# Patient Record
Sex: Female | Born: 1940
Health system: Southern US, Community
[De-identification: ages and names within clinical notes are randomized; demographics above are authoritative.]

## PROBLEM LIST (undated history)

## (undated) DIAGNOSIS — H269 Unspecified cataract: Secondary | ICD-10-CM

## (undated) DIAGNOSIS — R569 Unspecified convulsions: Secondary | ICD-10-CM

## (undated) DIAGNOSIS — F419 Anxiety disorder, unspecified: Secondary | ICD-10-CM

## (undated) DIAGNOSIS — F329 Major depressive disorder, single episode, unspecified: Secondary | ICD-10-CM

## (undated) DIAGNOSIS — M810 Age-related osteoporosis without current pathological fracture: Secondary | ICD-10-CM

## (undated) DIAGNOSIS — K5909 Other constipation: Secondary | ICD-10-CM

## (undated) DIAGNOSIS — F32A Depression, unspecified: Secondary | ICD-10-CM

## (undated) HISTORY — DX: Anxiety disorder, unspecified: F41.9

## (undated) HISTORY — PX: FISSURECTOMY: SHX5244

## (undated) HISTORY — DX: Unspecified convulsions: R56.9

## (undated) HISTORY — DX: Age-related osteoporosis without current pathological fracture: M81.0

## (undated) HISTORY — DX: Other constipation: K59.09

## (undated) HISTORY — DX: Unspecified cataract: H26.9

## (undated) HISTORY — PX: ESOPHAGOGASTRODUODENOSCOPY: SHX1529

## (undated) HISTORY — DX: Major depressive disorder, single episode, unspecified: F32.9

## (undated) HISTORY — DX: Depression, unspecified: F32.A

## (undated) HISTORY — PX: WRIST SURGERY: SHX841

---

## 1993-02-17 HISTORY — PX: ABDOMINAL HYSTERECTOMY: SHX81

## 2010-02-17 HISTORY — PX: COLONOSCOPY: SHX174

## 2014-08-24 ENCOUNTER — Ambulatory Visit (INDEPENDENT_AMBULATORY_CARE_PROVIDER_SITE_OTHER): Payer: Medicare Other | Admitting: Physician Assistant

## 2014-08-24 VITALS — BP 108/66 | HR 74 | Temp 97.8°F | Resp 16 | Ht 67.5 in | Wt 108.6 lb

## 2014-08-24 DIAGNOSIS — G4700 Insomnia, unspecified: Secondary | ICD-10-CM | POA: Insufficient documentation

## 2014-08-24 DIAGNOSIS — B0229 Other postherpetic nervous system involvement: Secondary | ICD-10-CM | POA: Diagnosis not present

## 2014-08-24 DIAGNOSIS — G40909 Epilepsy, unspecified, not intractable, without status epilepticus: Secondary | ICD-10-CM | POA: Diagnosis not present

## 2014-08-24 MED ORDER — MELOXICAM 7.5 MG PO TABS: 7.5000 mg | ORAL_TABLET | Freq: Every day | ORAL | Status: DC

## 2014-08-24 MED ORDER — LIDOCAINE 5 % EX PTCH: 1.0000 | MEDICATED_PATCH | CUTANEOUS | Status: DC

## 2014-08-24 NOTE — Patient Instructions (Signed)
Take tylenol 1000 mg three times a day. Take mobic once a day. Apply lidoderm patch to most painful area and leave in place for 12 hours. Remove and give yourself a 12 hour break before applying a new patch. Increase your klonopin to 3/4 - 1 pill at night to help you sleep. Return in 1 month for recheck.

## 2014-08-24 NOTE — Progress Notes (Signed)
[facility] [address], [place] [id number] [phone]  Date:  08/24/2014   Name:  Erin ChickBennie Penson   DOB:  [birth year]   MRN:  [id number]  PCP:  Albertina SenegalPOLLOCK, NELSON, MD    Chief Complaint: other   History of Present Illness:  This is a 74 y.o. female with PMH epilepsy who is presenting complaining of residual nerve pain since being dx'd with shingles on 08/01/14. She finished the valtrex and the rash has is resolving but she continues to have pain. Pain is located to left groin and extends to posterior left leg and left side of back. She is getting burning pain into her rectum and vagina. She states sitting down feels like she is "sitting on a nail". She is having a hard time staying asleep d/t the pain. She takes 0.25 - 0.5 mg klonopin at night normally to help her sleep. She is scared to take more than because she takes care of her husband with Parkinson's disease and she does not want to sleep to hard in case he calls out in the night. She saw her PCP who prescribed gabapentin for her nerve pain. She is very wary of taking this since she has a dx of epilepsy and already takes carbamazepine 300 mg QHS. Her epilepsy is very well controlled and she does not want to mess with her meds. In addition, she believes she took gabapentin in 2013 and she had side effects to it (insomnia). She states she cannot take opioids as they make her nauseated and vomit. She has been taking tylenol 2 gm per day for her current pain. Pt is very saddened by her current state - she states "I have always been able to take care of myself. Leaning on people for help is hard for me". She is accompanied today by neighbor.  Review of Systems:  Review of Systems See HPI.  There are no active problems to display for this patient.   Prior to Admission medications   Medication Sig Start Date End Date Taking? Authorizing Provider  carbamazepine (TEGRETOL) 200 MG tablet Take 300 mg by mouth at bedtime.    Yes Historical Provider, MD  clonazePAM (KLONOPIN) 1 MG tablet Take 1 mg by mouth at bedtime. Takes 1/2 of pill   Yes Historical Provider, MD  PEG 3350-KCl-Na Bicarb-NaCl (NULYTELY PO) Take 4 oz by mouth.   Yes Historical Provider, MD  Vitamin D, Ergocalciferol, (DRISDOL) 50000 UNITS CAPS capsule Take 50,000 Units by mouth every 7 (seven) days.   Yes Historical Provider, MD    Allergies  Allergen Reactions  . Other Nausea Only    Pain medications, Pt states all kinds of pain medications she is unable to take except tylenol    Past Surgical History  Procedure Laterality Date  . Abdominal hysterectomy      History  Substance Use Topics  . Smoking status: Current Every Day Smoker -- 1.00 packs/day    Types: Cigarettes  . Smokeless tobacco: Not on file  . Alcohol Use: No    History reviewed. No pertinent family history.  Medication list has been reviewed and updated.  Physical Examination:  Physical Exam  Constitutional: She is oriented to person, place, and time. She appears well-developed and well-nourished. No distress.  HENT:  Head: Normocephalic and atraumatic.  Right Ear: Hearing normal.  Left Ear: Hearing normal.  Nose: Nose normal.  Eyes: Conjunctivae and lids are normal. Right eye exhibits no discharge. Left eye exhibits no  discharge. No scleral icterus.  Cardiovascular: Normal rate, regular rhythm and normal pulses.   Pulmonary/Chest: Effort normal. No respiratory distress.  Musculoskeletal: Normal range of motion.  Neurological: She is alert and oriented to person, place, and time.  Skin: Skin is warm, dry and intact.  Scattered scabbed lesions over left groin, left buttock and left proximal posterior thigh  Psychiatric: She has a normal mood and affect. Her speech is normal and behavior is normal. Thought content normal.   BP 108/66 mmHg  Pulse 74  Temp(Src) 97.8 F (36.6 C) (Oral)  Resp 16  Ht 5' 7.5" (1.715 m)  Wt 108 lb 9.6 oz (49.261 kg)  BMI 16.75  kg/m2  SpO2 98%  Assessment and Plan:  1. Postherpetic neuralgia 2. Epilepsy 3. insomnia Pt with resolving herpetic lesions but with residual nerve pain. She will not take the gabapentin that was prescribed by her PCP since side effects in the past and dx of epilepsy. She cannot tolerate narcotics. She does not have a history of kidney dz or GI bleed. She will take tylenol 1 gm TID, meloxicam 7.5 mg QD and lidoderm patch q 12 hours for pain. She will apply the lidoderm patch at night since most bothered by pain at night. Can increase klonopin to 0.75 - 1 mg if needed for sleep. Return in 1 month for follow up. - lidocaine (LIDODERM) 5 %; Place 1 patch onto the skin daily. Remove & Discard patch within 12 hours or as directed by MD  Dispense: 30 patch; Refill: 0 - meloxicam (MOBIC) 7.5 MG tablet; Take 1 tablet (7.5 mg total) by mouth daily.  Dispense: 30 tablet; Refill: 1   Diannie Willner V. Dyke Brackett, MHS [facility]  08/24/2014

## 2014-08-25 ENCOUNTER — Telehealth: Payer: Self-pay

## 2014-08-25 NOTE — Telephone Encounter (Signed)
Pt was given a pain patch and was told that she should be able to wear it 12hr on 12 off but after 5hrs it started to burn   What should pt do next

## 2014-08-25 NOTE — Telephone Encounter (Signed)
I am not sure if this is a side effect.

## 2014-08-26 NOTE — Telephone Encounter (Signed)
This is a potential side effect of the patch. She can continue to try and use it if the burning sensation has abated. If not, she should stop this and let us know; however, there aren't alternatives. If she gets hives, redness, rashes, fevers, she should stop immediately and come in for an evaluation. Thank you!

## 2014-08-29 ENCOUNTER — Ambulatory Visit (INDEPENDENT_AMBULATORY_CARE_PROVIDER_SITE_OTHER): Payer: Medicare Other | Admitting: Physician Assistant

## 2014-08-29 VITALS — BP 120/60 | HR 80 | Temp 97.9°F | Resp 16 | Ht 65.0 in | Wt 108.0 lb

## 2014-08-29 DIAGNOSIS — K6289 Other specified diseases of anus and rectum: Secondary | ICD-10-CM

## 2014-08-29 DIAGNOSIS — G4700 Insomnia, unspecified: Secondary | ICD-10-CM | POA: Diagnosis not present

## 2014-08-29 DIAGNOSIS — B0229 Other postherpetic nervous system involvement: Secondary | ICD-10-CM

## 2014-08-29 DIAGNOSIS — R1013 Epigastric pain: Secondary | ICD-10-CM

## 2014-08-29 DIAGNOSIS — R6889 Other general symptoms and signs: Secondary | ICD-10-CM

## 2014-08-29 LAB — POCT CBC
Granulocyte percent: 50.3 %G (ref 37–80)
HCT, POC: 43.8 % (ref 37.7–47.9)
Hemoglobin: 14.3 g/dL (ref 12.2–16.2)
LYMPH, POC: 2 (ref 0.6–3.4)
MCH, POC: 31.5 pg — AB (ref 27–31.2)
MCHC: 32.7 g/dL (ref 31.8–35.4)
MCV: 96.4 fL (ref 80–97)
MID (CBC): 0.5 (ref 0–0.9)
MPV: 7.9 fL (ref 0–99.8)
PLATELET COUNT, POC: 201 10*3/uL (ref 142–424)
POC GRANULOCYTE: 2.5 (ref 2–6.9)
POC LYMPH PERCENT: 40.2 %L (ref 10–50)
POC MID %: 9.5 %M (ref 0–12)
RBC: 4.54 M/uL (ref 4.04–5.48)
RDW, POC: 15.4 %
WBC: 4.9 10*3/uL (ref 4.6–10.2)

## 2014-08-29 LAB — COMPREHENSIVE METABOLIC PANEL
ALT: 17 U/L (ref 0–35)
AST: 22 U/L (ref 0–37)
Albumin: 4.4 g/dL (ref 3.5–5.2)
Alkaline Phosphatase: 39 U/L (ref 39–117)
BILIRUBIN TOTAL: 0.4 mg/dL (ref 0.2–1.2)
BUN: 18 mg/dL (ref 6–23)
CO2: 30 mEq/L (ref 19–32)
Calcium: 10 mg/dL (ref 8.4–10.5)
Chloride: 102 mEq/L (ref 96–112)
Creat: 0.7 mg/dL (ref 0.50–1.10)
Glucose, Bld: 74 mg/dL (ref 70–99)
Potassium: 3.9 mEq/L (ref 3.5–5.3)
Sodium: 142 mEq/L (ref 135–145)
TOTAL PROTEIN: 6.6 g/dL (ref 6.0–8.3)

## 2014-08-29 MED ORDER — DOCUSATE SODIUM 100 MG PO CAPS: 100.0000 mg | ORAL_CAPSULE | Freq: Two times a day (BID) | ORAL | Status: DC

## 2014-08-29 MED ORDER — HYDROCORTISONE ACETATE 25 MG RE SUPP: 25.0000 mg | Freq: Two times a day (BID) | RECTAL | Status: DC

## 2014-08-29 MED ORDER — DICLOFENAC SODIUM 1 % TD GEL: 2.0000 g | Freq: Four times a day (QID) | TRANSDERMAL | Status: DC

## 2014-08-29 NOTE — Telephone Encounter (Signed)
Left message for pt to call back  °

## 2014-08-29 NOTE — Telephone Encounter (Signed)
Patient returned call. Also has some concerns about the cost of her prescription.

## 2014-08-29 NOTE — Patient Instructions (Addendum)
You can apply voltaren gel up to 4 times a day. Stop mobic. Keep taking tylenol 1000 mg three times a day. Take klonopin 1 mg at night. Take melatonin up to 10 mg at night (start with 5-6 mg) along with your klonopin. You can you anusol suppositories up to twice a day if needed. Take colace 1-2 times a day to help soften your stool. Keep your appointment to return for follow up.

## 2014-08-29 NOTE — Progress Notes (Signed)
[facility] [address], [place] [address] [phone]  Date:  08/29/2014   Name:  Erin Ochoa   DOB:  11-Sep-1940   MRN:  [id number]  PCP:  Albertina Senegal, MD    Chief Complaint: Burn and Pain   History of Present Illness:  This is a 74 y.o. female with PMH epilepsy and insomnia who is presenting for follow up post-herpetic neuralgia. I saw her 5 days ago. At that time she was 2.5 weeks post shingles outbreak on her left groin, left buttock and left posterior thigh. Lesions were healing but still with burning pain keeping her awake at night. She cannot take gabapentin d/t previous side effects and cannot tolerate any narcotic. I prescribed lidocaine patch and mobic. She was to take increase tylenol to 1 gm TID and increase her klonopin from 0.5 mg to 1 mg QHS. She called the next day stating the lidocaine patch started to burn 5 hours after application. She tried cutting the patch in half and in quarters but still caused burning pain. She eventually stopped using it. She has been taking mobic daily. She states she is only sleeping about 2 hours a night. She falls asleep at 1 am and wakes around 3 am. She wakes with burning pain and starts pacing. She does note the pain in her vagina and rectum is starting to subside, although still present. She is also noting some abdominal pain and nausea. She has not vomited. This was present prior to 5 days ago but was not present pre-shingles. Pt is tearful frequently during the visit. She so badly wants to be the sole caretaker for her husband with parkinson's disease but is finding herself overwhelmed d/t her pain. She lives in a community with a skilled nursing facility. She has the option to admit her husband to facility or to have more help in her home but letting go of her responsibility is hard for her.  Pt notes she is cold all the time. No history thyroid problems.  Review of Systems:  Review of Systems  Constitutional:  Positive for fatigue. Negative for fever and chills.  Gastrointestinal: Positive for nausea, abdominal pain, constipation and rectal pain. Negative for vomiting, diarrhea, blood in stool and anal bleeding.  Genitourinary: Positive for vaginal pain.  Skin: Positive for rash.  Hematological: Negative for adenopathy.  Psychiatric/Behavioral: Positive for sleep disturbance.    Patient Active Problem List   Diagnosis Date Noted  . Epilepsy without status epilepticus, not intractable 08/24/2014  . Postherpetic neuralgia 08/24/2014  . Insomnia 08/24/2014    Prior to Admission medications   Medication Sig Start Date End Date Taking? Authorizing Provider  carbamazepine (TEGRETOL) 200 MG tablet Take 300 mg by mouth at bedtime.   Yes Historical Provider, MD  clonazePAM (KLONOPIN) 1 MG tablet Take 1 mg by mouth at bedtime. Takes 1/2 of pill   Yes Historical Provider, MD  meloxicam (MOBIC) 7.5 MG tablet Take 1 tablet (7.5 mg total) by mouth daily. 08/24/14  Yes Lanier Clam V, PA-C  PEG 3350-KCl-Na Bicarb-NaCl (NULYTELY PO) Take 4 oz by mouth.   Yes Historical Provider, MD  Vitamin D, Ergocalciferol, (DRISDOL) 50000 UNITS CAPS capsule Take 50,000 Units by mouth every 7 (seven) days.   Yes Historical Provider, MD           Allergies  Allergen Reactions  . Other Nausea Only    Pain medications, Pt states all kinds of pain medications she is unable to take  except tylenol    Past Surgical History  Procedure Laterality Date  . Abdominal hysterectomy      History  Substance Use Topics  . Smoking status: Current Every Day Smoker -- 1.00 packs/day    Types: Cigarettes  . Smokeless tobacco: Not on file  . Alcohol Use: No    History reviewed. No pertinent family history.  Medication list has been reviewed and updated.  Physical Examination:  Physical Exam  Constitutional: She is oriented to person, place, and time. She appears well-developed and well-nourished. No distress.  HENT:  Head:  Normocephalic and atraumatic.  Right Ear: Hearing normal.  Left Ear: Hearing normal.  Nose: Nose normal.  Eyes: Conjunctivae and lids are normal. Right eye exhibits no discharge. Left eye exhibits no discharge. No scleral icterus.  Pulmonary/Chest: Effort normal. No respiratory distress.  Genitourinary:  Pt declined GU/rectal exam  Musculoskeletal: Normal range of motion.  Neurological: She is alert and oriented to person, place, and time.  Skin: Skin is warm, dry and intact.  Psychiatric: Her speech is normal and behavior is normal. Thought content normal. Her mood appears anxious.  Tearful at times   BP 120/60 mmHg  Pulse 80  Temp(Src) 97.9 F (36.6 C) (Oral)  Resp 16  Ht  (1.651 m)  Wt 108 lb (48.988 kg)  BMI 17.97 kg/m2  SpO2 98%  Results for orders placed or performed in visit on 08/29/14  POCT CBC  Result Value Ref Range   WBC 4.9 4.6 - 10.2 K/uL   Lymph, poc 2.0 0.6 - 3.4   POC LYMPH PERCENT 40.2 10 - 50 %L   MID (cbc) 0.5 0 - 0.9   POC MID % 9.5 0 - 12 %M   POC Granulocyte 2.5 2 - 6.9   Granulocyte percent 50.3 37 - 80 %G   RBC 4.54 4.04 - 5.48 M/uL   Hemoglobin 14.3 12.2 - 16.2 g/dL   HCT, POC 16.1 09.6 - 47.9 %   MCV 96.4 80 - 97 fL   MCH, POC 31.5 (A) 27 - 31.2 pg   MCHC 32.7 31.8 - 35.4 g/dL   RDW, POC 04.5 %   Platelet Count, POC 201 142 - 424 K/uL   MPV 7.9 0 - 99.8 fL   Assessment and Plan:  1. Post herpetic neuralgia 2. Rectal pain 3. Insomnia 4. Epigastric pain Unfortunately, pt unable to tolerate lidoderm patches. Will also have her stop mobic d/t worsening abdominal pain - possible she is developing gastritis vs ulcer. Also possible that she is having abdominal pain and nausea d/t stress and pain. CBC wnl. She will continue tylenol 1 gm TID. She will try diclofenac gel and see if this is helpful. Colace and anusol as adjuncts for rectal pain. CMP pending. Pt will continue klonopin 1 mg QHS. She will also add melatonin 5 mg. Advised she  consider getting help with her husband esp at night. Pt is worried her husband will wake and need her but she will be sleeping. I suspect having someone there if her husband needed it would allow her to sleep more stress-free. She will return for follow up in 3 weeks. Call with any problems.  - diclofenac sodium (VOLTAREN) 1 % GEL; Apply 2 g topically 4 (four) times daily.  Dispense: 100 g; Refill: 1 - docusate sodium (COLACE) 100 MG capsule; Take 1 capsule (100 mg total) by mouth 2 (two) times daily.  Dispense: 10 capsule; Refill: 0 - hydrocortisone (ANUSOL-HC) 25 MG suppository;  Place 1 suppository (25 mg total) rectally 2 (two) times daily.  Dispense: 12 suppository; Refill: 0 - POCT CBC - Comprehensive metabolic panel  5. Cold intolerance - TSH   Roswell MinersNicole V. Dyke BrackettBush, PA-C, MHS [facility]  08/29/2014

## 2014-08-30 LAB — TSH: TSH: 1.624 u[IU]/mL (ref 0.350–4.500)

## 2014-08-30 NOTE — Telephone Encounter (Signed)
Spoke with pt, she was in yesterday to be seen.

## 2014-09-26 ENCOUNTER — Ambulatory Visit (INDEPENDENT_AMBULATORY_CARE_PROVIDER_SITE_OTHER): Payer: Medicare Other | Admitting: Physician Assistant

## 2014-09-26 ENCOUNTER — Encounter: Payer: Self-pay | Admitting: Physician Assistant

## 2014-09-26 VITALS — BP 101/63 | HR 80 | Temp 98.3°F | Resp 16 | Ht 65.0 in | Wt 107.8 lb

## 2014-09-26 DIAGNOSIS — B0229 Other postherpetic nervous system involvement: Secondary | ICD-10-CM | POA: Diagnosis not present

## 2014-09-26 DIAGNOSIS — R1013 Epigastric pain: Secondary | ICD-10-CM | POA: Diagnosis not present

## 2014-09-26 DIAGNOSIS — Z636 Dependent relative needing care at home: Secondary | ICD-10-CM

## 2014-09-26 DIAGNOSIS — G4700 Insomnia, unspecified: Secondary | ICD-10-CM

## 2014-09-26 NOTE — Patient Instructions (Addendum)
Take 1 mg melatonin with 1/2 mg klonopin at night. Order the chair. Buy a gait belt. See about getting your husband physical therapy. Tylenol as needed for pain.

## 2014-09-26 NOTE — Progress Notes (Signed)
[facility] [address], [place] [address] [phone]  Date:  09/26/2014   Name:  Erin Ochoa   DOB:  06/17/1940   MRN:  [id number]  PCP:  Albertina Senegal, MD    Chief Complaint: Follow-up   History of Present Illness:  This is a 74 y.o. female with PMH epilepsy who is presenting for follow up shingles. Initial outbreak 6-7 weeks ago. Outbreak in left groin and left posterior thigh. She was having severe rectal and vaginal pain with the outbreak. Pt unable to take gabapentin or narcotics. She was prescribed lidoderm but unable to tolerate d/t burning sensation. When last saw pt 3 weeks ago prescribed voltaren gel. She reports today she was also unable to tolerate voltaren gel d/t burning sensation. She states the pain is much improved but not resolved. She is no longer being woken in the night from pain. She is getting about 6 hours of sleep a night (was getting 2-3) but sleep is often disturbed from her husband calling for her. She is now taking tylenol 1000 mg only once a day compared to TID. When I last saw her she was having some intermittent epigastric pain. She is still having this pain but less since cutting back on tylenol. CBC, CMP and TSH 3 weeks ago normal. No bloody stool, n/v/d.  Pt states she has ordered a lift chair from [employer] for her husband. Right now she is hurting her back trying to get her husband out of bed. She states she has him hold onto a towel and she pulls as hard as she can to get him to sit up in bed. They live at [facility] at [place]. She is reluctant to have anyone help them in the home - she is worried this would upset her husband. I have offered to call and see who can help but this makes her very upset and tearful. Her husband has Parkinson's and from what she is telling me his condition is worsening.  Review of Systems:  Review of Systems See HPI  Patient Active Problem List   Diagnosis Date Noted  .  Epilepsy without status epilepticus, not intractable 08/24/2014  . Postherpetic neuralgia 08/24/2014  . Insomnia 08/24/2014    Prior to Admission medications   Medication Sig Start Date End Date Taking? Authorizing Provider  carbamazepine (TEGRETOL) 200 MG tablet Take 300 mg by mouth at bedtime.   Yes Historical Provider, MD  clonazePAM (KLONOPIN) 1 MG tablet Take 1 mg by mouth at bedtime. Takes 1/2 of pill   Yes Historical Provider, MD  diclofenac sodium (VOLTAREN) 1 % GEL Apply 2 g topically 4 (four) times daily. 08/29/14  Yes Lanier Clam V, PA-C  docusate sodium (COLACE) 100 MG capsule Take 1 capsule (100 mg total) by mouth 2 (two) times daily. 08/29/14  Yes Dorna Leitz, PA-C  hydrocortisone (ANUSOL-HC) 25 MG suppository Place 1 suppository (25 mg total) rectally 2 (two) times daily. 08/29/14  Yes Nicole Bush V, PA-C  PEG 3350-KCl-Na Bicarb-NaCl (NULYTELY PO) Take 4 oz by mouth.   Yes Historical Provider, MD  Vitamin D, Ergocalciferol, (DRISDOL) 50000 UNITS CAPS capsule Take 50,000 Units by mouth every 7 (seven) days.   Yes Historical Provider, MD    Allergies  Allergen Reactions  . Other Nausea Only    Pain medications, Pt states all kinds of pain medications she is unable to take except tylenol    Past Surgical History  Procedure Laterality Date  .  Abdominal hysterectomy      History  Substance Use Topics  . Smoking status: Current Every Day Smoker -- 1.00 packs/day    Types: Cigarettes  . Smokeless tobacco: Not on file  . Alcohol Use: No    History reviewed. No pertinent family history.  Medication list has been reviewed and updated.  Physical Examination:  Physical Exam  Constitutional: She is oriented to person, place, and time. She appears well-developed and well-nourished. No distress.  HENT:  Head: Normocephalic and atraumatic.  Right Ear: Hearing normal.  Left Ear: Hearing normal.  Nose: Nose normal.  Eyes: Conjunctivae and lids are normal. Right eye  exhibits no discharge. Left eye exhibits no discharge. No scleral icterus.  Pulmonary/Chest: Effort normal. No respiratory distress.  Musculoskeletal: Normal range of motion.  Neurological: She is alert and oriented to person, place, and time.  Skin: Skin is warm, dry and intact. No lesion and no rash noted.  Psychiatric: She has a normal mood and affect. Her speech is normal and behavior is normal. Thought content normal.   BP 101/63 mmHg  Pulse 80  Temp(Src) 98.3 F (36.8 C) (Oral)  Resp 16  Ht  (1.651 m)  Wt 107 lb 12.8 oz (48.898 kg)  BMI 17.94 kg/m2  SpO2 96%  Assessment and Plan:  1. Post herpetic neuralgia 2. Insomnia 3. Epigastric pain 4. Caregiver stress Post-herpetic neuralgia improving but not resolved. Continue tylenol prn, no longer scheduled TID. Sleep has improved. She will cut down from 1 mg klonopin to 1/2 mg. She may take 1 mg melatonin with this. Pt is very stressed with her caregiver responsibilities but is unwilling to delegate to anyone else. She lives at a facility that could provide extra care but she is worried a new face would upset her husband. I offered to call the facility and help set up some extra care but pt does not want me to do this now. She has ordered a lift chair to take physical stress off her and I advised she buy a gait belt to help with transfers. She will return in 2-3 months or sooner if needed. If still having trouble with her husband at that time hopefully she will be agreeable to getting extra help then.   Roswell Miners Dyke Brackett, MHS [facility]  09/26/2014

## 2015-08-14 DIAGNOSIS — Z1231 Encounter for screening mammogram for malignant neoplasm of breast: Secondary | ICD-10-CM | POA: Diagnosis not present

## 2015-08-14 DIAGNOSIS — Z139 Encounter for screening, unspecified: Secondary | ICD-10-CM | POA: Diagnosis not present

## 2015-08-14 DIAGNOSIS — Z780 Asymptomatic menopausal state: Secondary | ICD-10-CM | POA: Diagnosis not present

## 2015-08-14 DIAGNOSIS — M810 Age-related osteoporosis without current pathological fracture: Secondary | ICD-10-CM | POA: Diagnosis not present

## 2015-08-14 LAB — HM MAMMOGRAPHY

## 2015-08-14 LAB — HM DEXA SCAN

## 2015-09-27 DIAGNOSIS — M810 Age-related osteoporosis without current pathological fracture: Secondary | ICD-10-CM | POA: Diagnosis not present

## 2015-10-09 DIAGNOSIS — M810 Age-related osteoporosis without current pathological fracture: Secondary | ICD-10-CM | POA: Diagnosis not present

## 2015-10-25 DIAGNOSIS — H2513 Age-related nuclear cataract, bilateral: Secondary | ICD-10-CM | POA: Diagnosis not present

## 2015-11-29 DIAGNOSIS — Z23 Encounter for immunization: Secondary | ICD-10-CM | POA: Diagnosis not present

## 2016-01-24 DIAGNOSIS — E782 Mixed hyperlipidemia: Secondary | ICD-10-CM | POA: Diagnosis not present

## 2016-01-24 DIAGNOSIS — Z0000 Encounter for general adult medical examination without abnormal findings: Secondary | ICD-10-CM | POA: Diagnosis not present

## 2016-01-24 DIAGNOSIS — G4700 Insomnia, unspecified: Secondary | ICD-10-CM | POA: Diagnosis not present

## 2016-01-24 DIAGNOSIS — G40909 Epilepsy, unspecified, not intractable, without status epilepticus: Secondary | ICD-10-CM | POA: Diagnosis not present

## 2016-01-24 DIAGNOSIS — K5909 Other constipation: Secondary | ICD-10-CM | POA: Diagnosis not present

## 2016-01-24 DIAGNOSIS — K219 Gastro-esophageal reflux disease without esophagitis: Secondary | ICD-10-CM | POA: Insufficient documentation

## 2016-01-24 DIAGNOSIS — L719 Rosacea, unspecified: Secondary | ICD-10-CM | POA: Insufficient documentation

## 2016-01-24 DIAGNOSIS — M810 Age-related osteoporosis without current pathological fracture: Secondary | ICD-10-CM | POA: Diagnosis not present

## 2016-01-24 DIAGNOSIS — G40802 Other epilepsy, not intractable, without status epilepticus: Secondary | ICD-10-CM | POA: Diagnosis not present

## 2016-04-16 DIAGNOSIS — N390 Urinary tract infection, site not specified: Secondary | ICD-10-CM | POA: Diagnosis not present

## 2016-04-16 DIAGNOSIS — R350 Frequency of micturition: Secondary | ICD-10-CM | POA: Diagnosis not present

## 2016-04-19 DIAGNOSIS — N390 Urinary tract infection, site not specified: Secondary | ICD-10-CM | POA: Diagnosis not present

## 2016-09-25 ENCOUNTER — Ambulatory Visit: Payer: Self-pay | Admitting: Family Medicine

## 2016-10-02 ENCOUNTER — Ambulatory Visit (INDEPENDENT_AMBULATORY_CARE_PROVIDER_SITE_OTHER): Payer: Medicare Other | Admitting: Family Medicine

## 2016-10-02 ENCOUNTER — Encounter: Payer: Self-pay | Admitting: Family Medicine

## 2016-10-02 VITALS — BP 84/60 | HR 80 | Temp 98.3°F | Ht 65.0 in | Wt 114.4 lb

## 2016-10-02 DIAGNOSIS — Z7689 Persons encountering health services in other specified circumstances: Secondary | ICD-10-CM | POA: Diagnosis not present

## 2016-10-02 DIAGNOSIS — K5900 Constipation, unspecified: Secondary | ICD-10-CM | POA: Diagnosis not present

## 2016-10-02 DIAGNOSIS — G40909 Epilepsy, unspecified, not intractable, without status epilepticus: Secondary | ICD-10-CM | POA: Diagnosis not present

## 2016-10-02 DIAGNOSIS — F518 Other sleep disorders not due to a substance or known physiological condition: Secondary | ICD-10-CM

## 2016-10-02 NOTE — Progress Notes (Signed)
HPI:  Sheyna Pettibone is here to establish care. Used to see a primary care doctor in [facility], but needs a primary care physician closer to home. When it to see Dr. Durene Cal, but he was not accepting new patients. Last PCP and physical: Reports she was getting a Pap smear and physical yearly. Reports all normal Pap smears in the past and does not want to do further as her insurance did not pay for it.  Has the following chronic problems that require follow up and concerns today:  Constipation: -Reports severe -Reports had surgery for hemorrhoids and rectal tear due to the constipation -Reports Dr. Earlene Plater, a surgeon put her on 2 ounces of Nulytely daily which she has been on for many years -Reports tried everything else and nothing worked -Reports she cannot go regularly without taking this and that she was told she could continue it forever - reports"with die"without this regimen  Seizure disorder/" sleep/drinking episodes": -Chronic since she was in high school -History of epilepsy -Reports episodes of dreaming while awake since high school -Saw a neurologist in [place] -Treatments include: 300 mg daily of Tegretol, Klonopin 0.5 mg nightly   Osteoporosis: -On reclast, reports gets injections every other year  Bereavement: -Husband passed in 2017, they were best friends -She lives at [employer] friend's home -She was doing cognitive behavioral therapy, has good friends and support -Does not feel she needs further treatment currently  ROS negative for unless reported above: fevers, unintentional weight loss, hearing or vision loss, chest pain, palpitations, struggling to breath, hemoptysis, melena, hematochezia, hematuria, falls, loc, si, thoughts of self harm  Past Medical History:  Diagnosis Date  . Osteoporosis   . Seizures ([name])     Past Surgical History:  Procedure Laterality Date  . ABDOMINAL HYSTERECTOMY    . FISSURECTOMY    . WRIST SURGERY Left    due to fracture     Family History  Problem Relation Age of Onset  . Alcohol abuse Father   . Diabetes Father   . Diabetes Maternal Grandmother   . Diabetes Paternal Grandmother     Social History   Social History  . Marital status: Married    Spouse name: N/A  . Number of children: N/A  . Years of education: N/A   Social History Main Topics  . Smoking status: Current Every Day Smoker    Packs/day: 1.00    Types: Cigarettes  . Smokeless tobacco: Not on file  . Alcohol use No  . Drug use: No  . Sexual activity: Not on file   Other Topics Concern  . Not on file   Social History Narrative  . No narrative on file     Current Outpatient Prescriptions:  .  CALCIUM PO, Take 600 mg by mouth 2 (two) times daily., Disp: , Rfl:  .  carbamazepine (TEGRETOL) 200 MG tablet, Take 300 mg by mouth at bedtime., Disp: , Rfl:  .  clonazePAM (KLONOPIN) 1 MG tablet, Take 1 mg by mouth at bedtime. Takes 1/2 of pill, Disp: , Rfl:  .  PEG 3350-KCl-Na Bicarb-NaCl (NULYTELY PO), Take 2.5 oz by mouth. , Disp: , Rfl:  .  vitamin C (ASCORBIC ACID) 500 MG tablet, Take 500 mg by mouth daily., Disp: , Rfl:  .  Vitamin D, Ergocalciferol, (DRISDOL) 50000 UNITS CAPS capsule, Take 50,000 Units by mouth every 7 (seven) days., Disp: , Rfl:  .  vitamin E 400 UNIT capsule, Take 400 Units by mouth daily., Disp: ,  Rfl:  .  zoledronic acid (RECLAST) 5 MG/100ML SOLN injection, Inject 5 mg into the vein once., Disp: , Rfl:   EXAM:  Vitals:   10/02/16 1311  BP: (!) 84/60  Pulse: 80  Temp: 98.3 F (36.8 C)    Body mass index is 19.04 kg/m.  GENERAL: vitals reviewed and listed above, alert, oriented, appears well hydrated and in no acute distress  HEENT: atraumatic, conjunttiva clear, no obvious abnormalities on inspection of external nose and ears  NECK: no obvious masses on inspection  LUNGS: clear to auscultation bilaterally, no wheezes, rales or rhonchi, good air movement  CV: HRRR, no peripheral edema  MS:  moves all extremities without noticeable abnormality  PSYCH: pleasant and cooperative, no obvious depression or anxiety  ASSESSMENT AND PLAN:  Discussed the following assessment and plan:  Seizure disorder ([name]) - Plan: Ambulatory referral to Neurology Abnormal dreams - Plan: Ambulatory referral to Neurology -Referral to neurology for management -Did advise her that I do not prescribe controlled substances for regular use in adult patients - she describes episodes of dreaming while awake if she has a bad night of not sleeping, reports she is on the Klonopin for this and seizures along with the Tegretol - will defer to neurology to continue if they feel that this is needed and appropriate treatment of these disorders if they feel comfortable prescribing  Constipation, unspecified constipation type -Interesting regimen, but she does feel that this is the only thing that will control her constipation -Seems has been working for many years  Establishing care with new doctor, encounter for -We reviewed the PMH, PSH, FH, SH, Meds and Allergies. -We provided refills for any medications we will prescribe as needed. -We addressed current concerns per orders and patient instructions. -We have asked for records for pertinent exams, studies, vaccines and notes from previous providers. -We have advised patient to follow up per instructions below.  Physical and wellness visit in 2-3 months We'll plan to do labs then and discuss her preventive measures further. She is not sure that she wishes to continue with cancer screenings. Will discuss further with her health coach at annual wellness visit.  -Patient advised to return or notify a doctor immediately if symptoms worsen or persist or new concerns arise.  Patient Instructions  BEFORE YOU LEAVE: -follow up: AWV with susan and follow up with Dr. Selena BattenKim - same day in 2-3 months  -We placed a referral for you as discussed to the neurologist about the  seizure disorder and sleep episodes. It usually takes about 1-2 weeks to process and schedule this referral. If you have not heard from us regarding this appointment in 2 weeks please contact our office.  WE NOW OFFER   [facility] FAST TRACK!!!  SAME DAY Appointments for ACUTE CARE  Such as: Sprains, Injuries, cuts, abrasions, rashes, muscle pain, joint pain, back pain Colds, flu, sore throats, headache, allergies, cough, fever  Ear pain, sinus and eye infections Abdominal pain, nausea, vomiting, diarrhea, upset stomach Animal/insect bites  3 Easy Ways to Schedule: Walk-In Scheduling Call in scheduling Mychart Sign-up: [email or web address]              Kriste BasqueKIM, Swan Zayed R.

## 2016-10-02 NOTE — Patient Instructions (Signed)
BEFORE YOU LEAVE: -follow up: AWV with susan and follow up with Dr. Selena BattenKim - same day in 2-3 months  -We placed a referral for you as discussed to the neurologist about the seizure disorder and sleep episodes. It usually takes about 1-2 weeks to process and schedule this referral. If you have not heard from us regarding this appointment in 2 weeks please contact our office.  WE NOW OFFER   [facility] FAST TRACK!!!  SAME DAY Appointments for ACUTE CARE  Such as: Sprains, Injuries, cuts, abrasions, rashes, muscle pain, joint pain, back pain Colds, flu, sore throats, headache, allergies, cough, fever  Ear pain, sinus and eye infections Abdominal pain, nausea, vomiting, diarrhea, upset stomach Animal/insect bites  3 Easy Ways to Schedule: Walk-In Scheduling Call in scheduling Mychart Sign-up: [email or web address]

## 2016-10-07 ENCOUNTER — Encounter: Payer: Self-pay | Admitting: Family Medicine

## 2016-10-29 ENCOUNTER — Encounter: Payer: Self-pay | Admitting: *Deleted

## 2016-10-30 ENCOUNTER — Ambulatory Visit: Payer: Medicare Other | Admitting: Nurse Practitioner

## 2016-10-30 ENCOUNTER — Encounter: Payer: Self-pay | Admitting: Nurse Practitioner

## 2016-10-30 DIAGNOSIS — H269 Unspecified cataract: Secondary | ICD-10-CM | POA: Insufficient documentation

## 2016-10-30 DIAGNOSIS — K5900 Constipation, unspecified: Secondary | ICD-10-CM | POA: Diagnosis not present

## 2016-10-30 DIAGNOSIS — G40909 Epilepsy, unspecified, not intractable, without status epilepticus: Secondary | ICD-10-CM | POA: Diagnosis not present

## 2016-10-30 DIAGNOSIS — M810 Age-related osteoporosis without current pathological fracture: Secondary | ICD-10-CM

## 2016-10-30 DIAGNOSIS — Z9889 Other specified postprocedural states: Secondary | ICD-10-CM | POA: Insufficient documentation

## 2016-10-30 DIAGNOSIS — G4700 Insomnia, unspecified: Secondary | ICD-10-CM

## 2016-10-30 MED ORDER — VITAMIN D (ERGOCALCIFEROL) 1.25 MG (50000 UNIT) PO CAPS: 50000.0000 [IU] | ORAL_CAPSULE | ORAL | 2 refills | Status: DC

## 2016-10-30 NOTE — Assessment & Plan Note (Signed)
Osteoporosis: Reclast, Ca, Vit D, last DEXA<2 years, left wrist fx from fall.

## 2016-10-30 NOTE — Assessment & Plan Note (Signed)
Last seizure was in her 6420s, taking Carbamazepine 300mg  qhs(hx of Dilantin). Never have Carbamazepine level checked, no s/s of toxicity.

## 2016-10-30 NOTE — Assessment & Plan Note (Signed)
Cataracts f/u Ophthalmology, may needs surgery soon.

## 2016-10-30 NOTE — Assessment & Plan Note (Signed)
Lifelong issue until NULYTELY 4Oz daily per GI Dr.

## 2016-10-30 NOTE — Assessment & Plan Note (Signed)
Last Colonoscopy 2008, due for GI

## 2016-10-30 NOTE — Patient Instructions (Addendum)
Obtain last Colonoscopy and DEXA results. Last Reclast date.  The patient said she will request them from her previous MDs. CBC CMP TSH Tegretol level prior to the next appoint. Next appointment in 3 months.

## 2016-10-30 NOTE — Progress Notes (Addendum)
Provider:  Chipper OmanManXie Nation Cradle NP Location:   [facility] Place of Service:   Clinic  PCP: Oneal GroutPandey, Mahima, MD Patient Care Team: Oneal GroutPandey, Mahima, MD as PCP - General (Internal Medicine) Tanairy Payeur X, NP as Nurse Practitioner (Internal Medicine)  Extended Emergency Contact Information Primary Emergency Contact: Charlies ConstableNeal,[name]  [place] Mobile Phone: [phone] Relation: Friend  Code Status: DNR Goals of Care: Advanced Directive information Advanced Directives 12/11/2016  Does Patient Have a Medical Advance Directive? Yes  Type of Advance Directive Healthcare Power of Attorney  Does patient want to make changes to medical advance directive? No - Patient declined  Copy of Healthcare Power of Attorney in Chart? -      Chief Complaint  Patient presents with  . Establish Care    HPI: Patient is a 76 y.o. female seen today for admission to [facility] service.   She has history of seizures, last active seizure was in her 2920s, takes carbamazepine 300mg  po qhs, no drug level checked. Insomnia: sleeps better with Clonazepam 0.5mg  qhs, but reluctant to discontinue it since she worries her seizure may occur and she has early am awake when she reduced it to 0.25mg  po hs.    Osteoporosis: Reclast, Ca, Vit D, last DEXA  Last Colonoscopy 2008, due for GI  Cataracts f/u Ophthalmology, may needs surgery soon.   Past Medical History:  Diagnosis Date  . Osteoporosis   . Seizures ([name])    epilepsy   Past Surgical History:  Procedure Laterality Date  . ABDOMINAL HYSTERECTOMY  1995   Dr. Carlis AbbottVan Fletcher  . FISSURECTOMY    . WRIST SURGERY Left    due to fracture    reports that she quit smoking about 35 years ago. Her smoking use included Cigarettes. She started smoking about 60 years ago. She smoked 1.00 pack per day. She has quit using smokeless tobacco. She reports that she does not drink alcohol or use drugs. Social History   Social History  . Marital status:  Widowed    Spouse name: N/A  . Number of children: N/A  . Years of education: N/A   Occupational History  . Not on file.   Social History Main Topics  . Smoking status: Former Smoker    Packs/day: 1.00    Types: Cigarettes    Start date: 02/18/1956    Quit date: 02/17/1981  . Smokeless tobacco: Former NeurosurgeonUser  . Alcohol use No  . Drug use: No  . Sexual activity: Not on file   Other Topics Concern  . Not on file   Social History Narrative   Social History     Marital status: Widowed           Spouse name:                        Years of education:                 Number of children:0              Occupational History: Print production plannerffice Manager, Adm. Assistant, [employer]     None on file      Social History Main Topics     Smoking status: Former Smoker  Packs/day: 1.00      Years: 0.00            Types: Cigarettes        Smokeless tobacco: Not on file                        Alcohol use: No               Drug use: No               Sexual activity: Not on file            Does not drink caffeine, but does eat chocolate.   Does live in an apartment ([address]) alone   Does exercise : walks daily      Social History Narrative     None on file          Functional Status Survey:    Family History  Problem Relation Age of Onset  . Alcohol abuse Father   . Diabetes Father   . Diabetes Maternal Grandmother   . Diabetes Paternal Grandmother     Health Maintenance  Topic Date Due  . Janet Berlin  07/06/1959  . PNA vac Low Risk Adult (1 of 2 - PCV13) 07/05/2005  . INFLUENZA VACCINE  Completed  . DEXA SCAN  Completed    Allergies  Allergen Reactions  . Cefaclor Other (See Comments)    unknown  . Codeine Nausea Only  . Other Nausea Only    Unknown allergy Pain medications, Pt states all kinds of pain medications she is unable to take except tylenol  . Oxycodone-Acetaminophen Nausea And Vomiting  . Penicillins Itching     Allergies as of 10/30/2016      Reactions   Cefaclor Other (See Comments)   unknown   Codeine Nausea Only   Other Nausea Only   Unknown allergy Pain medications, Pt states all kinds of pain medications she is unable to take except tylenol   Oxycodone-acetaminophen Nausea And Vomiting   Penicillins Itching      Medication List       Accurate as of 10/30/16 11:59 PM. Always use your most recent med list.          CALCIUM PO Take 600 mg by mouth 2 (two) times daily.   carbamazepine 200 MG tablet Commonly known as:  TEGRETOL Take 300 mg by mouth at bedtime.   clonazePAM 1 MG tablet Commonly known as:  KLONOPIN Take 1 mg by mouth at bedtime. Takes 1/2 of pill   NULYTELY PO Take 2.5 oz by mouth.   RECLAST 5 MG/100ML Soln injection Generic drug:  zoledronic acid Inject 5 mg into the vein once.   vitamin C 500 MG tablet Commonly known as:  ASCORBIC ACID Take 500 mg by mouth daily.   Vitamin D (Ergocalciferol) 50000 units Caps capsule Commonly known as:  DRISDOL Take 1 capsule (50,000 Units total) by mouth every 7 (seven) days.   vitamin E 400 UNIT capsule Take 400 Units by mouth daily.       Review of Systems  Constitutional: Negative for activity change, appetite change, chills, diaphoresis, fatigue and fever.  HENT: Negative for hearing loss, trouble swallowing and voice change.   Eyes: Negative for pain, redness, itching and visual disturbance.       Cataract R+L  Respiratory: Negative for cough, choking, chest tightness and shortness of breath.   Cardiovascular: Negative for chest pain, palpitations and leg swelling.  Gastrointestinal: Negative for abdominal distention, abdominal pain, constipation, diarrhea and vomiting.  Endocrine: Negative for cold intolerance.  Genitourinary: Negative for difficulty urinating, dysuria, frequency, hematuria, urgency and vaginal bleeding.  Musculoskeletal: Negative for arthralgias, back pain, gait problem, joint  swelling, myalgias, neck pain and neck stiffness.  Skin: Negative for pallor, rash and wound.  Allergic/Immunologic: Negative for environmental allergies.  Neurological: Positive for seizures. Negative for tremors, weakness and headaches.  Psychiatric/Behavioral: Positive for sleep disturbance. Negative for agitation, behavioral problems and hallucinations. The patient is not nervous/anxious.     Vitals:   10/30/16 1354  BP: 110/60  Pulse: 81  Resp: 18  Temp: 97.9 F (36.6 C)  TempSrc: Oral  SpO2: 97%  Weight: 113 lb 12.8 oz (51.6 kg)  Height:  (1.651 m)   Body mass index is 18.94 kg/m. Physical Exam  Constitutional: She is oriented to person, place, and time. She appears well-developed and well-nourished. No distress.  HENT:  Head: Normocephalic and atraumatic.  Mouth/Throat: No oropharyngeal exudate.  Eyes: Pupils are equal, round, and reactive to light. Conjunctivae and EOM are normal.  cataracts  Neck: Normal range of motion. Neck supple. No thyromegaly present.  Cardiovascular: Normal rate, regular rhythm, normal heart sounds and intact distal pulses.   No murmur heard. Pulmonary/Chest: Effort normal and breath sounds normal. She has no wheezes. She has no rales.  Abdominal: Soft. Bowel sounds are normal. She exhibits no distension. There is no tenderness. There is no rebound.  Genitourinary: Rectal exam shows guaiac negative stool. No vaginal discharge found.  Musculoskeletal: Normal range of motion. She exhibits no edema or tenderness.  Lymphadenopathy:    She has no cervical adenopathy.  Neurological: She is alert and oriented to person, place, and time. She exhibits abnormal muscle tone. Coordination normal.  Skin: Skin is warm and dry. She is not diaphoretic.  Psychiatric: She has a normal mood and affect. Her behavior is normal. Judgment and thought content normal.    Labs reviewed: Basic Metabolic Panel: No results for input(s): NA, K, CL, CO2, GLUCOSE,  BUN, CREATININE, CALCIUM, MG, PHOS in the last 8760 hours. Liver Function Tests: No results for input(s): AST, ALT, ALKPHOS, BILITOT, PROT, ALBUMIN in the last 8760 hours. No results for input(s): LIPASE, AMYLASE in the last 8760 hours. No results for input(s): AMMONIA in the last 8760 hours. CBC: No results for input(s): WBC, NEUTROABS, HGB, HCT, MCV, PLT in the last 8760 hours. Cardiac Enzymes: No results for input(s): CKTOTAL, CKMB, CKMBINDEX, TROPONINI in the last 8760 hours. BNP: Invalid input(s): POCBNP No results found for: HGBA1C Lab Results  Component Value Date   TSH 1.624 08/29/2014   No results found for: VITAMINB12 No results found for: FOLATE No results found for: IRON, TIBC, FERRITIN  Imaging and Procedures obtained prior to SNF admission: Patient was never admitted.  Assessment/Plan  Constipation Lifelong issue until NULYTELY 4Oz daily per GI Dr.   Epilepsy without status epilepticus, not intractable Last seizure was in her 40s, taking Carbamazepine  qhs(hx of Dilantin). Never have Carbamazepine level checked, no s/s of toxicity.   Cataract Cataracts f/u Ophthalmology, may needs surgery soon.    H/O colonoscopy Last Colonoscopy 2008, due for GI   Osteoporosis Osteoporosis: Reclast, Ca, Vit D, last DEXA<2 years, left wrist fx from fall.    Insomnia Will continue Clonazepam 0.5mg  hs prn, may consider gradual dose reduction   Family/ staff Communication: plan of care reviewed with the aptient.   Labs/tests ordered: CBC CMP TSH Tegretol  level prior to the next appoint  Next appointment in 3 months.  Time spend 35 minutes

## 2016-10-31 NOTE — Assessment & Plan Note (Addendum)
Will continue Clonazepam 0.5mg  hs prn, may consider gradual dose reduction

## 2016-11-04 ENCOUNTER — Telehealth: Payer: Self-pay

## 2016-11-04 NOTE — Telephone Encounter (Signed)
Patient stated that she takes 1/2 tab of clonazepam along with her seizure medication. She mentioned that you or Manxie was suppose to send it to her pharmacy but when she called them they stated that they haven't received anything. Patient stated that she has enough to last her several days. Please advise

## 2016-11-05 ENCOUNTER — Other Ambulatory Visit: Payer: Self-pay | Admitting: *Deleted

## 2016-11-05 MED ORDER — CLONAZEPAM 1 MG PO TABS: 1.0000 mg | ORAL_TABLET | Freq: Every day | ORAL | 1 refills | Status: DC

## 2016-11-19 ENCOUNTER — Encounter: Payer: Self-pay | Admitting: *Deleted

## 2016-11-19 DIAGNOSIS — H2589 Other age-related cataract: Secondary | ICD-10-CM | POA: Diagnosis not present

## 2016-11-19 DIAGNOSIS — H25013 Cortical age-related cataract, bilateral: Secondary | ICD-10-CM | POA: Diagnosis not present

## 2016-11-19 DIAGNOSIS — H40013 Open angle with borderline findings, low risk, bilateral: Secondary | ICD-10-CM | POA: Diagnosis not present

## 2016-11-19 DIAGNOSIS — H25011 Cortical age-related cataract, right eye: Secondary | ICD-10-CM | POA: Diagnosis not present

## 2016-11-19 DIAGNOSIS — H2511 Age-related nuclear cataract, right eye: Secondary | ICD-10-CM | POA: Diagnosis not present

## 2016-11-19 DIAGNOSIS — H2513 Age-related nuclear cataract, bilateral: Secondary | ICD-10-CM | POA: Diagnosis not present

## 2016-11-26 DIAGNOSIS — Z23 Encounter for immunization: Secondary | ICD-10-CM | POA: Diagnosis not present

## 2016-12-02 ENCOUNTER — Ambulatory Visit: Payer: Medicare Other | Admitting: Family Medicine

## 2016-12-11 ENCOUNTER — Non-Acute Institutional Stay: Payer: Medicare Other | Admitting: Nurse Practitioner

## 2016-12-11 ENCOUNTER — Encounter: Payer: Self-pay | Admitting: Nurse Practitioner

## 2016-12-11 DIAGNOSIS — G40909 Epilepsy, unspecified, not intractable, without status epilepticus: Secondary | ICD-10-CM | POA: Diagnosis not present

## 2016-12-11 DIAGNOSIS — G4700 Insomnia, unspecified: Secondary | ICD-10-CM | POA: Diagnosis not present

## 2016-12-11 DIAGNOSIS — H269 Unspecified cataract: Secondary | ICD-10-CM

## 2016-12-11 NOTE — Assessment & Plan Note (Signed)
Last seizure was in her 520s, taking Carbamazepine 300mg  qhs(hx of Dilantin). Never have Carbamazepine level checked, no s/s of toxicity. Referral to neurology

## 2016-12-11 NOTE — Progress Notes (Signed)
Location:   Clinic [facility]   Place of Service:  Clinic (12)   Provider: Chipper Oman NP  Code Status: DNR  Goals of Care: [place] Advanced Directives 12/11/2016  Does Patient Have a Medical Advance Directive? Yes  Type of Advance Directive Healthcare Power of Attorney  Does patient want to make changes to medical advance directive? No - Patient declined  Copy of Healthcare Power of Attorney in Chart? -     Chief Complaint  Patient presents with  . Acute Visit    trouble sleeping,    HPI: Patient is a 76 y.o. female seen today for an acute visit for trouble sleeping at night, not sleeping much at night for 2 weeks, woke up 1-2 hours at night. Taking Clonazepam 0.5mg  for sleep and in addition to control seizures,  but not adequate, wants to increase to 0.75mg  qhs for sleep. Hx of seizures, last seizure was in her 18s, taking Carbamazepine 300mg  qhs(hx of Dilantin). No recent neurology evaluation  Past Medical History:  Diagnosis Date  . Osteoporosis   . Seizures ([name])    epilepsy    Past Surgical History:  Procedure Laterality Date  . ABDOMINAL HYSTERECTOMY  1995   Dr. Carlis Abbott  . FISSURECTOMY    . WRIST SURGERY Left    due to fracture    Allergies  Allergen Reactions  . Cefaclor Other (See Comments)    unknown  . Codeine Nausea Only  . Other Nausea Only    Unknown allergy Pain medications, Pt states all kinds of pain medications she is unable to take except tylenol  . Oxycodone-Acetaminophen Nausea And Vomiting  . Penicillins Itching    Allergies as of 12/11/2016      Reactions   Cefaclor Other (See Comments)   unknown   Codeine Nausea Only   Other Nausea Only   Unknown allergy Pain medications, Pt states all kinds of pain medications she is unable to take except tylenol   Oxycodone-acetaminophen Nausea And Vomiting   Penicillins Itching      Medication List       Accurate as of 12/11/16 11:59 PM. Always use your most recent med list.            CALCIUM PO Take 600 mg by mouth 2 (two) times daily.   carbamazepine 200 MG tablet Commonly known as:  TEGRETOL Take 300 mg by mouth at bedtime.   NULYTELY PO Take 2.5 oz by mouth.   RECLAST 5 MG/100ML Soln injection Generic drug:  zoledronic acid Inject 5 mg into the vein once.   vitamin C 500 MG tablet Commonly known as:  ASCORBIC ACID Take 500 mg by mouth daily.   Vitamin D (Ergocalciferol) 50000 units Caps capsule Commonly known as:  DRISDOL Take 1 capsule (50,000 Units total) by mouth every 7 (seven) days.   vitamin E 400 UNIT capsule Take 400 Units by mouth daily.       Review of Systems:  Review of Systems  Constitutional: Negative for activity change, appetite change, chills, diaphoresis, fatigue and fever.  Respiratory: Negative for cough, choking, chest tightness, shortness of breath and wheezing.   Cardiovascular: Negative for chest pain, palpitations and leg swelling.  Gastrointestinal: Negative for abdominal pain, constipation, diarrhea, nausea and vomiting.  Genitourinary: Negative for difficulty urinating and dysuria.  Musculoskeletal: Negative for back pain and gait problem.  Skin: Negative for color change, pallor and rash.  Neurological: Positive for seizures. Negative for dizziness, tremors, speech difficulty, weakness, numbness and  headaches.  Psychiatric/Behavioral: Positive for sleep disturbance. Negative for agitation, behavioral problems and confusion. The patient is nervous/anxious.     Health Maintenance  Topic Date Due  . TETANUS/TDAP  07/06/1959  . PNA vac Low Risk Adult (1 of 2 - PCV13) 07/05/2005  . MAMMOGRAM  08/13/2017  . INFLUENZA VACCINE  Completed  . DEXA SCAN  Completed    Physical Exam: Vitals:   12/11/16 1445  BP: 110/60  Pulse: 75  Resp: 18  Temp: 98.2 F (36.8 C)  SpO2: 98%  Weight: 112 lb 6.4 oz (51 kg)  Height: 5\' 5"  (1.651 m)   Body mass index is 18.7 kg/m. Physical Exam  Constitutional: She is oriented to  person, place, and time. She appears well-developed and well-nourished.  HENT:  Head: Normocephalic and atraumatic.  Eyes: Pupils are equal, round, and reactive to light. Conjunctivae and EOM are normal.  Neck: Normal range of motion. Neck supple. No thyromegaly present.  Cardiovascular: Normal rate, regular rhythm and normal heart sounds.   Pulmonary/Chest: She has no wheezes. She has no rales.  Musculoskeletal: Normal range of motion. She exhibits no edema or tenderness.  Neurological: She is alert and oriented to person, place, and time. No cranial nerve deficit. She exhibits normal muscle tone. Coordination normal.  Psychiatric: Her behavior is normal. Judgment and thought content normal.  Appears anxious    Labs reviewed: Basic Metabolic Panel: No results for input(s): NA, K, CL, CO2, GLUCOSE, BUN, CREATININE, CALCIUM, MG, PHOS, TSH in the last 8760 hours. Liver Function Tests: No results for input(s): AST, ALT, ALKPHOS, BILITOT, PROT, ALBUMIN in the last 8760 hours. No results for input(s): LIPASE, AMYLASE in the last 8760 hours. No results for input(s): AMMONIA in the last 8760 hours. CBC: No results for input(s): WBC, NEUTROABS, HGB, HCT, MCV, PLT in the last 8760 hours. Lipid Panel: No results for input(s): CHOL, HDL, LDLCALC, TRIG, CHOLHDL, LDLDIRECT in the last 8760 hours. No results found for: HGBA1C  Procedures since last visit: No results found.  Assessment/Plan Epilepsy without status epilepticus, not intractable Last seizure was in her 1020s, taking Carbamazepine 300mg  qhs(hx of Dilantin). Never have Carbamazepine level checked, no s/s of toxicity. Referral to neurology  Insomnia Neurology referral for seizures and insomnia, increase clonazepam to 0.75mg /0.5mg  qhs for now.   Cataract Surgery 12/23/16 01/17/17    Labs/tests ordered: none  Next appt:  01/27/2017  Time spend 25 minutes.

## 2016-12-11 NOTE — Assessment & Plan Note (Signed)
Surgery 12/23/16 01/17/17

## 2016-12-11 NOTE — Assessment & Plan Note (Signed)
Neurology referral for seizures and insomnia, increase clonazepam to 0.75mg /0.5mg  qhs for now.

## 2016-12-11 NOTE — Patient Instructions (Signed)
Neurology referral for seizures and insomnia, increase clonazepam to 0.75mg/0.5mg qhs for now.  

## 2016-12-12 ENCOUNTER — Other Ambulatory Visit: Payer: Self-pay | Admitting: Internal Medicine

## 2016-12-12 ENCOUNTER — Other Ambulatory Visit: Payer: Self-pay | Admitting: *Deleted

## 2016-12-12 DIAGNOSIS — G40909 Epilepsy, unspecified, not intractable, without status epilepticus: Secondary | ICD-10-CM

## 2016-12-12 MED ORDER — TEGRETOL 200 MG PO TABS: ORAL_TABLET | ORAL | 2 refills | Status: DC

## 2016-12-12 MED ORDER — CLONAZEPAM 0.5 MG PO TABS: 0.5000 mg | ORAL_TABLET | Freq: Two times a day (BID) | ORAL | 1 refills | Status: DC | PRN

## 2016-12-12 MED ORDER — CARBAMAZEPINE 200 MG PO TABS: 300.0000 mg | ORAL_TABLET | Freq: Every day | ORAL | 1 refills | Status: DC

## 2016-12-12 NOTE — Telephone Encounter (Signed)
Called pharmacy spoke with the pharmacist regarding medication. LMOM for patient .

## 2016-12-18 HISTORY — PX: CATARACT EXTRACTION: SUR2

## 2016-12-23 DIAGNOSIS — H25811 Combined forms of age-related cataract, right eye: Secondary | ICD-10-CM | POA: Diagnosis not present

## 2016-12-23 DIAGNOSIS — H2511 Age-related nuclear cataract, right eye: Secondary | ICD-10-CM | POA: Diagnosis not present

## 2016-12-30 ENCOUNTER — Other Ambulatory Visit: Payer: Self-pay | Admitting: *Deleted

## 2016-12-30 DIAGNOSIS — G4700 Insomnia, unspecified: Secondary | ICD-10-CM

## 2016-12-30 DIAGNOSIS — G40909 Epilepsy, unspecified, not intractable, without status epilepticus: Secondary | ICD-10-CM

## 2016-12-30 MED ORDER — CLONAZEPAM 0.5 MG PO TABS: ORAL_TABLET | ORAL | 1 refills | Status: DC

## 2016-12-30 NOTE — Telephone Encounter (Signed)
[facility] Patient says her Clonazepam dose is suppose to be 1 & 1/2 tablet daily.  I reviewed last OV note and it does state this dosage. Called and [facility] and medication directions verified.

## 2017-01-12 DIAGNOSIS — H2512 Age-related nuclear cataract, left eye: Secondary | ICD-10-CM | POA: Diagnosis not present

## 2017-01-12 DIAGNOSIS — H2589 Other age-related cataract: Secondary | ICD-10-CM | POA: Diagnosis not present

## 2017-01-12 DIAGNOSIS — H25012 Cortical age-related cataract, left eye: Secondary | ICD-10-CM | POA: Diagnosis not present

## 2017-01-20 DIAGNOSIS — H25812 Combined forms of age-related cataract, left eye: Secondary | ICD-10-CM | POA: Diagnosis not present

## 2017-01-20 DIAGNOSIS — H2512 Age-related nuclear cataract, left eye: Secondary | ICD-10-CM | POA: Diagnosis not present

## 2017-01-23 ENCOUNTER — Other Ambulatory Visit: Payer: Self-pay

## 2017-01-23 ENCOUNTER — Other Ambulatory Visit: Payer: Self-pay | Admitting: *Deleted

## 2017-01-23 DIAGNOSIS — K5900 Constipation, unspecified: Secondary | ICD-10-CM | POA: Diagnosis not present

## 2017-01-23 DIAGNOSIS — G40509 Epileptic seizures related to external causes, not intractable, without status epilepticus: Secondary | ICD-10-CM | POA: Diagnosis not present

## 2017-01-23 DIAGNOSIS — G40909 Epilepsy, unspecified, not intractable, without status epilepticus: Secondary | ICD-10-CM

## 2017-01-27 LAB — HEPATIC FUNCTION PANEL
AG Ratio: 2 (calc) (ref 1.0–2.5)
ALBUMIN MSPROF: 4.3 g/dL (ref 3.6–5.1)
ALT: 10 U/L (ref 6–29)
AST: 14 U/L (ref 10–35)
Alkaline phosphatase (APISO): 41 U/L (ref 33–130)
BILIRUBIN DIRECT: 0.1 mg/dL (ref 0.0–0.2)
BILIRUBIN TOTAL: 0.5 mg/dL (ref 0.2–1.2)
GLOBULIN: 2.1 g/dL (ref 1.9–3.7)
Indirect Bilirubin: 0.4 mg/dL (calc) (ref 0.2–1.2)
Total Protein: 6.4 g/dL (ref 6.1–8.1)

## 2017-01-27 LAB — COMPREHENSIVE METABOLIC PANEL
AG RATIO: 2 (calc) (ref 1.0–2.5)
ALT: 10 U/L (ref 6–29)
AST: 14 U/L (ref 10–35)
Albumin: 4.3 g/dL (ref 3.6–5.1)
Alkaline phosphatase (APISO): 41 U/L (ref 33–130)
BILIRUBIN TOTAL: 0.5 mg/dL (ref 0.2–1.2)
BUN: 21 mg/dL (ref 7–25)
CALCIUM: 9.8 mg/dL (ref 8.6–10.4)
CO2: 32 mmol/L (ref 20–32)
Chloride: 106 mmol/L (ref 98–110)
Creat: 0.83 mg/dL (ref 0.60–0.93)
GLOBULIN: 2.1 g/dL (ref 1.9–3.7)
Glucose, Bld: 109 mg/dL — ABNORMAL HIGH (ref 65–99)
Potassium: 4.2 mmol/L (ref 3.5–5.3)
SODIUM: 144 mmol/L (ref 135–146)
Total Protein: 6.4 g/dL (ref 6.1–8.1)

## 2017-01-27 LAB — CARBAMAZEPINE LEVEL, TOTAL: Carbamazepine Lvl: 6.6 mg/L (ref 4.0–12.0)

## 2017-01-27 LAB — CBC
HCT: 41.1 % (ref 35.0–45.0)
Hemoglobin: 13.9 g/dL (ref 11.7–15.5)
MCH: 32.3 pg (ref 27.0–33.0)
MCHC: 33.8 g/dL (ref 32.0–36.0)
MCV: 95.4 fL (ref 80.0–100.0)
MPV: 11 fL (ref 7.5–12.5)
PLATELETS: 187 10*3/uL (ref 140–400)
RBC: 4.31 10*6/uL (ref 3.80–5.10)
RDW: 12.5 % (ref 11.0–15.0)
WBC: 4.7 10*3/uL (ref 3.8–10.8)

## 2017-02-03 ENCOUNTER — Non-Acute Institutional Stay: Payer: Medicare Other | Admitting: Internal Medicine

## 2017-02-03 ENCOUNTER — Encounter: Payer: Self-pay | Admitting: Internal Medicine

## 2017-02-03 VITALS — BP 114/64 | HR 64 | Temp 97.7°F | Resp 18 | Ht 65.0 in | Wt 117.4 lb

## 2017-02-03 DIAGNOSIS — R739 Hyperglycemia, unspecified: Secondary | ICD-10-CM | POA: Diagnosis not present

## 2017-02-03 DIAGNOSIS — G40909 Epilepsy, unspecified, not intractable, without status epilepticus: Secondary | ICD-10-CM | POA: Diagnosis not present

## 2017-02-03 DIAGNOSIS — K5900 Constipation, unspecified: Secondary | ICD-10-CM

## 2017-02-03 DIAGNOSIS — M810 Age-related osteoporosis without current pathological fracture: Secondary | ICD-10-CM

## 2017-02-03 DIAGNOSIS — H259 Unspecified age-related cataract: Secondary | ICD-10-CM

## 2017-02-03 DIAGNOSIS — R7303 Prediabetes: Secondary | ICD-10-CM | POA: Insufficient documentation

## 2017-02-03 DIAGNOSIS — G4700 Insomnia, unspecified: Secondary | ICD-10-CM | POA: Diagnosis not present

## 2017-02-03 NOTE — Progress Notes (Signed)
[facility]  Provider: Blanchie Serve MD   Location:  [place] of Service:  Clinic (12)  PCP: Blanchie Serve, MD Patient Care Team: Blanchie Serve, MD as PCP - General (Internal Medicine) Mast, Man X, NP as Nurse Practitioner (Internal Medicine)  Extended Emergency Contact Information Primary Emergency Contact: Marko Stai [place] Mobile Phone: [phone] Relation: Friend   Goals of Care: Advanced Directive information Advanced Directives 12/11/2016  Does Patient Have a Medical Advance Directive? Yes  Type of Advance Directive [place]  Does patient want to make changes to medical advance directive? No - Patient declined  Copy of [place] in Chart? -      Chief Complaint  Patient presents with  . Medical Management of Chronic Issues    3 month follow up  . Medication Refill    No refills needed at this time.   Marland Kitchen Results    Discuss labs    HPI: Patient is a 76 y.o. female seen today for routine visit.   Seizure disorder- currently on clonazepam 0.75 mg daily and tegretol 300 mg daily.  Vitamin d def- currently on vitamin d 50000 units weekly  Osteoporosis- dexa scan 08/14/15 T score -3.  zolendronic acid last received on 10/09/15. Takes calcium and vitamin d supplement.   Chronic constipation- takes nulytely daily   Past Medical History:  Diagnosis Date  . Osteoporosis   . Seizures ([place])    epilepsy   Past Surgical History:  Procedure Laterality Date  . ABDOMINAL HYSTERECTOMY  1995   Dr. Edwyna Shell  . FISSURECTOMY    . WRIST SURGERY Left    due to fracture    reports that she quit smoking about 35 years ago. Her smoking use included cigarettes. She started smoking about 61 years ago. She smoked 1.00 pack per day. She has quit using smokeless tobacco. She reports that she does not drink alcohol or use drugs. Social History   Socioeconomic History    . Marital status: Widowed    Spouse name: Not on file  . Number of children: Not on file  . Years of education: Not on file  . Highest education level: Not on file  Social Needs  . Financial resource strain: Not on file  . Food insecurity - worry: Not on file  . Food insecurity - inability: Not on file  . Transportation needs - medical: Not on file  . Transportation needs - non-medical: Not on file  Occupational History  . Not on file  Tobacco Use  . Smoking status: Former Smoker    Packs/day: 1.00    Types: Cigarettes    Start date: 02/18/1956    Last attempt to quit: 02/17/1981    Years since quitting: 35.9  . Smokeless tobacco: Former Network engineer and Sexual Activity  . Alcohol use: No    Alcohol/week: 0.0 oz  . Drug use: No  . Sexual activity: Not on file  Other Topics Concern  . Not on file  Social History Narrative   Social History     Marital status: Widowed           Spouse name:                        Years of education:                 Number of children:0  Occupational History: Glass blower/designer, Adm. Assistant, [employer].)     None on file      Social History Main Topics     Smoking status: Former Smoker                                             Packs/day: 1.00      Years: 0.00            Types: Cigarettes        Smokeless tobacco: Not on file                        Alcohol use: No               Drug use: No               Sexual activity: Not on file            Does not drink caffeine, but does eat chocolate.   Does live in an apartment ([address]) alone   Does exercise : walks daily      Social History Narrative     None on file       Functional Status Survey:    Family History  Problem Relation Age of Onset  . Alcohol abuse Father   . Diabetes Father   . Diabetes Maternal Grandmother   . Diabetes Paternal Grandmother     Health Maintenance  Topic Date Due  . Samul Dada  07[birth year]1959  . PNA vac Low Risk Adult (1 of 2 -  PCV13) 07/05/2005  . MAMMOGRAM  08/13/2017  . INFLUENZA VACCINE  Completed  . DEXA SCAN  Completed    Allergies  Allergen Reactions  . Cat Hair Extract Other (See Comments)    Patient states that it causes her eyes to swell shut  . Cefaclor Other (See Comments)    unknown  . Codeine Nausea Only  . Other Nausea Only    Unknown allergy Pain medications, Pt states all kinds of pain medications she is unable to take except tylenol  . Oxycodone-Acetaminophen Nausea And Vomiting  . Penicillins Itching    Outpatient Encounter Medications as of 02/03/2017  Medication Sig  . CALCIUM PO Take 600 mg by mouth 2 (two) times daily.  . clonazePAM (KLONOPIN) 0.5 MG tablet Take One and a half tablet by mouth once daily at bedtime for rest  . PEG 3350-KCl-Na Bicarb-NaCl (NULYTELY PO) Take 2.5 oz by mouth.   . TEGRETOL 200 MG tablet Take 1.5 tablets (300 mg total) by mouth at bedtime.  . vitamin C (ASCORBIC ACID) 500 MG tablet Take 500 mg by mouth daily.  . Vitamin D, Ergocalciferol, (DRISDOL) 50000 units CAPS capsule Take 1 capsule (50,000 Units total) by mouth every 7 (seven) days.  . vitamin E 400 UNIT capsule Take 400 Units by mouth daily.  . zoledronic acid (RECLAST) 5 MG/100ML SOLN injection Inject 5 mg into the vein once.   No facility-administered encounter medications on file as of 02/03/2017.     Review of Systems  Constitutional: Negative for appetite change, fatigue and fever.  HENT: Negative for congestion, hearing loss, mouth sores, rhinorrhea, sore throat and trouble swallowing.   Respiratory: Negative for cough, choking, shortness of breath and wheezing.   Cardiovascular: Negative for chest pain, palpitations and leg swelling.  Gastrointestinal: Positive for  constipation. Negative for abdominal pain, blood in stool, nausea and vomiting.       Chronic constipation  Genitourinary: Positive for frequency. Negative for dysuria and hematuria.       Gets up twice at night to urinate    Musculoskeletal: Negative for arthralgias, back pain and gait problem.       No fall reported  Skin: Negative for rash and wound.  Neurological: Negative for dizziness and headaches.  Psychiatric/Behavioral: Positive for dysphoric mood and sleep disturbance. Negative for confusion, hallucinations, self-injury and suicidal ideas.    Vitals:   02/03/17 1055  BP: 114/64  Pulse: 64  Resp: 18  Temp: 97.7 F (36.5 C)  TempSrc: Oral  SpO2: 98%  Weight: 117 lb 6.4 oz (53.3 kg)  Height: 5' 5"  (1.651 m)   Body mass index is 19.54 kg/m.   Wt Readings from Last 3 Encounters:  02/03/17 117 lb 6.4 oz (53.3 kg)  12/11/16 112 lb 6.4 oz (51 kg)  10/30/16 113 lb 12.8 oz (51.6 kg)   Physical Exam  Constitutional: She is oriented to person, place, and time.  Neurological: She is alert and oriented to person, place, and time.  Skin: Skin is warm and dry. No rash noted.  Psychiatric:  Tearful this visit, misses her husband of 18 years with holiday season around    Labs reviewed: Basic Metabolic Panel: Recent Labs    01/23/17 0000  NA 144  K 4.2  CL 106  CO2 32  GLUCOSE 109*  BUN 21  CREATININE 0.83  CALCIUM 9.8   Liver Function Tests: Recent Labs    01/23/17 0000  AST 14  14  ALT 10  10  BILITOT 0.5  0.5  PROT 6.4  6.4   No results for input(s): LIPASE, AMYLASE in the last 8760 hours. No results for input(s): AMMONIA in the last 8760 hours. CBC: Recent Labs    01/23/17 0000  WBC 4.7  HGB 13.9  HCT 41.1  MCV 95.4  PLT 187   Cardiac Enzymes: No results for input(s): CKTOTAL, CKMB, CKMBINDEX, TROPONINI in the last 8760 hours. BNP: Invalid input(s): POCBNP No results found for: HGBA1C Lab Results  Component Value Date   TSH 1.624 08/29/2014   No results found for: VITAMINB12 No results found for: FOLATE No results found for: IRON, TIBC, FERRITIN  Lipid Panel: No results for input(s): CHOL, HDL, LDLCALC, TRIG, CHOLHDL, LDLDIRECT in the last 8760  hours. No results found for: HGBA1C  Procedures since last visit: No results found.  Assessment/Plan  1. Nonintractable epilepsy without status epilepticus, unspecified epilepsy type [facility]) C/w tegretol, upcoming neurology appt 02/27/16. Also on clonazepam - CMP with eGFR; Future  2. Osteoporosis, unspecified osteoporosis type, unspecified pathological fracture presence dexa 2017 reviewed. T score -3. On reclast and due for it in 8/18, not taken it. Schedule appt. Continue ca and vit d  3. Insomnia, unspecified type Continue clonazepam, pending neurology appt with seizure history.   4. Constipation, unspecified constipation type Continue current regimen, encouraged hydration  5. Age-related cataract of both eyes, unspecified age-related cataract type S/p surgery  6. Hyperglycemia - Hemoglobin A1c; Future   Labs/tests ordered:   Orders Placed This Encounter  Procedures  . CMP with eGFR  . Hemoglobin A1c    Next appointment: 4 months with Man Darlina Rumpf  Communication: reviewed care plan with patient    Blanchie Serve, MD Internal Medicine [facility] [address] [place], [place] [address]  Cell Phone (Monday-Friday 8 am - 5 pm): [phone] On Call: [phone] and follow prompts after 5 pm and on weekends Office Phone: [phone] Office Fax: [phone]

## 2017-02-26 ENCOUNTER — Other Ambulatory Visit: Payer: Self-pay | Admitting: Internal Medicine

## 2017-02-26 ENCOUNTER — Other Ambulatory Visit: Payer: Self-pay | Admitting: Nurse Practitioner

## 2017-02-26 ENCOUNTER — Ambulatory Visit (INDEPENDENT_AMBULATORY_CARE_PROVIDER_SITE_OTHER): Payer: Medicare Other | Admitting: Neurology

## 2017-02-26 ENCOUNTER — Encounter: Payer: Self-pay | Admitting: Neurology

## 2017-02-26 VITALS — BP 109/60 | HR 86

## 2017-02-26 DIAGNOSIS — F5101 Primary insomnia: Secondary | ICD-10-CM | POA: Diagnosis not present

## 2017-02-26 DIAGNOSIS — F4321 Adjustment disorder with depressed mood: Secondary | ICD-10-CM

## 2017-02-26 DIAGNOSIS — G4700 Insomnia, unspecified: Secondary | ICD-10-CM

## 2017-02-26 DIAGNOSIS — G40909 Epilepsy, unspecified, not intractable, without status epilepticus: Secondary | ICD-10-CM

## 2017-02-26 NOTE — Progress Notes (Signed)
[facility]    Provider:  Dr Lucia Gaskins Referring Provider: Oneal Grout, MD Primary Care Physician:  Oneal Grout, MD     CC:  Insomnia  HPI:  Erin Ochoa is a 77 y.o. female here as a referral from Dr. Glade Lloyd for seizures and insomnia. She has a PMHx of seizures (well controled, last seizure more than 50 years ago). She does well on Tegretol and has been stable.  Well controlled on tegretol for seizures. She was 20 and had auras of dream,-like visions and she then had a GTCS and was placed on Dilantin. She was then placed on Tegretol and never had a problem since then.  She has had problems sleeping all her life. Her husband died, her mother died. No children. She feels alone. [facility] [place] [facility] where she lives. She has had decades of not being able to sleep. She has insomnia and has taken Klonopin for over a decade. She is concerned about beig on Clonazepam for her insomnia but she can;t sleep, this is a chronic issue. She cannot fall asleep. She has difficulty taking naps. She takes medication at 10pm and then takes Klonopin at 11:15 wakes at 3-4am. She has been on Klonopin for more than 10 years. She is finding she needs more Klonopin to sleep. No other focal neurologic deficits, associated symptoms, inciting events or modifiable factors.  Reviewed notes, labs and imaging from outside physicians, which showed:  Carbamazepine level 6.6 normal   Review of Systems: Patient complains of symptoms per HPI as well as the following symptoms: insomnia. Pertinent negatives and positives per HPI. All others negative.   Social History   Socioeconomic History  . Marital status: Widowed    Spouse name: Not on file  . Number of children: 0  . Years of education: Not on file  . Highest education level: Some college, no degree  Social Needs  . Financial resource strain: Not on file  . Food insecurity - worry: Not on file  . Food insecurity - inability: Not on file    . Transportation needs - medical: Not on file  . Transportation needs - non-medical: Not on file  Occupational History  . Not on file  Tobacco Use  . Smoking status: Former Smoker    Packs/day: 1.00    Types: Cigarettes    Start date: 02/18/1956    Last attempt to quit: 02/17/1981    Years since quitting: 36.0  . Smokeless tobacco: Never Used  Substance and Sexual Activity  . Alcohol use: No    Alcohol/week: 0.0 oz  . Drug use: No  . Sexual activity: Not on file  Other Topics Concern  . Not on file  Social History Narrative   Social History     Marital status: Widowed           Spouse name:                        Years of education:  1 year college              Number of children:0              Occupational History: Print production planner, Adm. Assistant, [employer].)     None on file      Social History Main Topics     Smoking status: Former Smoker  Packs/day: 1.00      Years: 0.00            Types: Cigarettes        Smokeless tobacco: Not on file                        Alcohol use: No               Drug use: No               Sexual activity: Not on file            Does not drink caffeine, but does eat chocolate.   Does live in an apartment ([address]) alone   Does exercise : walks daily   Right handed      Social History Narrative     None on file       Family History  Problem Relation Age of Onset  . Alcohol abuse Father   . Diabetes Father   . Diabetes Maternal Grandmother   . Diabetes Paternal Grandmother     Past Medical History:  Diagnosis Date  . Osteoporosis   . Seizures ([name])    epilepsy    Past Surgical History:  Procedure Laterality Date  . ABDOMINAL HYSTERECTOMY  1995   Dr. Carlis AbbottVan Fletcher  . CATARACT EXTRACTION Bilateral 12/2016  . FISSURECTOMY    . WRIST SURGERY Left    due to fracture    Current Outpatient Medications  Medication Sig Dispense Refill  . CALCIUM PO Take 600 mg by mouth 2 (two) times  daily.    Marland Kitchen. PEG 3350-KCl-Na Bicarb-NaCl (NULYTELY PO) Take 3-3.5 oz by mouth.     . TEGRETOL 200 MG tablet Take 1.5 tablets (300 mg total) by mouth at bedtime. 45 tablet 2  . vitamin C (ASCORBIC ACID) 500 MG tablet Take 500 mg by mouth daily.    . Vitamin D, Ergocalciferol, (DRISDOL) 50000 units CAPS capsule Take 1 capsule (50,000 Units total) by mouth every 7 (seven) days. 30 capsule 2  . vitamin E 400 UNIT capsule Take 400 Units by mouth daily.    . zoledronic acid (RECLAST) 5 MG/100ML SOLN injection Inject 5 mg into the vein once. Every other year per patient    . clonazePAM (KLONOPIN) 0.5 MG tablet TAKE 1&1/2 TABLETS AT BEDTIME AS NEEDED FOR SLEEP. 45 tablet 1   No current facility-administered medications for this visit.     Allergies as of 02/26/2017 - Review Complete 02/03/2017  Allergen Reaction Noted  . Cat hair extract Other (See Comments) 02/03/2017  . Cefaclor Other (See Comments) 01/09/2016  . Codeine Nausea Only 01/09/2016  . Other Nausea Only 07/24/2014  . Oxycodone-acetaminophen Nausea And Vomiting 01/09/2016  . Penicillins Itching 01/09/2016    Vitals: BP 109/60 (BP Location: Right Arm, Patient Position: Sitting)   Pulse 86  Last Weight:  Wt Readings from Last 1 Encounters:  02/03/17 117 lb 6.4 oz (53.3 kg)   Last Height:   Ht Readings from Last 1 Encounters:  02/03/17 5\' 5"  (1.651 m)    Physical exam: Exam: Gen: NAD, conversant, well nourised, well groomed                     CV: RRR, no MRG. No Carotid Bruits. No peripheral edema, warm, nontender Eyes: Conjunctivae clear without exudates or hemorrhage  Neuro: Detailed Neurologic Exam  Speech:    Speech is normal; fluent and spontaneous with normal comprehension.  Cognition:    The patient is oriented to person, place, and time;     recent and remote memory intact;     language fluent;     normal attention, concentration,     fund of knowledge Cranial Nerves:    The pupils are equal, round, and  reactive to light. The fundi are normal and spontaneous venous pulsations are present. Visual fields are full to finger confrontation. Extraocular movements are intact. Trigeminal sensation is intact and the muscles of mastication are normal. The face is symmetric. The palate elevates in the midline. Hearing intact. Voice is normal. Shoulder shrug is normal. The tongue has normal motion without fasciculations.   Coordination:    Normal finger to nose and heel to shin. Normal rapid alternating movements.   Gait:    Heel-toe and tandem gait are normal.   Motor Observation:    No asymmetry, no atrophy, and no involuntary movements noted. Tone:    Normal muscle tone.    Posture:    Posture is normal. normal erect    Strength:    Strength is V/V in the upper and lower limbs.      Sensation: intact to LT     Reflex Exam:  DTR's:    Deep tendon reflexes in the upper and lower extremities are normal bilaterally.   Toes:    The toes are downgoing bilaterally.   Clonus:    Clonus is absent.      Assessment/Plan: This is an extremely lovely 77 year old female with a past medical history of seizures and insomnia.  - Her seizures have been well controlled for over 50 years on Tegretol.  Lat seizure age of 68. Continue.  - She takes Klonopin in the evenings for insomnia.  She has been on Klonopin for over 10 years however given she has been stable for over 50 years on Tegretol without a seizure I do not think that the Klonopin is aiding in any way in his seizure prevention.  I am okay with patient titrating off of the Klonopin as long as it is done very slowly over 6-8 weeks.  In the meantime she needs to be treated for her insomnia, recommend insomnia counseling, Starling Manns at [facility].  Patient could also benefit from therapy, not only for insomnia but continued sadness from the loss of her husband of >50 years.  Cc: Dr. Elta [place], MD  [facility] [address] [place], [place] [address]  Phone [phone] Fax [phone]  A total of 30  minutes was spent face-to-face with this patient. Over half this time was spent on counseling patient on the seizurediagnosis and different diagnostic and therapeutic options available.

## 2017-02-26 NOTE — Patient Instructions (Signed)
Erin Ochoa Dr Erin Ochoa Insomnia counseling: [facility]. [address]    Insomnia Insomnia is a sleep disorder that makes it difficult to fall asleep or to stay asleep. Insomnia can cause tiredness (fatigue), low energy, difficulty concentrating, mood swings, and poor performance at work or school. There are three different ways to classify insomnia:  Difficulty falling asleep.  Difficulty staying asleep.  Waking up too early in the morning.  Any type of insomnia can be long-term (chronic) or short-term (acute). Both are common. Short-term insomnia usually lasts for three months or less. Chronic insomnia occurs at least three times a week for longer than three months. What are the causes? Insomnia may be caused by another condition, situation, or substance, such as:  Anxiety.  Certain medicines.  Gastroesophageal reflux disease (GERD) or other gastrointestinal conditions.  Asthma or other breathing conditions.  Restless legs syndrome, sleep apnea, or other sleep disorders.  Chronic pain.  Menopause. This may include hot flashes.  Stroke.  Abuse of alcohol, tobacco, or illegal drugs.  Depression.  Caffeine.  Neurological disorders, such as Alzheimer disease.  An overactive thyroid (hyperthyroidism).  The cause of insomnia may not be known. What increases the risk? Risk factors for insomnia include:  Gender. Women are more commonly affected than men.  Age. Insomnia is more common as you get older.  Stress. This may involve your professional or personal life.  Income. Insomnia is more common in people with lower income.  Lack of exercise.  Irregular work schedule or night shifts.  Traveling between different time zones.  What are the signs or symptoms? If you have insomnia, trouble falling asleep or trouble staying asleep is the main symptom. This may lead to other symptoms, such as:  Feeling fatigued.  Feeling nervous about  going to sleep.  Not feeling rested in the morning.  Having trouble concentrating.  Feeling irritable, anxious, or depressed.  How is this treated? Treatment for insomnia depends on the cause. If your insomnia is caused by an underlying condition, treatment will focus on addressing the condition. Treatment may also include:  Medicines to help you sleep.  Counseling or therapy.  Lifestyle adjustments.  Follow these instructions at home:  Take medicines only as directed by your health care provider.  Keep regular sleeping and waking hours. Avoid naps.  Keep a sleep diary to help you and your health care provider figure out what could be causing your insomnia. Include: ? When you sleep. ? When you wake up during the night. ? How well you sleep. ? How rested you feel the next day. ? Any side effects of medicines you are taking. ? What you eat and drink.  Make your bedroom a comfortable place where it is easy to fall asleep: ? Put up shades or special blackout curtains to block light from outside. ? Use a white noise machine to block noise. ? Keep the temperature cool.  Exercise regularly as directed by your health care provider. Avoid exercising right before bedtime.  Use relaxation techniques to manage stress. Ask your health care provider to suggest some techniques that may work well for you. These may include: ? Breathing exercises. ? Routines to release muscle tension. ? Visualizing peaceful scenes.  Cut back on alcohol, caffeinated beverages, and cigarettes, especially close to bedtime. These can disrupt your sleep.  Do not overeat or eat spicy foods right before bedtime. This can lead to digestive discomfort that can make it hard for you to sleep.  Limit  screen use before bedtime. This includes: ? Watching TV. ? Using your smartphone, tablet, and computer.  Stick to a routine. This can help you fall asleep faster. Try to do a quiet activity, brush your teeth, and  go to bed at the same time each night.  Get out of bed if you are still awake after 15 minutes of trying to sleep. Keep the lights down, but try reading or doing a quiet activity. When you feel sleepy, go back to bed.  Make sure that you drive carefully. Avoid driving if you feel very sleepy.  Keep all follow-up appointments as directed by your health care provider. This is important. Contact a health care provider if:  You are tired throughout the day or have trouble in your daily routine due to sleepiness.  You continue to have sleep problems or your sleep problems get worse. Get help right away if:  You have serious thoughts about hurting yourself or someone else. This information is not intended to replace advice given to you by your health care provider. Make sure you discuss any questions you have with your health care provider. Document Released: 02/01/2000 Document Revised: 07/06/2015 Document Reviewed: 11/04/2013 Elsevier Interactive Patient Education  [employer]2018 Elsevier Inc.

## 2017-02-27 ENCOUNTER — Other Ambulatory Visit: Payer: Self-pay | Admitting: Nurse Practitioner

## 2017-02-27 DIAGNOSIS — G4700 Insomnia, unspecified: Secondary | ICD-10-CM

## 2017-02-27 DIAGNOSIS — G40909 Epilepsy, unspecified, not intractable, without status epilepticus: Secondary | ICD-10-CM

## 2017-02-27 NOTE — Telephone Encounter (Signed)
Verbal order was given. 

## 2017-03-05 ENCOUNTER — Telehealth: Payer: Self-pay

## 2017-03-05 NOTE — Telephone Encounter (Signed)
Patient approached Dr. Glade LloydPandey after her Antibiotic Stewardship concerned that she didn't get the second dose of the shingles vaccine. Dr. Glade LloydPandey was able to check and the patient is up to date on her shingles vaccine. I left a voicemail letting the patient know that she is up to date with her shingles vaccine

## 2017-03-09 ENCOUNTER — Telehealth: Payer: Self-pay | Admitting: Internal Medicine

## 2017-03-09 NOTE — Telephone Encounter (Signed)
I left a message asking the pt to [facility] on either Tuesday afternoon (1/22) or Thurs/Fri morning. VDM (DD)

## 2017-03-13 ENCOUNTER — Non-Acute Institutional Stay: Payer: Medicare Other

## 2017-03-13 VITALS — BP 118/60 | HR 72 | Temp 98.3°F | Ht 65.0 in | Wt 117.0 lb

## 2017-03-13 DIAGNOSIS — Z0000 Encounter for general adult medical examination without abnormal findings: Secondary | ICD-10-CM

## 2017-03-13 MED ORDER — ZOSTER VAC RECOMB ADJUVANTED 50 MCG/0.5ML IM SUSR: 0.5000 mL | Freq: Once | INTRAMUSCULAR | 1 refills | Status: AC

## 2017-03-13 MED ORDER — PNEUMOCOCCAL 13-VAL CONJ VACC IM SUSP: 0.5000 mL | INTRAMUSCULAR | 0 refills | Status: AC

## 2017-03-13 MED ORDER — TETANUS-DIPHTH-ACELL PERTUSSIS 5-2.5-18.5 LF-MCG/0.5 IM SUSP: 0.5000 mL | Freq: Once | INTRAMUSCULAR | 0 refills | Status: AC

## 2017-03-13 NOTE — Patient Instructions (Signed)
[name] , Thank you for taking time to come for your Medicare Wellness Visit. I appreciate your ongoing commitment to your health goals. Please review the following plan we discussed and let me know if I can assist you in the future.   Screening recommendations/referrals: Colonoscopy up to date, age 77 Mammogram up to date, age 77 Bone Density up to date Recommended yearly ophthalmology/optometry visit for glaucoma screening and checkup Recommended yearly dental visit for hygiene and checkup  Vaccinations: Influenza vaccine up to date, due 2019 fall season Pneumococcal vaccine 13 due, prescription sent to pharmacy, get first Tdap vaccine due, prescription sent to pharmacy- wait a month Shingles vaccine due, prescription sent to pharmacy  Advanced directives: in chart  Conditions/risks identified: none  Next appointment: Mast, NP 06/04/2017 @ 1:30pm   Preventive Care 65 Years and Older, Female Preventive care refers to lifestyle choices and visits with your health care provider that can promote health and wellness. What does preventive care include?  A yearly physical exam. This is also called an annual well check.  Dental exams once or twice a year.  Routine eye exams. Ask your health care provider how often you should have your eyes checked.  Personal lifestyle choices, including:  Daily care of your teeth and gums.  Regular physical activity.  Eating a healthy diet.  Avoiding tobacco and drug use.  Limiting alcohol use.  Practicing safe sex.  Taking low-dose aspirin every day.  Taking vitamin and mineral supplements as recommended by your health care provider. What happens during an annual well check? The services and screenings done by your health care provider during your annual well check will depend on your age, overall health, lifestyle risk factors, and family history of disease. Counseling  Your health care provider may ask you questions about  your:  Alcohol use.  Tobacco use.  Drug use.  Emotional well-being.  Home and relationship well-being.  Sexual activity.  Eating habits.  History of falls.  Memory and ability to understand (cognition).  Work and work Astronomerenvironment.  Reproductive health. Screening  You may have the following tests or measurements:  Height, weight, and BMI.  Blood pressure.  Lipid and cholesterol levels. These may be checked every 5 years, or more frequently if you are over 77 years old.  Skin check.  Lung cancer screening. You may have this screening every year starting at age 77 if you have a 30-pack-year history of smoking and currently smoke or have quit within the past 15 years.  Fecal occult blood test (FOBT) of the stool. You may have this test every year starting at age 77.  Flexible sigmoidoscopy or colonoscopy. You may have a sigmoidoscopy every 5 years or a colonoscopy every 10 years starting at age 77.  Hepatitis C blood test.  Hepatitis B blood test.  Sexually transmitted disease (STD) testing.  Diabetes screening. This is done by checking your blood sugar (glucose) after you have not eaten for a while (fasting). You may have this done every 1-3 years.  Bone density scan. This is done to screen for osteoporosis. You may have this done starting at age 77.  Mammogram. This may be done every 1-2 years. Talk to your health care provider about how often you should have regular mammograms. Talk with your health care provider about your test results, treatment options, and if necessary, the need for more tests. Vaccines  Your health care provider may recommend certain vaccines, such as:  Influenza vaccine. This is recommended  every year.  Tetanus, diphtheria, and acellular pertussis (Tdap, Td) vaccine. You may need a Td booster every 10 years.  Zoster vaccine. You may need this after age 50.  Pneumococcal 13-valent conjugate (PCV13) vaccine. One dose is recommended  after age 17.  Pneumococcal polysaccharide (PPSV23) vaccine. One dose is recommended after age 67. Talk to your health care provider about which screenings and vaccines you need and how often you need them. This information is not intended to replace advice given to you by your health care provider. Make sure you discuss any questions you have with your health care provider. Document Released: 03/02/2015 Document Revised: 10/24/2015 Document Reviewed: 12/05/2014 Elsevier Interactive Patient Education  2017 [place] Prevention in the Home Falls can cause injuries. They can happen to people of all ages. There are many things you can do to make your home safe and to help prevent falls. What can I do on the outside of my home?  Regularly fix the edges of walkways and driveways and fix any cracks.  Remove anything that might make you trip as you walk through a door, such as a raised step or threshold.  Trim any bushes or trees on the path to your home.  Use bright outdoor lighting.  Clear any walking paths of anything that might make someone trip, such as rocks or tools.  Regularly check to see if handrails are loose or broken. Make sure that both sides of any steps have handrails.  Any raised decks and porches should have guardrails on the edges.  Have any leaves, snow, or ice cleared regularly.  Use sand or salt on walking paths during winter.  Clean up any spills in your garage right away. This includes oil or grease spills. What can I do in the bathroom?  Use night lights.  Install grab bars by the toilet and in the tub and shower. Do not use towel bars as grab bars.  Use non-skid mats or decals in the tub or shower.  If you need to sit down in the shower, use a plastic, non-slip stool.  Keep the floor dry. Clean up any water that spills on the floor as soon as it happens.  Remove soap buildup in the tub or shower regularly.  Attach bath mats securely with  double-sided non-slip rug tape.  Do not have throw rugs and other things on the floor that can make you trip. What can I do in the bedroom?  Use night lights.  Make sure that you have a light by your bed that is easy to reach.  Do not use any sheets or blankets that are too big for your bed. They should not hang down onto the floor.  Have a firm chair that has side arms. You can use this for support while you get dressed.  Do not have throw rugs and other things on the floor that can make you trip. What can I do in the kitchen?  Clean up any spills right away.  Avoid walking on wet floors.  Keep items that you use a lot in easy-to-reach places.  If you need to reach something above you, use a strong step stool that has a grab bar.  Keep electrical cords out of the way.  Do not use floor polish or wax that makes floors slippery. If you must use wax, use non-skid floor wax.  Do not have throw rugs and other things on the floor that can make you trip. What  can I do with my stairs?  Do not leave any items on the stairs.  Make sure that there are handrails on both sides of the stairs and use them. Fix handrails that are broken or loose. Make sure that handrails are as long as the stairways.  Check any carpeting to make sure that it is firmly attached to the stairs. Fix any carpet that is loose or worn.  Avoid having throw rugs at the top or bottom of the stairs. If you do have throw rugs, attach them to the floor with carpet tape.  Make sure that you have a light switch at the top of the stairs and the bottom of the stairs. If you do not have them, ask someone to add them for you. What else can I do to help prevent falls?  Wear shoes that:  Do not have high heels.  Have rubber bottoms.  Are comfortable and fit you well.  Are closed at the toe. Do not wear sandals.  If you use a stepladder:  Make sure that it is fully opened. Do not climb a closed stepladder.  Make  sure that both sides of the stepladder are locked into place.  Ask someone to hold it for you, if possible.  Clearly mark and make sure that you can see:  Any grab bars or handrails.  First and last steps.  Where the edge of each step is.  Use tools that help you move around (mobility aids) if they are needed. These include:  Canes.  Walkers.  Scooters.  Crutches.  Turn on the lights when you go into a dark area. Replace any light bulbs as soon as they burn out.  Set up your furniture so you have a clear path. Avoid moving your furniture around.  If any of your floors are uneven, fix them.  If there are any pets around you, be aware of where they are.  Review your medicines with your doctor. Some medicines can make you feel dizzy. This can increase your chance of falling. Ask your doctor what other things that you can do to help prevent falls. This information is not intended to replace advice given to you by your health care provider. Make sure you discuss any questions you have with your health care provider. Document Released: 11/30/2008 Document Revised: 07/12/2015 Document Reviewed: 03/10/2014 Elsevier Interactive Patient Education  2017 [employer].

## 2017-03-13 NOTE — Progress Notes (Signed)
Subjective:   Erin Ochoa is a 77 y.o. female who presents for Medicare Annual (Subsequent) preventive examination at [facility]  Last AWV- December 2017       Objective:     Vitals: BP 118/60 (BP Location: Left Arm, Patient Position: Sitting)   Pulse 72   Temp 98.3 F (36.8 C) (Oral)   Ht 5\' 5"  (1.651 m)   Wt 117 lb (53.1 kg)   SpO2 98%   BMI 19.47 kg/m   Body mass index is 19.47 kg/m.  Advanced Directives 03/13/2017 12/11/2016 10/30/2016  Does Patient Have a Medical Advance Directive? Yes Yes Yes  Type of Estate agent of [place];Living will Healthcare Power of [employer] Power of Attorney  Does patient want to make changes to medical advance directive? No - Patient declined No - Patient declined No - Patient declined  Copy of Healthcare Power of Attorney in Chart? Yes - Yes    Tobacco Social History   Tobacco Use  Smoking Status Former Smoker  . Packs/day: 1.00  . Types: Cigarettes  . Start date: 02/18/1956  . Last attempt to quit: 02/17/1981  . Years since quitting: 36.0  Smokeless Tobacco Never Used     Counseling given: Not Answered   Clinical Intake:  Pre-visit preparation completed: No  Pain : No/denies pain     Nutritional Risks: None Diabetes: No  How often do you need to have someone help you when you read instructions, pamphlets, or other written materials from your doctor or pharmacy?: 1 - Never What is the last grade level you completed in school?: Bachelors  Interpreter Needed?: No  Information entered by :: Tyron Russell, RN  Past Medical History:  Diagnosis Date  . Osteoporosis   . Seizures ([name])    epilepsy   Past Surgical History:  Procedure Laterality Date  . ABDOMINAL HYSTERECTOMY  1995   Dr. Carlis Abbott  . CATARACT EXTRACTION Bilateral 12/2016  . FISSURECTOMY    . WRIST SURGERY Left    due to fracture   Family History  Problem Relation Age of Onset  . Alcohol  abuse Father   . Diabetes Father   . Diabetes Maternal Grandmother   . Diabetes Paternal Grandmother    Social History   Socioeconomic History  . Marital status: Widowed    Spouse name: None  . Number of children: 0  . Years of education: None  . Highest education level: Some college, no degree  Social Needs  . Financial resource strain: Not hard at all  . Food insecurity - worry: Never true  . Food insecurity - inability: Never true  . Transportation needs - medical: No  . Transportation needs - non-medical: No  Occupational History  . None  Tobacco Use  . Smoking status: Former Smoker    Packs/day: 1.00    Types: Cigarettes    Start date: 02/18/1956    Last attempt to quit: 02/17/1981    Years since quitting: 36.0  . Smokeless tobacco: Never Used  Substance and Sexual Activity  . Alcohol use: No    Alcohol/week: 0.0 oz  . Drug use: No  . Sexual activity: None  Other Topics Concern  . None  Social History Narrative   Social History     Marital status: Widowed           Spouse name:  Years of education:  1 year college              Number of children:0              Occupational History: Print production plannerffice Manager, Adm. Assistant, [employer]     None on file      Social History Main Topics     Smoking status: Former Smoker                                             Packs/day: 1.00      Years: 0.00            Types: Cigarettes        Smokeless tobacco: Not on file                        Alcohol use: No               Drug use: No               Sexual activity: Not on file            Does not drink caffeine, but does eat chocolate.   Does live in an apartment ([address]) alone   Does exercise : walks daily   Right handed      Social History Narrative     None on file       Outpatient Encounter Medications as of 03/13/2017  Medication Sig  . clonazePAM (KLONOPIN) 0.5 MG tablet TAKE 1&1/2 TABLETS AT BEDTIME AS NEEDED FOR SLEEP.  . Multiple  Vitamins-Minerals (PRESERVISION AREDS PO) Take 2 tablets by mouth.  . PEG 3350-KCl-Na Bicarb-NaCl (NULYTELY PO) Take 3-3.5 oz by mouth.   . TEGRETOL 200 MG tablet Take 1.5 tablets (300 mg total) by mouth at bedtime.  . Vitamin D, Ergocalciferol, (DRISDOL) 50000 units CAPS capsule Take 1 capsule (50,000 Units total) by mouth every 7 (seven) days.  . zoledronic acid (RECLAST) 5 MG/100ML SOLN injection Inject 5 mg into the vein once. Every other year per patient  . [DISCONTINUED] CALCIUM PO Take 600 mg by mouth 2 (two) times daily.  . [DISCONTINUED] vitamin C (ASCORBIC ACID) 500 MG tablet Take 500 mg by mouth daily.  . [DISCONTINUED] vitamin E 400 UNIT capsule Take 400 Units by mouth daily.   No facility-administered encounter medications on file as of 03/13/2017.     Activities of Daily Living In your present state of health, do you have any difficulty performing the following activities: 03/13/2017  Hearing? N  Vision? N  Difficulty concentrating or making decisions? N  Walking or climbing stairs? N  Dressing or bathing? N  Doing errands, shopping? N  Preparing Food and eating ? N  Using the Toilet? N  In the past six months, have you accidently leaked urine? N  Do you have problems with loss of bowel control? N  Managing your Medications? N  Managing your Finances? N  Housekeeping or managing your Housekeeping? N  Some recent data might be hidden    Patient Care Team: Oneal GroutPandey, Mahima, MD as PCP - General (Internal Medicine) Mast, Man X, NP as Nurse Practitioner (Internal Medicine)    Assessment:   This is a routine wellness examination for Erin Ochoa.  Exercise Activities and Dietary recommendations Current Exercise Habits: Structured exercise class, Type of exercise: Other -  see comments(fhg classes), Time (Minutes): 30, Frequency (Times/Week): 5, Weekly Exercise (Minutes/Week): 150, Exercise limited by: None identified  Goals    None      Fall Risk Fall Risk  02/03/2017  12/11/2016 09/26/2014 09/26/2014  Falls in the past year? No No Yes No  Number falls in past yr: - - 1 -  Injury with Fall? - - No -   Is the patient's home free of loose throw rugs in walkways, pet beds, electrical cords, etc?   yes      Grab bars in the bathroom? yes      Handrails on the stairs?   yes      Adequate lighting?   yes  Timed Get Up and Go performed: 14 seconds, within normal limits  Depression Screen PHQ 2/9 Scores 03/13/2017 12/11/2016 09/26/2014 08/29/2014  PHQ - 2 Score 0 0 0 1     Cognitive Function        Immunization History  Administered Date(s) Administered  . Influenza, High Dose Seasonal PF 11/26/2016  . Influenza-Unspecified 11/18/2014, 11/29/2015  . Pneumococcal Polysaccharide-23 12/30/2012  . Zoster 05/28/2007    Qualifies for Shingles Vaccine?yes, educated and prescription sent to pharmacy  Screening Tests Health Maintenance  Topic Date Due  . HEMOGLOBIN A1C  Mar 17, 1940  . FOOT EXAM  07/06/1950  . OPHTHALMOLOGY EXAM  07/06/1950  . URINE MICROALBUMIN  07/06/1950  . TETANUS/TDAP  07/06/1959  . PNA vac Low Risk Adult (2 of 2 - PCV13) 12/30/2013  . MAMMOGRAM  08/13/2017  . INFLUENZA VACCINE  Completed  . DEXA SCAN  Completed    Cancer Screenings: Lung: Low Dose CT Chest recommended if Age 26-80 years, 30 pack-year currently smoking OR have quit w/in 15years. Patient does not qualify. Breast:  Up to date on Mammogram? Yes   Up to date of Bone Density/Dexa? Yes Colorectal: up to date  Additional Screenings:  Hepatitis B/HIV/Syphillis:declined Hepatitis C Screening: declined     Plan:    I have personally reviewed and addressed the Medicare Annual Wellness questionnaire and have noted the following in the patient's chart:  A. Medical and social history B. Use of alcohol, tobacco or illicit drugs  C. Current medications and supplements D. Functional ability and status E.  Nutritional status F.  Physical activity G. Advance  directives H. List of other physicians I.  Hospitalizations, surgeries, and ER visits in previous 12 months J.  Vitals K. Screenings to include hearing, vision, cognitive, depression L. Referrals and appointments - none  In addition, I have reviewed and discussed with patient certain preventive protocols, quality metrics, and best practice recommendations. A written personalized care plan for preventive services as well as general preventive health recommendations were provided to patient.  See attached scanned questionnaire for additional information.   Signed,   Tyron Russell, RN Nurse Health Advisor   Quick Notes   Health Maintenance: Foot exam, hgA1C, urine microalbumin due. TDAP, PNA 13 and shingrix due and ordered. Last eye exam this month     Abnormal Screen:MMSE 29/30, passed clock darwing     Patient Concerns: none     Nurse Concerns: none

## 2017-03-29 ENCOUNTER — Other Ambulatory Visit: Payer: Self-pay | Admitting: Nurse Practitioner

## 2017-03-29 DIAGNOSIS — G40909 Epilepsy, unspecified, not intractable, without status epilepticus: Secondary | ICD-10-CM

## 2017-03-29 DIAGNOSIS — G4700 Insomnia, unspecified: Secondary | ICD-10-CM

## 2017-03-30 ENCOUNTER — Other Ambulatory Visit: Payer: Self-pay | Admitting: *Deleted

## 2017-03-30 MED ORDER — PEG 3350-KCL-NA BICARB-NACL 420 G PO SOLR: 3.0000 mL | Freq: Every day | ORAL | 2 refills | Status: DC

## 2017-03-31 ENCOUNTER — Other Ambulatory Visit: Payer: Self-pay | Admitting: Nurse Practitioner

## 2017-03-31 DIAGNOSIS — G40909 Epilepsy, unspecified, not intractable, without status epilepticus: Secondary | ICD-10-CM

## 2017-04-08 ENCOUNTER — Other Ambulatory Visit: Payer: Self-pay | Admitting: Nurse Practitioner

## 2017-04-08 DIAGNOSIS — G40909 Epilepsy, unspecified, not intractable, without status epilepticus: Secondary | ICD-10-CM

## 2017-04-14 ENCOUNTER — Telehealth: Payer: Self-pay | Admitting: *Deleted

## 2017-04-14 NOTE — Telephone Encounter (Signed)
Received Prior Authorization Request from [place] for Tegretol 200mg  Take 1&1/2 tablet once daily.  Initiated Prior Authorization through [employer] ZO:[id number] Group:[id number] Bin/PCN: [id number]  Authorization sent to review-48-72 hour determination.  PA Case ID: [id number] Key: [id number]

## 2017-04-15 ENCOUNTER — Other Ambulatory Visit: Payer: Self-pay | Admitting: Nurse Practitioner

## 2017-04-15 DIAGNOSIS — G40909 Epilepsy, unspecified, not intractable, without status epilepticus: Secondary | ICD-10-CM

## 2017-04-15 NOTE — Telephone Encounter (Signed)
Received fax from [employer] #[phone] stating Tegretol is APPROVED from 01/14/17-04/14/18 Member ID: [id number]

## 2017-05-18 ENCOUNTER — Telehealth: Payer: Self-pay | Admitting: *Deleted

## 2017-05-18 NOTE — Telephone Encounter (Signed)
I [facility] that [facility] I would like immunization history be verified with patient on that visit and to provide scripts for due immunization on that visit.

## 2017-05-18 NOTE — Telephone Encounter (Signed)
Patient calling asking which vaccines she needs to be up to date? Pt was seen 02/2017 for wellness and wanted to go to [facility] to [facility] vaccines and confused on what to get. Please advise

## 2017-05-18 NOTE — Telephone Encounter (Signed)
Spoke with the patient and she verbalized understanding that a script would be provided to her during [facility]

## 2017-05-26 DIAGNOSIS — G40909 Epilepsy, unspecified, not intractable, without status epilepticus: Secondary | ICD-10-CM | POA: Diagnosis not present

## 2017-05-26 DIAGNOSIS — R739 Hyperglycemia, unspecified: Secondary | ICD-10-CM | POA: Diagnosis not present

## 2017-05-26 LAB — BASIC METABOLIC PANEL
BUN: 15 (ref 4–21)
Creatinine: 0.8 (ref <=1.1)
Glucose: 105
Potassium: 4.1 (ref 3.4–5.3)
SODIUM: 141 (ref 137–147)

## 2017-05-26 LAB — CBC AND DIFFERENTIAL
HEMATOCRIT: 40 (ref 36–46)
HEMOGLOBIN: 14 (ref 12.0–16.0)
PLATELETS: 189 (ref 150–399)
WBC: 5.1

## 2017-05-26 LAB — HEPATIC FUNCTION PANEL
ALT: 10 (ref 7–35)
AST: 17 (ref 13–35)
Alkaline Phosphatase: 47 (ref 25–125)
BILIRUBIN, TOTAL: 0.6

## 2017-05-26 LAB — HEMOGLOBIN A1C: HEMOGLOBIN A1C: 5.9

## 2017-06-02 ENCOUNTER — Other Ambulatory Visit: Payer: Self-pay | Admitting: *Deleted

## 2017-06-04 ENCOUNTER — Encounter: Payer: Self-pay | Admitting: Nurse Practitioner

## 2017-06-04 ENCOUNTER — Non-Acute Institutional Stay: Payer: Medicare Other | Admitting: Nurse Practitioner

## 2017-06-04 DIAGNOSIS — Z79899 Other long term (current) drug therapy: Secondary | ICD-10-CM | POA: Insufficient documentation

## 2017-06-04 DIAGNOSIS — G40822 Epileptic spasms, not intractable, without status epilepticus: Secondary | ICD-10-CM

## 2017-06-04 DIAGNOSIS — R7303 Prediabetes: Secondary | ICD-10-CM

## 2017-06-04 DIAGNOSIS — F419 Anxiety disorder, unspecified: Secondary | ICD-10-CM | POA: Insufficient documentation

## 2017-06-04 DIAGNOSIS — K5900 Constipation, unspecified: Secondary | ICD-10-CM | POA: Diagnosis not present

## 2017-06-04 DIAGNOSIS — G4700 Insomnia, unspecified: Secondary | ICD-10-CM | POA: Diagnosis not present

## 2017-06-04 NOTE — Progress Notes (Signed)
Location:   Clinic [facility]   Place of Service:  Clinic (12) Provider: Chipper OmanManXie Saraann Enneking NP  Code Status: DNR Goals of Care: [place] 03/13/2017  Does Patient Have a Medical Advance Directive? Yes  Type of Estate agentAdvance Directive Healthcare Power of [place];Living will  Does patient want to make changes to medical advance directive? No - Patient declined  Copy of Healthcare Power of Attorney in Chart? Yes     Chief Complaint  Patient presents with  . Medical Management of Chronic Issues    4 mo f/u    HPI: Patient is a 77 y.o. female seen today for an acute visit for concerns of blood sugar, fasting CBG 109-105 in the past 4 months, she is motivated to continue diet and exercise, Hgb a1c 5.9 05/26/17. She has concerns of low dose ASA 81mg , she has no history of HTN, non smoker, diabetes present, age over [age]year. No recent lipid panel-need update. She is due for Tdap, Prevnar 13, Zostavax. She has question regarding continuation of Mammogram and Colonoscopy, she is 77 year old. She has history of seizures, stable on Tegretol 200mg  qd, Clonazepam 0.6125mg  qhs. No constipation while on Gavilyte daily.   Past Medical History:  Diagnosis Date  . Osteoporosis   . Seizures ([name])    epilepsy    Past Surgical History:  Procedure Laterality Date  . ABDOMINAL HYSTERECTOMY  1995   Dr. Carlis AbbottVan Fletcher  . CATARACT EXTRACTION Bilateral 12/2016  . FISSURECTOMY    . WRIST SURGERY Left    due to fracture    Allergies  Allergen Reactions  . Cat Hair Extract Other (See Comments)    Patient states that it causes her eyes to swell shut  . Cefaclor Other (See Comments)    unknown  . Codeine Nausea Only  . Other Nausea Only    Unknown allergy Pain medications, Pt states all kinds of pain medications she is unable to take except tylenol  . Oxycodone-Acetaminophen Nausea And Vomiting  . Penicillins Itching    Allergies as of 06/04/2017      Reactions   Cat Hair Extract Other (See Comments)     Patient states that it causes her eyes to swell shut   Cefaclor Other (See Comments)   unknown   Codeine Nausea Only   Other Nausea Only   Unknown allergy Pain medications, Pt states all kinds of pain medications she is unable to take except tylenol   Oxycodone-acetaminophen Nausea And Vomiting   Penicillins Itching      Medication List        Accurate as of 06/04/17 11:59 PM. Always use your most recent med list.          clonazePAM 0.5 MG tablet Commonly known as:  KLONOPIN TAKE 1&1/2 TABLETS AT BEDTIME AS NEEDED FOR SLEEP.   NULYTELY PO Take 3-3.5 oz by mouth.   polyethylene glycol-electrolytes 420 g solution Commonly known as:  GAVILYTE-N WITH FLAVOR PACK Take 3-3.5 mLs by mouth daily.   PRESERVISION AREDS PO Take 2 tablets by mouth.   RECLAST 5 MG/100ML Soln injection Generic drug:  zoledronic acid Inject 5 mg into the vein once. Every other year per patient   TEGRETOL 200 MG tablet Generic drug:  carbamazepine TAKE 1 & 1/2 TABLETS ONCE DAILY.   Vitamin D (Ergocalciferol) 50000 units Caps capsule Commonly known as:  DRISDOL Take 1 capsule (50,000 Units total) by mouth every 7 (seven) days.       Review of Systems:  Review of Systems  Constitutional: Negative for activity change, appetite change, chills, diaphoresis, fatigue and fever.  HENT: Negative for congestion, hearing loss, trouble swallowing and voice change.   Respiratory: Negative for cough, shortness of breath and wheezing.   Cardiovascular: Negative for chest pain, palpitations and leg swelling.  Gastrointestinal: Positive for constipation. Negative for abdominal distention, abdominal pain, diarrhea, nausea and vomiting.  Genitourinary: Positive for frequency. Negative for difficulty urinating, dysuria and urgency.  Musculoskeletal: Negative for back pain and gait problem.  Skin: Negative for color change and pallor.  Neurological: Negative for seizures, weakness and headaches.   Psychiatric/Behavioral: Positive for sleep disturbance. Negative for agitation, confusion and hallucinations. The patient is nervous/anxious.     Health Maintenance  Topic Date Due  . FOOT EXAM  07/06/1950  . OPHTHALMOLOGY EXAM  07/06/1950  . URINE MICROALBUMIN  07[birth year]06/1950  . TETANUS/TDAP  07/06/1959  . PNA vac Low Risk Adult (2 of 2 - PCV13) 12/30/2013  . MAMMOGRAM  08/13/2017  . INFLUENZA VACCINE  09/17/2017  . HEMOGLOBIN A1C  11/25/2017  . DEXA SCAN  Completed    Physical Exam: Vitals:   06/04/17 1323  BP: (!) 110/58  Pulse: 88  Resp: 20  Temp: 98.3 F (36.8 C)  SpO2: 97%  Weight: 115 lb 9.6 oz (52.4 kg)  Height: 5\' 5"  (1.651 m)   Body mass index is 19.24 kg/m. Physical Exam  Constitutional: She is oriented to person, place, and time. She appears well-developed and well-nourished.  HENT:  Head: Normocephalic and atraumatic.  Eyes: Pupils are equal, round, and reactive to light. EOM are normal.  Neck: Normal range of motion. Neck supple. No JVD present. No thyromegaly present.  Cardiovascular: Normal rate and regular rhythm.  No murmur heard. Pulmonary/Chest: Effort normal and breath sounds normal. She has no wheezes. She has no rales.  Abdominal: Soft. Bowel sounds are normal. She exhibits no distension and no mass. There is no tenderness. There is no rebound and no guarding.  Musculoskeletal: She exhibits no edema.  Neurological: She is alert and oriented to person, place, and time. No cranial nerve deficit. She exhibits normal muscle tone. Coordination normal.  Skin: Skin is warm and dry. No erythema. No pallor.  Psychiatric: She has a normal mood and affect. Her behavior is normal.    Labs reviewed: Basic Metabolic Panel: Recent Labs    01/23/17 0000 05/26/17  NA 144 141  K 4.2 4.1  CL 106  --   CO2 32  --   GLUCOSE 109*  --   BUN 21 15  CREATININE 0.83 0.8  CALCIUM 9.8  --    Liver Function Tests: Recent Labs    01/23/17 0000 05/26/17  AST 14   14 17   ALT 10  10 10   ALKPHOS  --  47  BILITOT 0.5  0.5  --   PROT 6.4  6.4  --    No results for input(s): LIPASE, AMYLASE in the last 8760 hours. No results for input(s): AMMONIA in the last 8760 hours. CBC: Recent Labs    01/23/17 0000 05/26/17  WBC 4.7 5.1  HGB 13.9 14.0  HCT 41.1 40  MCV 95.4  --   PLT 187 189   Lipid Panel: No results for input(s): CHOL, HDL, LDLCALC, TRIG, CHOLHDL, LDLDIRECT in the last 8760 hours. Lab Results  Component Value Date   HGBA1C 5.9 05/26/2017    Procedures since last visit: No results found.  Assessment/Plan Pre-diabetes concerns of blood sugar, fasting  CBG 109-105 in the past 4 months, she is motivated to continue diet and exercise, Hgb a1c 5.9 05/26/17. She has concerns of low dose ASA 81mg  if indicated,  she has no history of HTN, non smoker, diabetes present, age over 47year. No recent lipid panel-need update. Update lipid panel. Not starting ASA presently.   Epilepsy without status epilepticus, not intractable ([name]) She has history of seizures, stable, continue Tegretol 200mg  qd, Clonazepam 0.6125mg  qhs.    Constipation No constipation, continue Gavilyte daily.   Insomnia Continue Clonazepam 0.6125mg  nightly to help sleep and seizures  Anxiety Continue Clonazepam.   High risk medications (not anticoagulants) long-term use Lipid panel.     Labs/tests ordered: lipid panel prior to the next appointment.   Plan of care reviewed with the patient, Prevnar 13, Tdap, Zostavax prescriptions provided.   Next appt: 6 months  Time spend 25 minutes.

## 2017-06-04 NOTE — Assessment & Plan Note (Signed)
Lipid panel 

## 2017-06-04 NOTE — Assessment & Plan Note (Signed)
Continue Clonazepam.

## 2017-06-04 NOTE — Patient Instructions (Signed)
F/u in clinic [facility] in 6 months, obtain lipid panel prior to the appointment.

## 2017-06-04 NOTE — Assessment & Plan Note (Addendum)
She has history of seizures, stable, continue Tegretol 200mg  qd, Clonazepam 0.6125mg  qhs.

## 2017-06-04 NOTE — Assessment & Plan Note (Addendum)
concerns of blood sugar, fasting CBG 109-105 in the past 4 months, she is motivated to continue diet and exercise, Hgb a1c 5.9 05/26/17. She has concerns of low dose ASA 81mg  if indicated,  she has no history of HTN, non smoker, diabetes present, age over [age]year. No recent lipid panel-need update. Update lipid panel. Not starting ASA presently.

## 2017-06-04 NOTE — Assessment & Plan Note (Signed)
Continue Clonazepam 0.6125mg  nightly to help sleep and seizures

## 2017-06-04 NOTE — Assessment & Plan Note (Signed)
No constipation, continue Gavilyte daily.

## 2017-06-14 ENCOUNTER — Other Ambulatory Visit: Payer: Self-pay | Admitting: Nurse Practitioner

## 2017-06-14 DIAGNOSIS — G40909 Epilepsy, unspecified, not intractable, without status epilepticus: Secondary | ICD-10-CM

## 2017-06-25 DIAGNOSIS — H40013 Open angle with borderline findings, low risk, bilateral: Secondary | ICD-10-CM | POA: Diagnosis not present

## 2017-07-08 ENCOUNTER — Other Ambulatory Visit: Payer: Self-pay | Admitting: Nurse Practitioner

## 2017-07-08 DIAGNOSIS — G40909 Epilepsy, unspecified, not intractable, without status epilepticus: Secondary | ICD-10-CM

## 2017-07-08 DIAGNOSIS — G4700 Insomnia, unspecified: Secondary | ICD-10-CM

## 2017-08-06 ENCOUNTER — Other Ambulatory Visit: Payer: Self-pay | Admitting: Nurse Practitioner

## 2017-08-06 DIAGNOSIS — G40909 Epilepsy, unspecified, not intractable, without status epilepticus: Secondary | ICD-10-CM

## 2017-08-06 DIAGNOSIS — G4700 Insomnia, unspecified: Secondary | ICD-10-CM

## 2017-08-07 ENCOUNTER — Other Ambulatory Visit: Payer: Self-pay | Admitting: *Deleted

## 2017-08-07 DIAGNOSIS — G40909 Epilepsy, unspecified, not intractable, without status epilepticus: Secondary | ICD-10-CM

## 2017-08-07 DIAGNOSIS — G4700 Insomnia, unspecified: Secondary | ICD-10-CM

## 2017-08-07 MED ORDER — CLONAZEPAM 0.5 MG PO TABS: ORAL_TABLET | ORAL | 0 refills | Status: DC

## 2017-08-07 NOTE — Telephone Encounter (Signed)
Patient requested refill. [place] [facility]: [place] after reviewing last OV note.

## 2017-09-24 DIAGNOSIS — Z23 Encounter for immunization: Secondary | ICD-10-CM | POA: Diagnosis not present

## 2017-09-30 ENCOUNTER — Encounter: Payer: Self-pay | Admitting: Internal Medicine

## 2017-10-01 DIAGNOSIS — H353132 Nonexudative age-related macular degeneration, bilateral, intermediate dry stage: Secondary | ICD-10-CM | POA: Diagnosis not present

## 2017-10-01 DIAGNOSIS — Z961 Presence of intraocular lens: Secondary | ICD-10-CM | POA: Diagnosis not present

## 2017-10-01 DIAGNOSIS — H40013 Open angle with borderline findings, low risk, bilateral: Secondary | ICD-10-CM | POA: Diagnosis not present

## 2017-10-01 DIAGNOSIS — H35363 Drusen (degenerative) of macula, bilateral: Secondary | ICD-10-CM | POA: Diagnosis not present

## 2017-10-29 ENCOUNTER — Encounter: Payer: Self-pay | Admitting: Nurse Practitioner

## 2017-10-29 ENCOUNTER — Non-Acute Institutional Stay: Payer: Medicare Other | Admitting: Nurse Practitioner

## 2017-10-29 DIAGNOSIS — R1013 Epigastric pain: Secondary | ICD-10-CM

## 2017-10-29 DIAGNOSIS — G8929 Other chronic pain: Secondary | ICD-10-CM | POA: Insufficient documentation

## 2017-10-29 DIAGNOSIS — G40822 Epileptic spasms, not intractable, without status epilepticus: Secondary | ICD-10-CM | POA: Diagnosis not present

## 2017-10-29 NOTE — Patient Instructions (Addendum)
Obtain CBC/diff CMP H-Pylori antibody, amylase, lipase next Tuesday. Protonix 40mg  daily for now. F/u 2weeks

## 2017-10-29 NOTE — Progress Notes (Signed)
Location:   clinic [facility]   Place of Service:  Clinic (12) Provider: Chipper Oman NP  Code Status: DNR Goals of [place] 03/13/2017  Does Patient Have a Medical Advance Directive? Yes  Type of Estate agent of [place];Living will  Does patient want to make changes to medical advance directive? No - Patient declined  Copy of Healthcare Power of Attorney in Chart? Yes     Chief Complaint  Patient presents with  . Acute Visit    UGI pain x 4 mo. after she eats, sometimes moves to her back    HPI: Patient is a 77 y.o. female seen today for an acute visit for upper abd pain about 3-4 months, gradual onset, aches/indegestion after eat, sometime she feels pain in her middle back,  better when she goes to bed, nauseated and burping at times, but no vomiting, constipation, or diarrhea. Hx of seizure: stable on Tegretol 300mg  qd, Clonazepam 0.75mg  hs prn.   Past Medical History:  Diagnosis Date  . Osteoporosis   . Seizures ([name])    epilepsy    Past Surgical History:  Procedure Laterality Date  . ABDOMINAL HYSTERECTOMY  1995   Dr. Carlis Abbott  . CATARACT EXTRACTION Bilateral 12/2016  . FISSURECTOMY    . WRIST SURGERY Left    due to fracture    Allergies  Allergen Reactions  . Cat Hair Extract Other (See Comments)    Patient states that it causes her eyes to swell shut  . Cefaclor Other (See Comments)    unknown  . Codeine Nausea Only  . Other Nausea Only    Unknown allergy Pain medications, Pt states all kinds of pain medications she is unable to take except tylenol  . Oxycodone-Acetaminophen Nausea And Vomiting  . Penicillins Itching    Allergies as of 10/29/2017      Reactions   Cat Hair Extract Other (See Comments)   Patient states that it causes her eyes to swell shut   Cefaclor Other (See Comments)   unknown   Codeine Nausea Only   Other Nausea Only   Unknown allergy Pain medications, Pt states all kinds of pain  medications she is unable to take except tylenol   Oxycodone-acetaminophen Nausea And Vomiting   Penicillins Itching      Medication List        Accurate as of 10/29/17 11:59 PM. Always use your most recent med list.          clonazePAM 0.5 MG tablet Commonly known as:  KLONOPIN TAKE 1&1/2 TABLETS AT BEDTIME AS NEEDED FOR SLEEP.   NULYTELY PO Take 3-3.5 oz by mouth.   PRESERVISION AREDS PO Take 2 tablets by mouth.   RECLAST 5 MG/100ML Soln injection Generic drug:  zoledronic acid Inject 5 mg into the vein once. Every other year per patient   TEGRETOL 200 MG tablet Generic drug:  carbamazepine TAKE 1 & 1/2 TABLETS ONCE DAILY.   Vitamin D (Ergocalciferol) 50000 units Caps capsule Commonly known as:  DRISDOL Take 1 capsule (50,000 Units total) by mouth every 7 (seven) days.       Review of Systems:  Review of Systems  Constitutional: Negative for activity change, appetite change, chills, diaphoresis, fatigue, fever and unexpected weight change.  HENT: Positive for hearing loss. Negative for congestion, trouble swallowing and voice change.   Respiratory: Negative for cough and shortness of breath.   Cardiovascular: Negative for chest pain, palpitations and leg swelling.  Gastrointestinal:  Positive for abdominal pain and nausea. Negative for abdominal distention, blood in stool, constipation, diarrhea and vomiting.  Genitourinary: Positive for frequency. Negative for difficulty urinating, dysuria and urgency.  Musculoskeletal: Positive for arthralgias and gait problem.  Skin: Negative for color change and pallor.  Neurological: Negative for dizziness, seizures, speech difficulty, weakness and headaches.  Psychiatric/Behavioral: Negative for agitation, behavioral problems, hallucinations and sleep disturbance. The patient is nervous/anxious.     Health Maintenance  Topic Date Due  . FOOT EXAM  07/06/1950  . OPHTHALMOLOGY EXAM  07/06/1950  . URINE MICROALBUMIN   07/06/1950  . PNA vac Low Risk Adult (2 of 2 - PCV13) 12/30/2013  . MAMMOGRAM  08/13/2017  . INFLUENZA VACCINE  09/17/2017  . HEMOGLOBIN A1C  11/25/2017  . TETANUS/TDAP  08/21/2027  . DEXA SCAN  Completed    Physical Exam: Vitals:   10/29/17 1536  BP: 110/62  Pulse: 83  Resp: 18  Temp: 98 F (36.7 C)  TempSrc: Oral  SpO2: 98%  Weight: 111 lb 12.8 oz (50.7 kg)  Height: 5\' 5"  (1.651 m)   Body mass index is 18.6 kg/m. Physical Exam  Constitutional: She is oriented to person, place, and time. She appears well-developed and well-nourished.  HENT:  Head: Normocephalic and atraumatic.  Eyes: Pupils are equal, round, and reactive to light. EOM are normal.  Neck: Normal range of motion. Neck supple. No JVD present. No thyromegaly present.  Cardiovascular: Normal rate and regular rhythm.  No murmur heard. Pulmonary/Chest: Effort normal. She has no wheezes. She has no rales.  Abdominal: Soft. Bowel sounds are normal. She exhibits no distension. There is no tenderness.  Musculoskeletal: Normal range of motion. She exhibits no edema or tenderness.  Neurological: She is alert and oriented to person, place, and time. No cranial nerve deficit. She exhibits normal muscle tone. Coordination normal.  Skin: Skin is warm and dry.  Psychiatric: She has a normal mood and affect. Her behavior is normal. Judgment and thought content normal.    Labs reviewed: Basic Metabolic Panel: Recent Labs    01/23/17 0000 05/26/17  NA 144 141  K 4.2 4.1  CL 106  --   CO2 32  --   GLUCOSE 109*  --   BUN 21 15  CREATININE 0.83 0.8  CALCIUM 9.8  --    Liver Function Tests: Recent Labs    01/23/17 0000 05/26/17  AST 14  14 17   ALT 10  10 10   ALKPHOS  --  47  BILITOT 0.5  0.5  --   PROT 6.4  6.4  --    No results for input(s): LIPASE, AMYLASE in the last 8760 hours. No results for input(s): AMMONIA in the last 8760 hours. CBC: Recent Labs    01/23/17 0000 05/26/17  WBC 4.7 5.1  HGB  13.9 14.0  HCT 41.1 40  MCV 95.4  --   PLT 187 189   Lipid Panel: No results for input(s): CHOL, HDL, LDLCALC, TRIG, CHOLHDL, LDLDIRECT in the last 8760 hours. Lab Results  Component Value Date   HGBA1C 5.9 05/26/2017    Procedures since last visit: No results found.  Assessment/Plan Abdominal pain About 3-4 months, gradual onset, aches/indegestion after eat, better when she goes to bed, nauseated and burping at times, but no vomiting, constipation, or diarrhea. CBC/diff CMP H-Pylori antibody, amylase, lipase next Tuesday. Protonix 40mg  daily for now.   Epilepsy without status epilepticus, not intractable ([name]) Hx of seizure: free of seizure activities, continue Tegretol  300mg  qd, Clonazepam 0.75mg  hs prn.      Labs/tests ordered:  Obtain CBC/diff CMP H-Pylori antibody, amylase, lipase next Tuesday.   Next appt:  2 weeks.

## 2017-10-29 NOTE — Assessment & Plan Note (Signed)
Hx of seizure: free of seizure activities, continue Tegretol 300mg  qd, Clonazepam 0.75mg  hs prn.

## 2017-10-29 NOTE — Assessment & Plan Note (Signed)
About 3-4 months, gradual onset, aches/indegestion after eat, better when she goes to bed, nauseated and burping at times, but no vomiting, constipation, or diarrhea. CBC/diff CMP H-Pylori antibody, amylase, lipase next Tuesday. Protonix 40mg  daily for now.

## 2017-10-30 ENCOUNTER — Other Ambulatory Visit: Payer: Self-pay

## 2017-10-30 MED ORDER — PANTOPRAZOLE SODIUM 40 MG PO TBEC: 40.0000 mg | DELAYED_RELEASE_TABLET | Freq: Every day | ORAL | 0 refills | Status: DC

## 2017-10-30 NOTE — Telephone Encounter (Signed)
Patient called to say that the prescription for protonix was never sent to the pharmacy [facility] After looking at OV note, rx was sent to the pharmacy.

## 2017-11-04 ENCOUNTER — Other Ambulatory Visit: Payer: Self-pay | Admitting: *Deleted

## 2017-11-04 DIAGNOSIS — Z79899 Other long term (current) drug therapy: Secondary | ICD-10-CM | POA: Diagnosis not present

## 2017-11-04 DIAGNOSIS — R1013 Epigastric pain: Secondary | ICD-10-CM | POA: Diagnosis not present

## 2017-11-05 ENCOUNTER — Other Ambulatory Visit: Payer: Medicare Other

## 2017-11-05 DIAGNOSIS — Z79899 Other long term (current) drug therapy: Secondary | ICD-10-CM

## 2017-11-05 DIAGNOSIS — R1013 Epigastric pain: Secondary | ICD-10-CM

## 2017-11-05 LAB — LIPID PANEL
Cholesterol: 219 mg/dL — ABNORMAL HIGH (ref <200)
HDL: 49 mg/dL — ABNORMAL LOW (ref >50)
LDL Cholesterol (Calc): 143 mg/dL (calc) — ABNORMAL HIGH
Non-HDL Cholesterol (Calc): 170 mg/dL (calc) — ABNORMAL HIGH (ref <130)
Total CHOL/HDL Ratio: 4.5 (calc) (ref <5.0)
Triglycerides: 142 mg/dL (ref <150)

## 2017-11-05 LAB — CBC WITH DIFFERENTIAL/PLATELET
Basophils Absolute: 21 cells/uL (ref 0–200)
Basophils Relative: 0.5 %
Eosinophils Absolute: 49 cells/uL (ref 15–500)
Eosinophils Relative: 1.2 %
HCT: 38.9 % (ref 35.0–45.0)
Hemoglobin: 13.5 g/dL (ref 11.7–15.5)
Lymphs Abs: 1177 cells/uL (ref 850–3900)
MCH: 31.8 pg (ref 27.0–33.0)
MCHC: 34.7 g/dL (ref 32.0–36.0)
MCV: 91.7 fL (ref 80.0–100.0)
MPV: 10.7 fL (ref 7.5–12.5)
Monocytes Relative: 13.7 %
Neutro Abs: 2292 cells/uL (ref 1500–7800)
Neutrophils Relative %: 55.9 %
PLATELETS: 177 10*3/uL (ref 140–400)
RBC: 4.24 10*6/uL (ref 3.80–5.10)
RDW: 13.5 % (ref 11.0–15.0)
TOTAL LYMPHOCYTE: 28.7 %
WBC: 4.1 10*3/uL (ref 3.8–10.8)
WBCMIX: 562 {cells}/uL (ref 200–950)

## 2017-11-05 LAB — LIPASE: Lipase: 15 U/L (ref 7–60)

## 2017-11-05 LAB — COMPLETE METABOLIC PANEL WITH GFR
AG Ratio: 2.2 (calc) (ref 1.0–2.5)
ALBUMIN MSPROF: 4.1 g/dL (ref 3.6–5.1)
ALKALINE PHOSPHATASE (APISO): 48 U/L (ref 33–130)
ALT: 9 U/L (ref 6–29)
AST: 16 U/L (ref 10–35)
BUN: 11 mg/dL (ref 7–25)
CO2: 28 mmol/L (ref 20–32)
CREATININE: 0.73 mg/dL (ref 0.60–0.93)
Calcium: 9.3 mg/dL (ref 8.6–10.4)
Chloride: 106 mmol/L (ref 98–110)
GFR, Est African American: 92 mL/min/{1.73_m2} (ref >=60)
GFR, Est Non African American: 79 mL/min/{1.73_m2} (ref >=60)
GLOBULIN: 1.9 g/dL (ref 1.9–3.7)
Glucose, Bld: 103 mg/dL — ABNORMAL HIGH (ref 65–99)
Potassium: 3.8 mmol/L (ref 3.5–5.3)
SODIUM: 141 mmol/L (ref 135–146)
Total Bilirubin: 0.5 mg/dL (ref 0.2–1.2)
Total Protein: 6 g/dL — ABNORMAL LOW (ref 6.1–8.1)

## 2017-11-05 LAB — AMYLASE: Amylase: 19 U/L — ABNORMAL LOW (ref 21–101)

## 2017-11-06 LAB — H. PYLORI BREATH TEST: H. PYLORI BREATH TEST: NOT DETECTED

## 2017-11-19 ENCOUNTER — Non-Acute Institutional Stay: Payer: Medicare Other | Admitting: Nurse Practitioner

## 2017-11-19 ENCOUNTER — Encounter: Payer: Self-pay | Admitting: Nurse Practitioner

## 2017-11-19 DIAGNOSIS — K5900 Constipation, unspecified: Secondary | ICD-10-CM | POA: Diagnosis not present

## 2017-11-19 DIAGNOSIS — R1013 Epigastric pain: Secondary | ICD-10-CM

## 2017-11-19 NOTE — Patient Instructions (Addendum)
Try OTC acid reducer. Will refer to GI specialist, Dr. Lauro Franklin [address] [place], [place] [phone]. F/u in clinic [facility] 4 months

## 2017-11-19 NOTE — Progress Notes (Signed)
Location:   clinic [facility]   Place of Service:  Clinic (12) Provider: Chipper Oman NP  Code Status: DNR Goals of Care: [place] 03/13/2017  Does Patient Have a Medical Advance Directive? Yes  Type of Estate agent of [place];Living will  Does patient want to make changes to medical advance directive? No - Patient declined  Copy of Healthcare Power of Attorney in Chart? Yes     Chief Complaint  Patient presents with  . Medical Management of Chronic Issues    2 week f/u-epigastric pain    HPI: Patient is a 77 y.o. female seen today for evaluation of persisted abd pain after eating, radiating to the mid back, less nausea since started light dinner. 11/05/17 Lipase 15, Amylase 19, wbc 4.1, Hgb 13.5, plt 177, neutrophils 56%, Na 141, K 3.8, Ca 0/3, Bun 11, creat 0.73, TP 6.0, albumin 4.1, LFT wnl, 11/04/17 H pylori breath test-not detected.  Stopped Protonix due to AR of headache and she doesn't think there is any efficacy. Her weights has been stable. She has history of constipation, stable on NULYTELY daily.   Past Medical History:  Diagnosis Date  . Osteoporosis   . Seizures ([name])    epilepsy    Past Surgical History:  Procedure Laterality Date  . ABDOMINAL HYSTERECTOMY  1995   Dr. Carlis Abbott  . CATARACT EXTRACTION Bilateral 12/2016  . FISSURECTOMY    . WRIST SURGERY Left    due to fracture    Allergies  Allergen Reactions  . Cat Hair Extract Other (See Comments)    Patient states that it causes her eyes to swell shut  . Cefaclor Other (See Comments)    unknown  . Codeine Nausea Only  . Other Nausea Only    Unknown allergy Pain medications, Pt states all kinds of pain medications she is unable to take except tylenol  . Oxycodone-Acetaminophen Nausea And Vomiting  . Penicillins Itching    Allergies as of 11/19/2017      Reactions   Cat Hair Extract Other (See Comments)   Patient states that it causes her eyes to swell shut   Cefaclor Other (See Comments)   unknown   Codeine Nausea Only   Other Nausea Only   Unknown allergy Pain medications, Pt states all kinds of pain medications she is unable to take except tylenol   Oxycodone-acetaminophen Nausea And Vomiting   Penicillins Itching      Medication List        Accurate as of 11/19/17  3:44 PM. Always use your most recent med list.          clonazePAM 0.5 MG tablet Commonly known as:  KLONOPIN TAKE 1&1/2 TABLETS AT BEDTIME AS NEEDED FOR SLEEP.   NULYTELY PO Take 3-3.5 oz by mouth.   pantoprazole 40 MG tablet Commonly known as:  PROTONIX Take 1 tablet (40 mg total) by mouth daily.   PRESERVISION AREDS PO Take 2 tablets by mouth.   RECLAST 5 MG/100ML Soln injection Generic drug:  zoledronic acid Inject 5 mg into the vein once. Every other year per patient   TEGRETOL 200 MG tablet Generic drug:  carbamazepine TAKE 1 & 1/2 TABLETS ONCE DAILY.   Vitamin D (Ergocalciferol) 50000 units Caps capsule Commonly known as:  DRISDOL Take 1 capsule (50,000 Units total) by mouth every 7 (seven) days.       Review of Systems:  Review of Systems  Constitutional: Negative for activity change, appetite change, chills,  diaphoresis, fatigue, fever and unexpected weight change.  HENT: Negative for congestion, hearing loss and voice change.   Respiratory: Negative for cough, shortness of breath and wheezing.   Cardiovascular: Negative for chest pain, palpitations and leg swelling.  Gastrointestinal: Positive for abdominal pain. Negative for abdominal distention, blood in stool, constipation, diarrhea, nausea and vomiting.  Genitourinary: Negative for difficulty urinating, dysuria and urgency.  Musculoskeletal: Positive for arthralgias and gait problem.  Skin: Negative for color change and pallor.  Neurological: Negative for dizziness, seizures, speech difficulty, weakness and headaches.  Psychiatric/Behavioral: Negative for agitation, behavioral problems  and sleep disturbance. The patient is nervous/anxious.     Health Maintenance  Topic Date Due  . FOOT EXAM  07/06/1950  . OPHTHALMOLOGY EXAM  07/06/1950  . URINE MICROALBUMIN  07/06/1950  . PNA vac Low Risk Adult (2 of 2 - PCV13) 12/30/2013  . MAMMOGRAM  08/13/2017  . INFLUENZA VACCINE  09/17/2017  . HEMOGLOBIN A1C  11/25/2017  . TETANUS/TDAP  08/21/2027  . DEXA SCAN  Completed    Physical Exam: Vitals:   11/19/17 1417  BP: (!) 110/54  Pulse: 93  Resp: 20  Temp: 98.4 F (36.9 C)  SpO2: 98%  Weight: 115 lb 6.4 oz (52.3 kg)  Height: 5\' 5"  (1.651 m)   Body mass index is 19.2 kg/m. Physical Exam  Constitutional: She is oriented to person, place, and time. She appears well-developed and well-nourished.  HENT:  Head: Normocephalic and atraumatic.  Eyes: Pupils are equal, round, and reactive to light. EOM are normal.  Neck: Normal range of motion. Neck supple. No JVD present. No thyromegaly present.  Cardiovascular: Normal rate and regular rhythm.  No murmur heard. Pulmonary/Chest: Effort normal. She has no wheezes. She has no rales.  Abdominal: Soft. She exhibits no distension. There is no tenderness. There is no rebound and no guarding. No hernia.  Musculoskeletal: Normal range of motion. She exhibits no edema.  Neurological: She is alert and oriented to person, place, and time. No cranial nerve deficit. She exhibits normal muscle tone. Coordination normal.  Skin: Skin is warm and dry.  Psychiatric: She has a normal mood and affect. Her behavior is normal. Judgment and thought content normal.    Labs reviewed: Basic Metabolic Panel: Recent Labs    01/23/17 0000 05/26/17 11/04/17 0000  NA 144 141 141  K 4.2 4.1 3.8  CL 106  --  106  CO2 32  --  28  GLUCOSE 109*  --  103*  BUN 21 15 11   CREATININE 0.83 0.8 0.73  CALCIUM 9.8  --  9.3   Liver Function Tests: Recent Labs    01/23/17 0000 05/26/17 11/04/17 0000  AST 14  14 17 16   ALT 10  10 10 9   ALKPHOS  --   47  --   BILITOT 0.5  0.5  --  0.5  PROT 6.4  6.4  --  6.0*   Recent Labs    11/04/17 0000  LIPASE 15  AMYLASE 19*   No results for input(s): AMMONIA in the last 8760 hours. CBC: Recent Labs    01/23/17 0000 05/26/17 11/04/17 0000  WBC 4.7 5.1 4.1  NEUTROABS  --   --  2,292  HGB 13.9 14.0 13.5  HCT 41.1 40 38.9  MCV 95.4  --  91.7  PLT 187 189 177   Lipid Panel: Recent Labs    11/04/17 0000  CHOL 219*  HDL 49*  LDLCALC 143*  TRIG 142  CHOLHDL 4.5   Lab Results  Component Value Date   HGBA1C 5.9 05/26/2017    Procedures since last visit: No results found.  Assessment/Plan Abdominal pain Persisted, 11/05/17 Lipase 15, Amylase 19, wbc 4.1, Hgb 13.5, plt 177, neutrophils 56%, Na 141, K 3.8, Ca 0/3, Bun 11, creat 0.73, TP 6.0, albumin 4.1, LFT wnl, 11/04/17 H pylori breath test-not detected.  Stopped Protonix due to AR of headache and she doesn't think there is any efficacy. Will refer to GI specialist, Dr. Lauro Franklin [address] [place], [place] [phone]  Constipation Stable, continue NULYTELY     Labs/tests ordered: none  Next appt:  4 months  Time spend 25 minutes.

## 2017-11-19 NOTE — Assessment & Plan Note (Signed)
Stable, continue NULYTELY

## 2017-11-19 NOTE — Assessment & Plan Note (Addendum)
Persisted, 11/05/17 Lipase 15, Amylase 19, wbc 4.1, Hgb 13.5, plt 177, neutrophils 56%, Na 141, K 3.8, Ca 0/3, Bun 11, creat 0.73, TP 6.0, albumin 4.1, LFT wnl, 11/04/17 H pylori breath test-not detected.  Stopped Protonix due to AR of headache and she doesn't think there is any efficacy. Will refer to GI specialist, Dr. Lauro Franklin [address] [place], [place] [phone]

## 2017-11-24 ENCOUNTER — Encounter: Payer: Self-pay | Admitting: Internal Medicine

## 2017-11-24 ENCOUNTER — Encounter: Payer: Self-pay | Admitting: Nurse Practitioner

## 2017-11-24 DIAGNOSIS — G40909 Epilepsy, unspecified, not intractable, without status epilepticus: Secondary | ICD-10-CM | POA: Diagnosis not present

## 2017-11-24 DIAGNOSIS — Z79899 Other long term (current) drug therapy: Secondary | ICD-10-CM | POA: Diagnosis not present

## 2017-12-07 ENCOUNTER — Other Ambulatory Visit: Payer: Self-pay | Admitting: Nurse Practitioner

## 2017-12-07 DIAGNOSIS — G40909 Epilepsy, unspecified, not intractable, without status epilepticus: Secondary | ICD-10-CM

## 2017-12-07 DIAGNOSIS — G4700 Insomnia, unspecified: Secondary | ICD-10-CM

## 2017-12-07 MED ORDER — CLONAZEPAM 0.5 MG PO TABS: 0.5000 mg | ORAL_TABLET | Freq: Every evening | ORAL | 0 refills | Status: DC | PRN

## 2017-12-07 NOTE — Telephone Encounter (Signed)
Please send it to me for approval form.

## 2017-12-07 NOTE — Telephone Encounter (Signed)
Please send the meds for approval form.

## 2017-12-08 ENCOUNTER — Other Ambulatory Visit: Payer: Self-pay | Admitting: Nurse Practitioner

## 2017-12-08 ENCOUNTER — Ambulatory Visit: Payer: Medicare Other | Admitting: Internal Medicine

## 2017-12-08 DIAGNOSIS — G4700 Insomnia, unspecified: Secondary | ICD-10-CM

## 2017-12-08 DIAGNOSIS — G40909 Epilepsy, unspecified, not intractable, without status epilepticus: Secondary | ICD-10-CM

## 2017-12-09 ENCOUNTER — Telehealth: Payer: Self-pay

## 2017-12-09 ENCOUNTER — Other Ambulatory Visit: Payer: Self-pay | Admitting: *Deleted

## 2017-12-09 DIAGNOSIS — G4700 Insomnia, unspecified: Secondary | ICD-10-CM

## 2017-12-09 DIAGNOSIS — G40909 Epilepsy, unspecified, not intractable, without status epilepticus: Secondary | ICD-10-CM

## 2017-12-09 MED ORDER — CLONAZEPAM 0.5 MG PO TABS: 0.5000 mg | ORAL_TABLET | Freq: Every evening | ORAL | 0 refills | Status: DC | PRN

## 2017-12-09 NOTE — Telephone Encounter (Signed)
Beverly with [facility] called to request a return call from [facility] or her assistant to address clonazepam concerns. Meriam Sprague states patient is scheduled for a 2:00 pm deliverly and it would be ideal if someone could clarify/return call sooner than later.   Message routed to [facility] an Jasmine December Engineer, civil (consulting))

## 2017-12-09 NOTE — Telephone Encounter (Signed)
Returned call to [employer]) to clarify medication refill. She stated that patient's medication will be sent out with the 2;00 pm delivery.

## 2017-12-10 ENCOUNTER — Encounter: Payer: Self-pay | Admitting: Nurse Practitioner

## 2017-12-19 ENCOUNTER — Other Ambulatory Visit: Payer: Self-pay | Admitting: Nurse Practitioner

## 2018-01-04 ENCOUNTER — Other Ambulatory Visit: Payer: Self-pay | Admitting: Nurse Practitioner

## 2018-01-04 DIAGNOSIS — G40909 Epilepsy, unspecified, not intractable, without status epilepticus: Secondary | ICD-10-CM

## 2018-01-05 ENCOUNTER — Other Ambulatory Visit: Payer: Self-pay | Admitting: Nurse Practitioner

## 2018-01-05 DIAGNOSIS — G40909 Epilepsy, unspecified, not intractable, without status epilepticus: Secondary | ICD-10-CM

## 2018-01-05 DIAGNOSIS — G4700 Insomnia, unspecified: Secondary | ICD-10-CM

## 2018-01-06 ENCOUNTER — Other Ambulatory Visit: Payer: Self-pay | Admitting: *Deleted

## 2018-01-06 ENCOUNTER — Ambulatory Visit (INDEPENDENT_AMBULATORY_CARE_PROVIDER_SITE_OTHER): Payer: Medicare Other | Admitting: *Deleted

## 2018-01-06 ENCOUNTER — Encounter: Payer: Self-pay | Admitting: Internal Medicine

## 2018-01-06 ENCOUNTER — Encounter (INDEPENDENT_AMBULATORY_CARE_PROVIDER_SITE_OTHER): Payer: Self-pay

## 2018-01-06 VITALS — BP 110/60 | HR 84 | Ht 65.0 in | Wt 112.6 lb

## 2018-01-06 DIAGNOSIS — G40909 Epilepsy, unspecified, not intractable, without status epilepticus: Secondary | ICD-10-CM

## 2018-01-06 DIAGNOSIS — K5909 Other constipation: Secondary | ICD-10-CM | POA: Diagnosis not present

## 2018-01-06 DIAGNOSIS — R1013 Epigastric pain: Secondary | ICD-10-CM | POA: Diagnosis not present

## 2018-01-06 DIAGNOSIS — G4700 Insomnia, unspecified: Secondary | ICD-10-CM

## 2018-01-06 NOTE — Patient Instructions (Addendum)
You have been scheduled for an endoscopy. Please follow written instructions given to you at your visit today. If you use inhalers (even only as needed), please bring them with you on the day of your procedure.   You have been scheduled for an abdominal ultrasound at [facility] on 01/12/18 at 9:30AM. Please arrive 20 minutes prior to your appointment for registration. Make certain not to have anything to eat or drink 6 hours prior to your appointment. Should you need to reschedule your appointment, please contact radiology at [phone]. This test typically takes about 30 minutes to perform.   We are going to obtain your records from [facility] for review.   I appreciate the opportunity to care for you. Stan Headarl Gessner, MD,FACG

## 2018-01-06 NOTE — Progress Notes (Signed)
Erin Ochoa 77 y.o. 04/08/1940 [id number]  Assessment & Plan:   Encounter Diagnoses  Name Primary?  . Abdominal pain, epigastric Yes  . Chronic constipation     cause of this pain pain is not clear.  H. pylori negative.  Gallstones are possible.  Cancer seems unlikely given lack of weight loss.  Same for mesenteric ischemia.  There is a fair amount of underlying anxiety.  She is not on a PPI we will hold off on that until endoscopy is done.  Breath test for H. pylori was negative could consider repeat biopsies.  I wonder if she could have celiac disease though I suspect the small bowel biopsy she had before were actually from the duodenum as features of sprue are not identified was listed in the report.  She had patchy increased intraepithelial lymphocytes and the indication was clearly diarrhea though she does not remember that.  Evaluate with complete abdominal ultrasound and upper GI endoscopy.The risks and benefits as well as alternatives of endoscopic procedure(s) have been discussed and reviewed. All questions answered. The patient agrees to proceed.  She will continue her current treatment regimen for chronic constipation.  I will review her previous endoscopic evaluation we have requested the reports.  I appreciate the opportunity to care for this patient. CC: Mast, Man X, NP   Subjective:   Chief Complaint: Epigastric pain chronic constipation  HPI The patient is a 77 year old widowed white woman who has had a several month, since 2022-07-22, history of a stomachache after eating.  Epigastric pain radiating through to the back.  It occurs very soon after eating.  Her appetite is good and her weight has fluctuated slightly but there is no trending weight loss.  She  has wondered if she has had an ulcer or if it could be related to her osteoporosis.  In September she had an H. pylori breath test that was negative as well as a CBC and metabolic panel.  He also has chronic  constipation and had been on Nulytely for a number of years at the recommendation of Dr. Kendrick Ranch after anal fissure surgery, and decided to stop that because she thought it might of been related to her pain or perhaps her nurse practitioner her PCP stopped it.  She then became very constipated again and is now on Gavalite which is an equivalent colonoscopy prep which she drinks about 4 ounces a day and has regular defecation.  Her hemorrhoids swelled and were bothering her but they are improving with resumption of normal bowel activity on her colonoscopy prep solution and using some Preparation H.  I can see from care everywhere that in 2010-07-22 she had biopsies of her colon that were negative, it looks like they were evaluating diarrhea though she is where she did not have any the indication was rule out microscopic or collagenous colitis.  There are also small bowel biopsies done though she does not think she had an EGD, and it does not specify whether it was duodenum or not.  We will request the report of the colonoscopy and any other endoscopy procedures, Dr. Vernell Barrier was the gastroenterologist.  Admittedly is stressed about downsizing at her retirement community recently and also a recent diagnosis of macular degeneration.  She worked until she was 15 and does not regret that but soon after she and her husband retired her husband became ill and eventually died in July 22, 2015 after multiple health problems that came on around the time of retirement. Allergies  Allergen Reactions  . Cat Hair Extract Other (See Comments)    Patient states that it causes her eyes to swell shut  . Cefaclor Other (See Comments)    unknown  . Codeine Nausea Only  . Other Nausea Only    Unknown allergy Pain medications, Pt states all kinds of pain medications she is unable to take except tylenol  . Oxycodone-Acetaminophen Nausea And Vomiting  . Penicillins Itching   Current Meds  Medication Sig  . Calcium 600-400 MG-UNIT CHEW  Chew by mouth.  Marland Kitchen GAVILYTE-N WITH FLAVOR PACK 420 g solution MIX WITH WATER AND DRINK 3-3 1/2 OUNCES DAILY AS DIRECTED.  . Multiple Vitamins-Minerals (PRESERVISION AREDS PO) Take 2 tablets by mouth.  . TEGRETOL 200 MG tablet TAKE 1 & 1/2 TABLETS ONCE DAILY.  Marland Kitchen Vitamin D, Ergocalciferol, (DRISDOL) 50000 units CAPS capsule TAKE 1 CAPSULE WEEKLY.  . [DISCONTINUED] clonazePAM (KLONOPIN) 0.5 MG tablet Take 1 tablet (0.5 mg total) by mouth at bedtime as needed for anxiety.   Past Medical History:  Diagnosis Date  . Chronic constipation   . Osteoporosis   . Seizures ([name])    epilepsy   Past Surgical History:  Procedure Laterality Date  . ABDOMINAL HYSTERECTOMY  1995   Dr. Carlis Abbott  . CATARACT EXTRACTION Bilateral 12/2016  . COLONOSCOPY  2012  . FISSURECTOMY    . WRIST SURGERY Left    due to fracture   Social History   Social History Narrative   Social History     Marital status: Widowed           Spouse name:                        Years of education:  1 year college              Number of children:0           Widowed no children      Occupational History: Editor, commissioning, Adm. Therapist, music care, [employer].)     None on file      Social History Main Topics     Smoking status: Former Smoker                                             Packs/day: 1.00      Years: 0.00            Types: Cigarettes        Smokeless tobacco: Not on file                        Alcohol use: No               Drug use: No               Sexual activity: Not on file            Does not drink caffeine, but does eat chocolate.   Does live in an apartment ([address]) retirement community does exercise : walks daily   Right handed         family history includes Alcohol abuse in her father; Diabetes in her father, maternal grandmother, and paternal grandmother.   Review of Systems As per HPI macular degeneration recently diagnosed chronic insomnia anxiety and back pain  negative  Objective:   Physical Exam @BP  110/60   Pulse 84   Ht 5\' 5"  (1.651 m)   Wt 112 lb 9.6 oz (51.1 kg)   BMI 18.74 kg/m @  General:  Well-developed, well-nourished and in no acute distress she is thin Eyes:  anicteric. ENT:   Mouth and posterior pharynx free of lesions.  Neck:   supple w/o thyromegaly or mass.  Lungs: Clear to auscultation bilaterally. Heart:  S1S2, no rubs, murmurs, gallops. Abdomen:  soft, non-tender, no hepatosplenomegaly, hernia, or mass and BS+.    Lymph:  no cervical or supraclavicular adenopathy. Extremities:   no edema, cyanosis or clubbing Skin   no rash. Neuro:  A&O x 3.  Psych:  appropriate mood and  Affect.   Data Reviewed: See HPI

## 2018-01-07 MED ORDER — CLONAZEPAM 0.5 MG PO TABS: ORAL_TABLET | ORAL | 0 refills | Status: DC

## 2018-01-12 ENCOUNTER — Ambulatory Visit
Admission: RE | Admit: 2018-01-12 | Discharge: 2018-01-12 | Disposition: A | Discharge status: Home / Self Care | Payer: Medicare Other | Source: Ambulatory Visit | Attending: Internal Medicine | Admitting: Internal Medicine

## 2018-01-12 DIAGNOSIS — R1013 Epigastric pain: Secondary | ICD-10-CM

## 2018-01-13 ENCOUNTER — Encounter: Payer: Self-pay | Admitting: Nurse Practitioner

## 2018-01-13 DIAGNOSIS — K7689 Other specified diseases of liver: Secondary | ICD-10-CM | POA: Insufficient documentation

## 2018-01-13 DIAGNOSIS — N281 Cyst of kidney, acquired: Secondary | ICD-10-CM | POA: Insufficient documentation

## 2018-01-18 ENCOUNTER — Other Ambulatory Visit: Payer: Self-pay

## 2018-01-18 ENCOUNTER — Encounter: Payer: Self-pay | Admitting: Internal Medicine

## 2018-01-18 ENCOUNTER — Ambulatory Visit (AMBULATORY_SURGERY_CENTER): Payer: Medicare Other | Admitting: Internal Medicine

## 2018-01-18 VITALS — BP 104/59 | HR 72 | Temp 98.9°F | Resp 12 | Ht 65.0 in | Wt 112.0 lb

## 2018-01-18 DIAGNOSIS — K3189 Other diseases of stomach and duodenum: Secondary | ICD-10-CM

## 2018-01-18 DIAGNOSIS — G40909 Epilepsy, unspecified, not intractable, without status epilepticus: Secondary | ICD-10-CM | POA: Diagnosis not present

## 2018-01-18 DIAGNOSIS — R1013 Epigastric pain: Secondary | ICD-10-CM

## 2018-01-18 DIAGNOSIS — K2970 Gastritis, unspecified, without bleeding: Secondary | ICD-10-CM | POA: Diagnosis not present

## 2018-01-18 DIAGNOSIS — K317 Polyp of stomach and duodenum: Secondary | ICD-10-CM

## 2018-01-18 DIAGNOSIS — R109 Unspecified abdominal pain: Secondary | ICD-10-CM | POA: Diagnosis not present

## 2018-01-18 DIAGNOSIS — K2980 Duodenitis without bleeding: Secondary | ICD-10-CM | POA: Diagnosis not present

## 2018-01-18 DIAGNOSIS — K259 Gastric ulcer, unspecified as acute or chronic, without hemorrhage or perforation: Secondary | ICD-10-CM | POA: Diagnosis not present

## 2018-01-18 MED ORDER — SODIUM CHLORIDE 0.9 % IV SOLN: 500.0000 mL | Freq: Once | INTRAVENOUS | Status: DC

## 2018-01-18 NOTE — Patient Instructions (Addendum)
I took stomach and duodenal (upper intestine) biopsies.  There were some erosions and tiny polyps in the stomach. I do not think the polyps are related to your symptoms and suspect they are totally innocent.  Let us see what the biopsies show and will determine a treatment plan.  I appreciate the opportunity to care for you. Iva Booparl E. Gessner, MD, Clementeen GrahamFACG  Handouts:  Gastritis and Polyps  YOU HAD AN ENDOSCOPIC PROCEDURE TODAY AT THE [facility]:   Refer to the procedure report that was given to you for any specific questions about what was found during the examination.  If the procedure report does not answer your questions, please call your gastroenterologist to clarify.  If you requested that your care partner not be given the details of your procedure findings, then the procedure report has been included in a sealed envelope for you to review at your convenience later.  YOU SHOULD EXPECT: Some feelings of bloating in the abdomen. Passage of more gas than usual.  Walking can help get rid of the air that was put into your GI tract during the procedure and reduce the bloating. If you had a lower endoscopy (such as a colonoscopy or flexible sigmoidoscopy) you may notice spotting of blood in your stool or on the toilet paper. If you underwent a bowel prep for your procedure, you may not have a normal bowel movement for a few days.  Please Note:  You might notice some irritation and congestion in your nose or some drainage.  This is from the oxygen used during your procedure.  There is no need for concern and it should clear up in a day or so.  SYMPTOMS TO REPORT IMMEDIATELY:   Following upper endoscopy (EGD)  Vomiting of blood or coffee ground material  New chest pain or pain under the shoulder blades  Painful or persistently difficult swallowing  New shortness of breath  Fever of 100F or higher  Black, tarry-looking stools  For urgent or emergent issues, a  gastroenterologist can be reached at any hour by calling [phone].   DIET:  We do recommend a small meal at first, but then you may proceed to your regular diet.  Drink plenty of fluids but you should avoid alcoholic beverages for 24 hours.  ACTIVITY:  You should plan to take it easy for the rest of today and you should NOT DRIVE or use heavy machinery until tomorrow (because of the sedation medicines used during the test).    FOLLOW UP: Our staff will call the number listed on your records the next business day following your procedure to check on you and address any questions or concerns that you may have regarding the information given to you following your procedure. If we do not reach you, we will leave a message.  However, if you are feeling well and you are not experiencing any problems, there is no need to return our call.  We will assume that you have returned to your regular daily activities without incident.  If any biopsies were taken you will be contacted by phone or by letter within the next 1-3 weeks.  Please call us at [phone] if you have not heard about the biopsies in 3 weeks.    SIGNATURES/CONFIDENTIALITY: You and/or your care partner have signed paperwork which will be entered into your electronic medical record.  These signatures attest to the fact that that the information above on your After Visit Summary has been  reviewed and is understood.  Full responsibility of the confidentiality of this discharge information lies with you and/or your care-partner. 

## 2018-01-18 NOTE — Progress Notes (Signed)
To recovery, report to RN, VSS. 

## 2018-01-18 NOTE — Op Note (Addendum)
[facility] Patient Name: Erin Ochoa Procedure Date: 01/18/2018 9:53 AM MRN: [id number] Endoscopist: Iva Boop , MD Age: 77 Referring MD:  Date of Birth: 06-17-1940 Gender: Female Account #: [id number] Procedure:                Upper GI endoscopy Indications:              Epigastric abdominal pain Medicines:                Propofol per Anesthesia, Monitored Anesthesia Care Procedure:                Pre-Anesthesia Assessment:                           - Prior to the procedure, a History and Physical                            was performed, and patient medications and                            allergies were reviewed. The patient's tolerance of                            previous anesthesia was also reviewed. The risks                            and benefits of the procedure and the sedation                            options and risks were discussed with the patient.                            All questions were answered, and informed consent                            was obtained. Prior Anticoagulants: The patient has                            taken no previous anticoagulant or antiplatelet                            agents. ASA Grade Assessment: II - A patient with                            mild systemic disease. After reviewing the risks                            and benefits, the patient was deemed in                            satisfactory condition to undergo the procedure.                           After obtaining informed consent, the endoscope was  passed under direct vision. Throughout the                            procedure, the patient's blood pressure, pulse, and                            oxygen saturations were monitored continuously. The                            Model GIF-HQ190 [id number]) scope was introduced                            through the mouth, and advanced to the second part                            of  duodenum. The upper GI endoscopy was                            accomplished without difficulty. The patient                            tolerated the procedure well. Scope In: Scope Out: Findings:                 Multiple dispersed, diminutive non-bleeding                            erosions were found in the gastric body and in the                            gastric antrum. There were no stigmata of recent                            bleeding. Biopsies were taken with a cold forceps                            for histology. Verification of patient                            identification for the specimen was done. Estimated                            blood loss was minimal.                           Multiple diminutive semi-sessile polyps with no                            stigmata of recent bleeding were found in the                            gastric fundus and in the gastric body. Biopsies                            were taken  with a cold forceps for histology.                            Verification of patient identification for the                            specimen was done. Estimated blood loss was minimal.                           ?Scalloped mucosa was found in the second portion                            of the duodenum. Biopsies for histology were taken                            with a cold forceps for evaluation of celiac                            disease. Verification of patient identification for                            the specimen was done. Estimated blood loss was                            minimal.                           The exam was otherwise without abnormality.                           The cardia and gastric fundus were otherwise normal                            on retroflexion. Complications:            No immediate complications. Estimated Blood Loss:     Estimated blood loss was minimal. Impression:               - Non-bleeding erosive gastropathy.  Biopsied.                            Scattered tiny erosions body and antrum                           - Multiple gastric polyps. Biopsied. tiny and look                            like innocent fundic gland polyps                           - ?Scalloped mucosa was found in the duodenum, rule                            out celiac disease. Biopsied.                           -  The examination was otherwise normal. Recommendation:           - Patient has a contact number available for                            emergencies. The signs and symptoms of potential                            delayed complications were discussed with the                            patient. Return to normal activities tomorrow.                            Written discharge instructions were provided to the                            patient.                           - Resume previous diet.                           - Continue present medications.                           - Await pathology results.                           / needs evaluation of T spine - having back pain                            also ? referred vs primary Iva Booparl E Gessner, MD 01/18/2018 10:16:37 AM This report has been signed electronically.

## 2018-01-18 NOTE — Progress Notes (Signed)
Called to room to assist during endoscopic procedure.  Patient ID and intended procedure confirmed with present staff. Received instructions for my participation in the procedure from the performing physician.  

## 2018-01-19 ENCOUNTER — Other Ambulatory Visit: Payer: Self-pay | Admitting: Nurse Practitioner

## 2018-01-19 ENCOUNTER — Telehealth: Payer: Self-pay | Admitting: *Deleted

## 2018-01-19 NOTE — Telephone Encounter (Signed)
  Follow up Call-  Call back number 01/18/2018  Post procedure Call Back phone  # [phone]  Permission to leave phone message Yes  Some recent data might be hidden     Patient questions:  Do you have a fever, pain , or abdominal swelling? No. Pain Score  0 *  Have you tolerated food without any problems? Yes.    Have you been able to return to your normal activities? Yes.    Do you have any questions about your discharge instructions: Diet   No. Medications  No. Follow up visit  No.  Do you have questions or concerns about your Care? No.  Actions: * If pain score is 4 or above: No action needed, pain <4.

## 2018-01-19 NOTE — Progress Notes (Signed)
Results reviewed in person at EGD  Needs a limited RUQ US recall 6 months to f/u septated cyst in liver

## 2018-01-26 ENCOUNTER — Other Ambulatory Visit: Payer: Self-pay

## 2018-01-26 MED ORDER — OMEPRAZOLE 20 MG PO CPDR: 20.0000 mg | DELAYED_RELEASE_CAPSULE | Freq: Every day | ORAL | 1 refills | Status: DC

## 2018-01-26 NOTE — Progress Notes (Signed)
[facility] - no letter/recall  Office  Call patient - biopsies show inflammation in stomach and intestine that I think are from acid  Gastric polyps benign and not an issue  Start omeprazole 20 mg po qd before breakfast # 90 no refill  See me in February - she will need to call in jan to get an appointment

## 2018-01-27 ENCOUNTER — Telehealth: Payer: Self-pay | Admitting: Internal Medicine

## 2018-01-27 NOTE — Telephone Encounter (Signed)
Patient states per her new prescription of medication omeprazole she is suppose to take it before meals including breakfast. Patient states for years she has been taking gavilyte before breakfast to help her use the restroom. Patient wants to know what to do since she can not do both before breakfast.

## 2018-01-28 NOTE — Telephone Encounter (Signed)
Spoke to patient and informed her it is ok to take Omeprazole at least 30 minutes before her food in the morning even if she is taking the gavilyte. Patient verbalized understanding.

## 2018-01-30 DIAGNOSIS — J00 Acute nasopharyngitis [common cold]: Secondary | ICD-10-CM | POA: Diagnosis not present

## 2018-02-05 ENCOUNTER — Other Ambulatory Visit: Payer: Self-pay | Admitting: Nurse Practitioner

## 2018-02-05 DIAGNOSIS — G40909 Epilepsy, unspecified, not intractable, without status epilepticus: Secondary | ICD-10-CM

## 2018-02-05 DIAGNOSIS — G4700 Insomnia, unspecified: Secondary | ICD-10-CM

## 2018-02-18 ENCOUNTER — Encounter: Payer: Self-pay | Admitting: Internal Medicine

## 2018-02-23 ENCOUNTER — Other Ambulatory Visit: Payer: Self-pay | Admitting: Nurse Practitioner

## 2018-03-09 ENCOUNTER — Other Ambulatory Visit: Payer: Self-pay | Admitting: Nurse Practitioner

## 2018-03-09 DIAGNOSIS — G4700 Insomnia, unspecified: Secondary | ICD-10-CM

## 2018-03-09 DIAGNOSIS — G40909 Epilepsy, unspecified, not intractable, without status epilepticus: Secondary | ICD-10-CM

## 2018-03-10 ENCOUNTER — Other Ambulatory Visit: Payer: Self-pay | Admitting: *Deleted

## 2018-03-10 DIAGNOSIS — G40909 Epilepsy, unspecified, not intractable, without status epilepticus: Secondary | ICD-10-CM

## 2018-03-10 DIAGNOSIS — G4700 Insomnia, unspecified: Secondary | ICD-10-CM

## 2018-03-10 MED ORDER — CLONAZEPAM 0.5 MG PO TABS: ORAL_TABLET | ORAL | 1 refills | Status: DC

## 2018-03-24 ENCOUNTER — Other Ambulatory Visit: Payer: Self-pay | Admitting: Nurse Practitioner

## 2018-03-24 DIAGNOSIS — G40909 Epilepsy, unspecified, not intractable, without status epilepticus: Secondary | ICD-10-CM

## 2018-03-25 ENCOUNTER — Non-Acute Institutional Stay: Payer: Medicare Other | Admitting: Nurse Practitioner

## 2018-03-25 ENCOUNTER — Encounter: Payer: Self-pay | Admitting: Nurse Practitioner

## 2018-03-25 DIAGNOSIS — K5900 Constipation, unspecified: Secondary | ICD-10-CM

## 2018-03-25 DIAGNOSIS — F419 Anxiety disorder, unspecified: Secondary | ICD-10-CM

## 2018-03-25 DIAGNOSIS — G40822 Epileptic spasms, not intractable, without status epilepticus: Secondary | ICD-10-CM

## 2018-03-25 DIAGNOSIS — K3189 Other diseases of stomach and duodenum: Secondary | ICD-10-CM | POA: Insufficient documentation

## 2018-03-25 NOTE — Assessment & Plan Note (Signed)
Stable,  Continue Gavilyte prn

## 2018-03-25 NOTE — Assessment & Plan Note (Signed)
Stable, continue Tegretol 200mg  qd, Clonazepam 0.5mg  prn.

## 2018-03-25 NOTE — Assessment & Plan Note (Signed)
Stable, continue Omeprazole 20mg qd.  

## 2018-03-25 NOTE — Progress Notes (Signed)
Location:   clinic [facility]   Place of Service:  Clinic (12) Provider: Chipper OmanManXie Drusilla Wampole NP  Code Status: DNR Goals of Care: [place] 03/13/2017  Does Patient Have a Medical Advance Directive? Yes  Type of Estate agentAdvance Directive Healthcare Power of RodessaAttorney;Living will  Does patient want to make changes to medical advance directive? No - Patient declined  Copy of Healthcare Power of Attorney in Chart? Yes     Chief Complaint  Patient presents with  . Medical Management of Chronic Issues    4 mo f/u    HPI: Patient is a 78 y.o. female seen today for medical management of chronic diseases.      The patient has history of seizure, no active seizure activities since last visited, on Tegretol 200mg  qd, Clonazepam 0.5mg  prn. Constipation, stable, on Gavilyte prn. GERD stable on Omeprazole 20mg  qd.    Past Medical History:  Diagnosis Date  . Anxiety   . Cataract   . Chronic constipation   . Depression   . Osteoporosis   . Seizures ([name])    epilepsy  last seizure 1977    Past Surgical History:  Procedure Laterality Date  . ABDOMINAL HYSTERECTOMY  1995   Dr. Carlis AbbottVan Fletcher  . CATARACT EXTRACTION Bilateral 12/2016  . COLONOSCOPY  2012   patchy increased intraepithelial lymphocytes - draelos  . ESOPHAGOGASTRODUODENOSCOPY     multiple  . FISSURECTOMY    . WRIST SURGERY Left    due to fracture    Allergies  Allergen Reactions  . Cat Hair Extract Other (See Comments)    Patient states that it causes her eyes to swell shut  . Cefaclor Other (See Comments)    unknown  . Codeine Nausea Only  . Iodinated Diagnostic Agents Other (See Comments)    Unknown allergy Unknown allergy   . Other Nausea Only    Unknown allergy Pain medications, Pt states all kinds of pain medications she is unable to take except tylenol  . Oxycodone-Acetaminophen Nausea And Vomiting  . Penicillins Itching    Allergies as of 03/25/2018      Reactions   Cat Hair Extract Other (See Comments)   Patient states that it causes her eyes to swell shut   Cefaclor Other (See Comments)   unknown   Codeine Nausea Only   Iodinated Diagnostic Agents Other (See Comments)   Unknown allergy Unknown allergy   Other Nausea Only   Unknown allergy Pain medications, Pt states all kinds of pain medications she is unable to take except tylenol   Oxycodone-acetaminophen Nausea And Vomiting   Penicillins Itching      Medication List       Accurate as of March 25, 2018 11:59 PM. Always use your most recent med list.        Calcium 600-400 MG-UNIT Chew Chew by mouth.   clonazePAM 0.5 MG tablet Commonly known as:  KLONOPIN Take 1& 1/2 tablets at bedtime as needed for sleep.   GAVILYTE-N WITH FLAVOR PACK 420 g solution Generic drug:  polyethylene glycol-electrolytes MIX WITH WATER AND DRINK 3-3 1/2 OUNCES DAILY AS DIRECTED.   omeprazole 20 MG capsule Commonly known as:  PRILOSEC Take 1 capsule (20 mg total) by mouth daily before breakfast.   PRESERVISION AREDS PO Take 2 tablets by mouth.   RECLAST 5 MG/100ML Soln injection Generic drug:  zoledronic acid Inject 5 mg into the vein once. Every other year per patient   TEGRETOL 200 MG tablet Generic drug:  carbamazepine TAKE 1 & 1/2 TABLETS ONCE DAILY.   Vitamin D (Ergocalciferol) 1.25 MG (50000 UT) Caps capsule Commonly known as:  DRISDOL TAKE 1 CAPSULE WEEKLY.       Review of Systems:  Review of Systems  Constitutional: Negative for activity change, appetite change, chills, diaphoresis, fatigue and fever.  HENT: Positive for hearing loss. Negative for congestion and voice change.   Respiratory: Negative for cough and shortness of breath.   Cardiovascular: Negative for chest pain, palpitations and leg swelling.  Gastrointestinal: Negative for abdominal distention, abdominal pain, constipation, diarrhea, nausea and vomiting.  Genitourinary: Negative for difficulty urinating, dysuria and urgency.  Musculoskeletal: Positive  for gait problem.  Skin: Negative for color change and pallor.  Neurological: Negative for dizziness, speech difficulty, weakness and headaches.  Psychiatric/Behavioral: Negative for agitation and behavioral problems.    Health Maintenance  Topic Date Due  . FOOT EXAM  07/06/1950  . OPHTHALMOLOGY EXAM  07/06/1950  . URINE MICROALBUMIN  07/06/1950  . MAMMOGRAM  08/13/2017  . HEMOGLOBIN A1C  11/25/2017  . TETANUS/TDAP  08/21/2027  . INFLUENZA VACCINE  Completed  . DEXA SCAN  Completed  . PNA vac Low Risk Adult  Completed    Physical Exam: Vitals:   03/25/18 1327  BP: 106/70  Pulse: 76  Resp: 18  Temp: 98 F (36.7 C)  SpO2: 97%  Weight: 113 lb 6.4 oz (51.4 kg)  Height: 5\' 5"  (1.651 m)   Body mass index is 18.87 kg/m. Physical Exam Constitutional:      Appearance: Normal appearance.  HENT:     Head: Normocephalic and atraumatic.     Nose: Nose normal.     Mouth/Throat:     Mouth: Mucous membranes are moist.  Eyes:     Extraocular Movements: Extraocular movements intact.     Pupils: Pupils are equal, round, and reactive to light.  Neck:     Musculoskeletal: Normal range of motion and neck supple.  Cardiovascular:     Rate and Rhythm: Normal rate and regular rhythm.     Heart sounds: No murmur.  Pulmonary:     Effort: Pulmonary effort is normal.     Breath sounds: No wheezing, rhonchi or rales.  Abdominal:     General: There is no distension.     Palpations: Abdomen is soft.     Tenderness: There is no abdominal tenderness. There is no guarding or rebound.  Musculoskeletal:     Right lower leg: No edema.     Left lower leg: No edema.  Skin:    General: Skin is warm and dry.  Neurological:     General: No focal deficit present.     Mental Status: She is alert and oriented to person, place, and time. Mental status is at baseline.     Motor: No weakness.     Coordination: Coordination normal.     Gait: Gait abnormal.  Psychiatric:        Mood and Affect:  Mood normal.        Behavior: Behavior normal.        Thought Content: Thought content normal.        Judgment: Judgment normal.     Labs reviewed: Basic Metabolic Panel: Recent Labs    05/26/17 11/04/17 0000  NA 141 141  K 4.1 3.8  CL  --  106  CO2  --  28  GLUCOSE  --  103*  BUN 15 11  CREATININE 0.8 0.73  CALCIUM  --  9.3   Liver Function Tests: Recent Labs    05/26/17 11/04/17 0000  AST 17 16  ALT 10 9  ALKPHOS 47  --   BILITOT  --  0.5  PROT  --  6.0*   Recent Labs    11/04/17 0000  LIPASE 15  AMYLASE 19*   No results for input(s): AMMONIA in the last 8760 hours. CBC: Recent Labs    05/26/17 11/04/17 0000  WBC 5.1 4.1  NEUTROABS  --  2,292  HGB 14.0 13.5  HCT 40 38.9  MCV  --  91.7  PLT 189 177   Lipid Panel: Recent Labs    11/04/17 0000  CHOL 219*  HDL 49*  LDLCALC 143*  TRIG 142  CHOLHDL 4.5   Lab Results  Component Value Date   HGBA1C 5.9 05/26/2017    Procedures since last visit: No results found.  Assessment/Plan  Constipation Stable,  Continue Gavilyte prn  Erosive gastropathy Stable, continue Omeprazole 20mg  qd.   Epilepsy without status epilepticus, not intractable ([name]) Stable, continue Tegretol 200mg  qd, Clonazepam 0.5mg  prn.  Anxiety Long standing use of Clonazepam for sleep, GDR is recommended. Her husband passed away about 3 years ago. She declined other psychotropic agent for mood.    Labs/tests ordered: none  Next appt:  4 months.

## 2018-03-25 NOTE — Patient Instructions (Signed)
F/u in clinic in 4 months.

## 2018-03-25 NOTE — Assessment & Plan Note (Addendum)
Long standing use of Clonazepam for sleep, GDR is recommended. Her husband passed away about 3 years ago. She declined other psychotropic agent for mood.

## 2018-03-26 ENCOUNTER — Other Ambulatory Visit: Payer: Self-pay | Admitting: *Deleted

## 2018-03-26 DIAGNOSIS — G40909 Epilepsy, unspecified, not intractable, without status epilepticus: Secondary | ICD-10-CM

## 2018-03-26 MED ORDER — TEGRETOL 200 MG PO TABS: ORAL_TABLET | ORAL | 3 refills | Status: DC

## 2018-04-08 ENCOUNTER — Other Ambulatory Visit: Payer: Self-pay | Admitting: *Deleted

## 2018-04-08 DIAGNOSIS — M810 Age-related osteoporosis without current pathological fracture: Secondary | ICD-10-CM

## 2018-04-13 ENCOUNTER — Other Ambulatory Visit: Payer: Medicare Other

## 2018-04-13 DIAGNOSIS — M810 Age-related osteoporosis without current pathological fracture: Secondary | ICD-10-CM | POA: Diagnosis not present

## 2018-04-13 LAB — CREATININE, SERUM: Creat: 0.67 mg/dL (ref 0.60–0.93)

## 2018-04-13 LAB — CALCIUM: CALCIUM: 9.2 mg/dL (ref 8.6–10.4)

## 2018-04-26 ENCOUNTER — Other Ambulatory Visit (INDEPENDENT_AMBULATORY_CARE_PROVIDER_SITE_OTHER): Payer: Medicare Other

## 2018-04-26 ENCOUNTER — Encounter: Payer: Self-pay | Admitting: Internal Medicine

## 2018-04-26 ENCOUNTER — Ambulatory Visit (INDEPENDENT_AMBULATORY_CARE_PROVIDER_SITE_OTHER): Payer: Medicare Other | Admitting: Internal Medicine

## 2018-04-26 VITALS — BP 106/64 | HR 86 | Ht 67.0 in | Wt 114.0 lb

## 2018-04-26 DIAGNOSIS — R1013 Epigastric pain: Secondary | ICD-10-CM

## 2018-04-26 DIAGNOSIS — M546 Pain in thoracic spine: Secondary | ICD-10-CM | POA: Diagnosis not present

## 2018-04-26 DIAGNOSIS — K2960 Other gastritis without bleeding: Secondary | ICD-10-CM

## 2018-04-26 DIAGNOSIS — Z91041 Radiographic dye allergy status: Secondary | ICD-10-CM | POA: Diagnosis not present

## 2018-04-26 LAB — COMPREHENSIVE METABOLIC PANEL
ALT: 10 U/L (ref 0–35)
AST: 16 U/L (ref 0–37)
Albumin: 4.6 g/dL (ref 3.5–5.2)
Alkaline Phosphatase: 53 U/L (ref 39–117)
BUN: 16 mg/dL (ref 6–23)
CHLORIDE: 103 meq/L (ref 96–112)
CO2: 31 meq/L (ref 19–32)
Calcium: 9.6 mg/dL (ref 8.4–10.5)
Creatinine, Ser: 0.71 mg/dL (ref 0.40–1.20)
GFR: 79.66 mL/min (ref >60.00)
Glucose, Bld: 138 mg/dL — ABNORMAL HIGH (ref 70–99)
Potassium: 3.9 mEq/L (ref 3.5–5.1)
Sodium: 141 mEq/L (ref 135–145)
Total Bilirubin: 0.4 mg/dL (ref 0.2–1.2)
Total Protein: 6.9 g/dL (ref 6.0–8.3)

## 2018-04-26 MED ORDER — PREDNISONE 50 MG PO TABS: ORAL_TABLET | ORAL | 0 refills | Status: DC

## 2018-04-26 MED ORDER — DIPHENHYDRAMINE HCL 50 MG PO TABS: ORAL_TABLET | ORAL | 0 refills | Status: DC

## 2018-04-26 MED ORDER — OMEPRAZOLE 40 MG PO CPDR: 40.0000 mg | DELAYED_RELEASE_CAPSULE | Freq: Every day | ORAL | 3 refills | Status: DC

## 2018-04-26 NOTE — Patient Instructions (Addendum)
Your provider has requested that you go to the basement level for lab work before leaving today. Press "B" on the elevator. The lab is located at the first door on the left as you exit the elevator.  We have sent the following medications to your pharmacy for you to pick up at your convenience: Omeprazole 80m, Benadryl and Prednisone   You have been scheduled for a CT [facility] the [facility] on 04[facility]/2018 at 2:00pm. You should arrive 15 minutes prior to your appointment time for registration. Please follow the written instructions below on the day of your exam:  WARNING: IF YOU ARE ALLERGIC TO IODINE/X-RAY DYE, PLEASE NOTIFY RADIOLOGY IMMEDIATELY AT [phone] YOU WILL BE GIVEN A 13 HOUR PREMEDICATION PREP.  1) Do not eat or drink anything after 10:00AM (4 hours prior to your test) 2) You have been given 2 bottles of oral contrast to drink. The solution may taste better if refrigerated, but do NOT add ice or any other liquid to this solution. Shake well before drinking.    Drink 1 bottle of contrast @ 12:00PM (2 hours prior to your exam)  Drink 1 bottle of contrast @ 1:00PM (1 hour prior to your exam)  You may take any medications as prescribed with a small amount of water, if necessary. If you take any of the following medications: METFORMIN, GLUCOPHAGE, GLUCOVANCE, AVANDAMET, RIOMET, FORTAMET, AWhitwellMET, JANUMET, GLUMETZA or METAGLIP, you MAY be asked to HOLD this medication 48 hours AFTER the exam.  The purpose of you drinking the oral contrast is to aid in the visualization of your intestinal tract. The contrast solution may cause some diarrhea. Depending on your individual set of symptoms, you may also receive an intravenous injection of x-ray contrast/dye. [facility] or [facility], depending on the type of exam you are having performed.  This test typically takes 30-45 minutes to complete.  If  you have any questions regarding your exam or if you need to reschedule, you may call the CT department at [phone] the hours of 8:00 am and 5:00 pm, Monday-Friday.  ________________________________________________________________________    I appreciate the opportunity to care for you. CSilvano Rusk MD, [facility]

## 2018-04-26 NOTE — Progress Notes (Signed)
Erin Ochoa 78 y.o. 1940/06/05 [id number]  Assessment & Plan:   Encounter Diagnoses  Name Primary?  . Abdominal pain, epigastric Yes  . Thoracic back pain, unspecified back pain laterality, unspecified chronicity   . Erosive gastritis   . Allergic to IV contrast    Evaluate with a comprehensive metabolic panel, CT scan of the abdomen and pelvis.  Increased omeprazole to 40 mg daily.  She has a contrast allergy so we will follow the protocol to ameliorate that.  It may be that she needs a higher dose of acid suppression only but in her age group and with the symptoms I have some concern about a pancreatic abnormality.  That has not yet excluded.  She has known osteoporosis but does not seem to have a clinical scenario compatible with a compression fracture.  She may need thoracic spine evaluation, however.  I appreciate the opportunity to care for this patient. CC: Mast, Man X, NP   Subjective:   Chief Complaint: Epigastric pain  HPI Patient is here for follow-up, she was seen in the fall and scoped in early December with an erosive gastritis and fundic gland polyps noted.  She also had a peptic duodenitis.  She was started on omeprazole 20 mg daily and her epigastric pain that radiates into the back improved initially but now it has recurred.  It seems to occur often after lunch.  It is not every day at this point but it is still there.  Previous labs were unrevealing, she had had ultrasonography of the abdomen that demonstrated a small liver cyst, but no other abnormalities, pancreas was not seen.  She does wonder if stress is triggering some of this as she lost her husband 3 years ago, she talks about how sometimes she will notice this pain come on when she is working on [employer], and she gets slightly choked up talking about this.  Wt Readings from Last 3 Encounters:  04/26/18 114 lb (51.7 kg)  03/25/18 113 lb 6.4 oz (51.4 kg)  01/18/18 112 lb (50.8 kg)   Chronic  constipation is treated with Gavilyte daily, she stopped it for a while and then said she restarted it and has not been as effective though she is moving her bowels. Allergies  Allergen Reactions  . Cat Hair Extract Other (See Comments)    Patient states that it causes her eyes to swell shut  . Cefaclor Other (See Comments)    unknown  . Codeine Nausea Only  . Iodinated Diagnostic Agents Other (See Comments)    Unknown allergy Unknown allergy   . Other Nausea Only    Unknown allergy Pain medications, Pt states all kinds of pain medications she is unable to take except tylenol  . Oxycodone-Acetaminophen Nausea And Vomiting  . Penicillins Itching   Current Meds  Medication Sig  . Calcium 600-400 MG-UNIT CHEW Chew by mouth.  . clonazePAM (KLONOPIN) 0.5 MG tablet Take 1& 1/2 tablets at bedtime as needed for sleep.  Marland Kitchen GAVILYTE-N WITH FLAVOR PACK 420 g solution MIX WITH WATER AND DRINK 3-3 1/2 OUNCES DAILY AS DIRECTED.  . Multiple Vitamins-Minerals (PRESERVISION AREDS PO) Take 2 tablets by mouth.  Marland Kitchen omeprazole (PRILOSEC) 20 MG capsule Take 1 capsule (20 mg total) by mouth daily before breakfast.  . TEGRETOL 200 MG tablet Take 1 & 1/2 tablets daily.  . Vitamin D, Ergocalciferol, (DRISDOL) 50000 units CAPS capsule TAKE 1 CAPSULE WEEKLY.  . zoledronic acid (RECLAST) 5 MG/100ML SOLN injection Inject 5  mg into the vein once. Every other year per patient   Past Medical History:  Diagnosis Date  . Anxiety   . Cataract   . Chronic constipation   . Depression   . Osteoporosis   . Seizures ([name])    epilepsy  last seizure 1977   Past Surgical History:  Procedure Laterality Date  . ABDOMINAL HYSTERECTOMY  1995   Dr. Carlis Abbott  . CATARACT EXTRACTION Bilateral 12/2016  . COLONOSCOPY  2012   patchy increased intraepithelial lymphocytes - draelos  . ESOPHAGOGASTRODUODENOSCOPY     multiple  . FISSURECTOMY    . WRIST SURGERY Left    due to fracture   Social History   Social History  Narrative   Social History     Marital status: Widowed           Spouse name:                        Years of education:  1 year college              Number of children:0           Widowed no children      Occupational History: Editor, commissioning, Adm. Therapist, music care, [employer].)     None on file      Social History Main Topics     Smoking status: Former Smoker                                             Packs/day: 1.00      Years: 0.00            Types: Cigarettes        Smokeless tobacco: Not on file                        Alcohol use: No               Drug use: No               Sexual activity: Not on file            Does not drink caffeine, but does eat chocolate.   Does live in an apartment ([address]) retirement community does exercise : walks daily   Right handed         family history includes Alcohol abuse in her father; Colon cancer in her maternal grandfather; Diabetes in her father, maternal grandmother, and paternal grandmother.   Review of Systems As per HPI  Objective:   Physical Exam BP 106/64   Pulse 86   Ht 5\' 7"  (1.702 m)   Wt 114 lb (51.7 kg)   BMI 17.85 kg/m  Thin elderly white woman in no acute distress The abdomen is soft and nontender without organomegaly or mass bowel sounds are present there are no hernias  15 minutes time spent with patient > half in counseling coordination of care

## 2018-04-29 ENCOUNTER — Ambulatory Visit (HOSPITAL_COMMUNITY)
Admission: RE | Admit: 2018-04-29 | Discharge: 2018-04-29 | Disposition: A | Discharge status: Home / Self Care | Payer: Medicare Other | Source: Ambulatory Visit | Attending: Internal Medicine | Admitting: Internal Medicine

## 2018-04-29 ENCOUNTER — Other Ambulatory Visit: Payer: Self-pay

## 2018-04-29 DIAGNOSIS — M546 Pain in thoracic spine: Secondary | ICD-10-CM | POA: Diagnosis not present

## 2018-04-29 DIAGNOSIS — K7689 Other specified diseases of liver: Secondary | ICD-10-CM | POA: Diagnosis not present

## 2018-04-29 DIAGNOSIS — R1013 Epigastric pain: Secondary | ICD-10-CM | POA: Diagnosis not present

## 2018-04-29 MED ORDER — IOHEXOL 300 MG/ML  SOLN
100.0000 mL | Freq: Once | INTRAMUSCULAR | Status: AC | PRN
  Administered 2018-04-29: 100 mL via INTRAVENOUS

## 2018-04-29 MED ORDER — SODIUM CHLORIDE (PF) 0.9 % IJ SOLN
INTRAMUSCULAR | Status: AC
  Filled 2018-04-29: qty 50

## 2018-04-30 NOTE — Progress Notes (Signed)
Please tell her that the CT scan did not identify any abnormalities that should explain her symptoms.  There was a ? Nodule of ovarian tissue on right ovary region - did she have ovaries removed with hysterectomy?  Even if she did the plan would be for pelvic US in 3 months s olet's schedule that dx would be right ovary abnormal on CT  She should schedule a late April visit w/ me for f/u also

## 2018-05-04 ENCOUNTER — Ambulatory Visit: Payer: Medicare Other | Admitting: Internal Medicine

## 2018-05-25 ENCOUNTER — Other Ambulatory Visit: Payer: Self-pay | Admitting: *Deleted

## 2018-05-25 ENCOUNTER — Other Ambulatory Visit: Payer: Self-pay | Admitting: Nurse Practitioner

## 2018-05-25 DIAGNOSIS — G40909 Epilepsy, unspecified, not intractable, without status epilepticus: Secondary | ICD-10-CM

## 2018-05-25 DIAGNOSIS — G4700 Insomnia, unspecified: Secondary | ICD-10-CM

## 2018-05-25 MED ORDER — PEG 3350-KCL-NA BICARB-NACL 420 G PO SOLR: ORAL | 0 refills | Status: DC

## 2018-05-25 MED ORDER — CLONAZEPAM 0.5 MG PO TABS: ORAL_TABLET | ORAL | 0 refills | Status: DC

## 2018-06-02 ENCOUNTER — Other Ambulatory Visit: Payer: Self-pay | Admitting: Nurse Practitioner

## 2018-06-03 NOTE — Telephone Encounter (Signed)
Spoke with pharmacist ([place]) at [facility], she stated that the patient is receiving 30 day supply of the [place] now. Her Klonopin is 30 days and they receive the script.

## 2018-06-16 ENCOUNTER — Encounter: Payer: Self-pay | Admitting: General Surgery

## 2018-06-17 ENCOUNTER — Encounter: Payer: Self-pay | Admitting: Internal Medicine

## 2018-06-17 ENCOUNTER — Ambulatory Visit (INDEPENDENT_AMBULATORY_CARE_PROVIDER_SITE_OTHER): Payer: Medicare Other | Admitting: Internal Medicine

## 2018-06-17 ENCOUNTER — Other Ambulatory Visit: Payer: Self-pay

## 2018-06-17 ENCOUNTER — Ambulatory Visit: Payer: Medicare Other | Admitting: Internal Medicine

## 2018-06-17 DIAGNOSIS — G8929 Other chronic pain: Secondary | ICD-10-CM

## 2018-06-17 DIAGNOSIS — R1013 Epigastric pain: Secondary | ICD-10-CM

## 2018-06-17 DIAGNOSIS — R935 Abnormal findings on diagnostic imaging of other abdominal regions, including retroperitoneum: Secondary | ICD-10-CM

## 2018-06-17 DIAGNOSIS — K5904 Chronic idiopathic constipation: Secondary | ICD-10-CM | POA: Diagnosis not present

## 2018-06-17 NOTE — Assessment & Plan Note (Signed)
Question residual tissue in the area of the right ovary versus other.  Needs pelvic ultrasound in June.  We will arrange.

## 2018-06-17 NOTE — Patient Instructions (Signed)
It was good to speak to you today.  I am glad you are feeling better.  Try to avoid eating too much roughage and typical foods like that.  Broccoli cauliflower fibrous vegetables lettuce cabbage etc.  It sounds like this bothers you more.  We will arrange for you to have an ultrasound of the pelvis to look at this right pelvic region where there might be some residual ovarian tissue, this should be done in June.  Once I have those results we will let you know.  Otherwise contact us in between if you feel you need to.  I appreciate the opportunity to care for you. Iva Boop, MD, [facility]

## 2018-06-17 NOTE — Assessment & Plan Note (Signed)
Overall seems improved and tolerating life on the increased PPI.  She will watch her diet and avoid cruciferous vegetables or at least minimize roughage in the like.

## 2018-06-17 NOTE — Progress Notes (Signed)
TELEHEALTH ENCOUNTER IN SETTING OF COVID-19 PANDEMIC - REQUESTED BY PATIENT SERVICE PROVIDED BY TELEMEDECINE - TYPE: Telephone PATIENT LOCATION: Home PATIENT HAS CONSENTED TO TELEHEALTH VISIT PROVIDER LOCATION: OFFICE PARTICIPANTS OTHER THAN PATIENT: None TIME SPENT ON CALL: 15 minutes    Erin Ochoa 78 y.o. 08/27/1940 [id number]  Assessment & Plan:  Abdominal pain, chronic, epigastric Overall seems improved and tolerating life on the increased PPI.  She will watch her diet and avoid cruciferous vegetables or at least minimize roughage in the like.  Constipation Stable to improved  Abnormal CT scan, pelvis Question residual tissue in the area of the right ovary versus other.  Needs pelvic ultrasound in June.  We will arrange.  In general she continues to be somewhat symptomatic but reported that she feels better we have no good objective evidence for any serious health condition causing her problems.  She was reassured by this.  I appreciate the opportunity to care for this patient. CC: Mast, Man X, NP   Subjective:   Chief Complaint: Follow-up of abdominal pain  HPI The patient reports that overall she is better on the omeprazole at 40 as opposed to 20 mg daily.  She had some gastritis and benign polyps but no celiac disease on recent EGD.  Of April 29, 2018 CT of the abdomen and pelvis demonstrated no cause of her pain which is reassuring though there was a soft tissue attenuating nodule measuring 1.6 cm thought to possibly represent residual ovarian tissue (she is status post hysterectomy) versus something else and an ultrasound follow-up was recommended for 3 months. She is struggling some with the restrictions with the coronavirus.  She notes that when she eats foods high in roughage she has more problems with epigastric pain.  Continues with some insomnia.  Overall says she feels better though she has lost some weight.  She notes that her constipation has improved with  the increased dose of omeprazole and does not need the GABA light every day. Wt Readings from Last 3 Encounters:  06/17/18 109 lb (49.4 kg)  06/16/18 109 lb (49.4 kg)  04/26/18 114 lb (51.7 kg)    Allergies  Allergen Reactions  . Cat Hair Extract Other (See Comments)    Patient states that it causes her eyes to swell shut  . Cefaclor Other (See Comments)    unknown  . Codeine Nausea Only  . Iodinated Diagnostic Agents Other (See Comments)    Shrimp caused stomach pain and nausea Per patient can eat fried oysters okay    . Other Nausea Only    Unknown allergy Pain medications, Pt states all kinds of pain medications she is unable to take except tylenol  . Oxycodone-Acetaminophen Nausea And Vomiting  . Penicillins Itching   Current Meds  Medication Sig  . Calcium 600-400 MG-UNIT CHEW Chew by mouth.  . clonazePAM (KLONOPIN) 0.5 MG tablet TAKE 1&1/2 TABLETS AT BEDTIME AS NEEDED FOR SLEEP.  . Multiple Vitamins-Minerals (PRESERVISION AREDS PO) Take 2 tablets by mouth.  Marland Kitchen omeprazole (PRILOSEC) 40 MG capsule Take 1 capsule (40 mg total) by mouth daily.  . polyethylene glycol-electrolytes (GAVILYTE-N WITH FLAVOR PACK) 420 g solution MIX WITH WATER AND DRINK 3-3 1/2 OUNCES DAILY AS DIRECTED.  Marland Kitchen TEGRETOL 200 MG tablet Take 1 & 1/2 tablets daily.  . Vitamin D, Ergocalciferol, (DRISDOL) 1.25 MG (50000 UT) CAPS capsule TAKE 1 CAPSULE WEEKLY.  . zoledronic acid (RECLAST) 5 MG/100ML SOLN injection Inject 5 mg into the vein once. Every other year  per patient  . [DISCONTINUED] diphenhydrAMINE (BENADRYL) 50 MG tablet Take one tablet at 1:00pm on 04/29/2018 for contrast allergy pre-medication treatment  . [DISCONTINUED] predniSONE (DELTASONE) 50 MG tablet Take one tablet by mouth at 1:00AM on 04/29/2018, then one tablet by mouth at 7:00AM and one tablet by mouth at 1:00 PM   Pre-treatment for contrast allergy   Past Medical History:  Diagnosis Date  . Anxiety   . Cataract   . Chronic  constipation   . Depression   . Osteoporosis   . Seizures ([name])    epilepsy  last seizure 1977   Past Surgical History:  Procedure Laterality Date  . ABDOMINAL HYSTERECTOMY  1995   Dr. Carlis AbbottVan Fletcher  . CATARACT EXTRACTION Bilateral 12/2016  . COLONOSCOPY  2012   patchy increased intraepithelial lymphocytes - draelos  . ESOPHAGOGASTRODUODENOSCOPY     multiple  . FISSURECTOMY    . WRIST SURGERY Left    due to fracture   Social History   Social History Narrative   Social History     Marital status: Widowed           Spouse name:                        Years of education:  1 year college              Number of children:0           Widowed no children      Occupational History: Editor, commissioningffice Manager medical practices, Adm. Therapist, musicAssistant pastoral care, [employer]     None on file      Social History Main Topics     Smoking status: Former Smoker                                             Packs/day: 1.00      Years: 0.00            Types: Cigarettes        Smokeless tobacco: Not on file                        Alcohol use: No               Drug use: No               Sexual activity: Not on file            Does not drink caffeine, but does eat chocolate.   Does live in an apartment ([address]) retirement community does exercise : walks daily   Right handed         family history includes Alcohol abuse in her father; Colon cancer in her maternal grandfather; Diabetes in her father, maternal grandmother, and paternal grandmother.   Review of Systems See HPI

## 2018-06-17 NOTE — Assessment & Plan Note (Signed)
Stable to improved.  

## 2018-06-29 ENCOUNTER — Other Ambulatory Visit: Payer: Self-pay | Admitting: Nurse Practitioner

## 2018-06-29 ENCOUNTER — Telehealth: Payer: Self-pay

## 2018-06-29 DIAGNOSIS — G4700 Insomnia, unspecified: Secondary | ICD-10-CM

## 2018-06-29 DIAGNOSIS — G40909 Epilepsy, unspecified, not intractable, without status epilepticus: Secondary | ICD-10-CM

## 2018-06-29 DIAGNOSIS — R935 Abnormal findings on diagnostic imaging of other abdominal regions, including retroperitoneum: Secondary | ICD-10-CM

## 2018-06-29 NOTE — Telephone Encounter (Signed)
I got her U/S pelvis complete with transvaginal appointment set up for June 25th at 1:30pm at [facility]. She is to arrive with a full bladder. She can eat and drink beforehand. I left her a voice mail to call me back so I can give her this information.

## 2018-06-29 NOTE — Telephone Encounter (Signed)
Patient called back and has been informed of the date/time for her appointment. She wrote it down.

## 2018-06-29 NOTE — Telephone Encounter (Signed)
Last refill 05/25/2018 Don't have access to  Database Routed to Man X Mast for approval

## 2018-07-29 ENCOUNTER — Encounter: Payer: Self-pay | Admitting: Nurse Practitioner

## 2018-07-29 ENCOUNTER — Other Ambulatory Visit: Payer: Self-pay

## 2018-07-29 ENCOUNTER — Non-Acute Institutional Stay: Payer: Medicare Other | Admitting: Nurse Practitioner

## 2018-07-29 DIAGNOSIS — G40822 Epileptic spasms, not intractable, without status epilepticus: Secondary | ICD-10-CM

## 2018-07-29 DIAGNOSIS — K5904 Chronic idiopathic constipation: Secondary | ICD-10-CM | POA: Diagnosis not present

## 2018-07-29 DIAGNOSIS — R634 Abnormal weight loss: Secondary | ICD-10-CM

## 2018-07-29 DIAGNOSIS — K3189 Other diseases of stomach and duodenum: Secondary | ICD-10-CM | POA: Diagnosis not present

## 2018-07-29 NOTE — Patient Instructions (Addendum)
Diet and exercise to avoid further weight loss, monitor for s/s of depression. F/u in clinic [place] with Dr Lyndel Safe in 4 months. Update CBC/diff, CMP/eGFR, TSH

## 2018-07-29 NOTE — Assessment & Plan Note (Addendum)
Stable, continue Omeprazole 40mg  qd. 01/18/18 endoscopy: non bleeding erosive gastropathy, multiple gastric polyps.

## 2018-07-29 NOTE — Progress Notes (Signed)
Location:   clinic [place]   Place of Service:  Clinic (12) Provider: Marlana Latus NP  Code Status: DNR Goals of Care: [place] 07/29/2018  Does Patient Have a Medical Advance Directive? Yes  Type of Advance Directive Living will;Healthcare Power of Attorney  Does patient want to make changes to medical advance directive? No - Patient declined  Copy of [place] in Chart? Yes - validated most recent copy scanned in chart (See row information)     Chief Complaint  Patient presents with  . Medical Management of Chronic Issues    4 month follow up, discuss omeprazole, discuss reclast ,PHQ 0, LOW FALL RISK     HPI: Patient is a 78 y.o. female seen today for medical management of chronic diseases.    The patient has history of Seizures, no acute seizure activity since last seen, on Tegretol 337m qd, Clonazepam 0.595mdaily prn. Hx of constipation, stable on Gavilyte 420gm qd. GERD stable on Omeprazole 4084md.   Past Medical History:  Diagnosis Date  . Anxiety   . Cataract   . Chronic constipation   . Depression   . Osteoporosis   . [place]  [place]  last seizure 1977    Past Surgical History:  Procedure Laterality Date  . ABDOMINAL HYSTERECTOMY  1995   Dr. VanEdwyna Shell CATARACT EXTRACTION Bilateral 12/2016  . COLONOSCOPY  2012   patchy increased intraepithelial lymphocytes - draelos  . ESOPHAGOGASTRODUODENOSCOPY     multiple  . FISSURECTOMY    . WRIST SURGERY Left    due to fracture    Allergies  Allergen Reactions  . Cat Hair Extract Other (See Comments)    Patient states that it causes her eyes to swell shut  . Cefaclor Other (See Comments)    unknown  . Codeine Nausea Only  . Iodinated Diagnostic Agents Other (See Comments)    Shrimp caused stomach pain and nausea Per patient can eat fried oysters okay    . Other Nausea Only    Unknown allergy Pain medications, Pt states all kinds of pain medications she is  unable to take except tylenol  . Oxycodone-Acetaminophen Nausea And Vomiting  . Penicillins Itching    Allergies as of 07/29/2018      Reactions   Cat Hair Extract Other (See Comments)   Patient states that it causes her eyes to swell shut   Cefaclor Other (See Comments)   unknown   Codeine Nausea Only   Iodinated Diagnostic Agents Other (See Comments)   Shrimp caused stomach pain and nausea Per patient can eat fried oysters okay   Other Nausea Only   Unknown allergy Pain medications, Pt states all kinds of pain medications she is unable to take except tylenol   Oxycodone-acetaminophen Nausea And Vomiting   Penicillins Itching      Medication List       Accurate as of July 29, 2018  4:33 PM. If you have any questions, ask your nurse or doctor.        Calcium 600-400 MG-UNIT Chew Chew by mouth 2 (two) times a day.   clonazePAM 0.5 MG tablet Commonly known as: KLONOPIN TAKE 1&1/2 TABLETS AT BEDTIME AS NEEDED FOR SLEEP. What changed: See the new instructions.   omeprazole 40 MG capsule Commonly known as: PRILOSEC Take 1 capsule (40 mg total) by mouth daily.   polyethylene glycol-electrolytes 420 g solution Commonly known as: GaviLyte-N with Flavor Pack MIX  WITH WATER AND DRINK 3-3 1/2 OUNCES DAILY AS DIRECTED.   PRESERVISION AREDS PO Take 2 tablets by mouth.   Reclast 5 MG/100ML Soln injection Generic drug: zoledronic acid Inject 5 mg into the vein once. Every other year per patient   TEGretol 200 MG tablet Generic drug: carbamazepine Take 1 & 1/2 tablets daily.   Vitamin D (Ergocalciferol) 1.25 MG (50000 UT) Caps capsule Commonly known as: DRISDOL TAKE 1 CAPSULE WEEKLY.       Review of Systems:  Review of Systems  Constitutional: Negative for activity change, appetite change, chills, diaphoresis, fatigue, fever and unexpected weight change.  HENT: Positive for hearing loss. Negative for congestion and voice change.   Eyes: Negative for visual  disturbance.  Respiratory: Negative for cough, shortness of breath and wheezing.   Cardiovascular: Negative for chest pain, palpitations and leg swelling.  Gastrointestinal: Negative for abdominal distention, abdominal pain, constipation, diarrhea, nausea and vomiting.  Genitourinary: Negative for difficulty urinating, dysuria and urgency.  Musculoskeletal: Positive for gait problem. Negative for arthralgias and back pain.  Skin: Negative for color change and pallor.  Neurological: Negative for dizziness, tremors, seizures, speech difficulty and headaches.  Psychiatric/Behavioral: Positive for dysphoric mood and sleep disturbance. Negative for agitation. The patient is not nervous/anxious.        Taking Clonazerpam    Health Maintenance  Topic Date Due  . FOOT EXAM  07/06/1950  . OPHTHALMOLOGY EXAM  07/06/1950  . URINE MICROALBUMIN  07/06/1950  . MAMMOGRAM  08/13/2017  . HEMOGLOBIN A1C  11/25/2017  . INFLUENZA VACCINE  09/18/2018  . TETANUS/TDAP  08/21/2027  . DEXA SCAN  Completed  . PNA vac Low Risk Adult  Completed    Physical Exam: Vitals:   07/29/18 1405  BP: 109/60  Pulse: 84  Temp: 98.2 F (36.8 C)  TempSrc: Oral  SpO2: 97%  Weight: 109 lb 6.4 oz (49.6 kg)  Height: '5\' 7"'$  (1.702 m)   Body mass index is 17.13 kg/m. Physical Exam Vitals signs and nursing note reviewed.  Constitutional:      General: She is not in acute distress.    Appearance: Normal appearance. She is not ill-appearing, toxic-appearing or diaphoretic.  HENT:     Head: Normocephalic and atraumatic.     Nose: Nose normal.     Mouth/Throat:     Mouth: Mucous membranes are moist.  Eyes:     Extraocular Movements: Extraocular movements intact.     Conjunctiva/sclera: Conjunctivae normal.  Neck:     Musculoskeletal: Normal range of motion and neck supple.  Cardiovascular:     Rate and Rhythm: Normal rate and regular rhythm.     Heart sounds: No murmur.  Pulmonary:     Effort: Pulmonary effort  is normal.     Breath sounds: Normal breath sounds. No wheezing, rhonchi or rales.  Chest:     Chest wall: No tenderness.  Abdominal:     General: Bowel sounds are normal. There is no distension.     Palpations: Abdomen is soft.     Tenderness: There is no abdominal tenderness. There is no right CVA tenderness, left CVA tenderness, guarding or rebound.  Musculoskeletal:     Right lower leg: No edema.     Left lower leg: No edema.  Skin:    General: Skin is warm and dry.  Neurological:     General: No focal deficit present.     Mental Status: She is alert and oriented to person, place, and time. Mental  status is at baseline.     Cranial Nerves: No cranial nerve deficit.     Motor: No weakness.     Coordination: Coordination normal.     Gait: Gait abnormal.  Psychiatric:        Mood and Affect: Mood normal.        Behavior: Behavior normal.        Judgment: Judgment normal.     Labs reviewed: Basic Metabolic Panel: Recent Labs    11/04/17 0000 04/13/18 0730 04/26/18 1513  NA 141  --  141  K 3.8  --  3.9  CL 106  --  103  CO2 28  --  31  GLUCOSE 103*  --  138*  BUN 11  --  16  CREATININE 0.73 0.67 0.71  CALCIUM 9.3 9.2 9.6   Liver Function Tests: Recent Labs    11/04/17 0000 04/26/18 1513  AST 16 16  ALT 9 10  ALKPHOS  --  53  BILITOT 0.5 0.4  PROT 6.0* 6.9  ALBUMIN  --  4.6   Recent Labs    11/04/17 0000  LIPASE 15  AMYLASE 19*   No results for input(s): AMMONIA in the last 8760 hours. CBC: Recent Labs    11/04/17 0000  WBC 4.1  NEUTROABS 2,292  HGB 13.5  HCT 38.9  MCV 91.7  PLT 177   Lipid Panel: Recent Labs    11/04/17 0000  CHOL 219*  HDL 49*  LDLCALC 143*  TRIG 142  CHOLHDL 4.5   Lab Results  Component Value Date   HGBA1C 5.9 05/26/2017    Procedures since last visit: No results found.  Assessment/Plan  Erosive gastropathy Stable, continue Omeprazole 559m qd. 01/18/18 endoscopy: non bleeding erosive gastropathy, multiple  gastric polyps.   Epilepsy without status epilepticus, not intractable ([name]) Stable, continue Tegretol 3077mqd, Clonazepam 0.59m559maily prn.  Constipation  Stable, continue Gavilyte 420gm qd  Weight loss Complaining muscle wasting, skin gets thin, weight loss-but actually weights are stable in the past 3-4 months, she declined Mirtazapine or antidepressants. Will continue to monitor weight, continue diet and exercise. Will update labs   Labs/tests ordered: CBC/diff, CMP/eGFR, TSH   Next appt:  4 months with Dr. GupLyndel Safe

## 2018-07-29 NOTE — Assessment & Plan Note (Signed)
Stable, continue Tegretol 300mg  qd, Clonazepam 0.5mg  daily prn.

## 2018-07-29 NOTE — Assessment & Plan Note (Signed)
Complaining muscle wasting, skin gets thin, weight loss-but actually weights are stable in the past 3-4 months, she declined Mirtazapine or antidepressants. Will continue to monitor weight, continue diet and exercise. Will update labs

## 2018-07-29 NOTE — Assessment & Plan Note (Signed)
Stable, continue Gavilyte 420gm qd

## 2018-08-08 ENCOUNTER — Other Ambulatory Visit: Payer: Self-pay | Admitting: Nurse Practitioner

## 2018-08-08 DIAGNOSIS — G4700 Insomnia, unspecified: Secondary | ICD-10-CM

## 2018-08-08 DIAGNOSIS — G40909 Epilepsy, unspecified, not intractable, without status epilepticus: Secondary | ICD-10-CM

## 2018-08-09 ENCOUNTER — Telehealth: Payer: Self-pay | Admitting: Internal Medicine

## 2018-08-09 ENCOUNTER — Other Ambulatory Visit: Payer: Self-pay | Admitting: Nurse Practitioner

## 2018-08-09 DIAGNOSIS — G40909 Epilepsy, unspecified, not intractable, without status epilepticus: Secondary | ICD-10-CM

## 2018-08-09 DIAGNOSIS — G4700 Insomnia, unspecified: Secondary | ICD-10-CM

## 2018-08-09 NOTE — Telephone Encounter (Signed)
Questions answered and she is going to try her best to find a ride to her appointment. She is having some back pain, no UTI symptoms. She wonders if it's from her medicines. She has osteoporosis she said . She is going to try and walk more and less sitting that she said she has been doing with COVID.

## 2018-08-09 NOTE — Telephone Encounter (Signed)
Pt requested a call back to discuss cancellation policy for US pelvis at [facility].

## 2018-08-12 ENCOUNTER — Other Ambulatory Visit: Payer: Self-pay

## 2018-08-12 ENCOUNTER — Ambulatory Visit (HOSPITAL_COMMUNITY)
Admission: RE | Admit: 2018-08-12 | Discharge: 2018-08-12 | Disposition: A | Discharge status: Home / Self Care | Payer: Medicare Other | Source: Ambulatory Visit | Attending: Internal Medicine | Admitting: Internal Medicine

## 2018-08-12 DIAGNOSIS — N858 Other specified noninflammatory disorders of uterus: Secondary | ICD-10-CM | POA: Diagnosis not present

## 2018-08-12 DIAGNOSIS — Z90710 Acquired absence of both cervix and uterus: Secondary | ICD-10-CM | POA: Diagnosis not present

## 2018-08-12 DIAGNOSIS — R935 Abnormal findings on diagnostic imaging of other abdominal regions, including retroperitoneum: Secondary | ICD-10-CM | POA: Insufficient documentation

## 2018-08-13 NOTE — Progress Notes (Signed)
Please let her know that this does not show any residual ovarian tissue as suspected by the CT Ultrasound is more accurate than CT in this situation So this is good news  She may see me prn

## 2018-08-23 ENCOUNTER — Other Ambulatory Visit: Payer: Self-pay | Admitting: Nurse Practitioner

## 2018-08-23 DIAGNOSIS — G40909 Epilepsy, unspecified, not intractable, without status epilepticus: Secondary | ICD-10-CM

## 2018-08-23 NOTE — Telephone Encounter (Signed)
Ok to fill? Pt has not had vit d level checked, please advise

## 2018-09-09 ENCOUNTER — Other Ambulatory Visit: Payer: Self-pay | Admitting: Nurse Practitioner

## 2018-09-09 DIAGNOSIS — G40909 Epilepsy, unspecified, not intractable, without status epilepticus: Secondary | ICD-10-CM

## 2018-09-09 DIAGNOSIS — G4700 Insomnia, unspecified: Secondary | ICD-10-CM

## 2018-09-09 NOTE — Telephone Encounter (Signed)
Last refill 6/22 Routed to Dr Lyndel Safe  for refill approval

## 2018-09-18 DIAGNOSIS — Z23 Encounter for immunization: Secondary | ICD-10-CM | POA: Diagnosis not present

## 2018-09-20 ENCOUNTER — Other Ambulatory Visit: Payer: Self-pay | Admitting: Nurse Practitioner

## 2018-09-20 DIAGNOSIS — G40909 Epilepsy, unspecified, not intractable, without status epilepticus: Secondary | ICD-10-CM

## 2018-09-30 ENCOUNTER — Other Ambulatory Visit: Payer: Self-pay | Admitting: Nurse Practitioner

## 2018-10-05 DIAGNOSIS — H35013 Changes in retinal vascular appearance, bilateral: Secondary | ICD-10-CM | POA: Diagnosis not present

## 2018-10-05 DIAGNOSIS — H353132 Nonexudative age-related macular degeneration, bilateral, intermediate dry stage: Secondary | ICD-10-CM | POA: Diagnosis not present

## 2018-10-05 DIAGNOSIS — H43393 Other vitreous opacities, bilateral: Secondary | ICD-10-CM | POA: Diagnosis not present

## 2018-10-05 DIAGNOSIS — H40013 Open angle with borderline findings, low risk, bilateral: Secondary | ICD-10-CM | POA: Diagnosis not present

## 2018-10-14 ENCOUNTER — Other Ambulatory Visit: Payer: Self-pay | Admitting: Nurse Practitioner

## 2018-10-14 ENCOUNTER — Other Ambulatory Visit: Payer: Self-pay | Admitting: Internal Medicine

## 2018-10-14 DIAGNOSIS — G40909 Epilepsy, unspecified, not intractable, without status epilepticus: Secondary | ICD-10-CM

## 2018-10-14 DIAGNOSIS — G4700 Insomnia, unspecified: Secondary | ICD-10-CM

## 2018-10-18 ENCOUNTER — Other Ambulatory Visit: Payer: Self-pay | Admitting: Internal Medicine

## 2018-10-18 DIAGNOSIS — G40909 Epilepsy, unspecified, not intractable, without status epilepticus: Secondary | ICD-10-CM

## 2018-11-11 ENCOUNTER — Other Ambulatory Visit: Payer: Self-pay | Admitting: Nurse Practitioner

## 2018-11-19 ENCOUNTER — Other Ambulatory Visit: Payer: Self-pay | Admitting: Nurse Practitioner

## 2018-11-30 ENCOUNTER — Other Ambulatory Visit: Payer: Medicare Other

## 2018-12-14 ENCOUNTER — Other Ambulatory Visit: Payer: Self-pay

## 2018-12-14 ENCOUNTER — Other Ambulatory Visit: Payer: Self-pay | Admitting: Nurse Practitioner

## 2018-12-14 ENCOUNTER — Other Ambulatory Visit: Payer: Medicare Other

## 2018-12-14 DIAGNOSIS — R634 Abnormal weight loss: Secondary | ICD-10-CM

## 2018-12-14 DIAGNOSIS — K3189 Other diseases of stomach and duodenum: Secondary | ICD-10-CM | POA: Diagnosis not present

## 2018-12-15 LAB — COMPLETE METABOLIC PANEL WITH GFR
AG Ratio: 2.1 (calc) (ref 1.0–2.5)
ALT: 10 U/L (ref 6–29)
AST: 16 U/L (ref 10–35)
Albumin: 4.4 g/dL (ref 3.6–5.1)
Alkaline phosphatase (APISO): 47 U/L (ref 37–153)
BUN: 17 mg/dL (ref 7–25)
CO2: 27 mmol/L (ref 20–32)
Calcium: 9.4 mg/dL (ref 8.6–10.4)
Chloride: 106 mmol/L (ref 98–110)
Creat: 0.75 mg/dL (ref 0.60–0.93)
GFR, Est African American: 88 mL/min/{1.73_m2} (ref >=60)
GFR, Est Non African American: 76 mL/min/{1.73_m2} (ref >=60)
Globulin: 2.1 g/dL (calc) (ref 1.9–3.7)
Glucose, Bld: 114 mg/dL — ABNORMAL HIGH (ref 65–99)
Potassium: 4.1 mmol/L (ref 3.5–5.3)
Sodium: 144 mmol/L (ref 135–146)
Total Bilirubin: 0.5 mg/dL (ref 0.2–1.2)
Total Protein: 6.5 g/dL (ref 6.1–8.1)

## 2018-12-15 LAB — CBC WITH DIFFERENTIAL/PLATELET
Absolute Monocytes: 519 cells/uL (ref 200–950)
Basophils Absolute: 42 cells/uL (ref 0–200)
Basophils Relative: 0.8 %
Eosinophils Absolute: 42 cells/uL (ref 15–500)
Eosinophils Relative: 0.8 %
HCT: 40.8 % (ref 35.0–45.0)
Hemoglobin: 13.7 g/dL (ref 11.7–15.5)
Lymphs Abs: 1336 cells/uL (ref 850–3900)
MCH: 32.5 pg (ref 27.0–33.0)
MCHC: 33.6 g/dL (ref 32.0–36.0)
MCV: 96.7 fL (ref 80.0–100.0)
MPV: 10.7 fL (ref 7.5–12.5)
Monocytes Relative: 9.8 %
Neutro Abs: 3360 cells/uL (ref 1500–7800)
Neutrophils Relative %: 63.4 %
Platelets: 215 10*3/uL (ref 140–400)
RBC: 4.22 10*6/uL (ref 3.80–5.10)
RDW: 12.5 % (ref 11.0–15.0)
Total Lymphocyte: 25.2 %
WBC: 5.3 10*3/uL (ref 3.8–10.8)

## 2018-12-15 LAB — TSH: TSH: 2.33 mIU/L (ref 0.40–4.50)

## 2018-12-17 ENCOUNTER — Other Ambulatory Visit: Payer: Self-pay

## 2018-12-17 ENCOUNTER — Encounter: Payer: Self-pay | Admitting: Internal Medicine

## 2018-12-17 ENCOUNTER — Non-Acute Institutional Stay: Payer: Medicare Other | Admitting: Internal Medicine

## 2018-12-17 VITALS — BP 110/58 | HR 98 | Temp 98.7°F | Ht 67.0 in | Wt 115.0 lb

## 2018-12-17 DIAGNOSIS — K3189 Other diseases of stomach and duodenum: Secondary | ICD-10-CM | POA: Diagnosis not present

## 2018-12-17 DIAGNOSIS — M810 Age-related osteoporosis without current pathological fracture: Secondary | ICD-10-CM

## 2018-12-17 DIAGNOSIS — G40822 Epileptic spasms, not intractable, without status epilepticus: Secondary | ICD-10-CM | POA: Diagnosis not present

## 2018-12-17 DIAGNOSIS — K5904 Chronic idiopathic constipation: Secondary | ICD-10-CM | POA: Diagnosis not present

## 2018-12-17 DIAGNOSIS — G4700 Insomnia, unspecified: Secondary | ICD-10-CM

## 2018-12-17 DIAGNOSIS — E785 Hyperlipidemia, unspecified: Secondary | ICD-10-CM | POA: Diagnosis not present

## 2018-12-17 NOTE — Progress Notes (Signed)
Location:  [facility] [employer] [facility] teacher of Service:  Clinic (12)  Provider:   Code Status:  Goals of Care:  Advanced Directives 07/29/2018  Does Patient Have a Medical Advance Directive? Yes  Type of Advance Directive Living will;Healthcare Power of Attorney  Does patient want to make changes to medical advance directive? No - Patient declined  Copy of Healthcare Power of Attorney in Chart? Yes - validated most recent copy scanned in chart (See row information)     Chief Complaint  Patient presents with  . Medical Management of Chronic Issues    4 month follow up. Patient is having a hard time with foods due to acid issues. She complains of some back pain for the last 8 months or so.    HPI: Patient is a 78 y.o. female seen today for medical management of chronic diseases.    Patient has h/o Seizures Has been stable on Tegretol Has seen Dr Lucia Gaskins in 2019 Osteoporosis T score of -3 in Dexa from 2017 Has received 3 doses of Reclast off and on. No tsure if she wants to take Prolia. Has H/o Erosive Gastritis s/p EGD in 2019 Not taking Prilosec anymore due to worry of Side effects She tries to avoid certain foods to help her Symptoms Insomnia Takes very Low dose of Klonopin and says it helps. Doe snot want to change the dose or taper Low Back Pain Has noticed sometimes with bending Macular Degeneration Following with her Opthalmologist Depression Has some signs as she lost her husband few years ago Has no Family here. Her Pastors are her POA   Past Medical History:  Diagnosis Date  . Anxiety   . Cataract   . Chronic constipation   . Depression   . Osteoporosis   . Seizures ([name])    epilepsy  last seizure 1977    Past Surgical History:  Procedure Laterality Date  . ABDOMINAL HYSTERECTOMY  1995   Dr. Carlis Abbott  . CATARACT EXTRACTION Bilateral 12/2016  . COLONOSCOPY  2012   patchy increased intraepithelial lymphocytes - draelos  .  ESOPHAGOGASTRODUODENOSCOPY     multiple  . FISSURECTOMY    . WRIST SURGERY Left    due to fracture    Allergies  Allergen Reactions  . Cat Hair Extract Other (See Comments)    Patient states that it causes her eyes to swell shut  . Cefaclor Other (See Comments)    unknown  . Codeine Nausea Only  . Iodinated Diagnostic Agents Other (See Comments)    Shrimp caused stomach pain and nausea Per patient can eat fried oysters okay    . Other Nausea Only    Unknown allergy Pain medications, Pt states all kinds of pain medications she is unable to take except tylenol  . Oxycodone-Acetaminophen Nausea And Vomiting  . Penicillins Itching    Outpatient Encounter Medications as of 12/17/2018  Medication Sig  . Calcium 600-400 MG-UNIT CHEW Chew by mouth 2 (two) times a day.   . clonazePAM (KLONOPIN) 0.5 MG tablet TAKE 1&1/2 TABLETS AT BEDTIME AS NEEDED FOR SLEEP. (Patient taking differently: TAKE 1&1/4 TABLETS AT BEDTIME AS NEEDED FOR SLEEP.)  . Multiple Vitamins-Minerals (PRESERVISION AREDS PO) Take 2 tablets by mouth.  . polyethylene glycol-electrolytes (GAVILYTE-N WITH FLAVOR PACK) 420 g solution MIX WITH WATER AND DRINK 3-3 1/2 OUNCES DAILY AS DIRECTED.  Marland Kitchen TEGRETOL 200 MG tablet TAKE 1 & 1/2 TABLETS ONCE DAILY.  Marland Kitchen Vitamin D, Ergocalciferol, (DRISDOL) 1.25 MG (50000 UT)  CAPS capsule TAKE 1 CAPSULE WEEKLY.  . [DISCONTINUED] omeprazole (PRILOSEC) 40 MG capsule Take 1 capsule (40 mg total) by mouth daily.  . [DISCONTINUED] zoledronic acid (RECLAST) 5 MG/100ML SOLN injection Inject 5 mg into the vein once. Every other year per patient   No facility-administered encounter medications on file as of 12/17/2018.     Review of Systems:  Review of Systems  Constitutional: Negative.   HENT: Negative.   Respiratory: Negative.   Cardiovascular: Negative.   Gastrointestinal: Negative.   Genitourinary: Negative.   Musculoskeletal: Positive for back pain.  Neurological: Negative.    Psychiatric/Behavioral: Positive for sleep disturbance.    Health Maintenance  Topic Date Due  . FOOT EXAM  07/06/1950  . OPHTHALMOLOGY EXAM  07/06/1950  . URINE MICROALBUMIN  07/06/1950  . MAMMOGRAM  08/13/2017  . HEMOGLOBIN A1C  11/25/2017  . TETANUS/TDAP  08/21/2027  . INFLUENZA VACCINE  Completed  . DEXA SCAN  Completed  . PNA vac Low Risk Adult  Completed    Physical Exam: Vitals:   12/17/18 1132  BP: (!) 110/58  Pulse: 98  Temp: 98.7 F (37.1 C)  SpO2: 97%  Weight: 115 lb (52.2 kg)  Height: 5\' 7"  (1.702 m)   Body mass index is 18.01 kg/m. Physical Exam Vitals signs reviewed.  Constitutional:      Appearance: Normal appearance.  HENT:     Head: Normocephalic.     Nose: Nose normal.     Mouth/Throat:     Mouth: Mucous membranes are moist.     Pharynx: Oropharynx is clear.  Eyes:     Pupils: Pupils are equal, round, and reactive to light.  Neck:     Musculoskeletal: Neck supple.  Cardiovascular:     Rate and Rhythm: Normal rate and regular rhythm.     Pulses: Normal pulses.  Pulmonary:     Effort: Pulmonary effort is normal.     Breath sounds: Normal breath sounds.  Abdominal:     General: Abdomen is flat. Bowel sounds are normal.     Palpations: Abdomen is soft.  Musculoskeletal:        General: No swelling.     Comments: Back exam did not have any Point tenderness  Skin:    General: Skin is warm.  Neurological:     General: No focal deficit present.     Mental Status: She is alert and oriented to person, place, and time.     Comments: Gait was stable  Psychiatric:        Mood and Affect: Mood normal.        Thought Content: Thought content normal.     Labs reviewed: Basic Metabolic Panel: Recent Labs    04/13/18 0730 04/26/18 1513 12/14/18 1040  NA  --  141 144  K  --  3.9 4.1  CL  --  103 106  CO2  --  31 27  GLUCOSE  --  138* 114*  BUN  --  16 17  CREATININE 0.67 0.71 0.75  CALCIUM 9.2 9.6 9.4  TSH  --   --  2.33   Liver  Function Tests: Recent Labs    04/26/18 1513 12/14/18 1040  AST 16 16  ALT 10 10  ALKPHOS 53  --   BILITOT 0.4 0.5  PROT 6.9 6.5  ALBUMIN 4.6  --    No results for input(s): LIPASE, AMYLASE in the last 8760 hours. No results for input(s): AMMONIA in the last 8760 hours. CBC:  Recent Labs    12/14/18 1040  WBC 5.3  NEUTROABS 3,360  HGB 13.7  HCT 40.8  MCV 96.7  PLT 215   Lipid Panel: No results for input(s): CHOL, HDL, LDLCALC, TRIG, CHOLHDL, LDLDIRECT in the last 8760 hours. Lab Results  Component Value Date   HGBA1C 5.9 05/26/2017    Procedures since last visit: No results found.  Assessment/Plan Erosive gastropathy Not taking Prilosec anymore Tries to avoid Different Foods to help her symptoms Talked about trying OTC Pepcid with PRN omeprazole  Hyperlipidemia,  LDL in 1/19 was 143 Will need repeat Fasting Lipid  Nonintractable epileptic spasms without status epilepticus  Stable on Tegretol Follows with Neurology as needed Chronic idiopathic constipation Takes Gavilyte  Osteoporosis,  Has T score of -3 Has been on Reclast before Wants to restart Our Office cannot schedule that in hospital Will make Referral to Rheumatology If patient change to Prolia we can do it in the clinic  Insomnia, unspecified type Continue Low dose of Klonopin She is not interested in GDR  Low back Pain Exam was normal She will try Tylenol pRN Imaging if pain persists   Labs/tests ordered:  * No order type specified * Next appt:  Visit date not found  Total time spent in this patient care encounter was  45_  minutes; greater than 50% of the visit spent counseling patient and staff, reviewing records , Labs and coordinating care for problems addressed at this encounter.

## 2018-12-27 ENCOUNTER — Other Ambulatory Visit: Payer: Self-pay | Admitting: Nurse Practitioner

## 2018-12-28 NOTE — Telephone Encounter (Signed)
Please advise if it is ok to approve medication with enough refills for 1 year

## 2019-02-01 ENCOUNTER — Other Ambulatory Visit: Payer: Self-pay | Admitting: Nurse Practitioner

## 2019-02-01 DIAGNOSIS — G40909 Epilepsy, unspecified, not intractable, without status epilepticus: Secondary | ICD-10-CM

## 2019-02-01 DIAGNOSIS — G4700 Insomnia, unspecified: Secondary | ICD-10-CM

## 2019-02-02 NOTE — Telephone Encounter (Signed)
Patient needs non-opioid contract completed. Last filled 10/14/18 #45 x2

## 2019-02-21 DIAGNOSIS — Z23 Encounter for immunization: Secondary | ICD-10-CM | POA: Diagnosis not present

## 2019-03-08 ENCOUNTER — Other Ambulatory Visit: Payer: Self-pay

## 2019-03-08 ENCOUNTER — Other Ambulatory Visit: Payer: Self-pay | Admitting: Internal Medicine

## 2019-03-08 DIAGNOSIS — G40822 Epileptic spasms, not intractable, without status epilepticus: Secondary | ICD-10-CM

## 2019-03-08 DIAGNOSIS — E785 Hyperlipidemia, unspecified: Secondary | ICD-10-CM | POA: Diagnosis not present

## 2019-03-09 LAB — COMPLETE METABOLIC PANEL WITH GFR
AG Ratio: 2.3 (calc) (ref 1.0–2.5)
ALT: 9 U/L (ref 6–29)
AST: 16 U/L (ref 10–35)
Albumin: 4.3 g/dL (ref 3.6–5.1)
Alkaline phosphatase (APISO): 44 U/L (ref 37–153)
BUN: 17 mg/dL (ref 7–25)
CO2: 29 mmol/L (ref 20–32)
Calcium: 9.4 mg/dL (ref 8.6–10.4)
Chloride: 105 mmol/L (ref 98–110)
Creat: 0.79 mg/dL (ref 0.60–0.93)
GFR, Est African American: 83 mL/min/{1.73_m2} (ref >=60)
GFR, Est Non African American: 72 mL/min/{1.73_m2} (ref >=60)
Globulin: 1.9 g/dL (calc) (ref 1.9–3.7)
Glucose, Bld: 102 mg/dL — ABNORMAL HIGH (ref 65–99)
Potassium: 3.9 mmol/L (ref 3.5–5.3)
Sodium: 142 mmol/L (ref 135–146)
Total Bilirubin: 0.5 mg/dL (ref 0.2–1.2)
Total Protein: 6.2 g/dL (ref 6.1–8.1)

## 2019-03-09 LAB — CBC WITH DIFFERENTIAL/PLATELET
Absolute Monocytes: 518 cells/uL (ref 200–950)
Basophils Absolute: 38 cells/uL (ref 0–200)
Basophils Relative: 0.8 %
Eosinophils Absolute: 82 cells/uL (ref 15–500)
Eosinophils Relative: 1.7 %
HCT: 42.3 % (ref 35.0–45.0)
Hemoglobin: 14.3 g/dL (ref 11.7–15.5)
Lymphs Abs: 1555 cells/uL (ref 850–3900)
MCH: 32.6 pg (ref 27.0–33.0)
MCHC: 33.8 g/dL (ref 32.0–36.0)
MCV: 96.4 fL (ref 80.0–100.0)
MPV: 11 fL (ref 7.5–12.5)
Monocytes Relative: 10.8 %
Neutro Abs: 2606 cells/uL (ref 1500–7800)
Neutrophils Relative %: 54.3 %
Platelets: 204 10*3/uL (ref 140–400)
RBC: 4.39 10*6/uL (ref 3.80–5.10)
RDW: 12.5 % (ref 11.0–15.0)
Total Lymphocyte: 32.4 %
WBC: 4.8 10*3/uL (ref 3.8–10.8)

## 2019-03-09 LAB — LIPID PANEL
Cholesterol: 228 mg/dL — ABNORMAL HIGH (ref <200)
HDL: 58 mg/dL (ref >=50)
LDL Cholesterol (Calc): 145 mg/dL (calc) — ABNORMAL HIGH
Non-HDL Cholesterol (Calc): 170 mg/dL (calc) — ABNORMAL HIGH (ref <130)
Total CHOL/HDL Ratio: 3.9 (calc) (ref <5.0)
Triglycerides: 129 mg/dL (ref <150)

## 2019-03-10 ENCOUNTER — Encounter: Payer: Self-pay | Admitting: Nurse Practitioner

## 2019-03-10 DIAGNOSIS — E785 Hyperlipidemia, unspecified: Secondary | ICD-10-CM | POA: Insufficient documentation

## 2019-03-10 LAB — CBC WITH DIFFERENTIAL/PLATELET

## 2019-03-10 LAB — LIPID PANEL

## 2019-03-10 LAB — COMPLETE METABOLIC PANEL WITH GFR

## 2019-03-17 ENCOUNTER — Encounter: Payer: Self-pay | Admitting: Nurse Practitioner

## 2019-03-17 ENCOUNTER — Non-Acute Institutional Stay: Payer: Medicare Other | Admitting: Nurse Practitioner

## 2019-03-17 ENCOUNTER — Other Ambulatory Visit: Payer: Self-pay | Admitting: Nurse Practitioner

## 2019-03-17 ENCOUNTER — Other Ambulatory Visit: Payer: Self-pay

## 2019-03-17 DIAGNOSIS — G4700 Insomnia, unspecified: Secondary | ICD-10-CM

## 2019-03-17 DIAGNOSIS — R634 Abnormal weight loss: Secondary | ICD-10-CM | POA: Diagnosis not present

## 2019-03-17 DIAGNOSIS — M810 Age-related osteoporosis without current pathological fracture: Secondary | ICD-10-CM | POA: Diagnosis not present

## 2019-03-17 DIAGNOSIS — E785 Hyperlipidemia, unspecified: Secondary | ICD-10-CM | POA: Diagnosis not present

## 2019-03-17 DIAGNOSIS — G40822 Epileptic spasms, not intractable, without status epilepticus: Secondary | ICD-10-CM

## 2019-03-17 DIAGNOSIS — G40909 Epilepsy, unspecified, not intractable, without status epilepticus: Secondary | ICD-10-CM

## 2019-03-17 DIAGNOSIS — R7303 Prediabetes: Secondary | ICD-10-CM

## 2019-03-17 DIAGNOSIS — F419 Anxiety disorder, unspecified: Secondary | ICD-10-CM | POA: Diagnosis not present

## 2019-03-17 NOTE — Assessment & Plan Note (Addendum)
Hgb a1c 5.9 05/26/17, update Hgb a1c. F/u ophthalmology, will delay podiatrist  foot exam due to COVID pandemic, update urine micro albumin

## 2019-03-17 NOTE — Assessment & Plan Note (Addendum)
LDL 144, continue diet exercise. Referral to dietitian.

## 2019-03-17 NOTE — Progress Notes (Signed)
Location:   clinic [place]   Place of Service:  Clinic (12) Provider: Marlana Latus NP  Code Status: DNR Goals of Care: [place] 07/29/2018  Does Patient Have a Medical Advance Directive? Yes  Type of Advance Directive Living will;Healthcare Power of Attorney  Does patient want to make changes to medical advance directive? No - Patient declined  Copy of [place] in Chart? Yes - validated most recent copy scanned in chart (See row information)     Chief Complaint  Patient presents with  . Medical Management of Chronic Issues  . Health Maintenance    Foot and eye exam, urine microalbumin, mammogram, hemoglobin A1C    HPI: Patient is a 79 y.o. female seen today for medical management of chronic diseases.    The patient resides in [facility], pre diabetes,  diet controlled, due for urine microalbumin, needs foot and eye exam. Her seizure is stable on tegretol. Constipation, stable on Gavilyte.    Past Medical History:  Diagnosis Date  . Anxiety   . Cataract   . Chronic constipation   . Depression   . Osteoporosis   . Seizures ([place])    epilepsy  last seizure 1977    Past Surgical History:  Procedure Laterality Date  . ABDOMINAL HYSTERECTOMY  1995   Dr. Edwyna Shell  . CATARACT EXTRACTION Bilateral 12/2016  . COLONOSCOPY  2012   patchy increased intraepithelial lymphocytes - draelos  . ESOPHAGOGASTRODUODENOSCOPY     multiple  . FISSURECTOMY    . WRIST SURGERY Left    due to fracture    Allergies  Allergen Reactions  . Cat Hair Extract Other (See Comments)    Patient states that it causes her eyes to swell shut  . Cefaclor Other (See Comments)    unknown  . Codeine Nausea Only  . Iodinated Diagnostic Agents Other (See Comments)    Shrimp caused stomach pain and nausea Per patient can eat fried oysters okay    . Other Nausea Only    Unknown allergy Pain medications, Pt states all kinds of pain medications she is unable to take except  tylenol  . Oxycodone-Acetaminophen Nausea And Vomiting  . Penicillins Itching    Allergies as of 03/17/2019      Reactions   Cat Hair Extract Other (See Comments)   Patient states that it causes her eyes to swell shut   Cefaclor Other (See Comments)   unknown   Codeine Nausea Only   Iodinated Diagnostic Agents Other (See Comments)   Shrimp caused stomach pain and nausea Per patient can eat fried oysters okay   Other Nausea Only   Unknown allergy Pain medications, Pt states all kinds of pain medications she is unable to take except tylenol   Oxycodone-acetaminophen Nausea And Vomiting   Penicillins Itching      Medication List       Accurate as of March 17, 2019 11:59 PM. If you have any questions, ask your nurse or doctor.        Calcium 600-400 MG-UNIT Chew Chew by mouth 2 (two) times a day.   clonazePAM 0.5 MG tablet Commonly known as: KLONOPIN TAKE 1&1/2 TABLETS AT BEDTIME AS NEEDED FOR SLEEP.   polyethylene glycol-electrolytes 420 g solution Commonly known as: GaviLyte-N with Flavor Pack MIX WITH WATER AND DRINK 3-3 1/2 OUNCES DAILY AS DIRECTED.   PRESERVISION AREDS PO Take 2 tablets by mouth.   TEGretol 200 MG tablet Generic drug: carbamazepine TAKE 1 &  1/2 TABLETS ONCE DAILY.   Vitamin D (Ergocalciferol) 1.25 MG (50000 UNIT) Caps capsule Commonly known as: DRISDOL TAKE 1 CAPSULE WEEKLY.       Review of Systems:  Review of Systems  Constitutional: Negative for activity change, appetite change, chills, diaphoresis, fatigue and fever.  HENT: Positive for hearing loss. Negative for congestion and voice change.   Eyes: Negative for visual disturbance.  Respiratory: Negative for cough and shortness of breath.   Cardiovascular: Negative for chest pain, palpitations and leg swelling.  Gastrointestinal: Negative for abdominal distention, abdominal pain, constipation, diarrhea, nausea and vomiting.  Genitourinary: Negative for difficulty urinating, dysuria  and urgency.  Musculoskeletal: Positive for arthralgias and gait problem.  Skin: Negative for color change and pallor.  Neurological: Negative for dizziness, seizures, speech difficulty, weakness and headaches.  Psychiatric/Behavioral: Positive for dysphoric mood and sleep disturbance. Negative for agitation, behavioral problems and hallucinations. The patient is nervous/anxious.     Health Maintenance  Topic Date Due  . FOOT EXAM  07/06/1950  . OPHTHALMOLOGY EXAM  07/06/1950  . URINE MICROALBUMIN  07/06/1950  . MAMMOGRAM  08/13/2017  . HEMOGLOBIN A1C  11/25/2017  . TETANUS/TDAP  08/21/2027  . INFLUENZA VACCINE  Completed  . DEXA SCAN  Completed  . PNA vac Low Risk Adult  Completed    Physical Exam: Vitals:   03/17/19 1648  BP: 124/64  Pulse: 83  Temp: 98.2 F (36.8 C)  SpO2: 98%  Weight: 109 lb 3.2 oz (49.5 kg)  Height: 5\' 7"  (1.702 m)   Body mass index is 17.1 kg/m. Physical Exam Vitals and nursing note reviewed.  Constitutional:      General: She is not in acute distress.    Appearance: Normal appearance. She is not ill-appearing, toxic-appearing or diaphoretic.  HENT:     Head: Normocephalic and atraumatic.     Nose: Nose normal.     Mouth/Throat:     Mouth: Mucous membranes are moist.  Eyes:     Extraocular Movements: Extraocular movements intact.     Conjunctiva/sclera: Conjunctivae normal.     Pupils: Pupils are equal, round, and reactive to light.  Cardiovascular:     Rate and Rhythm: Normal rate and regular rhythm.     Heart sounds: No murmur.  Pulmonary:     Breath sounds: No wheezing, rhonchi or rales.  Abdominal:     General: Bowel sounds are normal. There is no distension.     Palpations: Abdomen is soft.     Tenderness: There is no abdominal tenderness. There is no right CVA tenderness, left CVA tenderness, guarding or rebound.  Musculoskeletal:     Cervical back: Normal range of motion and neck supple.     Right lower leg: No edema.     Left  lower leg: No edema.  Skin:    General: Skin is warm and dry.  Neurological:     General: No focal deficit present.     Mental Status: She is alert and oriented to person, place, and time. Mental status is at baseline.     Motor: No weakness.     Gait: Gait normal.  Psychiatric:        Behavior: Behavior normal.        Thought Content: Thought content normal.     Comments: Appears anxious      Labs reviewed: Basic Metabolic Panel: Recent Labs    04/26/18 1513 04/26/18 1513 12/14/18 1040 03/07/19 0000 03/08/19 0725  NA 141  --  144  --  142  K 3.9  --  4.1  --  3.9  CL 103  --  106  --  105  CO2 31  --  27  --  29  GLUCOSE 138*   < > 114* CANCELED 102*  BUN 16  --  17  --  17  CREATININE 0.71  --  0.75  --  0.79  CALCIUM 9.6  --  9.4  --  9.4  TSH  --   --  2.33  --   --    < > = values in this interval not displayed.   Liver Function Tests: Recent Labs    04/26/18 1513 12/14/18 1040 03/08/19 0725  AST 16 16 16   ALT 10 10 9   ALKPHOS 53  --   --   BILITOT 0.4 0.5 0.5  PROT 6.9 6.5 6.2  ALBUMIN 4.6  --   --    No results for input(s): LIPASE, AMYLASE in the last 8760 hours. No results for input(s): AMMONIA in the last 8760 hours. CBC: Recent Labs    12/14/18 1040 03/07/19 0000 03/08/19 0725  WBC 5.3 CANCELED 4.8  NEUTROABS 3,360  --  2,606  HGB 13.7  --  14.3  HCT 40.8  --  42.3  MCV 96.7  --  96.4  PLT 215  --  204   Lipid Panel: Recent Labs    03/07/19 0000 03/08/19 0725  CHOL  --  228*  HDL  --  58  LDLCALC CANCELED 145*  TRIG  --  129  CHOLHDL  --  3.9   Lab Results  Component Value Date   HGBA1C 5.9 05/26/2017    Procedures since last visit: No results found.  Assessment/Plan  Epilepsy without status epilepticus, not intractable ([name]) Stable, continue Tegretol.   Osteoporosis Vit D, Ca. The patient desires delaying DEXA duet to COVID pandemic.   Pre-diabetes Hgb a1c 5.9 05/26/17, update Hgb a1c. F/u ophthalmology, will delay  podiatrist  foot exam due to COVID pandemic, update urine micro albumin   Hyperlipidemia LDL 144, continue diet exercise. Referral to dietitian.   Anxiety R vs B of the Clonazepam use was discussed and questions answered.  Insomnia GDR of Clonazepam discussed. The patient stated her Clonazepam was prescribed for seizure treatment initially.    Labs/tests ordered:  Urine Micro albumin, Hgb a1c, lipid panel.   Next appt:  3 months

## 2019-03-17 NOTE — Patient Instructions (Signed)
Obtain urine micro albumin, Hgb a1c, lipid panel prior to the next appointment in 3 months.

## 2019-03-17 NOTE — Assessment & Plan Note (Addendum)
Vit D, Ca. The patient desires delaying DEXA duet to COVID pandemic.

## 2019-03-17 NOTE — Assessment & Plan Note (Signed)
Stable, continue Tegretol.

## 2019-03-18 ENCOUNTER — Encounter: Payer: Self-pay | Admitting: Nurse Practitioner

## 2019-03-18 NOTE — Assessment & Plan Note (Addendum)
R vs B of the Clonazepam use was discussed and questions answered.

## 2019-03-18 NOTE — Assessment & Plan Note (Addendum)
GDR of Clonazepam discussed. The patient stated her Clonazepam was prescribed for seizure treatment initially.

## 2019-03-21 DIAGNOSIS — Z23 Encounter for immunization: Secondary | ICD-10-CM | POA: Diagnosis not present

## 2019-04-18 ENCOUNTER — Other Ambulatory Visit: Payer: Self-pay | Admitting: Nurse Practitioner

## 2019-04-18 DIAGNOSIS — G40909 Epilepsy, unspecified, not intractable, without status epilepticus: Secondary | ICD-10-CM

## 2019-04-18 DIAGNOSIS — G4700 Insomnia, unspecified: Secondary | ICD-10-CM

## 2019-04-18 NOTE — Telephone Encounter (Signed)
Patient medication refill was sent through [facility]. Medication pend and sent to provider.

## 2019-04-20 ENCOUNTER — Telehealth: Payer: Self-pay

## 2019-04-20 NOTE — Telephone Encounter (Signed)
I spoke with [place] from [facility]. A faxed paper came over from them stating that they don't have medication Nulytely and was wondering if they could give patient Golytely instead with your permission. Please Advise.

## 2019-04-20 NOTE — Telephone Encounter (Signed)
Pharmacy called and notified. Thank you.

## 2019-05-03 DIAGNOSIS — G609 Hereditary and idiopathic neuropathy, unspecified: Secondary | ICD-10-CM | POA: Diagnosis not present

## 2019-05-19 ENCOUNTER — Other Ambulatory Visit: Payer: Self-pay | Admitting: Nurse Practitioner

## 2019-05-19 DIAGNOSIS — G40909 Epilepsy, unspecified, not intractable, without status epilepticus: Secondary | ICD-10-CM

## 2019-05-26 ENCOUNTER — Other Ambulatory Visit: Payer: Self-pay

## 2019-05-26 ENCOUNTER — Encounter: Payer: Self-pay | Admitting: Nurse Practitioner

## 2019-05-26 ENCOUNTER — Non-Acute Institutional Stay: Payer: Medicare Other | Admitting: Nurse Practitioner

## 2019-05-26 DIAGNOSIS — E785 Hyperlipidemia, unspecified: Secondary | ICD-10-CM

## 2019-05-26 DIAGNOSIS — M810 Age-related osteoporosis without current pathological fracture: Secondary | ICD-10-CM

## 2019-05-26 DIAGNOSIS — K5904 Chronic idiopathic constipation: Secondary | ICD-10-CM | POA: Diagnosis not present

## 2019-05-26 DIAGNOSIS — G40822 Epileptic spasms, not intractable, without status epilepticus: Secondary | ICD-10-CM

## 2019-05-26 NOTE — Patient Instructions (Signed)
Will schedule DEXA, may consider Alendronate if needed.

## 2019-05-26 NOTE — Progress Notes (Addendum)
Location:   clinic [place]   Place of Service:  Clinic (12) Provider: Marlana Latus NP  Code Status: DNR [place] of Care[place] 07/[place]/2018  Does Patient Have a Medical Advance Directive? Yes  Type of Advance Directive Living will;Healthcare Power of Attorney  Does patient want to make changes to medical advance directive? No - Patient declined  Copy of [place] in Chart? Yes - validated most recent copy scanned in chart (See row information)     Chief Complaint  Patient presents with  . Acute Visit    Patient here today to discuss pain in bones. Patient recently seen dietician. She is eating more. She states her bones burns and aches.     HPI: Patient is a 79 y.o. female seen today for an acute visit for cholesterol, on diet, exercise, LDL 145 03/08/19.  pain in bones ?Marland Kitchen Hx of seizures, on Tegretol 240m qd, Clonazepam. Constipation, stable, on Gavilyte  Past Medical History:  Diagnosis Date  . Anxiety   . Cataract   . Chronic constipation   . Depression   . Osteoporosis   . [place]ures [place]    epilepsy  last seizure 1977    Past Surgical History:  Procedure Laterality Date  . ABDOMINAL HYSTERECTOMY  1995   Dr. VEdwyna Shell . CATARACT EXTRACTION Bilateral 12/2016  . COLONOSCOPY  2012   patchy increased intraepithelial lymphocytes - draelos  . ESOPHAGOGASTRODUODENOSCOPY     multiple  . FISSURECTOMY    . WRIST SURGERY Left    due to fracture    Allergies  Allergen Reactions  . Cat Hair Extract Other (See Comments)    Patient states that it causes her eyes to swell shut  . Cefaclor Other (See Comments)    unknown  . Codeine Nausea Only  . Iodinated Diagnostic Agents Other (See Comments)    Shrimp caused stomach pain and nausea Per patient can eat fried oysters okay    . Other Nausea Only    Unknown allergy Pain medications, Pt states all kinds of pain medications she is unable to take except tylenol  . Oxycodone-Acetaminophen  Nausea And Vomiting  . Penicillins Itching    Allergies as of 05/26/2019      Reactions   Cat Hair Extract Other (See Comments)   Patient states that it causes her eyes to swell shut   Cefaclor Other (See Comments)   unknown   Codeine Nausea Only   Iodinated Diagnostic Agents Other (See Comments)   Shrimp caused stomach pain and nausea Per patient can eat fried oysters okay   Other Nausea Only   Unknown allergy Pain medications, Pt states all kinds of pain medications she is unable to take except tylenol   Oxycodone-acetaminophen Nausea And Vomiting   Penicillins Itching      Medication List       Accurate as of May 26, 2019  3:44 PM. If you have any questions, ask your nurse or doctor.        Calcium 600-400 MG-UNIT Chew Chew by mouth 2 (two) times a day.   clonazePAM 0.5 MG tablet Commonly known as: KLONOPIN TAKE 1&1/2 TABLETS AT BEDTIME AS NEEDED FOR SLEEP.   polyethylene glycol-electrolytes 420 g solution Commonly known as: GaviLyte-N with Flavor Pack MIX WITH WATER AND DRINK 3-3 1/2 OUNCES DAILY AS DIRECTED.   PRESERVISION AREDS PO Take 2 tablets by mouth.   TEGretol 200 MG tablet Generic drug: carbamazepine TAKE 1 & 1/2 TABLETS  ONCE DAILY.   Vitamin D (Ergocalciferol) 1.25 MG (50000 UNIT) Caps capsule Commonly known as: DRISDOL TAKE 1 CAPSULE WEEKLY.       Review of Systems:  Review of Systems  Constitutional: Negative for activity change, appetite change, fatigue and fever.  HENT: Positive for hearing loss. Negative for congestion and voice change.   Eyes: Negative for visual disturbance.  Respiratory: Negative for cough and shortness of breath.   Cardiovascular: Negative for chest pain, palpitations and leg swelling.  Gastrointestinal: Negative for abdominal distention, abdominal pain, constipation, diarrhea, nausea and vomiting.  Genitourinary: Negative for difficulty urinating, dysuria and urgency.  Musculoskeletal: Positive for arthralgias and  gait problem.       Mid back pain  Skin: Negative for color change and pallor.  Neurological: Negative for seizures, speech difficulty, weakness, light-headedness and headaches.  Psychiatric/Behavioral: Positive for dysphoric mood and sleep disturbance. Negative for agitation, behavioral problems and hallucinations. The patient is nervous/anxious.     Health Maintenance  Topic Date Due  . FOOT EXAM  Never done  . OPHTHALMOLOGY EXAM  Never done  . URINE MICROALBUMIN  Never done  . MAMMOGRAM  08/13/2017  . HEMOGLOBIN A1C  11/25/2017  . INFLUENZA VACCINE  09/18/2019  . TETANUS/TDAP  08/21/2027  . DEXA SCAN  Completed  . PNA vac Low Risk Adult  Completed    Physical Exam: Vitals:   05/26/19 1455  BP: 122/64  Pulse: 74  Temp: (!) 97.5 F (36.4 C)  SpO2: 98%  Weight: 104 lb 3.2 oz (47.3 kg)  Height: 5' 7"  (1.702 m)   Body mass index is 16.32 kg/m. Physical Exam Vitals and nursing note reviewed.  Constitutional:      General: She is not in acute distress.    Appearance: Normal appearance. She is toxic-appearing. She is not ill-appearing.  HENT:     Head: Normocephalic and atraumatic.     Nose: Nose normal.     Mouth/Throat:     Mouth: Mucous membranes are moist.  Eyes:     Extraocular Movements: Extraocular movements intact.     Conjunctiva/sclera: Conjunctivae normal.     Pupils: Pupils are equal, round, and reactive to light.  Cardiovascular:     Rate and Rhythm: Normal rate and regular rhythm.     Heart sounds: No murmur.  Pulmonary:     Breath sounds: No wheezing, rhonchi or rales.  Abdominal:     General: Bowel sounds are normal. There is no distension.     Palpations: Abdomen is soft.     Tenderness: There is no abdominal tenderness.  Musculoskeletal:     Cervical back: Normal range of motion and neck supple.     Right lower leg: No edema.     Left lower leg: No edema.  Skin:    General: Skin is warm and dry.  Neurological:     General: No focal deficit  present.     Mental Status: She is alert and oriented to person, place, and time. Mental status is at baseline.     Motor: No weakness.     Gait: Gait normal.  Psychiatric:        Mood and Affect: Mood normal.        Behavior: Behavior normal.        Thought Content: Thought content normal.     Comments: Appears anxious, obsessed with her weight     Labs reviewed: Basic Metabolic Panel: Recent Labs    12/14/18 1040 03/07/19 0000 03/08/19  0725  NA 144  --  142  K 4.1  --  3.9  CL 106  --  105  CO2 27  --  29  GLUCOSE 114* CANCELED 102*  BUN 17  --  17  CREATININE 0.75  --  0.79  CALCIUM 9.4  --  9.4  TSH 2.33  --   --    Liver Function Tests: Recent Labs    12/14/18 1040 03/08/19 0725  AST 16 16  ALT 10 9  BILITOT 0.5 0.5  PROT 6.5 6.2   No results for input(s): LIPASE, AMYLASE in the last 8760 hours. No results for input(s): AMMONIA in the last 8760 hours. CBC: Recent Labs    12/14/18 1040 03/07/19 0000 03/08/19 0725  WBC 5.3 CANCELED 4.8  NEUTROABS 3,360  --  2,606  HGB 13.7  --  14.3  HCT 40.8  --  42.3  MCV 96.7  --  96.4  PLT 215  --  204   Lipid Panel: Recent Labs    03/07/19 0000 03/08/19 0725  CHOL  --  228*  HDL  --  58  LDLCALC CANCELED 145*  TRIG  --  129  CHOLHDL  --  3.9   Lab Results  Component Value Date   HGBA1C 5.9 05/26/2017    Procedures since last visit: No results found.  Assessment/Plan Epilepsy without status epilepticus, not intractable ([name]) Stable, continue Tegretol, Clonazepam  Constipation Stable, continue Gavilyte  Hyperlipidemia LDL 145 03/08/19, continue diet, exercise life style modification.   Osteoporosis 2009 Forteo x 2 years, last Reclast  2019, total 3/4-5x, on Ca, Vit D Due DEXA, may consider Alendronate-Hx of GERD.     Labs/tests ordered:  CBC/diff, CMP/eGFR, lipid panel, TSH  Next appt:  06/09/2019

## 2019-05-26 NOTE — Assessment & Plan Note (Signed)
Stable, continue Tegretol, Clonazepam

## 2019-05-26 NOTE — Assessment & Plan Note (Addendum)
2009 Forteo x 2 years, last Reclast  2019, total 3/4-5x, on Ca, Vit D Due DEXA, may consider Alendronate-Hx of GERD.

## 2019-05-26 NOTE — Assessment & Plan Note (Signed)
LDL 145 03/08/19, continue diet, exercise life style modification.

## 2019-05-26 NOTE — Assessment & Plan Note (Signed)
Stable, continue [employer]

## 2019-05-26 NOTE — Addendum Note (Signed)
Addended by: Maggie Schwalbe X on: 05/26/2019 03:44 PM   Modules accepted: Orders

## 2019-05-27 ENCOUNTER — Telehealth: Payer: Self-pay | Admitting: *Deleted

## 2019-05-27 ENCOUNTER — Other Ambulatory Visit: Payer: Self-pay | Admitting: Nurse Practitioner

## 2019-05-27 DIAGNOSIS — M810 Age-related osteoporosis without current pathological fracture: Secondary | ICD-10-CM

## 2019-05-27 NOTE — Telephone Encounter (Signed)
Pt calling stating that she called [facility] to schedule her bone density and [facility] does not do bone density, the order needs to be put in to [facility]. Pt is wanting this done ASAP.

## 2019-05-30 ENCOUNTER — Other Ambulatory Visit: Payer: Self-pay | Admitting: Nurse Practitioner

## 2019-05-30 DIAGNOSIS — G40909 Epilepsy, unspecified, not intractable, without status epilepticus: Secondary | ICD-10-CM

## 2019-05-30 DIAGNOSIS — G4700 Insomnia, unspecified: Secondary | ICD-10-CM

## 2019-06-08 DIAGNOSIS — M810 Age-related osteoporosis without current pathological fracture: Secondary | ICD-10-CM | POA: Diagnosis not present

## 2019-06-08 DIAGNOSIS — K5904 Chronic idiopathic constipation: Secondary | ICD-10-CM | POA: Diagnosis not present

## 2019-06-09 ENCOUNTER — Other Ambulatory Visit: Payer: Self-pay

## 2019-06-09 DIAGNOSIS — M810 Age-related osteoporosis without current pathological fracture: Secondary | ICD-10-CM

## 2019-06-09 DIAGNOSIS — K5904 Chronic idiopathic constipation: Secondary | ICD-10-CM

## 2019-06-12 LAB — CBC WITH DIFFERENTIAL/PLATELET
Absolute Monocytes: 564 cells/uL (ref 200–950)
Basophils Absolute: 42 cells/uL (ref 0–200)
Basophils Relative: 0.9 %
Eosinophils Absolute: 61 cells/uL (ref 15–500)
Eosinophils Relative: 1.3 %
HCT: 43.5 % (ref 35.0–45.0)
Hemoglobin: 14.5 g/dL (ref 11.7–15.5)
Lymphs Abs: 1391 cells/uL (ref 850–3900)
MCH: 32.6 pg (ref 27.0–33.0)
MCHC: 33.3 g/dL (ref 32.0–36.0)
MCV: 97.8 fL (ref 80.0–100.0)
MPV: 11.7 fL (ref 7.5–12.5)
Monocytes Relative: 12 %
Neutro Abs: 2641 cells/uL (ref 1500–7800)
Neutrophils Relative %: 56.2 %
Platelets: 184 10*3/uL (ref 140–400)
RBC: 4.45 10*6/uL (ref 3.80–5.10)
RDW: 12.2 % (ref 11.0–15.0)
Total Lymphocyte: 29.6 %
WBC: 4.7 10*3/uL (ref 3.8–10.8)

## 2019-06-12 LAB — COMPLETE METABOLIC PANEL WITH GFR
AG Ratio: 2 (calc) (ref 1.0–2.5)
ALT: 25 U/L (ref 6–29)
AST: 26 U/L (ref 10–35)
Albumin: 4.3 g/dL (ref 3.6–5.1)
Alkaline phosphatase (APISO): 50 U/L (ref 37–153)
BUN: 19 mg/dL (ref 7–25)
CO2: 27 mmol/L (ref 20–32)
Calcium: 9.7 mg/dL (ref 8.6–10.4)
Chloride: 105 mmol/L (ref 98–110)
Creat: 0.75 mg/dL (ref 0.60–0.93)
GFR, Est African American: 88 mL/min/{1.73_m2} (ref >=60)
GFR, Est Non African American: 76 mL/min/{1.73_m2} (ref >=60)
Globulin: 2.1 g/dL (calc) (ref 1.9–3.7)
Glucose, Bld: 100 mg/dL — ABNORMAL HIGH (ref 65–99)
Potassium: 3.9 mmol/L (ref 3.5–5.3)
Sodium: 144 mmol/L (ref 135–146)
Total Bilirubin: 0.6 mg/dL (ref 0.2–1.2)
Total Protein: 6.4 g/dL (ref 6.1–8.1)

## 2019-06-12 LAB — TSH: TSH: 3.09 mIU/L (ref 0.40–4.50)

## 2019-06-12 LAB — VITAMIN D 1,25 DIHYDROXY
Vitamin D 1, 25 (OH)2 Total: 46 pg/mL (ref 18–72)
Vitamin D2 1, 25 (OH)2: 29 pg/mL
Vitamin D3 1, 25 (OH)2: 17 pg/mL

## 2019-06-16 ENCOUNTER — Other Ambulatory Visit: Payer: Self-pay | Admitting: Nurse Practitioner

## 2019-06-16 ENCOUNTER — Non-Acute Institutional Stay: Payer: Medicare Other | Admitting: Nurse Practitioner

## 2019-06-16 ENCOUNTER — Other Ambulatory Visit: Payer: Self-pay

## 2019-06-16 ENCOUNTER — Encounter: Payer: Self-pay | Admitting: Nurse Practitioner

## 2019-06-16 VITALS — BP 118/90 | HR 78 | Temp 97.8°F | Ht 67.0 in | Wt 103.6 lb

## 2019-06-16 DIAGNOSIS — G40822 Epileptic spasms, not intractable, without status epilepticus: Secondary | ICD-10-CM

## 2019-06-16 DIAGNOSIS — K5904 Chronic idiopathic constipation: Secondary | ICD-10-CM

## 2019-06-16 DIAGNOSIS — E785 Hyperlipidemia, unspecified: Secondary | ICD-10-CM

## 2019-06-16 DIAGNOSIS — G40909 Epilepsy, unspecified, not intractable, without status epilepticus: Secondary | ICD-10-CM

## 2019-06-16 DIAGNOSIS — R7303 Prediabetes: Secondary | ICD-10-CM

## 2019-06-16 MED ORDER — ATORVASTATIN CALCIUM 10 MG PO TABS: 5.0000 mg | ORAL_TABLET | Freq: Every day | ORAL | 3 refills | Status: DC

## 2019-06-16 NOTE — Assessment & Plan Note (Addendum)
Stable, continue Tegretol, prn Clonazepam. 06/09/19 Vit D 46, TSH 3.09, Na 144, K 3.9, Bun 19, creat 0.75, eGFR 76, wbc 4.7, Hgb 14.5, plt 184, neutrophils 56.2%

## 2019-06-16 NOTE — Assessment & Plan Note (Signed)
Stable, continue Gavilyte prn, Golytely.

## 2019-06-16 NOTE — Progress Notes (Signed)
Location:   clinic [place]   Place of Service:  Clinic (12) Provider: Marlana Latus NP  Code Status: DNR Goals of Care: [place] 07/29/2018  Does Patient Have a Medical Advance Directive? Yes  Type of Advance Directive Living will;Healthcare Power of Attorney  Does patient want to make changes to medical advance directive? No - Patient declined  Copy of [place] in Chart? Yes - validated most recent copy scanned in chart (See row information)     Chief Complaint  Patient presents with  . Medical Management of Chronic Issues    3 month follow up  . Health Maintenance    Foot and eye exam, mammogram    HPI: Patient is a 79 y.o. female seen today for medical management of chronic diseases.    The patient has Hx of seizures, no active seizures since last seen, on Tegretol 229m qd, Clonazepam 0.574mprn hs. Constipation, stable, on Golytely prn, Gavilyte prn.    Past Medical History:  Diagnosis Date  . Anxiety   . Cataract   . Chronic constipation   . Depression   . Osteoporosis   . [place]   [place]  last seizure 1977    Past Surgical History:  Procedure Laterality Date  . ABDOMINAL HYSTERECTOMY  1995   Dr. VaEdwyna Shell. CATARACT EXTRACTION Bilateral 12/2016  . COLONOSCOPY  2012   patchy increased intraepithelial lymphocytes - draelos  . ESOPHAGOGASTRODUODENOSCOPY     multiple  . FISSURECTOMY    . WRIST SURGERY Left    due to fracture    Allergies  Allergen Reactions  . Cat Hair Extract Other (See Comments)    Patient states that it causes her eyes to swell shut  . Cefaclor Other (See Comments)    unknown  . Codeine Nausea Only  . Iodinated Diagnostic Agents Other (See Comments)    Shrimp caused stomach pain and nausea Per patient can eat fried oysters okay    . Other Nausea Only    Unknown allergy Pain medications, Pt states all kinds of pain medications she is unable to take except tylenol  .  Oxycodone-Acetaminophen Nausea And Vomiting  . Penicillins Itching    Allergies as of 06/16/2019      Reactions   Cat Hair Extract Other (See Comments)   Patient states that it causes her eyes to swell shut   Cefaclor Other (See Comments)   unknown   Codeine Nausea Only   Iodinated Diagnostic Agents Other (See Comments)   Shrimp caused stomach pain and nausea Per patient can eat fried oysters okay   Other Nausea Only   Unknown allergy Pain medications, Pt states all kinds of pain medications she is unable to take except tylenol   Oxycodone-acetaminophen Nausea And Vomiting   Penicillins Itching      Medication List       Accurate as of June 16, 2019 11:59 PM. If you have any questions, ask your nurse or doctor.        STOP taking these medications   polyethylene glycol-electrolytes 420 g solution Commonly known as: GaviLyte-N with Flavor Pack Stopped by: Kamilla Hands X Lisa Blakeman, NP     TAKE these medications   atorvastatin 10 MG tablet Commonly known as: LIPITOR Take 0.5 tablets (5 mg total) by mouth daily. Started by: Lizzette Carbonell X Tyeisha Dinan, NP   Calcium 600-400 MG-UNIT Chew Chew by mouth 2 (two) times a day.   clonazePAM 0.5 MG tablet Commonly  known as: KLONOPIN TAKE 1&1/2 TABLETS AT BEDTIME AS NEEDED FOR SLEEP.   Golytely 236 g solution Generic drug: polyethylene glycol MIX WITH WATER AND DRINK 3-3 1/2 OUNCES DAILY AS DIRECTED.   PRESERVISION AREDS PO Take 2 tablets by mouth.   TEGretol 200 MG tablet Generic drug: carbamazepine TAKE 1 & 1/2 TABLETS ONCE DAILY.   Vitamin D (Ergocalciferol) 1.25 MG (50000 UNIT) Caps capsule Commonly known as: DRISDOL TAKE 1 CAPSULE WEEKLY.       Review of Systems:  Review of Systems  Constitutional: Negative for activity change, appetite change, fatigue and fever.  HENT: Positive for hearing loss. Negative for congestion and voice change.   Eyes: Negative for visual disturbance.  Respiratory: Negative for cough and shortness of breath.    Cardiovascular: Negative for leg swelling.  Gastrointestinal: Negative for abdominal distention, abdominal pain and constipation.  Genitourinary: Negative for difficulty urinating, dysuria and urgency.  Musculoskeletal: Positive for arthralgias and gait problem.       Mid back pain  Skin: Negative for color change.  Neurological: Negative for dizziness, seizures, speech difficulty and weakness.  Psychiatric/Behavioral: Positive for dysphoric mood and sleep disturbance. Negative for agitation and behavioral problems. The patient is not nervous/anxious.     Health Maintenance  Topic Date Due  . FOOT EXAM  Never done  . OPHTHALMOLOGY EXAM  Never done  . URINE MICROALBUMIN  Never done  . MAMMOGRAM  08/13/2017  . HEMOGLOBIN A1C  11/25/2017  . INFLUENZA VACCINE  09/18/2019  . TETANUS/TDAP  08/21/2027  . DEXA SCAN  Completed  . COVID-19 Vaccine  Completed  . PNA vac Low Risk Adult  Completed    Physical Exam: Vitals:   06/16/19 1508  BP: 118/90  Pulse: 78  Temp: 97.8 F (36.6 C)  SpO2: 98%  Weight: 103 lb 9.6 oz (47 kg)  Height: _0  (1.702 m)   Body mass index is 16.23 kg/m. Physical Exam Vitals and nursing note reviewed.  Constitutional:      Appearance: Normal appearance.  HENT:     Head: Normocephalic and atraumatic.     Mouth/Throat:     Mouth: Mucous membranes are moist.  Eyes:     Extraocular Movements: Extraocular movements intact.     Conjunctiva/sclera: Conjunctivae normal.     Pupils: Pupils are equal, round, and reactive to light.  Cardiovascular:     Rate and Rhythm: Normal rate and regular rhythm.     Heart sounds: No murmur.  Pulmonary:     Breath sounds: No wheezing or rales.  Abdominal:     General: Bowel sounds are normal. There is no distension.     Palpations: Abdomen is soft.     Tenderness: There is no abdominal tenderness.  Musculoskeletal:     Cervical back: Normal range of motion and neck supple.     Right lower leg: No edema.     Left  lower leg: No edema.  Skin:    General: Skin is warm and dry.  Neurological:     General: No focal deficit present.     Mental Status: She is alert and oriented to person, place, and time. Mental status is at baseline.     Motor: No weakness.     Gait: Gait normal.  Psychiatric:        Mood and Affect: Mood normal.        Behavior: Behavior normal.        Thought Content: Thought content normal.  Comments: Appears anxious, obsessed with her weight     Labs reviewed: Basic Metabolic Panel: Recent Labs    12/14/18 1040 12/14/18 1040 03/07/19 0000 03/08/19 0725 06/08/19 0928  NA 144  --   --  142 144  K 4.1  --   --  3.9 3.9  CL 106  --   --  105 105  CO2 27  --   --  29 27  GLUCOSE 114*   < > CANCELED 102* 100*  BUN 17  --   --  17 19  CREATININE 0.75  --   --  0.79 0.75  CALCIUM 9.4  --   --  9.4 9.7  TSH 2.33  --   --   --  3.09   < > = values in this interval not displayed.   Liver Function Tests: Recent Labs    12/14/18 1040 03/08/19 0725 06/08/19 0928  AST _0 ALT _1 BILITOT 0.5 0.5 0.6  PROT 6.5 6.2 6.4   No results for input(s): LIPASE, AMYLASE in the last 8760 hours. No results for input(s): AMMONIA in the last 8760 hours. CBC: Recent Labs    12/14/18 1040 12/14/18 1040 03/07/19 0000 03/08/19 0725 06/08/19 0928  WBC 5.3   < > CANCELED 4.8 4.7  NEUTROABS 3,360  --   --  2,606 2,641  HGB 13.7  --   --  14.3 14.5  HCT 40.8  --   --  42.3 43.5  MCV 96.7  --   --  96.4 97.8  PLT 215  --   --  204 184   < > = values in this interval not displayed.   Lipid Panel: Recent Labs    03/07/19 0000 03/08/19 0725  CHOL  --  228*  HDL  --  58  LDLCALC CANCELED 145*  TRIG  --  129  CHOLHDL  --  3.9   Lab Results  Component Value Date   HGBA1C 5.9 05/26/2017    Procedures since last visit: No results found.  Assessment/Plan  Epilepsy without status epilepticus, not intractable ([name]) Stable, continue Tegretol, prn Clonazepam.  06/09/19 Vit D 46, TSH 3.09, Na 144, K 3.9, Bun 19, creat 0.75, eGFR 76, wbc 4.7, Hgb 14.5, plt 184, neutrophils 56.2%   Constipation Stable, continue Gavilyte prn, Golytely.   Hyperlipidemia Will start Atorvastatin 30m po qd, repeat lipid panel in 3 months. LDL 145 03/08/19  Pre-diabetes 03/17/19 addressed f/u Ophthalmology, [place] 4/29/2 Hgb a1c, urine microalbumin order sent.    Labs/tests ordered:  Hgb a1c, urine albumin, Lipid panel prior to the next appointment.   Next appt:  Dr. GLyndel Safe3 months.

## 2019-06-16 NOTE — Assessment & Plan Note (Signed)
Will start Atorvastatin 5mg  po qd, repeat lipid panel in 3 months. LDL 145 03/08/19

## 2019-06-16 NOTE — Patient Instructions (Addendum)
Atorvastatin 5mg  by month daily started today, will repeat lipid panel in 3 months prior to Dr. f/u appointment. Also placed order for urine micro albumin and Hgb a1c prior to the appointment.

## 2019-06-17 ENCOUNTER — Encounter: Payer: Self-pay | Admitting: Nurse Practitioner

## 2019-06-17 NOTE — Assessment & Plan Note (Addendum)
03/17/19 addressed f/u Ophthalmology, delay Podiatrist foot exam due to COVID pandemic. 4/29/2 Hgb a1c, urine microalbumin order sent.

## 2019-06-27 ENCOUNTER — Other Ambulatory Visit: Payer: Self-pay | Admitting: Nurse Practitioner

## 2019-06-27 DIAGNOSIS — G40909 Epilepsy, unspecified, not intractable, without status epilepticus: Secondary | ICD-10-CM

## 2019-06-27 DIAGNOSIS — G4700 Insomnia, unspecified: Secondary | ICD-10-CM

## 2019-06-27 NOTE — Telephone Encounter (Signed)
Received ERX From pharmacy. Pended Rx and sent to [facility] for approval.

## 2019-07-15 ENCOUNTER — Other Ambulatory Visit: Payer: Self-pay | Admitting: Nurse Practitioner

## 2019-07-15 DIAGNOSIS — G40909 Epilepsy, unspecified, not intractable, without status epilepticus: Secondary | ICD-10-CM

## 2019-08-05 ENCOUNTER — Other Ambulatory Visit: Payer: Self-pay | Admitting: Nurse Practitioner

## 2019-08-05 DIAGNOSIS — G4700 Insomnia, unspecified: Secondary | ICD-10-CM

## 2019-08-05 DIAGNOSIS — G40909 Epilepsy, unspecified, not intractable, without status epilepticus: Secondary | ICD-10-CM

## 2019-08-05 NOTE — Telephone Encounter (Signed)
Received request from pharmacy.  Pended and sent to [facility] for approval.

## 2019-09-06 ENCOUNTER — Other Ambulatory Visit: Payer: Medicare Other

## 2019-09-08 ENCOUNTER — Other Ambulatory Visit: Payer: Self-pay | Admitting: Nurse Practitioner

## 2019-09-08 DIAGNOSIS — G4700 Insomnia, unspecified: Secondary | ICD-10-CM

## 2019-09-08 DIAGNOSIS — G40909 Epilepsy, unspecified, not intractable, without status epilepticus: Secondary | ICD-10-CM

## 2019-09-08 NOTE — Telephone Encounter (Signed)
Last filled in epic on 08/05/2019  No non-opioid treatment agreement on file, patient will need to sign at pending appointment on 10/21/2019 (notation made in appointment notes)

## 2019-09-13 ENCOUNTER — Other Ambulatory Visit: Payer: Self-pay | Admitting: Nurse Practitioner

## 2019-09-13 ENCOUNTER — Other Ambulatory Visit: Payer: Self-pay

## 2019-09-13 ENCOUNTER — Ambulatory Visit
Admission: RE | Admit: 2019-09-13 | Discharge: 2019-09-13 | Disposition: A | Discharge status: Home / Self Care | Payer: Medicare Other | Source: Ambulatory Visit | Attending: Nurse Practitioner | Admitting: Nurse Practitioner

## 2019-09-13 DIAGNOSIS — M810 Age-related osteoporosis without current pathological fracture: Secondary | ICD-10-CM | POA: Diagnosis not present

## 2019-09-13 DIAGNOSIS — Z780 Asymptomatic menopausal state: Secondary | ICD-10-CM | POA: Diagnosis not present

## 2019-09-13 NOTE — Telephone Encounter (Signed)
I am not sure if this medication is meant for long term use, I will send to Mast, Man X, NP to review and approve if necessary

## 2019-09-22 ENCOUNTER — Ambulatory Visit: Payer: Medicare Other | Admitting: Nurse Practitioner

## 2019-09-22 ENCOUNTER — Other Ambulatory Visit: Payer: Self-pay

## 2019-09-22 ENCOUNTER — Encounter: Payer: Self-pay | Admitting: Nurse Practitioner

## 2019-09-22 VITALS — BP 112/58 | HR 71 | Temp 97.8°F | Ht 67.0 in | Wt 104.6 lb

## 2019-09-22 DIAGNOSIS — K5904 Chronic idiopathic constipation: Secondary | ICD-10-CM | POA: Diagnosis not present

## 2019-09-22 DIAGNOSIS — M810 Age-related osteoporosis without current pathological fracture: Secondary | ICD-10-CM

## 2019-09-22 DIAGNOSIS — G40822 Epileptic spasms, not intractable, without status epilepticus: Secondary | ICD-10-CM

## 2019-09-22 DIAGNOSIS — E785 Hyperlipidemia, unspecified: Secondary | ICD-10-CM

## 2019-09-22 DIAGNOSIS — R7303 Prediabetes: Secondary | ICD-10-CM | POA: Diagnosis not present

## 2019-09-22 NOTE — Assessment & Plan Note (Signed)
no active seizures since last seen, continue Tegretol 200mg  qd, Clonazepam 0.5mg  prn hs.

## 2019-09-22 NOTE — Assessment & Plan Note (Signed)
stable, continue  Golytely prn, Gavilyte prn.

## 2019-09-22 NOTE — Assessment & Plan Note (Signed)
Pending Hgb a1c, Urine albumin, lipid panel.

## 2019-09-22 NOTE — Assessment & Plan Note (Addendum)
08/13/69 DEXA t score -3.0 09/14/19 DEXA t score -3.6 09/22/19 continue Ca, Vit D, Reclast x3 last inj 2017, due 2019, Needs referral.

## 2019-09-22 NOTE — Assessment & Plan Note (Signed)
Diet, exercise, update lipid panel.

## 2019-09-22 NOTE — Progress Notes (Signed)
Location:   clinic [place]   Place of Service:    Provider: Marlana Latus NP  Code Status: DNR Goals of Care:  Advanced Directives 07/29/2018  Does Patient Have a Medical Advance Directive? Yes  Type of Advance Directive Living will;Healthcare Power of Attorney  Does patient want to make changes to medical advance directive? No - Patient declined  Copy of [place] in Chart? Yes - validated most recent copy scanned in chart (See row information)     Chief Complaint  Patient presents with  . Medical Management of Chronic Issues    Patient returns to the clinic to review bone density results.     HPI: Patient is a 79 y.o. female seen today for medical management of chronic diseases.    Seizures, no active seizures since last seen, on Tegretol 226m qd, Clonazepam 0.563mprn hs.  Constipation, stable, on Golytely prn, Gavilyte prn.     Past Medical History:  Diagnosis Date  . Anxiety   . Cataract   . Chronic constipation   . Depression   . Osteoporosis   . [place]   [place]ile[place]  last seizure 1977    Past Surgical History:  Procedure Laterality Date  . ABDOMINAL HYSTERECTOMY  1995   Dr. VaEdwyna Shell. CATARACT EXTRACTION Bilateral 12/2016  . COLONOSCOPY  2012   patchy increased intraepithelial lymphocytes - draelos  . ESOPHAGOGASTRODUODENOSCOPY     multiple  . FISSURECTOMY    . WRIST SURGERY Left    due to fracture    Allergies  Allergen Reactions  . Cat Hair Extract Other (See Comments)    Patient states that it causes her eyes to swell shut  . Cefaclor Other (See Comments)    unknown  . Codeine Nausea Only  . Iodinated Diagnostic Agents Other (See Comments)    Shrimp caused stomach pain and nausea Per patient can eat fried oysters okay    . Other Nausea Only    Unknown allergy Pain medications, Pt states all kinds of pain medications she is unable to take except tylenol  . Oxycodone-Acetaminophen Nausea And Vomiting  . Penicillins  Itching    Allergies as of 09/22/2019      Reactions   Cat Hair Extract Other (See Comments)   Patient states that it causes her eyes to swell shut   Cefaclor Other (See Comments)   unknown   Codeine Nausea Only   Iodinated Diagnostic Agents Other (See Comments)   Shrimp caused stomach pain and nausea Per patient can eat fried oysters okay   Other Nausea Only   Unknown allergy Pain medications, Pt states all kinds of pain medications she is unable to take except tylenol   Oxycodone-acetaminophen Nausea And Vomiting   Penicillins Itching      Medication List       Accurate as of September 22, 2019  4:12 PM. If you have any questions, ask your nurse or doctor.        atorvastatin 10 MG tablet Commonly known as: LIPITOR Take 0.5 tablets (5 mg total) by mouth daily.   Calcium 600-400 MG-UNIT Chew Chew by mouth 2 (two) times a day.   clonazePAM 0.5 MG tablet Commonly known as: KLONOPIN TAKE 1&1/2 TABLETS AT BEDTIME AS NEEDED FOR SLEEP.   Golytely 236 g solution Generic drug: polyethylene glycol MIX WITH WATER AND DRINK 3-3 1/2 OUNCES DAILY AS DIRECTED.   PRESERVISION AREDS PO Take 2 tablets by mouth.   TEGretol  200 MG tablet Generic drug: carbamazepine TAKE 1 & 1/2 TABLETS ONCE DAILY.   Vitamin D (Ergocalciferol) 1.25 MG (50000 UNIT) Caps capsule Commonly known as: DRISDOL TAKE 1 CAPSULE WEEKLY.       Review of Systems:  Review of Systems  Constitutional: Negative for appetite change, fatigue and fever.  HENT: Positive for hearing loss. Negative for congestion and voice change.   Eyes: Negative for visual disturbance.  Respiratory: Negative for cough and shortness of breath.   Cardiovascular: Negative for leg swelling.  Gastrointestinal: Negative for abdominal distention, abdominal pain and constipation.  Genitourinary: Positive for frequency. Negative for difficulty urinating, dysuria and urgency.  Musculoskeletal: Positive for arthralgias and gait problem.        Mid back pain. Left 5th toe fx a year ago, uses boot for pain relief.   Skin: Negative for color change.  Neurological: Positive for numbness. Negative for dizziness, seizures, speech difficulty and weakness.       Tingling/numbness left foot sometimes  Psychiatric/Behavioral: Positive for dysphoric mood and sleep disturbance. Negative for agitation and behavioral problems. The patient is not nervous/anxious.     Health Maintenance  Topic Date Due  . Hepatitis C Screening  Never done  . FOOT EXAM  Never done  . OPHTHALMOLOGY EXAM  Never done  . URINE MICROALBUMIN  Never done  . MAMMOGRAM  08/13/2017  . HEMOGLOBIN A1C  11/25/2017  . INFLUENZA VACCINE  09/18/2019  . TETANUS/TDAP  08/21/2027  . DEXA SCAN  Completed  . COVID-19 Vaccine  Completed  . PNA vac Low Risk Adult  Completed    Physical Exam: Vitals:   09/22/19 1458  BP: (!) 112/58  Pulse: 71  Temp: 97.8 F (36.6 C)  SpO2: 98%  Weight: 104 lb 9.6 oz (47.4 kg)  Height: 5' 7"  (1.702 m)   Body mass index is 16.38 kg/m. Physical Exam Vitals and nursing note reviewed.  Constitutional:      Appearance: Normal appearance.  HENT:     Head: Normocephalic and atraumatic.  Eyes:     Extraocular Movements: Extraocular movements intact.     Conjunctiva/sclera: Conjunctivae normal.     Pupils: Pupils are equal, round, and reactive to light.  Cardiovascular:     Rate and Rhythm: Normal rate and regular rhythm.     Heart sounds: No murmur heard.   Pulmonary:     Breath sounds: No wheezing or rales.  Abdominal:     General: Bowel sounds are normal.     Palpations: Abdomen is soft.     Tenderness: There is no abdominal tenderness.  Musculoskeletal:     Cervical back: Normal range of motion and neck supple.     Right lower leg: No edema.     Left lower leg: No edema.  Skin:    General: Skin is warm and dry.  Neurological:     General: No focal deficit present.     Mental Status: She is alert and oriented to person,  place, and time. Mental status is at baseline.     Gait: Gait normal.  Psychiatric:        Mood and Affect: Mood normal.        Behavior: Behavior normal.        Thought Content: Thought content normal.     Comments: Appears anxious, obsessed with her weight     Labs reviewed: Basic Metabolic Panel: Recent Labs    12/14/18 1040 12/14/18 1040 03/07/19 0000 03/08/19 0725 06/08/19 6415  NA 144  --   --  142 144  K 4.1  --   --  3.9 3.9  CL 106  --   --  105 105  CO2 27  --   --  29 27  GLUCOSE 114*   < > CANCELED 102* 100*  BUN 17  --   --  17 19  CREATININE 0.75  --   --  0.79 0.75  CALCIUM 9.4  --   --  9.4 9.7  TSH 2.33  --   --   --  3.09   < > = values in this interval not displayed.   Liver Function Tests: Recent Labs    12/14/18 1040 03/08/19 0725 06/08/19 0928  AST 16 16 26   ALT 10 9 25   BILITOT 0.5 0.5 0.6  PROT 6.5 6.2 6.4   No results for input(s): LIPASE, AMYLASE in the last 8760 hours. No results for input(s): AMMONIA in the last 8760 hours. CBC: Recent Labs    12/14/18 1040 12/14/18 1040 03/07/19 0000 03/08/19 0725 06/08/19 0928  WBC 5.3   < > CANCELED 4.8 4.7  NEUTROABS 3,360  --   --  2,606 2,641  HGB 13.7  --   --  14.3 14.5  HCT 40.8  --   --  42.3 43.5  MCV 96.7  --   --  96.4 97.8  PLT 215  --   --  204 184   < > = values in this interval not displayed.   Lipid Panel: Recent Labs    03/07/19 0000 03/08/19 0725  CHOL  --  228*  HDL  --  58  LDLCALC CANCELED 145*  TRIG  --  129  CHOLHDL  --  3.9   Lab Results  Component Value Date   HGBA1C 5.9 05/26/2017    Procedures since last visit: DG BONE DENSITY (DXA)  Result Date: 09/13/2019 EXAM: DUAL X-RAY ABSORPTIOMETRY (DXA) FOR BONE MINERAL DENSITY IMPRESSION: Referring Physician:  Cylee Dattilo X Anyela Napierkowski Your patient completed a BMD test using Lunar IDXA DXA system ( analysis version: 16 ) manufactured by [employer]. Technologist: AW PATIENT: Name: Avyonna, Wagoner Patient ID: [id number]  Birth Date: 1940-05-28 Height: 66.0 in. Sex: Female Measured: 09/13/2019 Weight: 104.0 lbs. Indications: Advanced Age, Bilateral Ovariectomy (65.51), Caucasian, Estrogen Deficient, Family Hist. (Parent hip fracture), Hysterectomy, Klonopin, Osteoporosis (64), Postmenopausal, Tegretol Fractures: Left wrist Treatments: Calcium (E943.0), Vitamin D (E933.5) ASSESSMENT: The BMD measured at Femur Total Right is 0.560 g/cm2 with a T-score of -3.6. This patient is considered osteoporotic according to [facility]) criteria. The scan quality is good. Site Region Measured Date Measured Age YA BMD Significant CHANGE T-score AP Spine  L1-L4       [age]2019    [age]         -3.4    0.767 g/cm2 DualFemur Total Right 09[age]13/2019    79.1         -3.6    0.560 g/cm2 DualFemur Total Mean  09[age]13/2019    79.1         -3.4    0.581 g/cm2 World Health Organization [facility]) criteria for post-menopausal, Caucasian Women: Normal       T-score at or above -1 SD Osteopenia   T-score between -1 and -2.5 SD Osteoporosis T-score at or below -2.5 SD RECOMMENDATION: 1. All patients should optimize calcium and vitamin D intake. 2. Consider FDA approved medical therapies in postmenopausal women and men aged 10 years and  older, based on the following: a. A hip or vertebral (clinical or morphometric) fracture b. T- score < or = -2.5 at the femoral neck or spine after appropriate evaluation to exclude secondary causes c. Low bone mass (T-score between -1.0 and -2.5 at the femoral neck or spine) and a 10 year probability of a hip fracture > or = 3% or a 10 year probability of a major osteoporosis-related fracture > or = 20% based on the [place]-adapted WHO algorithm d. Clinician judgment and/or patient preferences may indicate treatment for people with 10-year fracture probabilities above or below these levels FOLLOW-UP: Patients with diagnosis of osteoporosis or at high risk for fracture should have regular bone mineral density tests. For  patients eligible for Medicare, routine testing is allowed once every 2 years. The testing frequency can be increased to one year for patients who have rapidly progressing disease, those who are receiving or discontinuing medical therapy to restore bone mass, or have additional risk factors. I have reviewed this report and agree with the above findings. [facility] Electronically Signed   By: Rolm Baptise M.D.   On: 09/13/2019 16:33    Assessment/Plan  Epilepsy without status epilepticus, not intractable ([name])  no active seizures since last seen, continue Tegretol 211m qd, Clonazepam 0.515mprn hs.   Constipation stable, continue  Golytely prn, Gavilyte prn.     Osteoporosis 08/13/69 DEXA t score -3.0 09/14/19 DEXA t score -3.6 09/22/19 continue Ca, Vit D, Reclast x3 last inj 2017, due 2019, Needs referral.   Pre-diabetes Pending Hgb a1c, Urine albumin, lipid panel.   Hyperlipidemia Diet, exercise, update lipid panel.    Labs/tests ordered: Hgb a1c, urine albumin, Lipid panel pending, CMP/eGFR  Next appt:  10/13/2019

## 2019-09-26 ENCOUNTER — Other Ambulatory Visit: Payer: Self-pay | Admitting: Nurse Practitioner

## 2019-09-26 DIAGNOSIS — E785 Hyperlipidemia, unspecified: Secondary | ICD-10-CM | POA: Diagnosis not present

## 2019-09-26 DIAGNOSIS — R7303 Prediabetes: Secondary | ICD-10-CM

## 2019-09-27 ENCOUNTER — Other Ambulatory Visit: Payer: Self-pay

## 2019-09-28 LAB — COMPLETE METABOLIC PANEL WITH GFR
AG Ratio: 2.2 (calc) (ref 1.0–2.5)
ALT: 17 U/L (ref 6–29)
AST: 18 U/L (ref 10–35)
Albumin: 4.1 g/dL (ref 3.6–5.1)
Alkaline phosphatase (APISO): 50 U/L (ref 37–153)
BUN: 22 mg/dL (ref 7–25)
CO2: 31 mmol/L (ref 20–32)
Calcium: 9.2 mg/dL (ref 8.6–10.4)
Chloride: 105 mmol/L (ref 98–110)
Creat: 0.74 mg/dL (ref 0.60–0.93)
GFR, Est African American: 89 mL/min/{1.73_m2} (ref >=60)
GFR, Est Non African American: 77 mL/min/{1.73_m2} (ref >=60)
Globulin: 1.9 g/dL (calc) (ref 1.9–3.7)
Glucose, Bld: 99 mg/dL (ref 65–99)
Potassium: 3.7 mmol/L (ref 3.5–5.3)
Sodium: 142 mmol/L (ref 135–146)
Total Bilirubin: 0.5 mg/dL (ref 0.2–1.2)
Total Protein: 6 g/dL — ABNORMAL LOW (ref 6.1–8.1)

## 2019-09-28 LAB — LIPID PANEL
Cholesterol: 158 mg/dL (ref <200)
HDL: 59 mg/dL (ref >=50)
LDL Cholesterol (Calc): 77 mg/dL (calc)
Non-HDL Cholesterol (Calc): 99 mg/dL (calc) (ref <130)
Total CHOL/HDL Ratio: 2.7 (calc) (ref <5.0)
Triglycerides: 132 mg/dL (ref <150)

## 2019-09-28 LAB — HEMOGLOBIN A1C
Hgb A1c MFr Bld: 5.5 % of total Hgb (ref <5.7)
Mean Plasma Glucose: 111 (calc)
eAG (mmol/L): 6.2 (calc)

## 2019-09-28 LAB — MICROALBUMIN / CREATININE URINE RATIO

## 2019-10-03 ENCOUNTER — Telehealth: Payer: Self-pay

## 2019-10-03 NOTE — Telephone Encounter (Signed)
Reclast paper work/order has been signed and faxed to Short Stay. Appointment is Tuesday August 24th at 2p at [address]. Admitting will bring her to her appointment.   DWP. No further questions. Forms sent for scanning.

## 2019-10-06 ENCOUNTER — Other Ambulatory Visit: Payer: Self-pay

## 2019-10-06 ENCOUNTER — Encounter: Payer: Self-pay | Admitting: Nurse Practitioner

## 2019-10-06 ENCOUNTER — Non-Acute Institutional Stay: Payer: Medicare Other | Admitting: Nurse Practitioner

## 2019-10-06 DIAGNOSIS — R7303 Prediabetes: Secondary | ICD-10-CM | POA: Diagnosis not present

## 2019-10-06 DIAGNOSIS — K5904 Chronic idiopathic constipation: Secondary | ICD-10-CM | POA: Diagnosis not present

## 2019-10-06 DIAGNOSIS — G40822 Epileptic spasms, not intractable, without status epilepticus: Secondary | ICD-10-CM

## 2019-10-06 DIAGNOSIS — F419 Anxiety disorder, unspecified: Secondary | ICD-10-CM | POA: Diagnosis not present

## 2019-10-06 NOTE — Assessment & Plan Note (Signed)
LDL is at goal, 77 09/26/19, continue Atorvastatin.

## 2019-10-06 NOTE — Assessment & Plan Note (Signed)
stable, on Golytely prn, Gavilyte prn.  

## 2019-10-06 NOTE — Progress Notes (Signed)
Location:   clinic [facility]   Place of Service:  Clinic (12) Provider: Chipper Oman NP  Code Status: DNR Goals of Care: [place] 07/29/2018  Does Patient Have a Medical Advance Directive? Yes  Type of Advance Directive Living will;Healthcare Power of Attorney  Does patient want to make changes to medical advance directive? No - Patient declined  Copy of Healthcare Power of Attorney in Chart? Yes - validated most recent copy scanned in chart (See row information)     Chief Complaint  Patient presents with  . Medical Management of Chronic Issues    Patient returns to the clinic discuss xray results.  . Health Maintenance    Hep C screen, Foot exam. Patient will be seeing ophthamologist for eye exam tomorrow, mammogram    HPI: Patient is a 79 y.o. female seen today for medical management of chronic diseases.                  Seizures, no active seizures since last seen, on Tegretol 200mg  qd, Clonazepam 0.5mg  prn hs.             Constipation, stable, on Golytely prn, Gavilyte prn.  Hyperlipidemia, LDL 77 09/26/19, takes Atorvastatin 5mg  qd   Prediabetic, Hgb a1c 5.5 09/26/19, due for foot/eye exam   Past Medical History:  Diagnosis Date  . Anxiety   . Cataract   . Chronic constipation   . Depression   . Osteoporosis   . Seizures ([name])    epilepsy  last seizure 1977    Past Surgical History:  Procedure Laterality Date  . ABDOMINAL HYSTERECTOMY  1995   Dr.  . CATARACT EXTRACTION Bilateral 12/2016  . COLONOSCOPY  2012   patchy increased intraepithelial lymphocytes - draelos  . ESOPHAGOGASTRODUODENOSCOPY     multiple  . FISSURECTOMY    . WRIST SURGERY Left    due to fracture    Allergies  Allergen Reactions  . Cat Hair Extract Other (See Comments)    Patient states that it causes her eyes to swell shut  . Cefaclor Other (See Comments)    unknown  . Codeine Nausea Only  . Iodinated Diagnostic Agents Other (See Comments)    Shrimp caused  stomach pain and nausea Per patient can eat fried oysters okay    . Other Nausea Only    Unknown allergy Pain medications, Pt states all kinds of pain medications she is unable to take except tylenol  . Oxycodone-Acetaminophen Nausea And Vomiting  . Penicillins Itching    Allergies as of 10/06/2019      Reactions   Cat Hair Extract Other (See Comments)   Patient states that it causes her eyes to swell shut   Cefaclor Other (See Comments)   unknown   Codeine Nausea Only   Iodinated Diagnostic Agents Other (See Comments)   Shrimp caused stomach pain and nausea Per patient can eat fried oysters okay   Other Nausea Only   Unknown allergy Pain medications, Pt states all kinds of pain medications she is unable to take except tylenol   Oxycodone-acetaminophen Nausea And Vomiting   Penicillins Itching      Medication List       Accurate as of October 06, 2019 11:59 PM. If you have any questions, ask your nurse or doctor.        atorvastatin 10 MG tablet Commonly known as: LIPITOR Take 0.5 tablets (5 mg total) by mouth daily.   Calcium 600-400 MG-UNIT Chew  Chew by mouth 2 (two) times a day.   clonazePAM 0.5 MG tablet Commonly known as: KLONOPIN TAKE 1&1/2 TABLETS AT BEDTIME AS NEEDED FOR SLEEP.   Golytely 236 g solution Generic drug: polyethylene glycol MIX WITH WATER AND DRINK 3-3 1/2 OUNCES DAILY AS DIRECTED.   PRESERVISION AREDS PO Take 2 tablets by mouth.   TEGretol 200 MG tablet Generic drug: carbamazepine TAKE 1 & 1/2 TABLETS ONCE DAILY.   Vitamin D (Ergocalciferol) 1.25 MG (50000 UNIT) Caps capsule Commonly known as: DRISDOL TAKE 1 CAPSULE WEEKLY.       Review of Systems:  Review of Systems  Constitutional: Negative for appetite change, fatigue and fever.  HENT: Positive for hearing loss. Negative for congestion and voice change.   Eyes: Negative for visual disturbance.  Respiratory: Negative for cough and shortness of breath.   Cardiovascular:  Negative for leg swelling.  Gastrointestinal: Negative for abdominal distention, abdominal pain and constipation.  Genitourinary: Positive for frequency. Negative for difficulty urinating, dysuria and urgency.  Musculoskeletal: Positive for arthralgias and gait problem.       Mid back pain. Left 5th toe fx a year ago, uses boot for pain relief. Left hip/leg pain  Skin: Negative for color change.  Neurological: Positive for numbness. Negative for dizziness, seizures, speech difficulty and weakness.       Tingling/numbness left foot sometimes. Occasionally left hip/leg pain sometimes.   Psychiatric/Behavioral: Positive for dysphoric mood and sleep disturbance. Negative for agitation and behavioral problems. The patient is not nervous/anxious.     Health Maintenance  Topic Date Due  . Hepatitis C Screening  Never done  . FOOT EXAM  Never done  . OPHTHALMOLOGY EXAM  Never done  . MAMMOGRAM  08/13/2017  . INFLUENZA VACCINE  09/18/2019  . HEMOGLOBIN A1C  03/28/2020  . URINE MICROALBUMIN  09/25/2020  . TETANUS/TDAP  08/21/2027  . DEXA SCAN  Completed  . COVID-19 Vaccine  Completed  . PNA vac Low Risk Adult  Completed    Physical Exam: Vitals:   10/06/19 1448  BP: (!) 110/58  Pulse: 79  Temp: (!) 95.9 F (35.5 C)  SpO2: 97%  Weight: 105 lb 6.4 oz (47.8 kg)  Height: 5\' 7"  (1.702 m)   Body mass index is 16.51 kg/m. Physical Exam Vitals and nursing note reviewed.  Constitutional:      Appearance: Normal appearance.  HENT:     Head: Normocephalic and atraumatic.  Eyes:     Extraocular Movements: Extraocular movements intact.     Conjunctiva/sclera: Conjunctivae normal.     Pupils: Pupils are equal, round, and reactive to light.  Cardiovascular:     Rate and Rhythm: Normal rate and regular rhythm.     Heart sounds: No murmur heard.   Pulmonary:     Breath sounds: No wheezing or rales.  Abdominal:     General: Bowel sounds are normal.     Palpations: Abdomen is soft.      Tenderness: There is no abdominal tenderness.  Musculoskeletal:     Cervical back: Normal range of motion and neck supple.     Right lower leg: No edema.     Left lower leg: No edema.  Skin:    General: Skin is warm and dry.  Neurological:     General: No focal deficit present.     Mental Status: She is alert and oriented to person, place, and time. Mental status is at baseline.     Gait: Gait normal.  Psychiatric:  Mood and Affect: Mood normal.        Behavior: Behavior normal.        Thought Content: Thought content normal.     Comments: Appears anxious, obsessed with her weight     Labs reviewed: Basic Metabolic Panel: Recent Labs    12/14/18 1040 03/07/19 0000 03/08/19 0725 06/08/19 0928 09/26/19 1427  NA 144  --  142 144 142  K 4.1  --  3.9 3.9 3.7  CL 106  --  105 105 105  CO2 27  --  29 27 31   GLUCOSE 114*   < > 102* 100* 99  BUN 17  --  17 19 22   CREATININE 0.75  --  0.79 0.75 0.74  CALCIUM 9.4  --  9.4 9.7 9.2  TSH 2.33  --   --  3.09  --    < > = values in this interval not displayed.   Liver Function Tests: Recent Labs    03/08/19 0725 06/08/19 0928 09/26/19 1427  AST 16 26 18   ALT 9 25 17   BILITOT 0.5 0.6 0.5  PROT 6.2 6.4 6.0*   No results for input(s): LIPASE, AMYLASE in the last 8760 hours. No results for input(s): AMMONIA in the last 8760 hours. CBC: Recent Labs    12/14/18 1040 12/14/18 1040 03/07/19 0000 03/08/19 0725 06/08/19 0928  WBC 5.3   < > CANCELED 4.8 4.7  NEUTROABS 3,360  --   --  2,606 2,641  HGB 13.7  --   --  14.3 14.5  HCT 40.8  --   --  42.3 43.5  MCV 96.7  --   --  96.4 97.8  PLT 215  --   --  204 184   < > = values in this interval not displayed.   Lipid Panel: Recent Labs    03/07/19 0000 03/08/19 0725 09/26/19 1427  CHOL  --  228* 158  HDL  --  58 59  LDLCALC CANCELED 145* 77  TRIG  --  129 132  CHOLHDL  --  3.9 2.7   Lab Results  Component Value Date   HGBA1C 5.5 09/26/2019    Procedures  since last visit: DG BONE DENSITY (DXA)  Result Date: 09/13/2019 EXAM: DUAL X-RAY ABSORPTIOMETRY (DXA) FOR BONE MINERAL DENSITY IMPRESSION: Referring Physician:  Cadee Agro X Bernadene Garside Your patient completed a BMD test using Lunar IDXA DXA system ( analysis version: 16 ) manufactured by [employer]. Technologist: AW PATIENT: Name: Erin Ochoa, Rosalie Patient ID: [id number] Birth Date: Aug 08, 1940 Height: 66.0 in. Sex: Female Measured: 09/13/2019 Weight: 104.0 lbs. Indications: Advanced Age, Bilateral Ovariectomy (65.51), Caucasian, Estrogen Deficient, Family Hist. (Parent hip fracture), Hysterectomy, Klonopin, Osteoporosis (733), Postmenopausal, Tegretol Fractures: Left wrist Treatments: Calcium (E943.0), Vitamin D (E933.5) ASSESSMENT: The BMD measured at Femur Total Right is 0.560 g/cm2 with a T-score of -3.6. This patient is considered osteoporotic according to World Health Organization [facility](WHO) criteria. The scan quality is good. Site Region Measured Date Measured Age YA BMD Significant CHANGE T-score AP Spine  L1-L4       [age]2019    [age]         -3.4    0.767 g/cm2 DualFemur Total Right 09[age]13/2019    79.1         -3.6    0.560 g/cm2 DualFemur Total Mean  09[age]13/2019    79.1         -3.4    0.581 g/cm2 World Health Organization [facility](WHO) criteria for post-menopausal, Caucasian  Women: Normal       T-score at or above -1 SD Osteopenia   T-score between -1 and -2.5 SD Osteoporosis T-score at or below -2.5 SD RECOMMENDATION: 1. All patients should optimize calcium and vitamin D intake. 2. Consider FDA approved medical therapies in postmenopausal women and men aged 64 years and older, based on the following: a. A hip or vertebral (clinical or morphometric) fracture b. T- score < or = -2.5 at the femoral neck or spine after appropriate evaluation to exclude secondary causes c. Low bone mass (T-score between -1.0 and -2.5 at the femoral neck or spine) and a 10 year probability of a hip fracture > or = 3% or a 10 year probability of a  major osteoporosis-related fracture > or = 20% based on the [place]-adapted WHO algorithm d. Clinician judgment and/or patient preferences may indicate treatment for people with 10-year fracture probabilities above or below these levels FOLLOW-UP: Patients with diagnosis of osteoporosis or at high risk for fracture should have regular bone mineral density tests. For patients eligible for Medicare, routine testing is allowed once every 2 years. The testing frequency can be increased to one year for patients who have rapidly progressing disease, those who are receiving or discontinuing medical therapy to restore bone mass, or have additional risk factors. I have reviewed this report and agree with the above findings. [facility] Electronically Signed   By: Charlett Nose M.D.   On: 09/13/2019 16:33    Assessment/Plan  Hyperlipidemia LDL is at goal, 77 09/26/19, continue Atorvastatin.   Pre-diabetes Prediabetic, Hgb a1c 5.5 09/26/19, due for foot exam-currently no podiatrist. eye exam tomorrow    Constipation stable, on Golytely prn, Gavilyte prn.   Epilepsy without status epilepticus, not intractable ([name]) Seizures, no active seizures since last seen, on Tegretol 200mg  qd, Clonazepam 0.5mg  prn hs.   Anxiety Obsessed with low body weight, stated desiring weight gain, but the patient watches her diet closely, no sugar at all, taking protein shakes,  Her weight has been the same in the last 4 months. Declined    Labs/tests ordered: the patient will get mammogram at breast center.   Next appt:  10/21/2019

## 2019-10-06 NOTE — Assessment & Plan Note (Signed)
Seizures, no active seizures since last seen, on Tegretol 200mg  qd, Clonazepam 0.5mg  prn hs.

## 2019-10-06 NOTE — Assessment & Plan Note (Signed)
Obsessed with low body weight, stated desiring weight gain, but the patient watches her diet closely, no sugar at all, taking protein shakes,  Her weight has been the same in the last 4 months. Declined

## 2019-10-06 NOTE — Assessment & Plan Note (Addendum)
Prediabetic, Hgb a1c 5.5 09/26/19, due for foot exam-currently no podiatrist. eye exam tomorrow

## 2019-10-07 ENCOUNTER — Encounter: Payer: Self-pay | Admitting: Nurse Practitioner

## 2019-10-07 DIAGNOSIS — H35013 Changes in retinal vascular appearance, bilateral: Secondary | ICD-10-CM | POA: Diagnosis not present

## 2019-10-07 DIAGNOSIS — H43393 Other vitreous opacities, bilateral: Secondary | ICD-10-CM | POA: Diagnosis not present

## 2019-10-07 DIAGNOSIS — H40013 Open angle with borderline findings, low risk, bilateral: Secondary | ICD-10-CM | POA: Diagnosis not present

## 2019-10-07 DIAGNOSIS — H353132 Nonexudative age-related macular degeneration, bilateral, intermediate dry stage: Secondary | ICD-10-CM | POA: Diagnosis not present

## 2019-10-10 ENCOUNTER — Other Ambulatory Visit (HOSPITAL_COMMUNITY): Payer: Self-pay

## 2019-10-10 NOTE — Discharge Instructions (Signed)

## 2019-10-11 ENCOUNTER — Other Ambulatory Visit: Payer: Self-pay | Admitting: Nurse Practitioner

## 2019-10-11 ENCOUNTER — Ambulatory Visit (HOSPITAL_COMMUNITY)
Admission: RE | Admit: 2019-10-11 | Discharge: 2019-10-11 | Disposition: A | Discharge status: Home / Self Care | Payer: Medicare Other | Source: Ambulatory Visit | Attending: Nurse Practitioner | Admitting: Nurse Practitioner

## 2019-10-11 ENCOUNTER — Other Ambulatory Visit: Payer: Self-pay

## 2019-10-11 DIAGNOSIS — M810 Age-related osteoporosis without current pathological fracture: Secondary | ICD-10-CM | POA: Diagnosis not present

## 2019-10-11 DIAGNOSIS — G4700 Insomnia, unspecified: Secondary | ICD-10-CM

## 2019-10-11 DIAGNOSIS — G40909 Epilepsy, unspecified, not intractable, without status epilepticus: Secondary | ICD-10-CM

## 2019-10-11 MED ORDER — ZOLEDRONIC ACID 5 MG/100ML IV SOLN
INTRAVENOUS | Status: AC
  Administered 2019-10-11: 5 mg via INTRAVENOUS
  Filled 2019-10-11: qty 100

## 2019-10-11 MED ORDER — ZOLEDRONIC ACID 5 MG/100ML IV SOLN: 5.0000 mg | Freq: Once | INTRAVENOUS | Status: AC

## 2019-10-11 NOTE — Telephone Encounter (Signed)
Patient does not have a non-opioid contract.

## 2019-10-20 ENCOUNTER — Other Ambulatory Visit: Payer: Self-pay | Admitting: Nurse Practitioner

## 2019-10-21 ENCOUNTER — Non-Acute Institutional Stay: Payer: Medicare Other | Admitting: Internal Medicine

## 2019-10-21 ENCOUNTER — Other Ambulatory Visit: Payer: Self-pay

## 2019-10-21 ENCOUNTER — Encounter: Payer: Self-pay | Admitting: Internal Medicine

## 2019-10-21 VITALS — BP 118/60 | HR 77 | Temp 96.0°F | Ht 67.0 in | Wt 105.4 lb

## 2019-10-21 DIAGNOSIS — G40822 Epileptic spasms, not intractable, without status epilepticus: Secondary | ICD-10-CM

## 2019-10-21 DIAGNOSIS — K5904 Chronic idiopathic constipation: Secondary | ICD-10-CM | POA: Diagnosis not present

## 2019-10-21 DIAGNOSIS — M810 Age-related osteoporosis without current pathological fracture: Secondary | ICD-10-CM

## 2019-10-21 DIAGNOSIS — E785 Hyperlipidemia, unspecified: Secondary | ICD-10-CM | POA: Diagnosis not present

## 2019-10-21 DIAGNOSIS — K3189 Other diseases of stomach and duodenum: Secondary | ICD-10-CM

## 2019-10-21 DIAGNOSIS — G4700 Insomnia, unspecified: Secondary | ICD-10-CM | POA: Diagnosis not present

## 2019-10-21 DIAGNOSIS — R7303 Prediabetes: Secondary | ICD-10-CM

## 2019-10-24 NOTE — Progress Notes (Signed)
Location:  [facility] teacher of Service:  Clinic (12)  Provider:   Code Status:  Goals of Care:  Advanced Directives 07/29/2018  Does Patient Have a Medical Advance Directive? Yes  Type of Advance Directive Living will;Healthcare Power of Attorney  Does patient want to make changes to medical advance directive? No - Patient declined  Copy of Healthcare Power of Attorney in Chart? Yes - validated most recent copy scanned in chart (See row information)     Chief Complaint  Patient presents with  . Medical Management of Chronic Issues    Patient returns to the clinic for follow up.     HPI: Patient is a 79 y.o. female seen today for medical management of chronic diseases.    History of seizures Has been stable on Tegretol Has seen Dr. Daisy Blossom in 2019 Does not follow-up with her Osteoporosis Has received now 4 doses of Reclast. Last dose few weeks ago Is not sure if eventually she wants to go on Prolia Is due for her next DEXA Insomnia Begin very low-dose of Klonopin.  Has reduced dependence 0.75 mg. Macular degeneration Follows closely with ophthalmologist Depression Has concerns after she lost her husband few years ago But states now she has adjusted to friend's home  Did not have much family her pastor is her POA   Past Medical History:  Diagnosis Date  . Anxiety   . Cataract   . Chronic constipation   . Depression   . Osteoporosis   . Seizures ([name])    epilepsy  last seizure 1977    Past Surgical History:  Procedure Laterality Date  . ABDOMINAL HYSTERECTOMY  1995   Dr. Carlis Abbott  . CATARACT EXTRACTION Bilateral 12/2016  . COLONOSCOPY  2012   patchy increased intraepithelial lymphocytes - draelos  . ESOPHAGOGASTRODUODENOSCOPY     multiple  . FISSURECTOMY    . WRIST SURGERY Left    due to fracture    Allergies  Allergen Reactions  . Cat Hair Extract Other (See Comments)    Patient states that it causes her eyes to swell shut  .  Cefaclor Other (See Comments)    unknown  . Codeine Nausea Only  . Iodinated Diagnostic Agents Other (See Comments)    Shrimp caused stomach pain and nausea Per patient can eat fried oysters okay    . Other Nausea Only    Unknown allergy Pain medications, Pt states all kinds of pain medications she is unable to take except tylenol  . Oxycodone-Acetaminophen Nausea And Vomiting  . Penicillins Itching    Outpatient Encounter Medications as of 10/21/2019  Medication Sig  . Calcium 600-400 MG-UNIT CHEW Chew by mouth 2 (two) times a day.   . clonazePAM (KLONOPIN) 0.5 MG tablet TAKE 1&1/2 TABLETS AT BEDTIME AS NEEDED FOR SLEEP.  Marland Kitchen GOLYTELY 236 g solution MIX WITH WATER AND DRINK 3-3 1/2 OUNCES DAILY AS DIRECTED.  . Multiple Vitamins-Minerals (PRESERVISION AREDS PO) Take 2 tablets by mouth.  . TEGRETOL 200 MG tablet TAKE 1 & 1/2 TABLETS ONCE DAILY.  Marland Kitchen Vitamin D, Ergocalciferol, (DRISDOL) 1.25 MG (50000 UNIT) CAPS capsule TAKE 1 CAPSULE WEEKLY.  Marland Kitchen atorvastatin (LIPITOR) 10 MG tablet Take 0.5 tablets (5 mg total) by mouth daily.   No facility-administered encounter medications on file as of 10/21/2019.    Review of Systems:  Review of Systems  Review of Systems  Constitutional: Negative for activity change, appetite change, chills, diaphoresis, fatigue and fever.  HENT: Negative  for mouth sores, postnasal drip, rhinorrhea, sinus pain and sore throat.   Respiratory: Negative for apnea, cough, chest tightness, shortness of breath and wheezing.   Cardiovascular: Negative for chest pain, palpitations and leg swelling.  Gastrointestinal: Negative for abdominal distention, abdominal pain, constipation, diarrhea, nausea and vomiting.  Genitourinary: Negative for dysuria and frequency.  Musculoskeletal: Negative for arthralgias, joint swelling and myalgias.  Skin: Negative for rash.  Neurological: Negative for dizziness, syncope, weakness, light-headedness and numbness.  Psychiatric/Behavioral:  Negative for behavioral problems, confusion and sleep disturbance.     Health Maintenance  Topic Date Due  . Hepatitis C Screening  Never done  . FOOT EXAM  Never done  . OPHTHALMOLOGY EXAM  Never done  . MAMMOGRAM  08/13/2017  . INFLUENZA VACCINE  09/18/2019  . HEMOGLOBIN A1C  03/28/2020  . URINE MICROALBUMIN  09/25/2020  . TETANUS/TDAP  08/21/2027  . DEXA SCAN  Completed  . COVID-19 Vaccine  Completed  . PNA vac Low Risk Adult  Completed    Physical Exam: Vitals:   10/21/19 1129  BP: 118/60  Pulse: 77  Temp: (!) 96 F (35.6 C)  SpO2: 96%  Weight: 105 lb 6.4 oz (47.8 kg)  Height: 5\' 7"  (1.702 m)   Body mass index is 16.51 kg/m. Physical Exam Constitutional: Oriented to person, place, and time. Well-developed and well-nourished.  HENT:  Head: Normocephalic.  Mouth/Throat: Oropharynx is clear and moist.  Eyes: Pupils are equal, round, and reactive to light.  Neck: Neck supple.  Ears No Wax TM normal Cardiovascular: Normal rate and normal heart sounds.  No murmur heard. Pulmonary/Chest: Effort normal and breath sounds normal. No respiratory distress. No wheezes. She has no rales.  Abdominal: Soft. Bowel sounds are normal. No distension. There is no tenderness. There is no rebound.  Musculoskeletal: No edema.  Lymphadenopathy: none Neurological: Alert and oriented to person, place, and time.  Walks with no Issues Gait stable. No Assist Device Skin: Skin is warm and dry.  Psychiatric: Normal mood and affect. Behavior is normal. Thought content normal.   Labs reviewed: Basic Metabolic Panel: Recent Labs    12/14/18 1040 03/07/19 0000 03/08/19 0725 06/08/19 0928 09/26/19 1427  NA 144  --  142 144 142  K 4.1  --  3.9 3.9 3.7  CL 106  --  105 105 105  CO2 27  --  29 27 31   GLUCOSE 114*   < > 102* 100* 99  BUN 17  --  17 19 22   CREATININE 0.75  --  0.79 0.75 0.74  CALCIUM 9.4  --  9.4 9.7 9.2  TSH 2.33  --   --  3.09  --    < > = values in this interval  not displayed.   Liver Function Tests: Recent Labs    03/08/19 0725 06/08/19 0928 09/26/19 1427  AST 16 26 18   ALT 9 25 17   BILITOT 0.5 0.6 0.5  PROT 6.2 6.4 6.0*   No results for input(s): LIPASE, AMYLASE in the last 8760 hours. No results for input(s): AMMONIA in the last 8760 hours. CBC: Recent Labs    12/14/18 1040 12/14/18 1040 03/07/19 0000 03/08/19 0725 06/08/19 0928  WBC 5.3   < > CANCELED 4.8 4.7  NEUTROABS 3,360  --   --  2,606 2,641  HGB 13.7  --   --  14.3 14.5  HCT 40.8  --   --  42.3 43.5  MCV 96.7  --   --  96.4  97.8  PLT 215  --   --  204 184   < > = values in this interval not displayed.   Lipid Panel: Recent Labs    03/07/19 0000 03/08/19 0725 09/26/19 1427  CHOL  --  228* 158  HDL  --  58 59  LDLCALC CANCELED 145* 77  TRIG  --  129 132  CHOLHDL  --  3.9 2.7   Lab Results  Component Value Date   HGBA1C 5.5 09/26/2019    Procedures since last visit: No results found.  Assessment/Plan  Nonintractable epileptic spasms without status epilepticus ([name]) Stable on Tegretol Pre-diabetes A1C in Normal Range Can Liberalize her Diet We talked and Discussed about Moderate Diet Control Hyperlipidemia LDL goal <100 LDL in Good Level on Lipitor Osteoporosis, unspecified osteoporosis type, unspecified pathological fracture presence Got 4 doses of Reclast Repeat DEXA due Insomnia, unspecified type Takes Klonopin Contract signed H/o Erosive gastropathy Not taking Prilosec anymore Tries to avoid Different Foods to help her symptoms Talked about trying OTC Pepcid with PRN omepraz Chronic idiopathic constipation Takes Gavilyte Labs/tests ordered:  * No order type specified * Next appt:  Visit date not found

## 2019-11-14 ENCOUNTER — Other Ambulatory Visit: Payer: Self-pay | Admitting: Nurse Practitioner

## 2019-11-14 DIAGNOSIS — G40909 Epilepsy, unspecified, not intractable, without status epilepticus: Secondary | ICD-10-CM

## 2019-11-14 DIAGNOSIS — G4700 Insomnia, unspecified: Secondary | ICD-10-CM

## 2019-11-14 NOTE — Telephone Encounter (Signed)
Pharmacy requested refill Pended and sent to Dr. Gupta for approval 

## 2019-11-30 DIAGNOSIS — Z23 Encounter for immunization: Secondary | ICD-10-CM | POA: Diagnosis not present

## 2019-12-16 ENCOUNTER — Other Ambulatory Visit: Payer: Self-pay | Admitting: Internal Medicine

## 2019-12-16 DIAGNOSIS — G4700 Insomnia, unspecified: Secondary | ICD-10-CM

## 2019-12-16 DIAGNOSIS — G40909 Epilepsy, unspecified, not intractable, without status epilepticus: Secondary | ICD-10-CM

## 2019-12-27 DIAGNOSIS — Z23 Encounter for immunization: Secondary | ICD-10-CM | POA: Diagnosis not present

## 2019-12-28 ENCOUNTER — Other Ambulatory Visit: Payer: Self-pay | Admitting: Nurse Practitioner

## 2020-01-10 ENCOUNTER — Other Ambulatory Visit: Payer: Self-pay | Admitting: Nurse Practitioner

## 2020-01-10 DIAGNOSIS — G40909 Epilepsy, unspecified, not intractable, without status epilepticus: Secondary | ICD-10-CM

## 2020-01-19 ENCOUNTER — Other Ambulatory Visit: Payer: Self-pay | Admitting: Nurse Practitioner

## 2020-01-19 DIAGNOSIS — G40909 Epilepsy, unspecified, not intractable, without status epilepticus: Secondary | ICD-10-CM

## 2020-01-19 DIAGNOSIS — G4700 Insomnia, unspecified: Secondary | ICD-10-CM

## 2020-02-15 ENCOUNTER — Telehealth: Payer: Self-pay | Admitting: *Deleted

## 2020-02-15 NOTE — Telephone Encounter (Signed)
Received Prior Authorization Request from [facility] for patient's Tegretol. Initiated through [employer].  Medication APPROVED 01/16/2020-02/14/2021  Key: [id number] Case ID: [id number]

## 2020-02-21 ENCOUNTER — Other Ambulatory Visit: Payer: Self-pay | Admitting: Nurse Practitioner

## 2020-02-21 DIAGNOSIS — G4700 Insomnia, unspecified: Secondary | ICD-10-CM

## 2020-02-21 DIAGNOSIS — G40909 Epilepsy, unspecified, not intractable, without status epilepticus: Secondary | ICD-10-CM

## 2020-02-21 NOTE — Telephone Encounter (Signed)
Patient has request refill on medication "Clonazepam 0.5mg ". Patient last refill was 01/19/2020 with 45 tablets to be taken 1 &1/2 at bedtime as needed. Patient is due for refill. Medication pend and sent to PCP Mast, Man X, NP . Please Advise.

## 2020-03-28 ENCOUNTER — Other Ambulatory Visit: Payer: Self-pay | Admitting: Nurse Practitioner

## 2020-03-28 DIAGNOSIS — G4700 Insomnia, unspecified: Secondary | ICD-10-CM

## 2020-03-28 DIAGNOSIS — G40909 Epilepsy, unspecified, not intractable, without status epilepticus: Secondary | ICD-10-CM

## 2020-03-28 NOTE — Telephone Encounter (Signed)
Patient has request refill on medication "Clonazepam 0.5 mg". Medication pend and sent to PCP Mast, Man X, NP for approval. Please Advise.

## 2020-04-19 ENCOUNTER — Other Ambulatory Visit: Payer: Self-pay

## 2020-04-19 DIAGNOSIS — E785 Hyperlipidemia, unspecified: Secondary | ICD-10-CM

## 2020-04-19 DIAGNOSIS — R7303 Prediabetes: Secondary | ICD-10-CM | POA: Diagnosis not present

## 2020-04-20 LAB — LIPID PANEL
Cholesterol: 161 mg/dL (ref <200)
HDL: 64 mg/dL (ref >=50)
LDL Cholesterol (Calc): 78 mg/dL (calc)
Non-HDL Cholesterol (Calc): 97 mg/dL (calc) (ref <130)
Total CHOL/HDL Ratio: 2.5 (calc) (ref <5.0)
Triglycerides: 104 mg/dL (ref <150)

## 2020-04-20 LAB — MICROALBUMIN / CREATININE URINE RATIO
Creatinine, Urine: 105 mg/dL (ref 20–275)
Microalb Creat Ratio: 10 mcg/mg creat (ref <30)
Microalb, Ur: 1.1 mg/dL

## 2020-04-20 LAB — HEMOGLOBIN A1C
Hgb A1c MFr Bld: 5.9 % of total Hgb — ABNORMAL HIGH (ref <5.7)
Mean Plasma Glucose: 123 mg/dL
eAG (mmol/L): 6.8 mmol/L

## 2020-04-26 ENCOUNTER — Encounter: Payer: Medicare Other | Admitting: Nurse Practitioner

## 2020-05-01 ENCOUNTER — Other Ambulatory Visit: Payer: Self-pay | Admitting: Nurse Practitioner

## 2020-05-01 DIAGNOSIS — G40909 Epilepsy, unspecified, not intractable, without status epilepticus: Secondary | ICD-10-CM

## 2020-05-01 DIAGNOSIS — G4700 Insomnia, unspecified: Secondary | ICD-10-CM

## 2020-05-02 NOTE — Telephone Encounter (Signed)
Pharmacy requested refill Epic LR: 03/28/20 Pended Rx and sent to [facility] for approval.

## 2020-05-10 DIAGNOSIS — H35722 Serous detachment of retinal pigment epithelium, left eye: Secondary | ICD-10-CM | POA: Diagnosis not present

## 2020-05-10 DIAGNOSIS — H40013 Open angle with borderline findings, low risk, bilateral: Secondary | ICD-10-CM | POA: Diagnosis not present

## 2020-05-10 DIAGNOSIS — Z961 Presence of intraocular lens: Secondary | ICD-10-CM | POA: Diagnosis not present

## 2020-05-10 DIAGNOSIS — H353132 Nonexudative age-related macular degeneration, bilateral, intermediate dry stage: Secondary | ICD-10-CM | POA: Diagnosis not present

## 2020-05-10 DIAGNOSIS — H35033 Hypertensive retinopathy, bilateral: Secondary | ICD-10-CM | POA: Diagnosis not present

## 2020-05-17 ENCOUNTER — Encounter: Payer: Self-pay | Admitting: Nurse Practitioner

## 2020-05-17 ENCOUNTER — Non-Acute Institutional Stay: Payer: Medicare Other | Admitting: Nurse Practitioner

## 2020-05-17 ENCOUNTER — Other Ambulatory Visit: Payer: Self-pay

## 2020-05-17 DIAGNOSIS — M810 Age-related osteoporosis without current pathological fracture: Secondary | ICD-10-CM | POA: Diagnosis not present

## 2020-05-17 DIAGNOSIS — R1013 Epigastric pain: Secondary | ICD-10-CM

## 2020-05-17 DIAGNOSIS — G8929 Other chronic pain: Secondary | ICD-10-CM

## 2020-05-17 DIAGNOSIS — G4700 Insomnia, unspecified: Secondary | ICD-10-CM

## 2020-05-17 DIAGNOSIS — E785 Hyperlipidemia, unspecified: Secondary | ICD-10-CM | POA: Diagnosis not present

## 2020-05-17 DIAGNOSIS — R7303 Prediabetes: Secondary | ICD-10-CM

## 2020-05-17 DIAGNOSIS — K5904 Chronic idiopathic constipation: Secondary | ICD-10-CM | POA: Diagnosis not present

## 2020-05-17 DIAGNOSIS — G40822 Epileptic spasms, not intractable, without status epilepticus: Secondary | ICD-10-CM

## 2020-05-17 NOTE — Assessment & Plan Note (Signed)
stable, on Golytely prn, Gavilyte prn.  

## 2020-05-17 NOTE — Assessment & Plan Note (Signed)
Hgb a1c 5.9 04/19/20

## 2020-05-17 NOTE — Assessment & Plan Note (Signed)
LDL 78 04/19/20,  takes Atorvastatin 5mg  qd

## 2020-05-17 NOTE — Progress Notes (Signed)
Location:   clinic [place]   Place of Service:  Clinic (12) Provider: Marlana Latus NP  Code Status: DNR Goals of Care: [place] 07/29/2018  Does Patient Have a Medical Advance Directive? Yes  Type of Advance Directive Living will;Healthcare Power of Attorney  Does patient want to make changes to medical advance directive? No - Patient declined  Copy of [place] in Chart? Yes - validated most recent copy scanned in chart (See row information)     Chief Complaint  Patient presents with  . Medical Management of Chronic Issues    Patient returns to the clinic for follow up. She is recently getting over a sore throat.     HPI: Patient is a 80 y.o. female seen today for medical management of chronic diseases.      Seizures, no active seizures since last seen, on Tegretol 276m qd, Clonazepam 0.567mprn hs.   Constipation, stable, on Golytely prn, Gavilyte prn.             Hyperlipidemia, LDL 78 04/19/20,  takes Atorvastatin 58m71md              Prediabetic, Hgb a1c 5.9 04/19/20  OP takes Reclast, due to DEXA  Insomnia, takes Clonazepam.   Erosive gastropathy, not taking PPI   Past Medical History:  Diagnosis Date  . Anxiety   . Cataract   . Chronic constipation   . Depression   . Osteoporosis   . [place] ([place]  epilepsy  last seizure 1977    Past Surgical History:  Procedure Laterality Date  . ABDOMINAL HYSTERECTOMY  1995   Dr. VanEdwyna Shell CATARACT EXTRACTION Bilateral 12/2016  . COLONOSCOPY  2012   patchy increased intraepithelial lymphocytes - draelos  . ESOPHAGOGASTRODUODENOSCOPY     multiple  . FISSURECTOMY    . WRIST SURGERY Left    due to fracture    Allergies  Allergen Reactions  . Cat Hair Extract Other (See Comments)    Patient states that it causes her eyes to swell shut  . Cefaclor Other (See Comments)    unknown  . Codeine Nausea Only  . Iodinated Diagnostic Agents Other (See Comments)    Shrimp  caused stomach pain and nausea Per patient can eat fried oysters okay    . Other Nausea Only    Unknown allergy Pain medications, Pt states all kinds of pain medications she is unable to take except tylenol  . Oxycodone-Acetaminophen Nausea And Vomiting  . Penicillins Itching    Allergies as of 05/17/2020      Reactions   Cat Hair Extract Other (See Comments)   Patient states that it causes her eyes to swell shut   Cefaclor Other (See Comments)   unknown   Codeine Nausea Only   Iodinated Diagnostic Agents Other (See Comments)   Shrimp caused stomach pain and nausea Per patient can eat fried oysters okay   Other Nausea Only   Unknown allergy Pain medications, Pt states all kinds of pain medications she is unable to take except tylenol   Oxycodone-acetaminophen Nausea And Vomiting   Penicillins Itching      Medication List       Accurate as of May 17, 2020 11:59 PM. If you have any questions, ask your nurse or doctor.        atorvastatin 10 MG tablet Commonly known as: LIPITOR Take 0.5 tablets (5 mg total) by mouth daily.   Calcium 600-400  MG-UNIT Chew Chew by mouth 2 (two) times a day.   clonazePAM 0.5 MG tablet Commonly known as: KLONOPIN TAKE 1&1/2 TABLETS AT BEDTIME AS NEEDED FOR SLEEP.   Golytely 236 g solution Generic drug: polyethylene glycol MIX WITH WATER AND DRINK 3-3 1/2 OUNCES DAILY AS DIRECTED.   PRESERVISION AREDS PO Take 2 tablets by mouth.   TEGretol 200 MG tablet Generic drug: carbamazepine TAKE 1&1/2 TABLETS ONCE DAILY.   Vitamin D (Ergocalciferol) 1.25 MG (50000 UNIT) Caps capsule Commonly known as: DRISDOL TAKE 1 CAPSULE WEEKLY.       Review of Systems:  Review of Systems  Constitutional: Negative for appetite change, fatigue and fever.       #4Ibs weight gained in the past 6 months.   HENT: Positive for hearing loss. Negative for congestion and voice change.   Eyes: Negative for visual disturbance.  Respiratory: Negative for  shortness of breath.   Cardiovascular: Negative for leg swelling.  Gastrointestinal: Negative for abdominal distention, abdominal pain and constipation.  Genitourinary: Positive for frequency. Negative for difficulty urinating, dysuria and urgency.  Musculoskeletal: Positive for arthralgias and gait problem.       Mid back pain. Left 5th toe fx a year ago, uses boot for pain relief. Left hip/leg pain  Skin: Negative for color change.  Neurological: Positive for numbness. Negative for dizziness, seizures and weakness.       Tingling/numbness left foot sometimes. Occasionally left hip/leg pain sometimes.   Psychiatric/Behavioral: Positive for dysphoric mood and sleep disturbance. Negative for behavioral problems. The patient is not nervous/anxious.     Health Maintenance  Topic Date Due  . Hepatitis C Screening  Never done  . FOOT EXAM  Never done  . OPHTHALMOLOGY EXAM  Never done  . MAMMOGRAM  08/13/2017  . COVID-19 Vaccine (3 - Booster for Moderna series) 09/18/2019  . INFLUENZA VACCINE  09/17/2020  . HEMOGLOBIN A1C  10/20/2020  . URINE MICROALBUMIN  04/19/2021  . TETANUS/TDAP  08/21/2027  . DEXA SCAN  Completed  . PNA vac Low Risk Adult  Completed  . HPV VACCINES  Aged Out    Physical Exam: Vitals:   05/17/20 1328  BP: (!) 108/56  Pulse: 85  Temp: 98 F (36.7 C)  SpO2: 96%  Weight: 109 lb 3.2 oz (49.5 kg)  Height: 5' 7" (1.702 m)   Body mass index is 17.1 kg/m. Physical Exam Vitals and nursing note reviewed.  Constitutional:      Appearance: Normal appearance.  HENT:     Head: Normocephalic and atraumatic.  Eyes:     Extraocular Movements: Extraocular movements intact.     Conjunctiva/sclera: Conjunctivae normal.     Pupils: Pupils are equal, round, and reactive to light.  Cardiovascular:     Rate and Rhythm: Normal rate and regular rhythm.     Heart sounds: No murmur heard.   Pulmonary:     Effort: Pulmonary effort is normal.     Breath sounds: No rales.   Abdominal:     General: Bowel sounds are normal.     Palpations: Abdomen is soft.     Tenderness: There is no abdominal tenderness.  Musculoskeletal:     Cervical back: Normal range of motion and neck supple.     Right lower leg: No edema.     Left lower leg: No edema.  Skin:    General: Skin is warm and dry.  Neurological:     General: No focal deficit present.  Mental Status: She is alert and oriented to person, place, and time. Mental status is at baseline.     Gait: Gait normal.  Psychiatric:        Mood and Affect: Mood normal.        Behavior: Behavior normal.        Thought Content: Thought content normal.     Comments: Appears anxious, obsessed with her weight     Labs reviewed: Basic Metabolic Panel: Recent Labs    06/08/19 0928 09/26/19 1427  NA 144 142  K 3.9 3.7  CL 105 105  CO2 27 31  GLUCOSE 100* 99  BUN 19 22  CREATININE 0.75 0.74  CALCIUM 9.7 9.2  TSH 3.09  --    Liver Function Tests: Recent Labs    06/08/19 0928 09/26/19 1427  AST 26 18  ALT 25 17  BILITOT 0.6 0.5  PROT 6.4 6.0*   No results for input(s): LIPASE, AMYLASE in the last 8760 hours. No results for input(s): AMMONIA in the last 8760 hours. CBC: Recent Labs    06/08/19 0928  WBC 4.7  NEUTROABS 2,641  HGB 14.5  HCT 43.5  MCV 97.8  PLT 184   Lipid Panel: Recent Labs    09/26/19 1427 04/19/20 0735  CHOL 158 161  HDL 59 64  LDLCALC 77 78  TRIG 132 104  CHOLHDL 2.7 2.5   Lab Results  Component Value Date   HGBA1C 5.9 (H) 04/19/2020    Procedures since last visit: No results found.  Assessment/Plan  Hyperlipidemia  LDL 78 04/19/20,  takes Atorvastatin 3m qd    Pre-diabetes Hgb a1c 5.9 04/19/20   Constipation stable, on Golytely prn, Gavilyte prn.   Epilepsy without status epilepticus, not intractable [place] no active seizures since last seen, on Tegretol 2071mqd, Clonazepam 0.45m60mrn hs.    Abdominal pain, chronic, epigastric Erosive  gastropathy, not taking PPI    Insomnia Insomnia, takes Clonazepam. Declined sleeping aids.   Osteoporosis Taking Reclast   Labs/tests ordered:  CBC/diff, CMP/eGFR, lipid panel, Hgb a1c.   Next appt:  4 months.

## 2020-05-17 NOTE — Assessment & Plan Note (Signed)
Insomnia, takes Clonazepam. Declined sleeping aids.

## 2020-05-17 NOTE — Assessment & Plan Note (Signed)
Erosive gastropathy, not taking PPI

## 2020-05-17 NOTE — Assessment & Plan Note (Signed)
Taking Reclast

## 2020-05-17 NOTE — Assessment & Plan Note (Signed)
no active seizures since last seen, on Tegretol 200mg  qd, Clonazepam 0.5mg  prn hs.

## 2020-05-18 ENCOUNTER — Encounter: Payer: Self-pay | Admitting: Nurse Practitioner

## 2020-06-05 ENCOUNTER — Other Ambulatory Visit: Payer: Self-pay | Admitting: Nurse Practitioner

## 2020-06-05 DIAGNOSIS — G4700 Insomnia, unspecified: Secondary | ICD-10-CM

## 2020-06-05 DIAGNOSIS — G40909 Epilepsy, unspecified, not intractable, without status epilepticus: Secondary | ICD-10-CM

## 2020-06-26 DIAGNOSIS — Z23 Encounter for immunization: Secondary | ICD-10-CM | POA: Diagnosis not present

## 2020-07-02 ENCOUNTER — Other Ambulatory Visit: Payer: Self-pay

## 2020-07-02 MED ORDER — NIRMATRELVIR/RITONAVIR (PAXLOVID)TABLET: 1.0000 | ORAL_TABLET | Freq: Two times a day (BID) | ORAL | 0 refills | Status: AC

## 2020-07-02 NOTE — Telephone Encounter (Signed)
Patient states that she had 2nd Covid Booster less than one week ago. Patient states that she's been felling feverish,cough, and runny nose  like she has a cold. Patient states that she took Covid Test Sunday 07/01/2020 night and tested Positive. Patient wants to know what medicine she should take to help relieve some of her symptoms? Message routed to PCP Mast, Man X, NP . Please Advise.

## 2020-07-02 NOTE — Telephone Encounter (Signed)
Patient called and notified. Medication pend and routed back to PCP Mast, Man X, NP for further insuccations on medication.

## 2020-07-05 ENCOUNTER — Encounter: Payer: Medicare Other | Admitting: Nurse Practitioner

## 2020-07-06 ENCOUNTER — Other Ambulatory Visit: Payer: Self-pay | Admitting: Nurse Practitioner

## 2020-07-06 DIAGNOSIS — G40909 Epilepsy, unspecified, not intractable, without status epilepticus: Secondary | ICD-10-CM

## 2020-07-06 DIAGNOSIS — G4700 Insomnia, unspecified: Secondary | ICD-10-CM

## 2020-07-06 NOTE — Telephone Encounter (Signed)
Patient has request refill on medications "Clonazepam" and "Golytely". Medications pend and sent to PCP Mast, Man X, NP . Please Advise.

## 2020-07-17 ENCOUNTER — Other Ambulatory Visit: Payer: Self-pay | Admitting: Nurse Practitioner

## 2020-07-17 DIAGNOSIS — G40909 Epilepsy, unspecified, not intractable, without status epilepticus: Secondary | ICD-10-CM

## 2020-08-08 DIAGNOSIS — Z8616 Personal history of COVID-19: Secondary | ICD-10-CM | POA: Diagnosis not present

## 2020-08-08 DIAGNOSIS — M6281 Muscle weakness (generalized): Secondary | ICD-10-CM | POA: Diagnosis not present

## 2020-08-08 DIAGNOSIS — R2681 Unsteadiness on feet: Secondary | ICD-10-CM | POA: Diagnosis not present

## 2020-08-08 DIAGNOSIS — N3946 Mixed incontinence: Secondary | ICD-10-CM | POA: Diagnosis not present

## 2020-08-10 DIAGNOSIS — N3946 Mixed incontinence: Secondary | ICD-10-CM | POA: Diagnosis not present

## 2020-08-10 DIAGNOSIS — R2681 Unsteadiness on feet: Secondary | ICD-10-CM | POA: Diagnosis not present

## 2020-08-10 DIAGNOSIS — M6281 Muscle weakness (generalized): Secondary | ICD-10-CM | POA: Diagnosis not present

## 2020-08-10 DIAGNOSIS — Z8616 Personal history of COVID-19: Secondary | ICD-10-CM | POA: Diagnosis not present

## 2020-08-13 ENCOUNTER — Other Ambulatory Visit: Payer: Self-pay | Admitting: Nurse Practitioner

## 2020-08-13 DIAGNOSIS — E785 Hyperlipidemia, unspecified: Secondary | ICD-10-CM

## 2020-08-13 DIAGNOSIS — R2681 Unsteadiness on feet: Secondary | ICD-10-CM | POA: Diagnosis not present

## 2020-08-13 DIAGNOSIS — G40909 Epilepsy, unspecified, not intractable, without status epilepticus: Secondary | ICD-10-CM

## 2020-08-13 DIAGNOSIS — M6281 Muscle weakness (generalized): Secondary | ICD-10-CM | POA: Diagnosis not present

## 2020-08-13 DIAGNOSIS — G4700 Insomnia, unspecified: Secondary | ICD-10-CM

## 2020-08-13 DIAGNOSIS — Z8616 Personal history of COVID-19: Secondary | ICD-10-CM | POA: Diagnosis not present

## 2020-08-13 DIAGNOSIS — N3946 Mixed incontinence: Secondary | ICD-10-CM | POA: Diagnosis not present

## 2020-08-13 NOTE — Telephone Encounter (Signed)
RX last filled on 07/06/20 and treatment agreement on file from September 2021

## 2020-08-15 DIAGNOSIS — Z8616 Personal history of COVID-19: Secondary | ICD-10-CM | POA: Diagnosis not present

## 2020-08-15 DIAGNOSIS — R7303 Prediabetes: Secondary | ICD-10-CM | POA: Diagnosis not present

## 2020-08-15 DIAGNOSIS — R2681 Unsteadiness on feet: Secondary | ICD-10-CM | POA: Diagnosis not present

## 2020-08-15 DIAGNOSIS — E785 Hyperlipidemia, unspecified: Secondary | ICD-10-CM | POA: Diagnosis not present

## 2020-08-15 DIAGNOSIS — M6281 Muscle weakness (generalized): Secondary | ICD-10-CM | POA: Diagnosis not present

## 2020-08-15 DIAGNOSIS — N3946 Mixed incontinence: Secondary | ICD-10-CM | POA: Diagnosis not present

## 2020-08-16 ENCOUNTER — Other Ambulatory Visit: Payer: Self-pay

## 2020-08-16 DIAGNOSIS — R7303 Prediabetes: Secondary | ICD-10-CM

## 2020-08-16 DIAGNOSIS — E785 Hyperlipidemia, unspecified: Secondary | ICD-10-CM

## 2020-08-17 DIAGNOSIS — N3946 Mixed incontinence: Secondary | ICD-10-CM | POA: Diagnosis not present

## 2020-08-17 DIAGNOSIS — Z8616 Personal history of COVID-19: Secondary | ICD-10-CM | POA: Diagnosis not present

## 2020-08-17 DIAGNOSIS — M6281 Muscle weakness (generalized): Secondary | ICD-10-CM | POA: Diagnosis not present

## 2020-08-17 DIAGNOSIS — R2681 Unsteadiness on feet: Secondary | ICD-10-CM | POA: Diagnosis not present

## 2020-08-17 LAB — COMPLETE METABOLIC PANEL WITH GFR
AG Ratio: 1.8 (calc) (ref 1.0–2.5)
ALT: 16 U/L (ref 6–29)
AST: 18 U/L (ref 10–35)
Albumin: 4 g/dL (ref 3.6–5.1)
Alkaline phosphatase (APISO): 55 U/L (ref 37–153)
BUN: 16 mg/dL (ref 7–25)
CO2: 30 mmol/L (ref 20–32)
Calcium: 9.2 mg/dL (ref 8.6–10.4)
Chloride: 100 mmol/L (ref 98–110)
Creat: 0.61 mg/dL (ref 0.60–0.88)
GFR, Est African American: 99 mL/min/{1.73_m2} (ref >=60)
GFR, Est Non African American: 86 mL/min/{1.73_m2} (ref >=60)
Globulin: 2.2 g/dL (calc) (ref 1.9–3.7)
Glucose, Bld: 100 mg/dL — ABNORMAL HIGH (ref 65–99)
Potassium: 4.1 mmol/L (ref 3.5–5.3)
Sodium: 138 mmol/L (ref 135–146)
Total Bilirubin: 0.5 mg/dL (ref 0.2–1.2)
Total Protein: 6.2 g/dL (ref 6.1–8.1)

## 2020-08-17 LAB — CBC WITH DIFFERENTIAL/PLATELET
Absolute Monocytes: 778 cells/uL (ref 200–950)
Basophils Absolute: 70 cells/uL (ref 0–200)
Basophils Relative: 1.3 %
Eosinophils Absolute: 113 cells/uL (ref 15–500)
Eosinophils Relative: 2.1 %
HCT: 42.2 % (ref 35.0–45.0)
Hemoglobin: 14 g/dL (ref 11.7–15.5)
Lymphs Abs: 1253 cells/uL (ref 850–3900)
MCH: 31.7 pg (ref 27.0–33.0)
MCHC: 33.2 g/dL (ref 32.0–36.0)
MCV: 95.5 fL (ref 80.0–100.0)
MPV: 10.3 fL (ref 7.5–12.5)
Monocytes Relative: 14.4 %
Neutro Abs: 3186 cells/uL (ref 1500–7800)
Neutrophils Relative %: 59 %
Platelets: 211 10*3/uL (ref 140–400)
RBC: 4.42 10*6/uL (ref 3.80–5.10)
RDW: 12.6 % (ref 11.0–15.0)
Total Lymphocyte: 23.2 %
WBC: 5.4 10*3/uL (ref 3.8–10.8)

## 2020-08-17 LAB — LIPID PANEL
Cholesterol: 153 mg/dL (ref <200)
HDL: 48 mg/dL — ABNORMAL LOW (ref >=50)
LDL Cholesterol (Calc): 79 mg/dL (calc)
Non-HDL Cholesterol (Calc): 105 mg/dL (calc) (ref <130)
Total CHOL/HDL Ratio: 3.2 (calc) (ref <5.0)
Triglycerides: 159 mg/dL — ABNORMAL HIGH (ref <150)

## 2020-08-17 LAB — HEMOGLOBIN A1C
Hgb A1c MFr Bld: 6 % of total Hgb — ABNORMAL HIGH (ref <5.7)
Mean Plasma Glucose: 126 mg/dL
eAG (mmol/L): 7 mmol/L

## 2020-08-20 DIAGNOSIS — N3946 Mixed incontinence: Secondary | ICD-10-CM | POA: Diagnosis not present

## 2020-08-20 DIAGNOSIS — Z8616 Personal history of COVID-19: Secondary | ICD-10-CM | POA: Diagnosis not present

## 2020-08-20 DIAGNOSIS — R2681 Unsteadiness on feet: Secondary | ICD-10-CM | POA: Diagnosis not present

## 2020-08-20 DIAGNOSIS — M6281 Muscle weakness (generalized): Secondary | ICD-10-CM | POA: Diagnosis not present

## 2020-08-22 ENCOUNTER — Non-Acute Institutional Stay (INDEPENDENT_AMBULATORY_CARE_PROVIDER_SITE_OTHER): Payer: Medicare Other | Admitting: Family Medicine

## 2020-08-22 ENCOUNTER — Encounter: Payer: Self-pay | Admitting: Family Medicine

## 2020-08-22 ENCOUNTER — Other Ambulatory Visit: Payer: Self-pay

## 2020-08-22 VITALS — BP 118/68 | HR 88 | Temp 97.0°F | Ht 67.0 in | Wt 106.0 lb

## 2020-08-22 DIAGNOSIS — Z8616 Personal history of COVID-19: Secondary | ICD-10-CM | POA: Diagnosis not present

## 2020-08-22 DIAGNOSIS — G4700 Insomnia, unspecified: Secondary | ICD-10-CM | POA: Diagnosis not present

## 2020-08-22 DIAGNOSIS — N3946 Mixed incontinence: Secondary | ICD-10-CM | POA: Diagnosis not present

## 2020-08-22 DIAGNOSIS — M6281 Muscle weakness (generalized): Secondary | ICD-10-CM | POA: Diagnosis not present

## 2020-08-22 DIAGNOSIS — G40822 Epileptic spasms, not intractable, without status epilepticus: Secondary | ICD-10-CM | POA: Diagnosis not present

## 2020-08-22 DIAGNOSIS — R7303 Prediabetes: Secondary | ICD-10-CM

## 2020-08-22 DIAGNOSIS — R2681 Unsteadiness on feet: Secondary | ICD-10-CM | POA: Diagnosis not present

## 2020-08-22 NOTE — Progress Notes (Signed)
Provider:  Jacalyn Lefevre, MD  Careteam: Patient Care Team: Mast, Man X, NP as PCP - General (Internal Medicine) Mast, Man X, NP as Nurse Practitioner (Internal Medicine)  PLACE OF SERVICE:  [facility]  Advanced Directive information    Allergies  Allergen Reactions   Cat Hair Extract Other (See Comments)    Patient states that it causes her eyes to swell shut   Cefaclor Other (See Comments)    unknown   Codeine Nausea Only   Iodinated Diagnostic Agents Other (See Comments)    Shrimp caused stomach pain and nausea Per patient can eat fried oysters okay     Other Nausea Only    Unknown allergy Pain medications, Pt states all kinds of pain medications she is unable to take except tylenol   Oxycodone-Acetaminophen Nausea And Vomiting   Penicillins Itching    Chief Complaint  Patient presents with   Medical Management of Chronic Issues    Patient presents today to the clinic for a 3 month HLD and insomnia follow-up.      HPI: Patient is a 80 y.o. female here for 18-month follow-up to discuss lipids and insomnia.  She had recent lab work including lipid panel A1c microalbumin renal function.  LDL is at goal on atorvastatin.  She denies side effects. She is very concerned about her weight and would like to gain weight.  We spent some time discussing her prediabetes and weight gain.  Tried to impress upon her that her weight is healthier perhaps not for her bones but for her sugar levels.  We did recommend Ensure as a supplement rather than the protein drinks that she brought with her today. We also spent a fair amount of time talking about sleep medication.  She takes clonazepam but that has not been effective recently as she has tried to cut down on the dose because she is concerned about this being a controlled substance.  She also shows me a bottle of melatonin but she was afraid to take melatonin and clonazepam together.  Review of Systems:  Review of Systems   Psychiatric/Behavioral:  The patient has insomnia.   All other systems reviewed and are negative.  Past Medical History:  Diagnosis Date   Anxiety    Cataract    Chronic constipation    Depression    Osteoporosis    Seizures ([name])    epilepsy  last seizure 1977   Past Surgical History:  Procedure Laterality Date   ABDOMINAL HYSTERECTOMY  1995   Dr. Carlis Abbott   CATARACT EXTRACTION Bilateral 12/2016   COLONOSCOPY  2012   patchy increased intraepithelial lymphocytes - draelos   ESOPHAGOGASTRODUODENOSCOPY     multiple   FISSURECTOMY     WRIST SURGERY Left    due to fracture   Social History:   reports that she quit smoking about 39 years ago. Her smoking use included cigarettes. She started smoking about 64 years ago. She smoked an average of 1.00 packs per day. She has never used smokeless tobacco. She reports that she does not drink alcohol and does not use drugs.  Family History  Problem Relation Age of Onset   Alcohol abuse Father    Diabetes Father    Diabetes Maternal Grandmother    Diabetes Paternal Grandmother    Colon cancer Maternal Grandfather    Esophageal cancer Neg Hx    Rectal cancer Neg Hx    Stomach cancer Neg Hx     Medications: Patient's  Medications  New Prescriptions   No medications on file  Previous Medications   ATORVASTATIN (LIPITOR) 10 MG TABLET    TAKE (1/2) TABLET DAILY.   CALCIUM 600-400 MG-UNIT CHEW    Chew by mouth 2 (two) times a day.    CLONAZEPAM (KLONOPIN) 0.5 MG TABLET    TAKE 1 AND 1/2 TABLETS BY MOUTH AT BEDTIME AS NEEDED SLEEP   GOLYTELY 236 G SOLUTION    MIX WITH WATER AND DRINK 3-3 1/2 OUNCES DAILY AS DIRECTED.   MULTIPLE VITAMINS-MINERALS (PRESERVISION AREDS PO)    Take 2 tablets by mouth.   TEGRETOL 200 MG TABLET    TAKE 1&1/2 TABLETS ONCE DAILY.   VITAMIN D, ERGOCALCIFEROL, (DRISDOL) 1.25 MG (50000 UNIT) CAPS CAPSULE    TAKE 1 CAPSULE WEEKLY.  Modified Medications   No medications on file  Discontinued Medications    No medications on file    Physical Exam:  There were no vitals filed for this visit. There is no height or weight on file to calculate BMI. Wt Readings from Last 3 Encounters:  05/17/20 109 lb 3.2 oz (49.5 kg)  10/21/19 105 lb 6.4 oz (47.8 kg)  10/11/19 105 lb 8 oz (47.9 kg)    Physical Exam Vitals and nursing note reviewed.  Constitutional:      Appearance: Normal appearance.  Cardiovascular:     Rate and Rhythm: Normal rate and regular rhythm.  Pulmonary:     Effort: Pulmonary effort is normal.     Breath sounds: Normal breath sounds.  Neurological:     General: No focal deficit present.     Mental Status: She is alert and oriented to person, place, and time.    Labs reviewed: Basic Metabolic Panel: Recent Labs    09/26/19 1427 08/15/20 0813  NA 142 138  K 3.7 4.1  CL 105 100  CO2 31 30  GLUCOSE 99 100*  BUN 22 16  CREATININE 0.74 0.61  CALCIUM 9.2 9.2   Liver Function Tests: Recent Labs    09/26/19 1427 08/15/20 0813  AST 18 18  ALT 17 16  BILITOT 0.5 0.5  PROT 6.0* 6.2   No results for input(s): LIPASE, AMYLASE in the last 8760 hours. No results for input(s): AMMONIA in the last 8760 hours. CBC: Recent Labs    08/15/20 0813  WBC 5.4  NEUTROABS 3,186  HGB 14.0  HCT 42.2  MCV 95.5  PLT 211   Lipid Panel: Recent Labs    09/26/19 1427 04/19/20 0735 08/15/20 0813  CHOL 158 161 153  HDL 59 64 48*  LDLCALC 77 78 79  TRIG 132 104 159*  CHOLHDL 2.7 2.5 3.2   TSH: No results for input(s): TSH in the last 8760 hours. A1C: Lab Results  Component Value Date   HGBA1C 6.0 (H) 08/15/2020     Assessment/Plan  1. Nonintractable epileptic spasms without status epilepticus ([name]) No recent seizures continue Tylenol at same dose  2. Insomnia, unspecified type We will try to taper the clonazepam.  I do not think this is really preventing seizures since she is already on Tegretol.  Clonazepam may be useful as far as treating as ongoing seizure  but as far as preventive that is not usual use.  So we will taper the clonazepam over 2-week time and substitute melatonin for clonazepam.  I have written her out a schedule to do same.  We will see her back in 2 weeks to check on progress.  She seems very  anxious about going off of the clonazepam.  I did tell her she could take 1 as needed if she related needed a good night sleep and the melatonin was not effective.  3. Pre-diabetes A1c still slightly elevated at 6.0.  We will continue to monitor.  Told her I think her sugar levels are more important than her weight at this point and would not like to see her progress to out right diabetes  Jacalyn Lefevre, MD [facility] [phone]

## 2020-08-24 DIAGNOSIS — R2681 Unsteadiness on feet: Secondary | ICD-10-CM | POA: Diagnosis not present

## 2020-08-24 DIAGNOSIS — M6281 Muscle weakness (generalized): Secondary | ICD-10-CM | POA: Diagnosis not present

## 2020-08-24 DIAGNOSIS — Z8616 Personal history of COVID-19: Secondary | ICD-10-CM | POA: Diagnosis not present

## 2020-08-24 DIAGNOSIS — N3946 Mixed incontinence: Secondary | ICD-10-CM | POA: Diagnosis not present

## 2020-08-27 DIAGNOSIS — M6281 Muscle weakness (generalized): Secondary | ICD-10-CM | POA: Diagnosis not present

## 2020-08-27 DIAGNOSIS — Z8616 Personal history of COVID-19: Secondary | ICD-10-CM | POA: Diagnosis not present

## 2020-08-27 DIAGNOSIS — N3946 Mixed incontinence: Secondary | ICD-10-CM | POA: Diagnosis not present

## 2020-08-27 DIAGNOSIS — R2681 Unsteadiness on feet: Secondary | ICD-10-CM | POA: Diagnosis not present

## 2020-08-29 DIAGNOSIS — N3946 Mixed incontinence: Secondary | ICD-10-CM | POA: Diagnosis not present

## 2020-08-29 DIAGNOSIS — M6281 Muscle weakness (generalized): Secondary | ICD-10-CM | POA: Diagnosis not present

## 2020-08-29 DIAGNOSIS — R2681 Unsteadiness on feet: Secondary | ICD-10-CM | POA: Diagnosis not present

## 2020-08-29 DIAGNOSIS — Z8616 Personal history of COVID-19: Secondary | ICD-10-CM | POA: Diagnosis not present

## 2020-08-31 DIAGNOSIS — M6281 Muscle weakness (generalized): Secondary | ICD-10-CM | POA: Diagnosis not present

## 2020-08-31 DIAGNOSIS — R2681 Unsteadiness on feet: Secondary | ICD-10-CM | POA: Diagnosis not present

## 2020-08-31 DIAGNOSIS — Z8616 Personal history of COVID-19: Secondary | ICD-10-CM | POA: Diagnosis not present

## 2020-08-31 DIAGNOSIS — N3946 Mixed incontinence: Secondary | ICD-10-CM | POA: Diagnosis not present

## 2020-09-03 DIAGNOSIS — Z8616 Personal history of COVID-19: Secondary | ICD-10-CM | POA: Diagnosis not present

## 2020-09-03 DIAGNOSIS — R2681 Unsteadiness on feet: Secondary | ICD-10-CM | POA: Diagnosis not present

## 2020-09-03 DIAGNOSIS — M6281 Muscle weakness (generalized): Secondary | ICD-10-CM | POA: Diagnosis not present

## 2020-09-03 DIAGNOSIS — N3946 Mixed incontinence: Secondary | ICD-10-CM | POA: Diagnosis not present

## 2020-09-05 DIAGNOSIS — Z8616 Personal history of COVID-19: Secondary | ICD-10-CM | POA: Diagnosis not present

## 2020-09-05 DIAGNOSIS — M6281 Muscle weakness (generalized): Secondary | ICD-10-CM | POA: Diagnosis not present

## 2020-09-05 DIAGNOSIS — N3946 Mixed incontinence: Secondary | ICD-10-CM | POA: Diagnosis not present

## 2020-09-05 DIAGNOSIS — R2681 Unsteadiness on feet: Secondary | ICD-10-CM | POA: Diagnosis not present

## 2020-09-07 DIAGNOSIS — R2681 Unsteadiness on feet: Secondary | ICD-10-CM | POA: Diagnosis not present

## 2020-09-07 DIAGNOSIS — Z8616 Personal history of COVID-19: Secondary | ICD-10-CM | POA: Diagnosis not present

## 2020-09-07 DIAGNOSIS — N3946 Mixed incontinence: Secondary | ICD-10-CM | POA: Diagnosis not present

## 2020-09-07 DIAGNOSIS — M6281 Muscle weakness (generalized): Secondary | ICD-10-CM | POA: Diagnosis not present

## 2020-09-10 DIAGNOSIS — M6281 Muscle weakness (generalized): Secondary | ICD-10-CM | POA: Diagnosis not present

## 2020-09-10 DIAGNOSIS — Z8616 Personal history of COVID-19: Secondary | ICD-10-CM | POA: Diagnosis not present

## 2020-09-10 DIAGNOSIS — N3946 Mixed incontinence: Secondary | ICD-10-CM | POA: Diagnosis not present

## 2020-09-10 DIAGNOSIS — R2681 Unsteadiness on feet: Secondary | ICD-10-CM | POA: Diagnosis not present

## 2020-09-12 DIAGNOSIS — Z8616 Personal history of COVID-19: Secondary | ICD-10-CM | POA: Diagnosis not present

## 2020-09-12 DIAGNOSIS — M6281 Muscle weakness (generalized): Secondary | ICD-10-CM | POA: Diagnosis not present

## 2020-09-12 DIAGNOSIS — R2681 Unsteadiness on feet: Secondary | ICD-10-CM | POA: Diagnosis not present

## 2020-09-12 DIAGNOSIS — N3946 Mixed incontinence: Secondary | ICD-10-CM | POA: Diagnosis not present

## 2020-09-13 ENCOUNTER — Non-Acute Institutional Stay: Payer: Medicare Other | Admitting: Nurse Practitioner

## 2020-09-13 ENCOUNTER — Other Ambulatory Visit: Payer: Self-pay

## 2020-09-13 ENCOUNTER — Encounter: Payer: Self-pay | Admitting: Nurse Practitioner

## 2020-09-13 DIAGNOSIS — G4700 Insomnia, unspecified: Secondary | ICD-10-CM

## 2020-09-13 DIAGNOSIS — K5904 Chronic idiopathic constipation: Secondary | ICD-10-CM

## 2020-09-13 DIAGNOSIS — K3189 Other diseases of stomach and duodenum: Secondary | ICD-10-CM

## 2020-09-13 DIAGNOSIS — F419 Anxiety disorder, unspecified: Secondary | ICD-10-CM | POA: Diagnosis not present

## 2020-09-13 DIAGNOSIS — G40822 Epileptic spasms, not intractable, without status epilepticus: Secondary | ICD-10-CM | POA: Diagnosis not present

## 2020-09-13 DIAGNOSIS — M810 Age-related osteoporosis without current pathological fracture: Secondary | ICD-10-CM | POA: Diagnosis not present

## 2020-09-13 DIAGNOSIS — E785 Hyperlipidemia, unspecified: Secondary | ICD-10-CM | POA: Diagnosis not present

## 2020-09-13 DIAGNOSIS — R7303 Prediabetes: Secondary | ICD-10-CM

## 2020-09-13 NOTE — Assessment & Plan Note (Signed)
stable, on Golytely prn, Gavilyte prn.

## 2020-09-13 NOTE — Assessment & Plan Note (Signed)
takes Atorvastatin 5mg qd, LDL 79 08/15/20 

## 2020-09-13 NOTE — Assessment & Plan Note (Signed)
not taking PPI, Hgb 14.0 08/15/20, diet control.

## 2020-09-13 NOTE — Assessment & Plan Note (Signed)
Hgb a1c 5.9 04/19/20

## 2020-09-13 NOTE — Assessment & Plan Note (Signed)
OP takes Reclast, due to DEXA

## 2020-09-13 NOTE — Assessment & Plan Note (Signed)
Refused Melatonin, wants to continue Clonazepam, the patient is aware R vs B

## 2020-09-13 NOTE — Assessment & Plan Note (Signed)
Failed GDR or stop Clonazepam

## 2020-09-13 NOTE — Assessment & Plan Note (Signed)
Seizures, no active seizures since last seen, on Tegretol 300mg  qd, Clonazepam 0.5mg  prn hs(GDR to stop on her own didn't cause active seizures, but flare up insomnia and anxiety)

## 2020-09-13 NOTE — Progress Notes (Signed)
Location:   clinic [facility]   Place of Service:  Clinic (12) Provider: Chipper Oman NP  Code Status: DNR Goals of Care: [place] 09/13/2020  Does Patient Have a Medical Advance Directive? Yes  Type of Estate agent of [place];Living will  Does patient want to make changes to medical advance directive? No - Patient declined  Copy of Healthcare Power of Attorney in Chart? Yes - validated most recent copy scanned in chart (See row information)     Chief Complaint  Patient presents with   Medical Management of Chronic Issues    2 week follow up     HPI: Patient is a 80 y.o. female seen today for medical management of chronic diseases.    Seizures, no active seizures since last seen, on Tegretol 300mg  qd, Clonazepam 0.5mg  prn hs(GDR to stop on her own didn't cause active seizures, but flare up insomnia and anxiety)             Constipation, stable, on Golytely prn, Gavilyte prn.              Hyperlipidemia, takes Atorvastatin 5mg  qd, LDL 79 08/15/20             Prediabetic, Hgb a1c 5.9 04/19/20             OP takes Reclast, due to DEXA             Insomnia, takes Clonazepam.             Erosive gastropathy, not taking PPI, Hgb 14.0 08/15/20. Diet controls.     Past Medical History:  Diagnosis Date   Anxiety    Cataract    Chronic constipation    Depression    Osteoporosis    Seizures ([name])    epilepsy  last seizure 1977    Past Surgical History:  Procedure Laterality Date   ABDOMINAL HYSTERECTOMY  1995   Dr. 1978   CATARACT EXTRACTION Bilateral 12/2016   COLONOSCOPY  2012   patchy increased intraepithelial lymphocytes - draelos   ESOPHAGOGASTRODUODENOSCOPY     multiple   FISSURECTOMY     WRIST SURGERY Left    due to fracture    Allergies  Allergen Reactions   Cat Hair Extract Other (See Comments)    Patient states that it causes her eyes to swell shut   Cefaclor Other (See Comments)    unknown   Codeine Nausea Only    Iodinated Diagnostic Agents Other (See Comments)    Shrimp caused stomach pain and nausea Per patient can eat fried oysters okay     Other Nausea Only    Unknown allergy Pain medications, Pt states all kinds of pain medications she is unable to take except tylenol   Oxycodone-Acetaminophen Nausea And Vomiting   Penicillins Itching    Allergies as of 09/13/2020       Reactions   Cat Hair Extract Other (See Comments)   Patient states that it causes her eyes to swell shut   Cefaclor Other (See Comments)   unknown   Codeine Nausea Only   Iodinated Diagnostic Agents Other (See Comments)   Shrimp caused stomach pain and nausea Per patient can eat fried oysters okay   Other Nausea Only   Unknown allergy Pain medications, Pt states all kinds of pain medications she is unable to take except tylenol   Oxycodone-acetaminophen Nausea And Vomiting   Penicillins Itching        Medication List  Accurate as of September 13, 2020 11:59 PM. If you have any questions, ask your nurse or doctor.          atorvastatin 10 MG tablet Commonly known as: LIPITOR TAKE (1/2) TABLET DAILY.   Calcium 600-400 MG-UNIT Chew Chew by mouth 2 (two) times a day.   clonazePAM 0.5 MG tablet Commonly known as: KLONOPIN TAKE 1 AND 1/2 TABLETS BY MOUTH AT BEDTIME AS NEEDED SLEEP   Golytely 236 g solution Generic drug: polyethylene glycol MIX WITH WATER AND DRINK 3-3 1/2 OUNCES DAILY AS DIRECTED.   PRESERVISION AREDS PO Take 2 tablets by mouth.   TEGretol 200 MG tablet Generic drug: carbamazepine TAKE 1&1/2 TABLETS ONCE DAILY.   Vitamin D (Ergocalciferol) 1.25 MG (50000 UNIT) Caps capsule Commonly known as: DRISDOL TAKE 1 CAPSULE WEEKLY.        Review of Systems:  Review of Systems  Constitutional:  Negative for appetite change, fatigue and fever.  HENT:  Positive for hearing loss. Negative for congestion and voice change.   Eyes:  Negative for visual disturbance.  Respiratory:   Negative for shortness of breath.   Cardiovascular:  Negative for leg swelling.  Gastrointestinal:  Negative for abdominal pain and constipation.  Genitourinary:  Positive for frequency. Negative for difficulty urinating, dysuria and urgency.  Musculoskeletal:  Positive for arthralgias and gait problem.       Mid back pain. Left 5th toe fx a year ago, uses boot for pain relief. Left hip/leg pain  Skin:  Negative for color change.  Neurological:  Positive for numbness. Negative for dizziness, seizures and weakness.       Tingling/numbness left foot sometimes. Occasionally left hip/leg pain sometimes.   Psychiatric/Behavioral:  Positive for sleep disturbance. Negative for behavioral problems. The patient is nervous/anxious.    Health Maintenance  Topic Date Due   FOOT EXAM  Never done   OPHTHALMOLOGY EXAM  Never done   MAMMOGRAM  08/13/2017   COVID-19 Vaccine (3 - Moderna risk series) 04/18/2019   INFLUENZA VACCINE  09/17/2020   HEMOGLOBIN A1C  02/14/2021   URINE MICROALBUMIN  04/19/2021   TETANUS/TDAP  08/21/2027   DEXA SCAN  Completed   PNA vac Low Risk Adult  Completed   Zoster Vaccines- Shingrix  Completed   HPV VACCINES  Aged Out    Physical Exam: Vitals:   09/13/20 1327  BP: (!) 124/58  Pulse: 77  Resp: 18  Temp: (!) 96.2 F (35.7 C)  SpO2: 97%  Weight: 103 lb 9.6 oz (47 kg)  Height: 5\' 7"  (1.702 m)   Body mass index is 16.23 kg/m. Physical Exam Vitals and nursing note reviewed.  Constitutional:      Appearance: Normal appearance.  HENT:     Head: Normocephalic and atraumatic.  Eyes:     Extraocular Movements: Extraocular movements intact.     Conjunctiva/sclera: Conjunctivae normal.     Pupils: Pupils are equal, round, and reactive to light.  Cardiovascular:     Rate and Rhythm: Normal rate and regular rhythm.     Heart sounds: No murmur heard. Pulmonary:     Effort: Pulmonary effort is normal.     Breath sounds: No rales.  Abdominal:     General: Bowel  sounds are normal.     Palpations: Abdomen is soft.     Tenderness: There is no abdominal tenderness.  Musculoskeletal:     Cervical back: Normal range of motion and neck supple.     Right lower leg: No  edema.     Left lower leg: No edema.  Skin:    General: Skin is warm and dry.  Neurological:     General: No focal deficit present.     Mental Status: She is alert and oriented to person, place, and time. Mental status is at baseline.     Gait: Gait normal.  Psychiatric:        Mood and Affect: Mood normal.        Behavior: Behavior normal.        Thought Content: Thought content normal.     Comments: Appears anxious, obsessed with her weight    Labs reviewed: Basic Metabolic Panel: Recent Labs    09/26/19 1427 08/15/20 0813  NA 142 138  K 3.7 4.1  CL 105 100  CO2 31 30  GLUCOSE 99 100*  BUN 22 16  CREATININE 0.74 0.61  CALCIUM 9.2 9.2   Liver Function Tests: Recent Labs    09/26/19 1427 08/15/20 0813  AST 18 18  ALT 17 16  BILITOT 0.5 0.5  PROT 6.0* 6.2   No results for input(s): LIPASE, AMYLASE in the last 8760 hours. No results for input(s): AMMONIA in the last 8760 hours. CBC: Recent Labs    08/15/20 0813  WBC 5.4  NEUTROABS 3,186  HGB 14.0  HCT 42.2  MCV 95.5  PLT 211   Lipid Panel: Recent Labs    09/26/19 1427 04/19/20 0735 08/15/20 0813  CHOL 158 161 153  HDL 59 64 48*  LDLCALC 77 78 79  TRIG 132 104 159*  CHOLHDL 2.7 2.5 3.2   Lab Results  Component Value Date   HGBA1C 6.0 (H) 08/15/2020    Procedures since last visit: No results found.  Assessment/Plan  Epilepsy without status epilepticus, not intractable ([name]) Seizures, no active seizures since last seen, on Tegretol 300mg  qd, Clonazepam 0.5mg  prn hs(GDR to stop on her own didn't cause active seizures, but flare up insomnia and anxiety)  Anxiety Failed GDR or stop Clonazepam  Insomnia Refused Melatonin, wants to continue Clonazepam, the patient is aware R vs  B  Constipation stable, on Golytely prn, Gavilyte prn.   Hyperlipidemia takes Atorvastatin 5mg  qd, LDL 79 08/15/20  Pre-diabetes Hgb a1c 5.9 04/19/20  Osteoporosis OP takes Reclast, due to DEXA  Erosive gastropathy not taking PPI, Hgb 14.0 08/15/20, diet control.    Labs/tests ordered:  none  Next appt:  3 months.

## 2020-09-14 DIAGNOSIS — Z8616 Personal history of COVID-19: Secondary | ICD-10-CM | POA: Diagnosis not present

## 2020-09-14 DIAGNOSIS — M6281 Muscle weakness (generalized): Secondary | ICD-10-CM | POA: Diagnosis not present

## 2020-09-14 DIAGNOSIS — N3946 Mixed incontinence: Secondary | ICD-10-CM | POA: Diagnosis not present

## 2020-09-14 DIAGNOSIS — R2681 Unsteadiness on feet: Secondary | ICD-10-CM | POA: Diagnosis not present

## 2020-09-17 ENCOUNTER — Encounter: Payer: Self-pay | Admitting: Nurse Practitioner

## 2020-09-17 DIAGNOSIS — M6281 Muscle weakness (generalized): Secondary | ICD-10-CM | POA: Diagnosis not present

## 2020-09-17 DIAGNOSIS — N3946 Mixed incontinence: Secondary | ICD-10-CM | POA: Diagnosis not present

## 2020-09-17 DIAGNOSIS — Z8616 Personal history of COVID-19: Secondary | ICD-10-CM | POA: Diagnosis not present

## 2020-09-17 DIAGNOSIS — R2681 Unsteadiness on feet: Secondary | ICD-10-CM | POA: Diagnosis not present

## 2020-09-18 ENCOUNTER — Other Ambulatory Visit: Payer: Self-pay | Admitting: Nurse Practitioner

## 2020-09-18 DIAGNOSIS — Z20822 Contact with and (suspected) exposure to covid-19: Secondary | ICD-10-CM | POA: Diagnosis not present

## 2020-09-19 DIAGNOSIS — N3946 Mixed incontinence: Secondary | ICD-10-CM | POA: Diagnosis not present

## 2020-09-19 DIAGNOSIS — Z8616 Personal history of COVID-19: Secondary | ICD-10-CM | POA: Diagnosis not present

## 2020-09-19 DIAGNOSIS — R2681 Unsteadiness on feet: Secondary | ICD-10-CM | POA: Diagnosis not present

## 2020-09-19 DIAGNOSIS — M6281 Muscle weakness (generalized): Secondary | ICD-10-CM | POA: Diagnosis not present

## 2020-09-21 DIAGNOSIS — N3946 Mixed incontinence: Secondary | ICD-10-CM | POA: Diagnosis not present

## 2020-09-21 DIAGNOSIS — Z8616 Personal history of COVID-19: Secondary | ICD-10-CM | POA: Diagnosis not present

## 2020-09-21 DIAGNOSIS — M6281 Muscle weakness (generalized): Secondary | ICD-10-CM | POA: Diagnosis not present

## 2020-09-21 DIAGNOSIS — R2681 Unsteadiness on feet: Secondary | ICD-10-CM | POA: Diagnosis not present

## 2020-09-25 ENCOUNTER — Other Ambulatory Visit: Payer: Self-pay | Admitting: Nurse Practitioner

## 2020-09-25 DIAGNOSIS — G4700 Insomnia, unspecified: Secondary | ICD-10-CM

## 2020-09-25 DIAGNOSIS — G40909 Epilepsy, unspecified, not intractable, without status epilepticus: Secondary | ICD-10-CM

## 2020-09-26 ENCOUNTER — Encounter: Payer: Medicare Other | Admitting: Family Medicine

## 2020-09-27 ENCOUNTER — Encounter: Payer: Medicare Other | Admitting: Nurse Practitioner

## 2020-10-17 ENCOUNTER — Ambulatory Visit: Payer: Medicare Other

## 2020-10-18 ENCOUNTER — Encounter: Payer: Self-pay | Admitting: Nurse Practitioner

## 2020-10-18 ENCOUNTER — Ambulatory Visit (INDEPENDENT_AMBULATORY_CARE_PROVIDER_SITE_OTHER): Payer: Medicare Other | Admitting: Nurse Practitioner

## 2020-10-18 ENCOUNTER — Other Ambulatory Visit: Payer: Self-pay

## 2020-10-18 ENCOUNTER — Telehealth: Payer: Self-pay

## 2020-10-18 DIAGNOSIS — Z0000 Encounter for general adult medical examination without abnormal findings: Secondary | ICD-10-CM

## 2020-10-18 NOTE — Telephone Encounter (Signed)
[name], trentham are scheduled for a virtual visit with your provider today.    Just as we do with appointments in the office, we must obtain your consent to participate.  Your consent will be active for this visit and any virtual visit you may have with one of our providers in the next 365 days.    If you have a MyChart account, I can also send a copy of this consent to you electronically.  All virtual visits are billed to your insurance company just like a traditional visit in the office.  As this is a virtual visit, video technology does not allow for your provider to perform a traditional examination.  This may limit your provider's ability to fully assess your condition.  If your provider identifies any concerns that need to be evaluated in person or the need to arrange testing such as labs, EKG, etc, we will make arrangements to do so.    Although advances in technology are sophisticated, we cannot ensure that it will always work on either your end or our end.  If the connection with a video visit is poor, we may have to switch to a telephone visit.  With either a video or telephone visit, we are not always able to ensure that we have a secure connection.   I need to obtain your verbal consent now.   Are you willing to proceed with your visit today?   Erin Ochoa has provided verbal consent on 10/18/2020 for a virtual visit (video or telephone).   [name], CMA 10/18/2020  1:22 PM

## 2020-10-18 NOTE — Progress Notes (Signed)
This service is provided via telemedicine  No vital signs collected/recorded due to the encounter was a telemedicine visit.   Location of patient (ex: home, work):  Home  Patient consents to a telephone visit:  Yes, see encounter dated 10/18/2020  Location of the provider (ex: office, home):  [facility]  Name of any referring provider:  Mast, Man X, NP  Names of all persons participating in the telemedicine service and their role in the encounter:  Abbey Chatters, Nurse Practitioner, [name], CMA, and patient.   Time spent on call:  10 minutes with medical assistant

## 2020-10-18 NOTE — Progress Notes (Signed)
Subjective:   Erin Ochoa is a 80 y.o. female who presents for Medicare Annual (Subsequent) preventive examination.  Review of Systems     Cardiac Risk Factors include: advanced age (>32men, >24 women);sedentary lifestyle     Objective:    There were no vitals filed for this visit. There is no height or weight on file to calculate BMI.  Advanced Directives 10/18/2020 09/13/2020 07/29/2018 03/13/2017 12/11/2016 10/30/2016  Does Patient Have a Medical Advance Directive? Yes Yes Yes Yes Yes Yes  Type of Estate agent of [place];Living will Healthcare Power of [place];Living will Living will;Healthcare Power of [employer] Power of [place];Living will Healthcare Power of [employer] Power of Attorney  Does patient want to make changes to medical advance directive? No - Patient declined No - Patient declined No - Patient declined No - Patient declined No - Patient declined No - Patient declined  Copy of Healthcare Power of Attorney in Chart? Yes - validated most recent copy scanned in chart (See row information) Yes - validated most recent copy scanned in chart (See row information) Yes - validated most recent copy scanned in chart (See row information) Yes - Yes    Current Medications (verified) Outpatient Encounter Medications as of 10/18/2020  Medication Sig   atorvastatin (LIPITOR) 10 MG tablet TAKE (1/2) TABLET DAILY.   clonazePAM (KLONOPIN) 0.5 MG tablet TAKE 1 AND 1/2 TABLETS BY MOUTH AT BEDTIME AS NEEDED SLEEP (Patient taking differently: No sig reported)   GOLYTELY 236 g solution MIX WITH WATER AND DRINK 3-3 1/2 OUNCES DAILY AS DIRECTED.   Multiple Vitamins-Minerals (PRESERVISION AREDS PO) Take 2 tablets by mouth.   TEGRETOL 200 MG tablet TAKE 1&1/2 TABLETS ONCE DAILY.   Vitamin D, Ergocalciferol, (DRISDOL) 1.25 MG (50000 UNIT) CAPS capsule TAKE 1 CAPSULE WEEKLY.   [DISCONTINUED] Calcium 600-400 MG-UNIT CHEW Chew by mouth 2 (two) times a day.     No facility-administered encounter medications on file as of 10/18/2020.    Allergies (verified) Cat hair extract, Cefaclor, Codeine, Iodinated diagnostic agents, Other, Oxycodone-acetaminophen, and Penicillins   History: Past Medical History:  Diagnosis Date   Anxiety    Cataract    Chronic constipation    Depression    Osteoporosis    Seizures ([name])    epilepsy  last seizure 1977   Past Surgical History:  Procedure Laterality Date   ABDOMINAL HYSTERECTOMY  1995   Dr. Carlis Abbott   CATARACT EXTRACTION Bilateral 12/2016   COLONOSCOPY  2012   patchy increased intraepithelial lymphocytes - draelos   ESOPHAGOGASTRODUODENOSCOPY     multiple   FISSURECTOMY     WRIST SURGERY Left    due to fracture   Family History  Problem Relation Age of Onset   Alcohol abuse Father    Diabetes Father    Diabetes Maternal Grandmother    Diabetes Paternal Grandmother    Colon cancer Maternal Grandfather    Esophageal cancer Neg Hx    Rectal cancer Neg Hx    Stomach cancer Neg Hx    Social History   Socioeconomic History   Marital status: Widowed    Spouse name: Not on file   Number of children: 0   Years of education: Not on file   Highest education level: Some college, no degree  Occupational History   Occupation: Retired  Tobacco Use   Smoking status: Former    Packs/day: 1.00    Types: Cigarettes    Start date: 02/18/1956    Quit  date: 02/17/1981    Years since quitting: 39.6   Smokeless tobacco: Never  Vaping Use   Vaping Use: Never used  Substance and Sexual Activity   Alcohol use: No    Alcohol/week: 0.0 standard drinks   Drug use: No   Sexual activity: Not on file  Other Topics Concern   Not on file  Social History Narrative   Social History     Marital status: Widowed           Spouse name:                        Years of education:  1 year college              Number of children:0           Widowed no children      Occupational History: Warehouse manager, Adm. Therapist, music care, [employer]     None on file      Social History Main Topics     Smoking status: Former Smoker                                             Packs/day: 1.00      Years: 0.00            Types: Cigarettes        Smokeless tobacco: Not on file                        Alcohol use: No               Drug use: No               Sexual activity: Not on file            Does not drink caffeine, but does eat chocolate.   Does live in an apartment ([address]) retirement community does exercise : walks daily   Right handed         Social Determinants of Corporate investment banker Strain: Not on file  Food Insecurity: Not on file  Transportation Needs: Not on file  Physical Activity: Not on file  Stress: Not on file  Social Connections: Not on file    Tobacco Counseling Counseling given: Not Answered   Clinical Intake:  Pre-visit preparation completed: Yes  Pain : No/denies pain     BMI - recorded: 16 Nutritional Status: BMI <19  Underweight Nutritional Risks: None Diabetes: No  How often do you need to have someone help you when you read instructions, pamphlets, or other written materials from your doctor or pharmacy?: 1 - Never  Diabetic?no         Activities of Daily Living In your present state of health, do you have any difficulty performing the following activities: 10/18/2020  Hearing? N  Vision? N  Difficulty concentrating or making decisions? N  Walking or climbing stairs? N  Dressing or bathing? N  Doing errands, shopping? N  Preparing Food and eating ? N  Using the Toilet? N  In the past six months, have you accidently leaked urine? Y  Do you have problems with loss of bowel control? N  Managing your Medications? N  Managing your Finances? N  Housekeeping or managing your Housekeeping? N  Some  recent data might be hidden    Patient Care Team: Mast, Man X, NP as PCP - General (Internal Medicine) Mast,  Man X, NP as Nurse Practitioner (Internal Medicine)  Indicate any recent Medical Services you may have received from other than [facility] providers in the past year (date may be approximate).     Assessment:   This is a routine wellness examination for [name].  Hearing/Vision screen Hearing Screening - Comments:: Patient has no hearing problems. Vision Screening - Comments:: Patient wears glasses. Patient has eye appointment in October. Patient sees Dr. Elmer PickerHecker  Dietary issues and exercise activities discussed: Current Exercise Habits: Home exercise routine, Type of exercise: stretching;calisthenics, Time (Minutes): 30, Frequency (Times/Week): 3, Weekly Exercise (Minutes/Week): 90, Exercise limited by: psychological condition(s)   Goals Addressed   None    Depression Screen PHQ 2/9 Scores 10/18/2020 07/29/2018 03/13/2017 12/11/2016 09/26/2014 08/29/2014 08/24/2014  PHQ - 2 Score 0 0 0 0 0 1 0    Fall Risk Fall Risk  10/18/2020 09/13/2020 08/22/2020 05/17/2020 10/21/2019  Falls in the past year? 0 0 0 0 0  Number falls in past yr: 0 0 0 0 0  Injury with Fall? 0 0 0 - -  Risk for fall due to : No Fall Risks No Fall Risks No Fall Risks - -  Follow up Falls evaluation completed Falls evaluation completed Education provided;Falls prevention discussed;Falls evaluation completed - -    FALL RISK PREVENTION PERTAINING TO THE HOME:  Any stairs in or around the home? Yes  If so, are there any without handrails? No  Home free of loose throw rugs in walkways, pet beds, electrical cords, etc? Yes  Adequate lighting in your home to reduce risk of falls? Yes   ASSISTIVE DEVICES UTILIZED TO PREVENT FALLS:  Life alert? No  Use of a cane, walker or w/c? No  Grab bars in the bathroom? Yes  Shower chair or bench in shower? Yes  Elevated toilet seat or a handicapped toilet? Yes   TIMED UP AND GO:  Was the test performed? No .   Cognitive Function:     6CIT Screen 10/18/2020  What Year? 0 points  What  month? 0 points  What time? 0 points  Count back from 20 2 points  Months in reverse 0 points  Repeat phrase 0 points  Total Score 2    Immunizations Immunization History  Administered Date(s) Administered   Influenza Whole 11/19/2017   Influenza, High Dose Seasonal PF 11/26/2016, 12/01/2018, 11/30/2019   Influenza-Unspecified 11/18/2014, 11/29/2015   Moderna Sars-Covid-2 Vaccination 02/21/2019, 03/21/2019, 06/26/2020   Pneumococcal Conjugate-13 09/24/2017   Pneumococcal Polysaccharide-23 12/30/2012, 09/18/2018   Tdap 08/20/2017   Zoster Recombinat (Shingrix) 07/21/2017, 12/08/2017   Zoster, Live 05/28/2007    TDAP status: Completed at today's visit  Flu Vaccine status: Due, Education has been provided regarding the importance of this vaccine. Advised may receive this vaccine at local pharmacy or Health Dept. Aware to provide a copy of the vaccination record if obtained from local pharmacy or Health Dept. Verbalized acceptance and understanding.  Pneumococcal vaccine status: Completed during today's visit.  Covid-19 vaccine status: Completed vaccines  Qualifies for Shingles Vaccine? Yes   Zostavax completed Yes   Shingrix Completed?: Yes  Screening Tests Health Maintenance  Topic Date Due   FOOT EXAM  Never done   OPHTHALMOLOGY EXAM  Never done   MAMMOGRAM  08/13/2017   INFLUENZA VACCINE  09/17/2020   COVID-19 Vaccine (4 - Booster for Moderna series)  09/26/2020   HEMOGLOBIN A1C  02/14/2021   URINE MICROALBUMIN  04/19/2021   TETANUS/TDAP  08/21/2027   DEXA SCAN  Completed   PNA vac Low Risk Adult  Completed   Zoster Vaccines- Shingrix  Completed   HPV VACCINES  Aged Out    Health Maintenance  Health Maintenance Due  Topic Date Due   FOOT EXAM  Never done   OPHTHALMOLOGY EXAM  Never done   MAMMOGRAM  08/13/2017   INFLUENZA VACCINE  09/17/2020   COVID-19 Vaccine (4 - Booster for Moderna series) 09/26/2020    Colorectal cancer screening: No longer required.    Mammogram status: Completed 2019. Repeat every year  Bone Density status: Completed 2021. Results reflect: Bone density results: OSTEOPOROSIS. Repeat every 2 years.  Lung Cancer Screening: (Low Dose CT Chest recommended if Age 88-80 years, 30 pack-year currently smoking OR have quit w/in 15years.) does not qualify.   Additional Screening:  Hepatitis C Screening: does not qualify;   Vision Screening: Recommended annual ophthalmology exams for early detection of glaucoma and other disorders of the eye. Is the patient up to date with their annual eye exam?  Yes  Who is the provider or what is the name of the office in which the patient attends annual eye exams? [facility] If pt is not established with a provider, would they like to be referred to a provider to establish care? No .   Dental Screening: Recommended annual dental exams for proper oral hygiene  Community Resource Referral / Chronic Care Management: CRR required this visit?  No   CCM required this visit?  No      Plan:     I have personally reviewed and noted the following in the patient's chart:   Medical and social history Use of alcohol, tobacco or illicit drugs  Current medications and supplements including opioid prescriptions.  Functional ability and status Nutritional status Physical activity Advanced directives List of other physicians Hospitalizations, surgeries, and ER visits in previous 12 months Vitals Screenings to include cognitive, depression, and falls Referrals and appointments  In addition, I have reviewed and discussed with patient certain preventive protocols, quality metrics, and best practice recommendations. A written personalized care plan for preventive services as well as general preventive health recommendations were provided to patient.     Sharon Seller, NP   10/18/2020    Virtual Visit via Telephone Note  I connected withNAME@ on 10/18/20 at  1:00 PM EDT by telephone and  verified that I am speaking with the correct person using two identifiers.  Location: Patient: home Provider: [facility]   I discussed the limitations, risks, security and privacy concerns of performing an evaluation and management service by telephone and the availability of in person appointments. I also discussed with the patient that there may be a patient responsible charge related to this service. The patient expressed understanding and agreed to proceed.   I discussed the assessment and treatment plan with the patient. The patient was provided an opportunity to ask questions and all were answered. The patient agreed with the plan and demonstrated an understanding of the instructions.   The patient was advised to call back or seek an in-person evaluation if the symptoms worsen or if the condition fails to improve as anticipated.  I provided 18 minutes of non-face-to-face time during this encounter.  Janene Harvey. [employer] printed and mailed

## 2020-10-18 NOTE — Patient Instructions (Signed)
Erin Ochoa , Thank you for taking time to come for your Medicare Wellness Visit. I appreciate your ongoing commitment to your health goals. Please review the following plan we discussed and let me know if I can assist you in the future.   Screening recommendations/referrals: Colonoscopy aged out Mammogram aged out Bone Density up to date Recommended yearly ophthalmology/optometry visit for glaucoma screening and checkup Recommended yearly dental visit for hygiene and checkup  Vaccinations: Influenza vaccine up to date Pneumococcal vaccine up to date Tdap vaccine up to date Shingles vaccine up to date    Advanced directives: on file.   Conditions/risks identified: fall risk, advanced age  Next appointment: 1 year for AWV   Preventive Care 75 Years and Older, Female Preventive care refers to lifestyle choices and visits with your health care provider that can promote health and wellness. What does preventive care include? A yearly physical exam. This is also called an annual well check. Dental exams once or twice a year. Routine eye exams. Ask your health care provider how often you should have your eyes checked. Personal lifestyle choices, including: Daily care of your teeth and gums. Regular physical activity. Eating a healthy diet. Avoiding tobacco and drug use. Limiting alcohol use. Practicing safe sex. Taking low-dose aspirin every day. Taking vitamin and mineral supplements as recommended by your health care provider. What happens during an annual well check? The services and screenings done by your health care provider during your annual well check will depend on your age, overall health, lifestyle risk factors, and family history of disease. Counseling  Your health care provider may ask you questions about your: Alcohol use. Tobacco use. Drug use. Emotional well-being. Home and relationship well-being. Sexual activity. Eating habits. History of falls. Memory  and ability to understand (cognition). Work and work Astronomer. Reproductive health. Screening  You may have the following tests or measurements: Height, weight, and BMI. Blood pressure. Lipid and cholesterol levels. These may be checked every 5 years, or more frequently if you are over 88 years old. Skin check. Lung cancer screening. You may have this screening every year starting at age 108 if you have a 30-pack-year history of smoking and currently smoke or have quit within the past 15 years. Fecal occult blood test (FOBT) of the stool. You may have this test every year starting at age 23. Flexible sigmoidoscopy or colonoscopy. You may have a sigmoidoscopy every 5 years or a colonoscopy every 10 years starting at age 14. Hepatitis C blood test. Hepatitis B blood test. Sexually transmitted disease (STD) testing. Diabetes screening. This is done by checking your blood sugar (glucose) after you have not eaten for a while (fasting). You may have this done every 1-3 years. Bone density scan. This is done to screen for osteoporosis. You may have this done starting at age 22. Mammogram. This may be done every 1-2 years. Talk to your health care provider about how often you should have regular mammograms. Talk with your health care provider about your test results, treatment options, and if necessary, the need for more tests. Vaccines  Your health care provider may recommend certain vaccines, such as: Influenza vaccine. This is recommended every year. Tetanus, diphtheria, and acellular pertussis (Tdap, Td) vaccine. You may need a Td booster every 10 years. Zoster vaccine. You may need this after age 78. Pneumococcal 13-valent conjugate (PCV13) vaccine. One dose is recommended after age 16. Pneumococcal polysaccharide (PPSV23) vaccine. One dose is recommended after age 86. Talk to your  health care provider about which screenings and vaccines you need and how often you need them. This  information is not intended to replace advice given to you by your health care provider. Make sure you discuss any questions you have with your health care provider. Document Released: 03/02/2015 Document Revised: 10/24/2015 Document Reviewed: 12/05/2014 Elsevier Interactive Patient Education  2017 [place] Prevention in the Home Falls can cause injuries. They can happen to people of all ages. There are many things you can do to make your home safe and to help prevent falls. What can I do on the outside of my home? Regularly fix the edges of walkways and driveways and fix any cracks. Remove anything that might make you trip as you walk through a door, such as a raised step or threshold. Trim any bushes or trees on the path to your home. Use bright outdoor lighting. Clear any walking paths of anything that might make someone trip, such as rocks or tools. Regularly check to see if handrails are loose or broken. Make sure that both sides of any steps have handrails. Any raised decks and porches should have guardrails on the edges. Have any leaves, snow, or ice cleared regularly. Use sand or salt on walking paths during winter. Clean up any spills in your garage right away. This includes oil or grease spills. What can I do in the bathroom? Use night lights. Install grab bars by the toilet and in the tub and shower. Do not use towel bars as grab bars. Use non-skid mats or decals in the tub or shower. If you need to sit down in the shower, use a plastic, non-slip stool. Keep the floor dry. Clean up any water that spills on the floor as soon as it happens. Remove soap buildup in the tub or shower regularly. Attach bath mats securely with double-sided non-slip rug tape. Do not have throw rugs and other things on the floor that can make you trip. What can I do in the bedroom? Use night lights. Make sure that you have a light by your bed that is easy to reach. Do not use any sheets or  blankets that are too big for your bed. They should not hang down onto the floor. Have a firm chair that has side arms. You can use this for support while you get dressed. Do not have throw rugs and other things on the floor that can make you trip. What can I do in the kitchen? Clean up any spills right away. Avoid walking on wet floors. Keep items that you use a lot in easy-to-reach places. If you need to reach something above you, use a strong step stool that has a grab bar. Keep electrical cords out of the way. Do not use floor polish or wax that makes floors slippery. If you must use wax, use non-skid floor wax. Do not have throw rugs and other things on the floor that can make you trip. What can I do with my stairs? Do not leave any items on the stairs. Make sure that there are handrails on both sides of the stairs and use them. Fix handrails that are broken or loose. Make sure that handrails are as long as the stairways. Check any carpeting to make sure that it is firmly attached to the stairs. Fix any carpet that is loose or worn. Avoid having throw rugs at the top or bottom of the stairs. If you do have throw rugs, attach them  to the floor with carpet tape. Make sure that you have a light switch at the top of the stairs and the bottom of the stairs. If you do not have them, ask someone to add them for you. What else can I do to help prevent falls? Wear shoes that: Do not have high heels. Have rubber bottoms. Are comfortable and fit you well. Are closed at the toe. Do not wear sandals. If you use a stepladder: Make sure that it is fully opened. Do not climb a closed stepladder. Make sure that both sides of the stepladder are locked into place. Ask someone to hold it for you, if possible. Clearly mark and make sure that you can see: Any grab bars or handrails. First and last steps. Where the edge of each step is. Use tools that help you move around (mobility aids) if they are  needed. These include: Canes. Walkers. Scooters. Crutches. Turn on the lights when you go into a dark area. Replace any light bulbs as soon as they burn out. Set up your furniture so you have a clear path. Avoid moving your furniture around. If any of your floors are uneven, fix them. If there are any pets around you, be aware of where they are. Review your medicines with your doctor. Some medicines can make you feel dizzy. This can increase your chance of falling. Ask your doctor what other things that you can do to help prevent falls. This information is not intended to replace advice given to you by your health care provider. Make sure you discuss any questions you have with your health care provider. Document Released: 11/30/2008 Document Revised: 07/12/2015 Document Reviewed: 03/10/2014 Elsevier Interactive Patient Education  2017 [employer].

## 2020-11-06 DIAGNOSIS — Z23 Encounter for immunization: Secondary | ICD-10-CM | POA: Diagnosis not present

## 2020-11-08 ENCOUNTER — Other Ambulatory Visit: Payer: Self-pay | Admitting: Nurse Practitioner

## 2020-11-08 DIAGNOSIS — G40909 Epilepsy, unspecified, not intractable, without status epilepticus: Secondary | ICD-10-CM

## 2020-11-08 DIAGNOSIS — G4700 Insomnia, unspecified: Secondary | ICD-10-CM

## 2020-11-08 NOTE — Telephone Encounter (Signed)
Patient has request refill on medication "Clonazepam". Medication last refilled on 09/25/2020. Medication pend and sent to Hazle Nordmann, NP, due to PCP Mast, Man X, NP being out of office. Please Advise.

## 2020-11-20 ENCOUNTER — Encounter: Payer: Self-pay | Admitting: Nurse Practitioner

## 2020-11-20 ENCOUNTER — Non-Acute Institutional Stay: Payer: Medicare Other | Admitting: Nurse Practitioner

## 2020-11-20 ENCOUNTER — Other Ambulatory Visit: Payer: Self-pay

## 2020-11-20 VITALS — BP 114/60 | HR 77 | Temp 98.9°F | Resp 16 | Ht 67.0 in | Wt 105.1 lb

## 2020-11-20 DIAGNOSIS — L853 Xerosis cutis: Secondary | ICD-10-CM | POA: Diagnosis not present

## 2020-11-20 DIAGNOSIS — G40822 Epileptic spasms, not intractable, without status epilepticus: Secondary | ICD-10-CM

## 2020-11-20 DIAGNOSIS — K5904 Chronic idiopathic constipation: Secondary | ICD-10-CM

## 2020-11-20 DIAGNOSIS — R300 Dysuria: Secondary | ICD-10-CM | POA: Diagnosis not present

## 2020-11-20 DIAGNOSIS — E785 Hyperlipidemia, unspecified: Secondary | ICD-10-CM

## 2020-11-20 DIAGNOSIS — M810 Age-related osteoporosis without current pathological fracture: Secondary | ICD-10-CM

## 2020-11-20 DIAGNOSIS — G4700 Insomnia, unspecified: Secondary | ICD-10-CM

## 2020-11-20 DIAGNOSIS — K3189 Other diseases of stomach and duodenum: Secondary | ICD-10-CM | POA: Diagnosis not present

## 2020-11-20 DIAGNOSIS — R7303 Prediabetes: Secondary | ICD-10-CM

## 2020-11-20 NOTE — Assessment & Plan Note (Signed)
takes Atorvastatin 5mg qd, LDL 79 08/15/20 

## 2020-11-20 NOTE — Assessment & Plan Note (Signed)
stable, on Golytely prn, Gavilyte prn.

## 2020-11-20 NOTE — Progress Notes (Addendum)
Location:   clinic [facility]   Place of Service:   clinic [facility] Provider: Chipper Oman NP  Code Status: DNR Goals of Care: [place] 11/20/2020  Does Patient Have a Medical Advance Directive? Yes  Type of Estate agent of [place];Living will  Does patient want to make changes to medical advance directive? No - Patient declined  Copy of Healthcare Power of Attorney in Chart? Yes - validated most recent copy scanned in chart (See row information)     Chief Complaint  Patient presents with   Acute Visit    Patient complains of breakout in private area.    HPI: Patient is a 80 y.o. female seen today for medical management of chronic diseases.    Burning sensation upon urination, denied abd pan, urinary urgency, or fever.   Skin eruptions, macular spots, no itching, usual better after skin moisturizer, feels burns back of her thighs and neck where skin rubbing garments frequently.   Seizures, no active seizures since last seen, on Tegretol 300mg  qd, Clonazepam 0.5mg  prn hs(GDR to stop on her own didn't cause active seizures, but flare up insomnia and anxiety)             Constipation, stable, on Golytely prn, Gavilyte prn.              Hyperlipidemia, takes Atorvastatin 5mg  qd, LDL 79 08/15/20             Prediabetic, Hgb a1c 5.9 04/19/20             OP takes Reclast, t-score -3.6 09/13/19             Insomnia, takes Clonazepam.             Erosive gastropathy, not taking PPI, Hgb 14.0 08/15/20. Diet controls.     Past Medical History:  Diagnosis Date   Anxiety    Cataract    Chronic constipation    Depression    Osteoporosis    Seizures ([name])    epilepsy  last seizure 1977    Past Surgical History:  Procedure Laterality Date   ABDOMINAL HYSTERECTOMY  1995   Dr. 1978   CATARACT EXTRACTION Bilateral 12/2016   COLONOSCOPY  2012   patchy increased intraepithelial lymphocytes - draelos   ESOPHAGOGASTRODUODENOSCOPY     multiple   FISSURECTOMY      WRIST SURGERY Left    due to fracture    Allergies  Allergen Reactions   Cat Hair Extract Other (See Comments)    Patient states that it causes her eyes to swell shut   Cefaclor Other (See Comments)    unknown   Codeine Nausea Only   Iodinated Diagnostic Agents Other (See Comments)    Shrimp caused stomach pain and nausea Per patient can eat fried oysters okay     Other Nausea Only    Unknown allergy Pain medications, Pt states all kinds of pain medications she is unable to take except tylenol   Oxycodone-Acetaminophen Nausea And Vomiting   Penicillins Itching    Allergies as of 11/20/2020       Reactions   Cat Hair Extract Other (See Comments)   Patient states that it causes her eyes to swell shut   Cefaclor Other (See Comments)   unknown   Codeine Nausea Only   Iodinated Diagnostic Agents Other (See Comments)   Shrimp caused stomach pain and nausea Per patient can eat fried oysters okay   Other Nausea Only  Unknown allergy Pain medications, Pt states all kinds of pain medications she is unable to take except tylenol   Oxycodone-acetaminophen Nausea And Vomiting   Penicillins Itching        Medication List        Accurate as of November 20, 2020 11:59 PM. If you have any questions, ask your nurse or doctor.          atorvastatin 10 MG tablet Commonly known as: LIPITOR TAKE (1/2) TABLET DAILY.   clonazePAM 0.5 MG tablet Commonly known as: KLONOPIN TAKE 1 AND 1/2 TABLETS BY MOUTH AT BEDTIME AS NEEDED SLEEP   Golytely 236 g solution Generic drug: polyethylene glycol MIX WITH WATER AND DRINK 3-3 1/2 OUNCES DAILY AS DIRECTED.   PRESERVISION AREDS PO Take 2 tablets by mouth.   TEGretol 200 MG tablet Generic drug: carbamazepine TAKE 1&1/2 TABLETS ONCE DAILY.   Vitamin D (Ergocalciferol) 1.25 MG (50000 UNIT) Caps capsule Commonly known as: DRISDOL TAKE 1 CAPSULE WEEKLY.        Review of Systems:  Review of Systems  Constitutional:   Negative for appetite change, fatigue and fever.  HENT:  Positive for hearing loss. Negative for congestion and voice change.   Eyes:  Negative for visual disturbance.  Respiratory:  Negative for shortness of breath.   Cardiovascular:  Negative for leg swelling.  Gastrointestinal:  Negative for abdominal pain and constipation.  Genitourinary:  Positive for dysuria and frequency. Negative for urgency.       The patient stated she pushes her suprapubic region to help emptying her bladder  Musculoskeletal:  Positive for arthralgias and gait problem.       Mid back pain. Left 5th toe fx a year ago, uses boot for pain relief. Left hip/leg pain  Skin:  Positive for rash.  Neurological:  Positive for numbness. Negative for dizziness, seizures and weakness.       Tingling/numbness left foot sometimes. Occasionally left hip/leg pain sometimes.   Psychiatric/Behavioral:  Positive for sleep disturbance. Negative for behavioral problems. The patient is nervous/anxious.    Health Maintenance  Topic Date Due   FOOT EXAM  Never done   OPHTHALMOLOGY EXAM  Never done   MAMMOGRAM  08/13/2017   INFLUENZA VACCINE  09/17/2020   COVID-19 Vaccine (4 - Booster for Moderna series) 09/18/2020   HEMOGLOBIN A1C  02/14/2021   URINE MICROALBUMIN  04/19/2021   TETANUS/TDAP  08/21/2027   DEXA SCAN  Completed   Zoster Vaccines- Shingrix  Completed   HPV VACCINES  Aged Out    Physical Exam: Vitals:   11/20/20 1406  BP: 114/60  Pulse: 77  Resp: 16  Temp: 98.9 F (37.2 C)  SpO2: 97%  Weight: 105 lb 1.6 oz (47.7 kg)  Height: 5\' 7"  (1.702 m)   Body mass index is 16.46 kg/m. Physical Exam Vitals and nursing note reviewed.  Constitutional:      Appearance: Normal appearance.  HENT:     Head: Normocephalic and atraumatic.  Eyes:     Extraocular Movements: Extraocular movements intact.     Conjunctiva/sclera: Conjunctivae normal.     Pupils: Pupils are equal, round, and reactive to light.   Cardiovascular:     Rate and Rhythm: Normal rate and regular rhythm.     Heart sounds: No murmur heard. Pulmonary:     Effort: Pulmonary effort is normal.     Breath sounds: No rales.  Abdominal:     General: Bowel sounds are normal.     Palpations:  Abdomen is soft.     Tenderness: There is no abdominal tenderness. There is no right CVA tenderness, guarding or rebound.  Musculoskeletal:     Cervical back: Normal range of motion and neck supple.     Right lower leg: No edema.     Left lower leg: No edema.  Skin:    General: Skin is warm and dry.     Findings: Rash present.     Comments: Scattered, macular spots on skin, no itching, but stated back of legs and neck burning, skin moisturizer helps.   Neurological:     General: No focal deficit present.     Mental Status: She is alert and oriented to person, place, and time. Mental status is at baseline.     Gait: Gait normal.  Psychiatric:        Mood and Affect: Mood normal.        Behavior: Behavior normal.        Thought Content: Thought content normal.     Comments: Appears anxious, obsessed with her weight    Labs reviewed: Basic Metabolic Panel: Recent Labs    08/15/20 0813  NA 138  K 4.1  CL 100  CO2 30  GLUCOSE 100*  BUN 16  CREATININE 0.61  CALCIUM 9.2   Liver Function Tests: Recent Labs    08/15/20 0813  AST 18  ALT 16  BILITOT 0.5  PROT 6.2   No results for input(s): LIPASE, AMYLASE in the last 8760 hours. No results for input(s): AMMONIA in the last 8760 hours. CBC: Recent Labs    08/15/20 0813  WBC 5.4  NEUTROABS 3,186  HGB 14.0  HCT 42.2  MCV 95.5  PLT 211   Lipid Panel: Recent Labs    04/19/20 0735 08/15/20 0813  CHOL 161 153  HDL 64 48*  LDLCALC 78 79  TRIG 104 159*  CHOLHDL 2.5 3.2   Lab Results  Component Value Date   HGBA1C 6.0 (H) 08/15/2020    Procedures since last visit: No results found.  Assessment/Plan  Dysuria Burning sensation upon urination, denied abd  pan, urinary urgency, or fever. Obtain UA C/S 11/22/20 Urine culture no growth.   Dry skin dermatitis Skin eruptions, macular spots, no itching, usual better after skin moisturizer, feels burns back of her thighs and neck where skin rubbing garments frequently. Encourage skin moisturizer use, may refer to dermatology if the patient desires.   Epilepsy without status epilepticus, not intractable ([name]) no active seizures since last seen, on Tegretol 300mg  qd, Clonazepam 0.5mg  prn hs(GDR to stop on her own didn't cause active seizures, but flare up insomnia and anxiety)  Constipation stable, on Golytely prn, Gavilyte prn.   Hyperlipidemia takes Atorvastatin 5mg  qd, LDL 79 08/15/20  Pre-diabetes Hgb a1c 5.9 04/19/20  Osteoporosis takes Reclast, t-score -3.6 09/13/19  Insomnia takes Clonazepam.  Erosive gastropathy  not taking PPI, Hgb 14.0 08/15/20. Diet controls.    Labs/tests ordered:  UA C/S  Next appt:  12/13/2020

## 2020-11-20 NOTE — Assessment & Plan Note (Signed)
no active seizures since last seen, on Tegretol 300mg  qd, Clonazepam 0.5mg  prn hs(GDR to stop on her own didn't cause active seizures, but flare up insomnia and anxiety)

## 2020-11-20 NOTE — Assessment & Plan Note (Signed)
Hgb a1c 5.9 04/19/20 

## 2020-11-20 NOTE — Assessment & Plan Note (Signed)
takes Reclast, t-score -3.6 09/13/19 

## 2020-11-20 NOTE — Assessment & Plan Note (Signed)
not taking PPI, Hgb 14.0 08/15/20. Diet controls.

## 2020-11-20 NOTE — Assessment & Plan Note (Addendum)
Burning sensation upon urination, denied abd pan, urinary urgency, or fever. Obtain UA C/S 11/22/20 Urine culture no growth.

## 2020-11-20 NOTE — Assessment & Plan Note (Signed)
takes Clonazepam 

## 2020-11-20 NOTE — Assessment & Plan Note (Signed)
Skin eruptions, macular spots, no itching, usual better after skin moisturizer, feels burns back of her thighs and neck where skin rubbing garments frequently. Encourage skin moisturizer use, may refer to dermatology if the patient desires.

## 2020-11-21 ENCOUNTER — Encounter: Payer: Self-pay | Admitting: Nurse Practitioner

## 2020-11-21 DIAGNOSIS — R300 Dysuria: Secondary | ICD-10-CM | POA: Diagnosis not present

## 2020-11-22 ENCOUNTER — Other Ambulatory Visit: Payer: Medicare Other

## 2020-11-22 DIAGNOSIS — R300 Dysuria: Secondary | ICD-10-CM

## 2020-11-24 LAB — CULTURE, URINE COMPREHENSIVE
MICRO NUMBER:: 12469729
RESULT:: NO GROWTH
SPECIMEN QUALITY:: ADEQUATE

## 2020-11-24 LAB — EXTRA URINE SPECIMEN

## 2020-11-29 ENCOUNTER — Other Ambulatory Visit: Payer: Self-pay

## 2020-11-29 ENCOUNTER — Non-Acute Institutional Stay: Payer: Medicare Other | Admitting: Nurse Practitioner

## 2020-11-29 ENCOUNTER — Encounter: Payer: Self-pay | Admitting: Nurse Practitioner

## 2020-11-29 DIAGNOSIS — L853 Xerosis cutis: Secondary | ICD-10-CM

## 2020-11-29 DIAGNOSIS — E785 Hyperlipidemia, unspecified: Secondary | ICD-10-CM | POA: Diagnosis not present

## 2020-11-29 DIAGNOSIS — K3189 Other diseases of stomach and duodenum: Secondary | ICD-10-CM | POA: Diagnosis not present

## 2020-11-29 DIAGNOSIS — R300 Dysuria: Secondary | ICD-10-CM

## 2020-11-29 DIAGNOSIS — M810 Age-related osteoporosis without current pathological fracture: Secondary | ICD-10-CM | POA: Diagnosis not present

## 2020-11-29 DIAGNOSIS — R7303 Prediabetes: Secondary | ICD-10-CM

## 2020-11-29 DIAGNOSIS — R634 Abnormal weight loss: Secondary | ICD-10-CM

## 2020-11-29 DIAGNOSIS — G4700 Insomnia, unspecified: Secondary | ICD-10-CM

## 2020-11-29 DIAGNOSIS — G40822 Epileptic spasms, not intractable, without status epilepticus: Secondary | ICD-10-CM | POA: Diagnosis not present

## 2020-11-29 DIAGNOSIS — K5904 Chronic idiopathic constipation: Secondary | ICD-10-CM | POA: Diagnosis not present

## 2020-11-29 NOTE — Assessment & Plan Note (Signed)
stable, on Golytely prn, Gavilyte prn.

## 2020-11-29 NOTE — Assessment & Plan Note (Addendum)
Skin eruptions, macular spots, no itching, usual better after skin moisturizer, feels burns back of her thighs and neck where skin rubbing garments frequently. Dermatology f/u.

## 2020-11-29 NOTE — Assessment & Plan Note (Addendum)
no active seizures since last seen, on Tegretol 300mg  qd, Clonazepam 0.5mg  prn hs(GDR to stop on her own didn't cause active seizures, but flare up insomnia and anxiety), last dose was last night. Worsened insomnia.  Will alternating Clonazepam and Melatonin. F/u Neurology.

## 2020-11-29 NOTE — Assessment & Plan Note (Signed)
Burning sensation upon urination, denied abd pan, urinary urgency, or fever, 11/22/20 no growth on urine culture. May try vaginal moisturizer OTC or Estrace.

## 2020-11-29 NOTE — Assessment & Plan Note (Signed)
Erosive gastropathy, not taking PPI, Hgb 14.0 08/15/20. Diet controls.

## 2020-11-29 NOTE — Progress Notes (Signed)
Location:   clinic [facility]   Place of Service:  Clinic (12) Provider: Marlana Latus NP  Code Status: DNR Goals of Care: [place] 11/29/2020  Does Patient Have a Medical Advance Directive? Yes  Type of Paramedic of [place];Living will  Does patient want to make changes to medical advance directive? No - Patient declined  Copy of [place] in Chart? Yes - validated most recent copy scanned in chart (See row information)     Chief Complaint  Patient presents with   Acute Visit    Sleep Deprivation and requested a referral to Neurologist.     HPI: Patient is a 80 y.o. female seen today for medical management of chronic diseases.     Burning sensation upon urination, denied abd pan, urinary urgency, or fever, 11/22/20 no growth on urine culture.              Skin eruptions, macular spots, no itching, usual better after skin moisturizer, feels burns back of her thighs and neck where skin rubbing garments frequently.              Seizures, no active seizures since last seen, on Tegretol 333m qd, Clonazepam 0.557mprn hs(GDR to stop on her own didn't cause active seizures, but flare up insomnia and anxiety)             Constipation, stable, on Golytely prn, Gavilyte prn.              Hyperlipidemia, takes Atorvastatin 52m19md, LDL 79 08/15/20             Prediabetic, Hgb a1c 6.0 08/16/20             OP takes Reclast, t-score -3.6 09/13/19             Insomnia, takes Clonazepam.             Erosive gastropathy, not taking PPI, Hgb 14.0 08/15/20. Diet controls.    Past Medical History:  Diagnosis Date   Anxiety    Cataract    Chronic constipation    Depression    Osteoporosis    Seizures ([facility]  epilepsy  last seizure 1977    Past Surgical History:  Procedure Laterality Date   ABDOMINAL HYSTERECTOMY  1995   Dr. VanEdwyna ShellCATARACT EXTRACTION Bilateral 12/2016   COLONOSCOPY  2012   patchy increased intraepithelial  lymphocytes - draelos   ESOPHAGOGASTRODUODENOSCOPY     multiple   FISSURECTOMY     WRIST SURGERY Left    due to fracture    Allergies  Allergen Reactions   Cat Hair Extract Other (See Comments)    Patient states that it causes her eyes to swell shut   Cefaclor Other (See Comments)    unknown   Codeine Nausea Only   Iodinated Diagnostic Agents Other (See Comments)    Shrimp caused stomach pain and nausea Per patient can eat fried oysters okay     Other Nausea Only    Unknown allergy Pain medications, Pt states all kinds of pain medications she is unable to take except tylenol   Oxycodone-Acetaminophen Nausea And Vomiting   Penicillins Itching    Allergies as of 11/29/2020       Reactions   Cat Hair Extract Other (See Comments)   Patient states that it causes her eyes to swell shut   Cefaclor Other (See Comments)   unknown   Codeine Nausea  Only   Iodinated Diagnostic Agents Other (See Comments)   Shrimp caused stomach pain and nausea Per patient can eat fried oysters okay   Other Nausea Only   Unknown allergy Pain medications, Pt states all kinds of pain medications she is unable to take except tylenol   Oxycodone-acetaminophen Nausea And Vomiting   Penicillins Itching        Medication List        Accurate as of November 29, 2020 11:59 PM. If you have any questions, ask your nurse or doctor.          atorvastatin 10 MG tablet Commonly known as: LIPITOR TAKE (1/2) TABLET DAILY.   clonazePAM 0.5 MG tablet Commonly known as: KLONOPIN Take 0.25 mg by mouth at bedtime. What changed: Another medication with the same name was removed. Continue taking this medication, and follow the directions you see here. Changed by: Paitlyn Mcclatchey X Lilah Mijangos, NP   Golytely 236 g solution Generic drug: polyethylene glycol MIX WITH WATER AND DRINK 3-3 1/2 OUNCES DAILY AS DIRECTED.   PRESERVISION AREDS PO Take 2 tablets by mouth.   TEGretol 200 MG tablet Generic drug:  carbamazepine TAKE 1&1/2 TABLETS ONCE DAILY.   Vitamin D (Ergocalciferol) 1.25 MG (50000 UNIT) Caps capsule Commonly known as: DRISDOL TAKE 1 CAPSULE WEEKLY.        Review of Systems:  Review of Systems  Constitutional:  Positive for unexpected weight change. Negative for appetite change, fatigue and fever.       Lost #3-5Ibs in a week?  HENT:  Positive for hearing loss. Negative for congestion and voice change.   Eyes:  Negative for visual disturbance.  Respiratory:  Negative for shortness of breath.   Cardiovascular:  Negative for leg swelling.  Gastrointestinal:  Negative for abdominal pain and constipation.  Genitourinary:  Positive for dysuria and frequency. Negative for urgency.       The patient stated she pushes her suprapubic region to help emptying her bladder  Musculoskeletal:  Positive for arthralgias and gait problem.       Mid back pain. Left hip/leg pain  Skin:  Positive for rash.  Neurological:  Positive for numbness. Negative for dizziness, seizures and weakness.       Tingling/numbness left foot sometimes. Occasionally left hip/leg pain sometimes.   Psychiatric/Behavioral:  Positive for sleep disturbance. Negative for behavioral problems. The patient is nervous/anxious.    Health Maintenance  Topic Date Due   FOOT EXAM  Never done   OPHTHALMOLOGY EXAM  Never done   MAMMOGRAM  08/13/2017   INFLUENZA VACCINE  09/17/2020   COVID-19 Vaccine (4 - Booster for Moderna series) 09/18/2020   HEMOGLOBIN A1C  02/14/2021   URINE MICROALBUMIN  04/19/2021   TETANUS/TDAP  08/21/2027   DEXA SCAN  Completed   Zoster Vaccines- Shingrix  Completed   HPV VACCINES  Aged Out    Physical Exam: Vitals:   11/29/20 1423  BP: 118/66  Pulse: 80  Temp: (!) 97.1 F (36.2 C)  TempSrc: Temporal  SpO2: 98%  Weight: 100 lb (45.4 kg)  Height: 5' 7"  (1.702 m)   Body mass index is 15.66 kg/m. Physical Exam Vitals and nursing note reviewed.  Constitutional:      Appearance:  Normal appearance.  HENT:     Head: Normocephalic and atraumatic.  Eyes:     Extraocular Movements: Extraocular movements intact.     Conjunctiva/sclera: Conjunctivae normal.     Pupils: Pupils are equal, round, and reactive to light.  Cardiovascular:     Rate and Rhythm: Normal rate and regular rhythm.     Heart sounds: No murmur heard. Pulmonary:     Effort: Pulmonary effort is normal.     Breath sounds: No rales.  Abdominal:     General: Bowel sounds are normal.     Palpations: Abdomen is soft.     Tenderness: There is no abdominal tenderness. There is no right CVA tenderness, guarding or rebound.  Musculoskeletal:     Cervical back: Normal range of motion and neck supple.     Right lower leg: No edema.     Left lower leg: No edema.  Skin:    General: Skin is warm and dry.     Findings: Rash present.     Comments: Scattered, macular spots on skin, no itching, skin moisturizer helps.   Neurological:     General: No focal deficit present.     Mental Status: She is alert and oriented to person, place, and time. Mental status is at baseline.     Gait: Gait normal.  Psychiatric:        Mood and Affect: Mood normal.        Behavior: Behavior normal.        Thought Content: Thought content normal.     Comments: Appears anxious, obsessed with her sleep    Labs reviewed: Basic Metabolic Panel: Recent Labs    08/15/20 0813  NA 138  K 4.1  CL 100  CO2 30  GLUCOSE 100*  BUN 16  CREATININE 0.61  CALCIUM 9.2   Liver Function Tests: Recent Labs    08/15/20 0813  AST 18  ALT 16  BILITOT 0.5  PROT 6.2   No results for input(s): LIPASE, AMYLASE in the last 8760 hours. No results for input(s): AMMONIA in the last 8760 hours. CBC: Recent Labs    08/15/20 0813  WBC 5.4  NEUTROABS 3,186  HGB 14.0  HCT 42.2  MCV 95.5  PLT 211   Lipid Panel: Recent Labs    04/19/20 0735 08/15/20 0813  CHOL 161 153  HDL 64 48*  LDLCALC 78 79  TRIG 104 159*  CHOLHDL 2.5 3.2    Lab Results  Component Value Date   HGBA1C 6.0 (H) 08/15/2020    Procedures since last visit: No results found.  Assessment/Plan  Epilepsy without status epilepticus, not intractable ([name]) no active seizures since last seen, on Tegretol 341m qd, Clonazepam 0.58mprn hs(GDR to stop on her own didn't cause active seizures, but flare up insomnia and anxiety), last dose was last night. Worsened insomnia.  Will alternating Clonazepam and Melatonin. F/u Neurology.   Constipation stable, on Golytely prn, Gavilyte prn.   Hyperlipidemia  takes Atorvastatin 74m37md, LDL 79 08/15/20  Pre-diabetes Diet controlled, Hgb 6.0 08/16/20  Osteoporosis OP takes Reclast, t-score -3.6 09/13/19  Insomnia takes Clonazepam.  Erosive gastropathy Erosive gastropathy, not taking PPI, Hgb 14.0 08/15/20. Diet controls.   Dry skin dermatitis Skin eruptions, macular spots, no itching, usual better after skin moisturizer, feels burns back of her thighs and neck where skin rubbing garments frequently. Dermatology f/u.  Dysuria Burning sensation upon urination, denied abd pan, urinary urgency, or fever, 11/22/20 no growth on urine culture. May try vaginal moisturizer OTC or Estrace.   Weight loss Lost #3-5Ibs in a week, will observe, may consider TSH CBC/diff, CMP/eGFR if persists.    Labs/tests ordered:  none  Next appt:  12/13/2020

## 2020-11-29 NOTE — Assessment & Plan Note (Signed)
OP takes Reclast, t-score -3.6 09/13/19

## 2020-11-29 NOTE — Assessment & Plan Note (Signed)
takes Atorvastatin 5mg qd, LDL 79 08/15/20 

## 2020-11-29 NOTE — Assessment & Plan Note (Signed)
Diet controlled, Hgb 6.0 08/16/20

## 2020-11-29 NOTE — Assessment & Plan Note (Signed)
takes Clonazepam 

## 2020-11-30 ENCOUNTER — Encounter: Payer: Self-pay | Admitting: Nurse Practitioner

## 2020-11-30 NOTE — Assessment & Plan Note (Signed)
Lost #3-5Ibs in a week, will observe, may consider TSH CBC/diff, CMP/eGFR if persists.

## 2020-12-13 ENCOUNTER — Other Ambulatory Visit: Payer: Self-pay | Admitting: Orthopedic Surgery

## 2020-12-13 ENCOUNTER — Encounter: Payer: Self-pay | Admitting: Nurse Practitioner

## 2020-12-13 ENCOUNTER — Other Ambulatory Visit: Payer: Self-pay

## 2020-12-13 ENCOUNTER — Non-Acute Institutional Stay: Payer: Medicare Other | Admitting: Nurse Practitioner

## 2020-12-13 DIAGNOSIS — R300 Dysuria: Secondary | ICD-10-CM | POA: Diagnosis not present

## 2020-12-13 DIAGNOSIS — R7303 Prediabetes: Secondary | ICD-10-CM

## 2020-12-13 DIAGNOSIS — K3189 Other diseases of stomach and duodenum: Secondary | ICD-10-CM | POA: Diagnosis not present

## 2020-12-13 DIAGNOSIS — M810 Age-related osteoporosis without current pathological fracture: Secondary | ICD-10-CM | POA: Diagnosis not present

## 2020-12-13 DIAGNOSIS — G40822 Epileptic spasms, not intractable, without status epilepticus: Secondary | ICD-10-CM

## 2020-12-13 DIAGNOSIS — K5904 Chronic idiopathic constipation: Secondary | ICD-10-CM | POA: Diagnosis not present

## 2020-12-13 DIAGNOSIS — G4700 Insomnia, unspecified: Secondary | ICD-10-CM | POA: Diagnosis not present

## 2020-12-13 DIAGNOSIS — L853 Xerosis cutis: Secondary | ICD-10-CM

## 2020-12-13 DIAGNOSIS — R634 Abnormal weight loss: Secondary | ICD-10-CM | POA: Diagnosis not present

## 2020-12-13 DIAGNOSIS — E785 Hyperlipidemia, unspecified: Secondary | ICD-10-CM

## 2020-12-13 DIAGNOSIS — G40909 Epilepsy, unspecified, not intractable, without status epilepticus: Secondary | ICD-10-CM

## 2020-12-13 MED ORDER — ZOLPIDEM TARTRATE 5 MG PO TABS: 2.5000 mg | ORAL_TABLET | ORAL | 1 refills | Status: DC | PRN

## 2020-12-13 NOTE — Assessment & Plan Note (Signed)
takes Atorvastatin 5mg qd, LDL 79 08/15/20 

## 2020-12-13 NOTE — Assessment & Plan Note (Signed)
may consider labs if persists.

## 2020-12-13 NOTE — Assessment & Plan Note (Signed)
Erosive gastropathy, not taking PPI, Hgb 14.0 08/15/20. Diet controls.

## 2020-12-13 NOTE — Assessment & Plan Note (Addendum)
failed Clonazepam, GDR to qod, will try Ambien 2.5mg  qod(took it in the past). Risk vs benefit explained to the patient. Lifelong feeling lightheadedness when not sleeping well. No focal neurological symptoms.

## 2020-12-13 NOTE — Assessment & Plan Note (Signed)
stable, on Golytely prn, Gavilyte prn.

## 2020-12-13 NOTE — Progress Notes (Signed)
Location:   clinic [facility]   Place of Service:  Clinic (12) Provider: Chipper Oman NP  Code Status: DNR [place] 12/13/2020  Does Patient Have a Medical Advance Directive? Yes  Type of Estate agent of [place];Living will  Does patient want to make changes to medical advance directive? No - Patient declined  Copy of Healthcare Power of Attorney in Chart? Yes - validated most recent copy scanned in chart (See row information)     Chief Complaint  Patient presents with   Medical Management of Chronic Issues    Patient returns to the clinic for follow up.    Quality Metric Gaps     HPI: Patient is a 80 y.o. female seen today for medical management of chronic diseases.    Burning sensation upon urination, denied abd pan, urinary urgency, or fever, 11/22/20 no growth on urine culture.              Skin eruptions, macular spots, no itching, usual better after skin moisturizer, feels burns back of her thighs and neck where skin rubbing garments frequently.              Seizures, no active seizures since last seen, on Tegretol 300mg  qd, Clonazepam 0.5mg  prn hs(GDR to stop on her own didn't cause active seizures, but flare up insomnia and anxiety)             Constipation, stable, on Golytely prn, Gavilyte prn.              Hyperlipidemia, takes Atorvastatin 5mg  qd, LDL 79 08/15/20             Prediabetic, Hgb a1c 6.0 08/16/20             OP takes Reclast, t-score -3.6 09/13/19             Insomnia, takes Clonazepam.             Erosive gastropathy, not taking PPI, Hgb 14.0 08/15/20. Diet controls.    Weight loss, may consider labs if persists.    Past Medical History:  Diagnosis Date   Anxiety    Cataract    Chronic constipation    Depression    Osteoporosis    Seizures ([name])    epilepsy  last seizure 1977    Past Surgical History:  Procedure Laterality Date   ABDOMINAL HYSTERECTOMY  1995   Dr. 1978   CATARACT EXTRACTION  Bilateral 12/2016   COLONOSCOPY  2012   patchy increased intraepithelial lymphocytes - draelos   ESOPHAGOGASTRODUODENOSCOPY     multiple   FISSURECTOMY     WRIST SURGERY Left    due to fracture    Allergies  Allergen Reactions   Cat Hair Extract Other (See Comments)    Patient states that it causes her eyes to swell shut   Cefaclor Other (See Comments)    unknown   Codeine Nausea Only   Iodinated Diagnostic Agents Other (See Comments)    Shrimp caused stomach pain and nausea Per patient can eat fried oysters okay     Other Nausea Only    Unknown allergy Pain medications, Pt states all kinds of pain medications she is unable to take except tylenol   Oxycodone-Acetaminophen Nausea And Vomiting   Penicillins Itching    Allergies as of 12/13/2020       Reactions   Cat Hair Extract Other (See Comments)   Patient states that it causes  her eyes to swell shut   Cefaclor Other (See Comments)   unknown   Codeine Nausea Only   Iodinated Diagnostic Agents Other (See Comments)   Shrimp caused stomach pain and nausea Per patient can eat fried oysters okay   Other Nausea Only   Unknown allergy Pain medications, Pt states all kinds of pain medications she is unable to take except tylenol   Oxycodone-acetaminophen Nausea And Vomiting   Penicillins Itching        Medication List        Accurate as of December 13, 2020 11:59 PM. If you have any questions, ask your nurse or doctor.          atorvastatin 10 MG tablet Commonly known as: LIPITOR TAKE (1/2) TABLET DAILY.   clonazePAM 0.5 MG tablet Commonly known as: KLONOPIN Take 1 tablet (0.5 mg total) by mouth every other day as needed for anxiety. What changed:  how much to take reasons to take this Changed by: Octavia Heir, NP   Golytely 236 g solution Generic drug: polyethylene glycol MIX WITH WATER AND DRINK 3-3 1/2 OUNCES DAILY AS DIRECTED.   PRESERVISION AREDS PO Take 2 tablets by mouth.   TEGretol 200 MG  tablet Generic drug: carbamazepine TAKE 1&1/2 TABLETS ONCE DAILY.   Vitamin D (Ergocalciferol) 1.25 MG (50000 UNIT) Caps capsule Commonly known as: DRISDOL TAKE 1 CAPSULE WEEKLY.   zolpidem 5 MG tablet Commonly known as: AMBIEN Take 0.5 tablets (2.5 mg total) by mouth every other day as needed for sleep. Started by: Eligio Angert X Lavayah Vita, NP        Review of Systems:  Review of Systems  Constitutional:  Negative for fatigue, fever and unexpected weight change.  HENT:  Positive for hearing loss. Negative for congestion and voice change.   Eyes:  Negative for visual disturbance.  Respiratory:  Negative for shortness of breath.   Cardiovascular:  Negative for leg swelling.  Gastrointestinal:  Negative for abdominal pain and constipation.  Genitourinary:  Positive for dysuria and frequency. Negative for urgency.       The patient stated she pushes her suprapubic region to help emptying her bladder, less burning sensation  Musculoskeletal:  Positive for arthralgias and gait problem.       Mid back pain. Left hip/leg pain  Skin:  Positive for rash.       better  Neurological:  Positive for numbness. Negative for seizures and weakness.       Tingling/numbness left foot sometimes. Occasionally left hip/leg pain sometimes. Lightheaded when not sleeping well in lifetime.   Psychiatric/Behavioral:  Positive for sleep disturbance. Negative for behavioral problems. The patient is nervous/anxious.        Feels lightheaded.    Health Maintenance  Topic Date Due   FOOT EXAM  Never done   OPHTHALMOLOGY EXAM  Never done   MAMMOGRAM  08/13/2017   COVID-19 Vaccine (4 - Booster for Moderna series) 08/21/2020   INFLUENZA VACCINE  09/17/2020   HEMOGLOBIN A1C  02/14/2021   URINE MICROALBUMIN  04/19/2021   TETANUS/TDAP  08/21/2027   Pneumonia Vaccine 83+ Years old  Completed   DEXA SCAN  Completed   Zoster Vaccines- Shingrix  Completed   HPV VACCINES  Aged Out    Physical Exam: Vitals:   12/13/20  1438  BP: (!) 116/52  Pulse: 79  Temp: 98 F (36.7 C)  SpO2: 99%  Weight: 103 lb 3.2 oz (46.8 kg)  Height: 5\' 7"  (1.702 m)  Body mass index is 16.16 kg/m. Physical Exam Vitals and nursing note reviewed.  Constitutional:      Appearance: Normal appearance.  HENT:     Head: Normocephalic and atraumatic.     Mouth/Throat:     Mouth: Mucous membranes are moist.  Eyes:     Extraocular Movements: Extraocular movements intact.     Conjunctiva/sclera: Conjunctivae normal.     Pupils: Pupils are equal, round, and reactive to light.  Cardiovascular:     Rate and Rhythm: Normal rate and regular rhythm.     Heart sounds: No murmur heard. Pulmonary:     Effort: Pulmonary effort is normal.     Breath sounds: No rales.  Abdominal:     General: Bowel sounds are normal.     Palpations: Abdomen is soft.     Tenderness: There is no abdominal tenderness. There is no right CVA tenderness, guarding or rebound.  Musculoskeletal:     Cervical back: Normal range of motion and neck supple.     Right lower leg: No edema.     Left lower leg: No edema.  Skin:    General: Skin is warm and dry.     Findings: Rash present.     Comments: Resolving scattered, macular spots on skin, no itching, skin moisturizer helps.   Neurological:     General: No focal deficit present.     Mental Status: She is alert and oriented to person, place, and time. Mental status is at baseline.     Gait: Gait normal.  Psychiatric:        Mood and Affect: Mood normal.        Behavior: Behavior normal.        Thought Content: Thought content normal.     Comments: Appears anxious, obsessed with her sleep    Labs reviewed: Basic Metabolic Panel: Recent Labs    08/15/20 0813  NA 138  K 4.1  CL 100  CO2 30  GLUCOSE 100*  BUN 16  CREATININE 0.61  CALCIUM 9.2   Liver Function Tests: Recent Labs    08/15/20 0813  AST 18  ALT 16  BILITOT 0.5  PROT 6.2   No results for input(s): LIPASE, AMYLASE in the last  8760 hours. No results for input(s): AMMONIA in the last 8760 hours. CBC: Recent Labs    08/15/20 0813  WBC 5.4  NEUTROABS 3,186  HGB 14.0  HCT 42.2  MCV 95.5  PLT 211   Lipid Panel: Recent Labs    04/19/20 0735 08/15/20 0813  CHOL 161 153  HDL 64 48*  LDLCALC 78 79  TRIG 104 159*  CHOLHDL 2.5 3.2   Lab Results  Component Value Date   HGBA1C 6.0 (H) 08/15/2020    Procedures since last visit: No results found.  Assessment/Plan  Dysuria Burning sensation upon urination, denied abd pan, urinary urgency, or fever, 11/22/20 no growth on urine culture. may consider Urology eval.   Dry skin dermatitis Skin eruptions, macular spots, no itching, usual better after skin moisturizer, feels burns back of her thighs and neck where skin rubbing garments frequently. Dermatology eval if the patient desires.   Epilepsy without status epilepticus, not intractable ([name]) no active seizures since last seen, on Tegretol 300mg  qd, Clonazepam 0.5mg  prn hs(GDR to stop on her own didn't cause active seizures, but flare up insomnia and anxiety), pending neurology eval.   Constipation  stable, on Golytely prn, Gavilyte prn.   Hyperlipidemia  takes Atorvastatin 5mg  qd,  LDL 79 08/15/20  Pre-diabetes  Hgb a1c 6.0 08/16/20  Osteoporosis akes Reclast, t-score -3.6 09/13/19  Insomnia failed Clonazepam, GDR to qod, will try Ambien 2.5mg  qod(took it in the past). Risk vs benefit explained to the patient. Lifelong feeling lightheadedness when not sleeping well. No focal neurological symptoms.   Erosive gastropathy Erosive gastropathy, not taking PPI, Hgb 14.0 08/15/20. Diet controls.   Weight loss may consider labs if persists.    Labs/tests ordered:  none  Next appt:  1 week

## 2020-12-13 NOTE — Assessment & Plan Note (Addendum)
no active seizures since last seen, on Tegretol 300mg  qd, Clonazepam 0.5mg  prn hs(GDR to stop on her own didn't cause active seizures, but flare up insomnia and anxiety), pending neurology eval.

## 2020-12-13 NOTE — Assessment & Plan Note (Signed)
Burning sensation upon urination, denied abd pan, urinary urgency, or fever, 11/22/20 no growth on urine culture. may consider Urology eval.

## 2020-12-13 NOTE — Assessment & Plan Note (Signed)
Skin eruptions, macular spots, no itching, usual better after skin moisturizer, feels burns back of her thighs and neck where skin rubbing garments frequently. Dermatology eval if the patient desires.

## 2020-12-13 NOTE — Assessment & Plan Note (Signed)
akes Reclast, t-score -3.6 09/13/19

## 2020-12-13 NOTE — Assessment & Plan Note (Signed)
Hgb a1c 6.0 08/16/20

## 2020-12-14 ENCOUNTER — Encounter: Payer: Self-pay | Admitting: Nurse Practitioner

## 2020-12-14 NOTE — Telephone Encounter (Signed)
Patient has request refill on medication "Clonazepam". Patient medication pend and sent to PCP Mast, Man X, NP for approval. Please Advise.

## 2020-12-19 ENCOUNTER — Other Ambulatory Visit: Payer: Self-pay | Admitting: Orthopedic Surgery

## 2020-12-19 DIAGNOSIS — G4700 Insomnia, unspecified: Secondary | ICD-10-CM

## 2020-12-19 DIAGNOSIS — G40909 Epilepsy, unspecified, not intractable, without status epilepticus: Secondary | ICD-10-CM

## 2020-12-19 MED ORDER — CLONAZEPAM 0.5 MG PO TABS: 0.5000 mg | ORAL_TABLET | ORAL | 0 refills | Status: DC | PRN

## 2020-12-20 ENCOUNTER — Other Ambulatory Visit: Payer: Self-pay

## 2020-12-20 ENCOUNTER — Encounter: Payer: Self-pay | Admitting: Nurse Practitioner

## 2020-12-20 ENCOUNTER — Non-Acute Institutional Stay: Payer: Medicare Other | Admitting: Nurse Practitioner

## 2020-12-20 DIAGNOSIS — G4700 Insomnia, unspecified: Secondary | ICD-10-CM | POA: Diagnosis not present

## 2020-12-20 DIAGNOSIS — G40822 Epileptic spasms, not intractable, without status epilepticus: Secondary | ICD-10-CM

## 2020-12-20 DIAGNOSIS — H259 Unspecified age-related cataract: Secondary | ICD-10-CM

## 2020-12-20 DIAGNOSIS — R7303 Prediabetes: Secondary | ICD-10-CM

## 2020-12-20 DIAGNOSIS — M810 Age-related osteoporosis without current pathological fracture: Secondary | ICD-10-CM | POA: Diagnosis not present

## 2020-12-20 DIAGNOSIS — K5904 Chronic idiopathic constipation: Secondary | ICD-10-CM

## 2020-12-20 DIAGNOSIS — E785 Hyperlipidemia, unspecified: Secondary | ICD-10-CM

## 2020-12-20 DIAGNOSIS — K3189 Other diseases of stomach and duodenum: Secondary | ICD-10-CM | POA: Diagnosis not present

## 2020-12-20 NOTE — Assessment & Plan Note (Signed)
no active seizures since last seen, on Tegretol 300mg  qd, Clonazepam 0.5mg  prn qod hs(GDR to stop on her own didn't cause active seizures, but flare up insomnia and anxiety). Pending f/u Neurology.

## 2020-12-20 NOTE — Assessment & Plan Note (Signed)
takes Atorvastatin 5mg qd, LDL 79 08/15/20 

## 2020-12-20 NOTE — Assessment & Plan Note (Signed)
stable, on Golytely prn, Gavilyte prn.

## 2020-12-20 NOTE — Assessment & Plan Note (Addendum)
Failed Ambien, desires Clonazepam 0.36m qod, Melatonin 545mqod. Will update CBC/diff, CMP/eGFR, TSH, f/u Neurology.  12/21/20 wbc 5.8, TSH 3.20, Na 140, K 4.0, Bun 21, creat 0.65, eGFR 89, negative UA

## 2020-12-20 NOTE — Progress Notes (Addendum)
Location:   [facility]   Place of Service:  [facility] Provider: Marlana Latus NP  Code Status: DNR Goals of Care: [place] 12/13/2020  Does Patient Have a Medical Advance Directive? Yes  Type of Paramedic of [place];Living will  Does patient want to make changes to medical advance directive? No - Patient declined  Copy of [place] in Chart? Yes - validated most recent copy scanned in chart (See row information)     Chief Complaint  Patient presents with  . Follow-up    1 week follow-up     HPI: Patient is a 80 y.o. female seen today for medical management of chronic diseases.      Seizures, no active seizures since last seen, on Tegretol 3253m qd, Clonazepam 0.574mprn qod hs(GDR to stop on her own didn't cause active seizures, but flare up insomnia and anxiety). Pending f/u Neurology              Constipation, stable, on Golytely prn, Gavilyte prn.              Hyperlipidemia, takes Atorvastatin 53m44md, LDL 79 08/15/20             Prediabetic, Hgb a1c 6.0 08/16/20             OP takes Reclast, t-score -3.6 09/13/19             Insomnia, takes Clonazepam, failed Ambien.              Erosive gastropathy, not taking PPI, Hgb 14.0 08/15/20. Diet controls.               Weight loss, may consider labs if persists.                Past Medical History:  Diagnosis Date  . Anxiety   . Cataract   . Chronic constipation   . Depression   . Osteoporosis   . Seizures ([facility]  epilepsy  last seizure 1977    Past Surgical History:  Procedure Laterality Date  . ABDOMINAL HYSTERECTOMY  1995   Dr. VanEdwyna Shell CATARACT EXTRACTION Bilateral 12/2016  . COLONOSCOPY  2012   patchy increased intraepithelial lymphocytes - draelos  . ESOPHAGOGASTRODUODENOSCOPY     multiple  . FISSURECTOMY    . WRIST SURGERY Left    due to fracture    Allergies  Allergen Reactions  . Cat Hair Extract Other (See Comments)    Patient states  that it causes her eyes to swell shut  . Cefaclor Other (See Comments)    unknown  . Codeine Nausea Only  . Iodinated Diagnostic Agents Other (See Comments)    Shrimp caused stomach pain and nausea Per patient can eat fried oysters okay    . Other Nausea Only    Unknown allergy Pain medications, Pt states all kinds of pain medications she is unable to take except tylenol  . Oxycodone-Acetaminophen Nausea And Vomiting  . Penicillins Itching    Allergies as of 12/20/2020       Reactions   Cat Hair Extract Other (See Comments)   Patient states that it causes her eyes to swell shut   Cefaclor Other (See Comments)   unknown   Codeine Nausea Only   Iodinated Diagnostic Agents Other (See Comments)   Shrimp caused stomach pain and nausea Per patient can eat fried oysters okay   Other Nausea Only  Unknown allergy Pain medications, Pt states all kinds of pain medications she is unable to take except tylenol   Oxycodone-acetaminophen Nausea And Vomiting   Penicillins Itching        Medication List        Accurate as of December 20, 2020 11:59 PM. If you have any questions, ask your nurse or doctor.          STOP taking these medications    zolpidem 5 MG tablet Commonly known as: AMBIEN Stopped by: Victor Langenbach X Jadin Kagel, NP       TAKE these medications    atorvastatin 10 MG tablet Commonly known as: LIPITOR TAKE (1/2) TABLET DAILY.   clonazePAM 0.5 MG tablet Commonly known as: KLONOPIN Take 1 tablet (0.5 mg total) by mouth every other day as needed for anxiety.   Golytely 236 g solution Generic drug: polyethylene glycol MIX WITH WATER AND DRINK 3-3 1/2 OUNCES DAILY AS DIRECTED.   PRESERVISION AREDS PO Take 2 tablets by mouth.   TEGretol 200 MG tablet Generic drug: carbamazepine TAKE 1&1/2 TABLETS ONCE DAILY.   Vitamin D (Ergocalciferol) 1.25 MG (50000 UNIT) Caps capsule Commonly known as: DRISDOL TAKE 1 CAPSULE WEEKLY.        Review of Systems:  Review of  Systems  Constitutional:  Negative for fatigue, fever and unexpected weight change.  HENT:  Positive for hearing loss. Negative for congestion and voice change.   Eyes:  Negative for visual disturbance.  Respiratory:  Negative for shortness of breath.   Cardiovascular:  Negative for leg swelling.  Gastrointestinal:  Negative for abdominal pain and constipation.  Genitourinary:  Positive for frequency. Negative for dysuria and urgency.       The patient stated she pushes her suprapubic region to help emptying her bladder  Musculoskeletal:  Positive for arthralgias and gait problem.       Mid back pain. Left hip/leg pain  Skin:  Positive for rash.       better  Neurological:  Positive for numbness. Negative for seizures and weakness.       Tingling/numbness left foot sometimes. Occasionally left hip/leg pain sometimes. Lightheaded when not sleeping well in lifetime.   Psychiatric/Behavioral:  Positive for sleep disturbance. Negative for behavioral problems. The patient is nervous/anxious.        Feels lightheaded if not sleep well at night.    Health Maintenance  Topic Date Due  . FOOT EXAM  Never done  . OPHTHALMOLOGY EXAM  Never done  . MAMMOGRAM  08/13/2017  . COVID-19 Vaccine (4 - Booster for Moderna series) 08/21/2020  . HEMOGLOBIN A1C  02/14/2021  . URINE MICROALBUMIN  04/19/2021  . TETANUS/TDAP  08/21/2027  . Pneumonia Vaccine 18+ Years old  Completed  . INFLUENZA VACCINE  Completed  . DEXA SCAN  Completed  . Zoster Vaccines- Shingrix  Completed  . HPV VACCINES  Aged Out    Physical Exam: Vitals:   12/20/20 1351  BP: 122/68  Pulse: 72  Resp: 18  Temp: (!) 97.4 F (36.3 C)  SpO2: 98%  Height: _0  (1.702 m)   Body mass index is 16.16 kg/m. Physical Exam Vitals and nursing note reviewed.  Constitutional:      Comments: Tired appearance.   HENT:     Head: Normocephalic and atraumatic.     Mouth/Throat:     Mouth: Mucous membranes are moist.  Eyes:      Extraocular Movements: Extraocular movements intact.     Conjunctiva/sclera: Conjunctivae normal.  Pupils: Pupils are equal, round, and reactive to light.  Cardiovascular:     Rate and Rhythm: Normal rate and regular rhythm.     Heart sounds: No murmur heard. Pulmonary:     Effort: Pulmonary effort is normal.     Breath sounds: No rales.  Abdominal:     General: Bowel sounds are normal.     Palpations: Abdomen is soft.     Tenderness: There is no abdominal tenderness.  Musculoskeletal:     Cervical back: Normal range of motion and neck supple.     Right lower leg: No edema.     Left lower leg: No edema.  Skin:    General: Skin is warm and dry.  Neurological:     General: No focal deficit present.     Mental Status: She is alert and oriented to person, place, and time. Mental status is at baseline.     Gait: Gait normal.  Psychiatric:        Mood and Affect: Mood normal.        Behavior: Behavior normal.        Thought Content: Thought content normal.     Comments: Appears anxious, repetitive, obsessed with her sleep    Labs reviewed: Basic Metabolic Panel: Recent Labs    08/15/20 0813  NA 138  K 4.1  CL 100  CO2 30  GLUCOSE 100*  BUN 16  CREATININE 0.61  CALCIUM 9.2   Liver Function Tests: Recent Labs    08/15/20 0813  AST 18  ALT 16  BILITOT 0.5  PROT 6.2   No results for input(s): LIPASE, AMYLASE in the last 8760 hours. No results for input(s): AMMONIA in the last 8760 hours. CBC: Recent Labs    08/15/20 0813  WBC 5.4  NEUTROABS 3,186  HGB 14.0  HCT 42.2  MCV 95.5  PLT 211   Lipid Panel: Recent Labs    04/19/20 0735 08/15/20 0813  CHOL 161 153  HDL 64 48*  LDLCALC 78 79  TRIG 104 159*  CHOLHDL 2.5 3.2   Lab Results  Component Value Date   HGBA1C 6.0 (H) 08/15/2020    Procedures since last visit: No results found.  Assessment/Plan  Constipation stable, on Golytely prn, Gavilyte prn.   Hyperlipidemia takes Atorvastatin 50m  qd, LDL 79 08/15/20  Pre-diabetes Hgb a1c 6.0 08/16/20  Osteoporosis OP takes Reclast, t-score -3.6 09/13/19  Insomnia Failed Ambien, desires Clonazepam 0.562mqod, Melatonin 14m53mod. Will update CBC/diff, CMP/eGFR, TSH, f/u Neurology.  12/21/20 wbc 5.8, TSH 3.20, Na 140, K 4.0, Bun 21, creat 0.65, eGFR 89, negative UA  Erosive gastropathy Erosive gastropathy, not taking PPI, Hgb 14.0 08/15/20. Diet controls.   Epilepsy without status epilepticus, not intractable ([name])  no active seizures since last seen, on Tegretol 300m32m, Clonazepam 0.14mg 64m qod hs(GDR to stop on her own didn't cause active seizures, but flare up insomnia and anxiety). Pending f/u Neurology.    Labs/tests ordered: CBC/diff, CMP/eGFR, TSH  Next appt:  prn  Time spend 40 minutes.

## 2020-12-20 NOTE — Assessment & Plan Note (Signed)
Erosive gastropathy, not taking PPI, Hgb 14.0 08/15/20. Diet controls.

## 2020-12-20 NOTE — Assessment & Plan Note (Signed)
Hgb a1c 6.0 08/16/20

## 2020-12-20 NOTE — Assessment & Plan Note (Signed)
OP takes Reclast, t-score -3.6 09/13/19

## 2020-12-21 ENCOUNTER — Encounter: Payer: Self-pay | Admitting: Nurse Practitioner

## 2020-12-21 DIAGNOSIS — I10 Essential (primary) hypertension: Secondary | ICD-10-CM | POA: Diagnosis not present

## 2020-12-21 DIAGNOSIS — N390 Urinary tract infection, site not specified: Secondary | ICD-10-CM | POA: Diagnosis not present

## 2020-12-21 DIAGNOSIS — E039 Hypothyroidism, unspecified: Secondary | ICD-10-CM | POA: Diagnosis not present

## 2020-12-21 LAB — HEPATIC FUNCTION PANEL
ALT: 16 (ref 7–35)
AST: 17 (ref 13–35)
Alkaline Phosphatase: 38 (ref 25–125)

## 2020-12-21 LAB — COMPREHENSIVE METABOLIC PANEL
Albumin: 3.8 (ref 3.5–5.0)
Calcium: 9.4 (ref 8.7–10.7)
Globulin: 1.9

## 2020-12-21 LAB — BASIC METABOLIC PANEL
BUN: 21 (ref 4–21)
CO2: 30 — AB (ref 13–22)
Chloride: 105 (ref 99–108)
Creatinine: 0.7 (ref 0.5–1.1)
Glucose: 98
Potassium: 4 (ref 3.4–5.3)
Sodium: 140 (ref 137–147)

## 2020-12-21 LAB — CBC AND DIFFERENTIAL
HCT: 38 (ref 36–46)
Hemoglobin: 12.9 (ref 12.0–16.0)
Platelets: 175 (ref 150–399)
WBC: 5.8

## 2020-12-21 LAB — CBC: RBC: 3.96 (ref 3.87–5.11)

## 2020-12-21 LAB — TSH: TSH: 3.2 (ref 0.41–5.90)

## 2020-12-27 DIAGNOSIS — H40013 Open angle with borderline findings, low risk, bilateral: Secondary | ICD-10-CM | POA: Diagnosis not present

## 2020-12-27 DIAGNOSIS — H353132 Nonexudative age-related macular degeneration, bilateral, intermediate dry stage: Secondary | ICD-10-CM | POA: Diagnosis not present

## 2020-12-27 DIAGNOSIS — H35722 Serous detachment of retinal pigment epithelium, left eye: Secondary | ICD-10-CM | POA: Diagnosis not present

## 2020-12-27 DIAGNOSIS — H35013 Changes in retinal vascular appearance, bilateral: Secondary | ICD-10-CM | POA: Diagnosis not present

## 2021-01-02 ENCOUNTER — Ambulatory Visit (INDEPENDENT_AMBULATORY_CARE_PROVIDER_SITE_OTHER): Payer: Medicare Other | Admitting: Neurology

## 2021-01-02 ENCOUNTER — Encounter: Payer: Self-pay | Admitting: Neurology

## 2021-01-02 VITALS — BP 134/72 | HR 90 | Ht 67.0 in | Wt 104.0 lb

## 2021-01-02 DIAGNOSIS — G40822 Epileptic spasms, not intractable, without status epilepticus: Secondary | ICD-10-CM | POA: Diagnosis not present

## 2021-01-02 DIAGNOSIS — F5101 Primary insomnia: Secondary | ICD-10-CM | POA: Diagnosis not present

## 2021-01-02 NOTE — Patient Instructions (Addendum)
Continue with Tegretol  Continue with Klonopin until you establish care with [facility]  Referral to [facility]  Return if worse

## 2021-01-02 NOTE — Progress Notes (Signed)
[facility]  PATIENT: Erin Ochoa DOB: [birth year]  REQUESTING CLINICIAN: Mast, Man X, NP HISTORY FROM: Patient  REASON FOR VISIT: Chronic insomnia    HISTORICAL  CHIEF COMPLAINT:  Chief Complaint  Patient presents with   New Patient (Initial Visit)    Rm 12, alone, NX [place] 2019/ Internal referral for Nonintractable epileptic spasms without status epilepticus Today concerned about Klonopin not working well for her,  C/o lightheadedness and dizziness     HISTORY OF PRESENT ILLNESS:  This is a 80 year old woman with past medical history of chronic insomnia, seizure disorder, and macular degeneration who is presenting for management of her chronic insomnia.  Patient reports suffering from insomnia for more than 15 years initially she was started on Klonopin low-dose that seems to help with her insomnia.  She reported taking a quarter of 0.5 mg initially but due to increased tolerance she has increased the medication up to the point now that she is taking 0.5 mg nightly without any improvement.  In 2019 she has seen Dr. Lucia Gaskins for the same, she recommended referral to sleep/insomnia counseling for further management but patient has not followed up.  She did try Ambien without much relief for her sleep.  On average she is sleeping 2 to 3 hours per night if she can get sleep.  Patient reported some night she does not have any sleep and other nights she can sleep up to 6-6 and half hours.  If he does not have a good night sleep the next morning she is complaining of dizziness and lightheadedness, denies any fall.    For history of seizures, reported seizures are well controlled with Tegretol brand-name, last seizure was in 1977.  She is compliant with Tegretol 300 mg daily, denies any side effects.    She currently lives in an assisted living facility   Handedness: Right handed   Seizure Type: Generalized   Current frequency: last seizure 1977  Any injuries from  seizures: None   Seizure risk factors: Unclear   Previous ASMs: Dilantin, Tegretol   Currenty ASMs: Tegretol 300 mg daily   ASMs side effects: None   Brain Images: N/A  Previous EEGs: N/A   OTHER MEDICAL CONDITIONS: Insomnia, macular degeneration,   REVIEW OF SYSTEMS: Full 14 system review of systems performed and negative with exception of: as noted in the HPI  ALLERGIES: Allergies  Allergen Reactions   Cat Hair Extract Other (See Comments)    Patient states that it causes her eyes to swell shut   Cefaclor Other (See Comments)    unknown   Codeine Nausea Only   Iodinated Diagnostic Agents Other (See Comments)    Shrimp caused stomach pain and nausea Per patient can eat fried oysters okay     Other Nausea Only    Unknown allergy Pain medications, Pt states all kinds of pain medications she is unable to take except tylenol   Oxycodone-Acetaminophen Nausea And Vomiting   Penicillins Itching    HOME MEDICATIONS: Outpatient Medications Prior to Visit  Medication Sig Dispense Refill   atorvastatin (LIPITOR) 10 MG tablet TAKE (1/2) TABLET DAILY. 45 tablet 1   clonazePAM (KLONOPIN) 0.5 MG tablet Take 1 tablet (0.5 mg total) by mouth every other day as needed for anxiety. 45 tablet 0   GOLYTELY 236 g solution MIX WITH WATER AND DRINK 3-3 1/2 OUNCES DAILY AS DIRECTED. 200 mL 5   Multiple Vitamins-Minerals (PRESERVISION AREDS PO) Take 2 tablets by mouth.  TEGRETOL 200 MG tablet TAKE 1&1/2 TABLETS ONCE DAILY. 45 tablet 5   Vitamin D, Ergocalciferol, (DRISDOL) 1.25 MG (50000 UNIT) CAPS capsule TAKE 1 CAPSULE WEEKLY. 12 capsule 3   zolpidem (AMBIEN) 5 MG tablet Take 2.5 mg by mouth at bedtime as needed for sleep.     No facility-administered medications prior to visit.    PAST MEDICAL HISTORY: Past Medical History:  Diagnosis Date   Anxiety    Cataract    Chronic constipation    Depression    Osteoporosis    Seizures ([name])    epilepsy  last seizure 1977    PAST  SURGICAL HISTORY: Past Surgical History:  Procedure Laterality Date   ABDOMINAL HYSTERECTOMY  1995   Dr. Carlis Abbott   CATARACT EXTRACTION Bilateral 12/2016   COLONOSCOPY  2012   patchy increased intraepithelial lymphocytes - draelos   ESOPHAGOGASTRODUODENOSCOPY     multiple   FISSURECTOMY     WRIST SURGERY Left    due to fracture    FAMILY HISTORY: Family History  Problem Relation Age of Onset   Alcohol abuse Father    Diabetes Father    Diabetes Maternal Grandmother    Diabetes Paternal Grandmother    Colon cancer Maternal Grandfather    Esophageal cancer Neg Hx    Rectal cancer Neg Hx    Stomach cancer Neg Hx     SOCIAL HISTORY: Social History   Socioeconomic History   Marital status: Widowed    Spouse name: Not on file   Number of children: 0   Years of education: Not on file   Highest education level: Some college, no degree  Occupational History   Occupation: Retired  Tobacco Use   Smoking status: Former    Packs/day: 1.00    Types: Cigarettes    Start date: 02/18/1956    Quit date: 02/17/1981    Years since quitting: 39.9   Smokeless tobacco: Never  Vaping Use   Vaping Use: Never used  Substance and Sexual Activity   Alcohol use: No    Alcohol/week: 0.0 standard drinks   Drug use: No   Sexual activity: Not on file  Other Topics Concern   Not on file  Social History Narrative   Social History     Marital status: Widowed           Spouse name:                        Years of education:  1 year college              Number of children:0           Widowed no children      Occupational History: Editor, commissioning, Adm. Therapist, music care, [employer]     None on file      Social History Main Topics     Smoking status: Former Smoker                                             Packs/day: 1.00      Years: 0.00            Types: Cigarettes        Smokeless tobacco: Not on file  Alcohol use: No                Drug use: No               Sexual activity: Not on file            Does not drink caffeine, but does eat chocolate.   Does live in an apartment ([address]) retirement community does exercise : walks daily   Right handed         Social Determinants of Health   Financial Resource Strain: Not on file  Food Insecurity: Not on file  Transportation Needs: Not on file  Physical Activity: Not on file  Stress: Not on file  Social Connections: Not on file  Intimate Partner Violence: Not on file    PHYSICAL EXAM  GENERAL EXAM/CONSTITUTIONAL: Vitals:  Vitals:   01/02/21 0941  BP: 134/72  Pulse: 90  Weight: 104 lb (47.2 kg)  Height: 5\' 7"  (1.702 m)   Body mass index is 16.29 kg/m. Wt Readings from Last 3 Encounters:  01/02/21 104 lb (47.2 kg)  12/13/20 103 lb 3.2 oz (46.8 kg)  11/29/20 100 lb (45.4 kg)   Patient is in no distress; well developed, nourished and groomed; neck is supple  CARDIOVASCULAR: Examination of carotid arteries is normal; no carotid bruits Regular rate and rhythm, no murmurs Examination of peripheral vascular system by observation and palpation is normal  EYES: Pupils round and reactive to light, Visual fields full to confrontation, Extraocular movements intacts,  No results found.  MUSCULOSKELETAL: Gait, strength, tone, movements noted in Neurologic exam below  NEUROLOGIC: MENTAL STATUS:  No flowsheet data found. awake, alert, oriented to person, place and time recent and remote memory intact normal attention and concentration language fluent, comprehension intact, naming intact fund of knowledge appropriate  CRANIAL NERVE:  2nd, 3rd, 4th, 6th - pupils equal and reactive to light, visual fields full to confrontation, extraocular muscles intact, no nystagmus 5th - facial sensation symmetric 7th - facial strength symmetric 8th - hearing intact 9th - palate elevates symmetrically, uvula midline 11th - shoulder shrug symmetric 12th - tongue  protrusion midline  MOTOR:  normal bulk and tone, full strength in the BUE, BLE  SENSORY:  normal and symmetric to light touch, pinprick, temperature, vibration  COORDINATION:  finger-nose-finger, fine finger movements normal  REFLEXES:  deep tendon reflexes present and symmetric  GAIT/STATION:  normal   DIAGNOSTIC DATA (LABS, IMAGING, TESTING) - I reviewed patient records, labs, notes, testing and imaging myself where available.  Lab Results  Component Value Date   WBC 5.4 08/15/2020   HGB 14.0 08/15/2020   HCT 42.2 08/15/2020   MCV 95.5 08/15/2020   PLT 211 08/15/2020      Component Value Date/Time   NA 138 08/15/2020 0813   NA 141 05/26/2017 0000   K 4.1 08/15/2020 0813   CL 100 08/15/2020 0813   CO2 30 08/15/2020 0813   GLUCOSE 100 (H) 08/15/2020 0813   BUN 16 08/15/2020 0813   BUN 15 05/26/2017 0000   CREATININE 0.61 08/15/2020 0813   CALCIUM 9.2 08/15/2020 0813   PROT 6.2 08/15/2020 0813   ALBUMIN 4.6 04/26/2018 1513   AST 18 08/15/2020 0813   ALT 16 08/15/2020 0813   ALKPHOS 53 04/26/2018 1513   BILITOT 0.5 08/15/2020 0813   GFRNONAA 86 08/15/2020 0813   GFRAA 99 08/15/2020 0813   Lab Results  Component Value Date   CHOL 153 08/15/2020   HDL 48 (  L) 08/15/2020   LDLCALC 79 08/15/2020   TRIG 159 (H) 08/15/2020   Lab Results  Component Value Date   HGBA1C 6.0 (H) 08/15/2020   No results found for: [id number] Lab Results  Component Value Date   TSH 3.09 06/08/2019      ASSESSMENT AND PLAN  80 y.o. year old female  with with past medical history of primary insomnia, seizure disorder and macular degeneration who is presenting for further management of her primary sleep disorder.  Patient reported this problem has been going on for the past 15 years, initially has tried low-dose Klonopin but now she is on 0.5 mg Klonopin and will get on average 2 to 3 hours of sleep per night, some nights she does not sleep although she can sleep up to 6 to 6-1/2  hours other nights.  She was seen by previous neurologist 3 years ago, recommended follow-up with psychiatry and primary care for management of her sleep disorder but patient has not follow-up.  I will still refer her to primary care and also to counseling/psychiatry, [facility] in particular [facility] for further management of her primary symptoms (Print out given to patient). For seizure disorder, it is well managed on Tegretol 300 mg daily.  Will recommend patient to keep medication at the same dose, no changes.  Follow-up with primary care doctor, follow-up with psychiatry/counseling, and return if worse.   1. Primary insomnia   2. 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  Other recommendations include using caution when using heavy equipment or power tools. Avoid working on ladders or at heights. Take showers instead of baths.  Do not swim alone.  Ensure the water temperature is not too high on the home water heater. Do not go swimming alone. Do not lock yourself in a room alone (i.e. bathroom). When caring for infants or small children, sit down when holding, feeding, or changing them to minimize risk of injury to the child in the event you have a seizure. Maintain good sleep hygiene. Avoid alcohol.  Also recommend adequate sleep, hydration, good diet and minimize stress.   During the Seizure  - First, ensure adequate ventilation and place patients on the floor on their left side  Loosen clothing around the neck and ensure the airway is patent. If the patient is clenching the teeth, do not force the mouth open with any object as this can  cause severe damage - Remove all items from the surrounding that can be hazardous. The patient may be oblivious to what's happening and may not even know what he or she is doing. If the patient is confused and wandering, either gently guide him/her away and block access to outside areas - Reassure the individual and be comforting - Call 911. In most cases, the seizure ends before EMS arrives. However, there are cases when seizures may last over 3 to 5 minutes. Or the individual may have developed breathing difficulties or severe injuries. If a pregnant patient or a person with diabetes develops a seizure, it is prudent to call an ambulance. - Finally, if the patient does not regain full consciousness, then call EMS. Most patients will remain  confused for about 45 to 90 minutes after a seizure, so you must use judgment in calling for help. - Avoid restraints but make sure the patient is in a bed with padded side rails - Place the individual in a lateral position with the neck slightly flexed; this will help the saliva drain from the mouth and prevent the tongue from falling backward - Remove all nearby furniture and other hazards from the area - Provide verbal assurance as the individual is regaining consciousness - Provide the patient with privacy if possible - Call for help and start treatment as ordered by the caregiver   After the Seizure (Postictal Stage)  After a seizure, most patients experience confusion, fatigue, muscle pain and/or a headache. Thus, one should permit the individual to sleep. For the next few days, reassurance is essential. Being calm and helping reorient the person is also of importance.  Most seizures are painless and end spontaneously. Seizures are not harmful to others but can lead to complications such as stress on the lungs, brain and the heart. Individuals with prior lung problems may develop labored breathing and respiratory distress.     Orders Placed This  Encounter  Procedures   Ambulatory referral to Psychiatry     No orders of the defined types were placed in this encounter.   Return if symptoms worsen or fail to improve.    Windell Norfolk, MD 01/02/2021, 4:09 PM  [facility] [address] [place], [place] [address] [phone]

## 2021-01-03 ENCOUNTER — Telehealth: Payer: Self-pay | Admitting: Neurology

## 2021-01-03 NOTE — Telephone Encounter (Signed)
Referral has been sent to [facility]. Phone: [phone].

## 2021-01-04 NOTE — Progress Notes (Signed)
[facility] - Clinic Note  01/07/2021     CHIEF COMPLAINT Patient presents for Retina Evaluation   HISTORY OF PRESENT ILLNESS: Erin Ochoa is a 80 y.o. female who presents to the clinic today for:   HPI     Retina Evaluation   In both eyes.  I, the attending physician,  performed the HPI with the patient and updated documentation appropriately.        Comments   Patient here for Retina Evaluation. Referred by Dr Elmer Picker. Patient states vision is ok. Dr Elmer Picker saw ARMD. No eye pain.      Last edited by Rennis Chris, MD on 01/07/2021  4:12 PM.    Pt is here on the referral of Dr. Elmer Picker for concern of worsening ARMD OU, pt states Dr. Elmer Picker did cataract sx about 3 years ago, pt states she found out about the ARMD after cataract sx, pt states she is in assisted living temporally bc she is unable to sleep and is having seizures, she is taking AREDS 2 BID and checks her vision with amsler grid  Referring physician: Mateo Flow, MD [address] [place],  [place] [address]  HISTORICAL INFORMATION:   Selected notes from the MEDICAL RECORD NUMBER Referred by Dr. Elmer Picker for progressing ARMD  LEE:  Ocular Hx- PMH-    CURRENT MEDICATIONS: No current outpatient medications on file. (Ophthalmic Drugs)   No current facility-administered medications for this visit. (Ophthalmic Drugs)   Current Outpatient Medications (Other)  Medication Sig   atorvastatin (LIPITOR) 10 MG tablet TAKE (1/2) TABLET DAILY.   clonazePAM (KLONOPIN) 0.5 MG tablet Take 1 tablet (0.5 mg total) by mouth every other day as needed for anxiety.   GOLYTELY 236 g solution MIX WITH WATER AND DRINK 3-3 1/2 OUNCES DAILY AS DIRECTED.   Multiple Vitamins-Minerals (PRESERVISION AREDS PO) Take 2 tablets by mouth.   TEGRETOL 200 MG tablet TAKE 1&1/2 TABLETS ONCE DAILY.   Vitamin D, Ergocalciferol, (DRISDOL) 1.25 MG (50000 UNIT) CAPS capsule TAKE 1 CAPSULE WEEKLY.   zolpidem (AMBIEN) 5  MG tablet Take 2.5 mg by mouth at bedtime as needed for sleep. (Patient not taking: Reported on 01/07/2021)   No current facility-administered medications for this visit. (Other)   REVIEW OF SYSTEMS: ROS   Positive for: Eyes Last edited by Laddie Aquas, COA on 01/07/2021  1:41 PM.     ALLERGIES Allergies  Allergen Reactions   Cat Hair Extract Other (See Comments)    Patient states that it causes her eyes to swell shut   Cefaclor Other (See Comments)    unknown   Codeine Nausea Only   Iodinated Diagnostic Agents Other (See Comments)    Shrimp caused stomach pain and nausea Per patient can eat fried oysters okay     Other Nausea Only    Unknown allergy Pain medications, Pt states all kinds of pain medications she is unable to take except tylenol   Oxycodone-Acetaminophen Nausea And Vomiting   Penicillins Itching    PAST MEDICAL HISTORY Past Medical History:  Diagnosis Date   Anxiety    Cataract    Chronic constipation    Depression    Osteoporosis    Seizures ([name])    epilepsy  last seizure 1977   Past Surgical History:  Procedure Laterality Date   ABDOMINAL HYSTERECTOMY  1995   Dr. Carlis Abbott   CATARACT EXTRACTION Bilateral 12/2016   COLONOSCOPY  2012   patchy increased intraepithelial lymphocytes - draelos  ESOPHAGOGASTRODUODENOSCOPY     multiple   FISSURECTOMY     WRIST SURGERY Left    due to fracture   FAMILY HISTORY Family History  Problem Relation Age of Onset   Alcohol abuse Father    Diabetes Father    Diabetes Maternal Grandmother    Diabetes Paternal Grandmother    Colon cancer Maternal Grandfather    Esophageal cancer Neg Hx    Rectal cancer Neg Hx    Stomach cancer Neg Hx    SOCIAL HISTORY Social History   Tobacco Use   Smoking status: Former    Packs/day: 1.00    Types: Cigarettes    Start date: 02/18/1956    Quit date: 02/17/1981    Years since quitting: 39.9   Smokeless tobacco: Never  Vaping Use   Vaping Use: Never used   Substance Use Topics   Alcohol use: No    Alcohol/week: 0.0 standard drinks   Drug use: No       OPHTHALMIC EXAM: Base Eye Exam     Visual Acuity (Snellen - Linear)       Right Left   Dist cc 20/20 -2 20/25 -2   Dist ph cc  NI    Correction: Glasses         Tonometry (Tonopen, 1:37 PM)       Right Left   Pressure 10 10         Pupils       Dark Light Shape React APD   Right 3 2 Round Brisk None   Left 3 2 Round Brisk None         Visual Fields (Counting fingers)       Left Right    Full Full         Extraocular Movement       Right Left    Full, Ortho Full, Ortho         Neuro/Psych     Oriented x3: Yes   Mood/Affect: Normal         Dilation     Both eyes: 1.0% Mydriacyl, 2.5% Phenylephrine @ 1:37 PM           Slit Lamp and Fundus Exam     Slit Lamp Exam       Right Left   Lids/Lashes Dermatochalasis - upper lid, Meibomian gland dysfunction Dermatochalasis - upper lid, Meibomian gland dysfunction   Conjunctiva/Sclera White and quiet White and quiet   Cornea 2+ inferior Punctate epithelial erosions, arcus mild arcus, trace Punctate epithelial erosions   Anterior Chamber Deep and quiet Deep and quiet   Iris Round and dilated Round and dilated   Lens PC IOL in good position PC IOL in good position, 1+ Posterior capsular opacification   Anterior Vitreous Vitreous syneresis Vitreous syneresis         Fundus Exam       Right Left   Disc Pink and Sharp Pink and Sharp   C/D Ratio 0.5 0.5   Macula Flat, Blunted foveal reflex, Drusen, RPE mottling, No heme or edema, +PED Flat, Blunted foveal reflex, central PEDs, Drusen, RPE mottling, No heme or edema   Vessels attenuated, mild tortuousity, mild AV crossing changes attenuated, mild tortuousity, mild AV crossing changes, mild Copper wiring   Periphery Attached, mild reticular degeneration, No heme Attached, mild reticular degeneration, No heme           Refraction      Wearing Rx  Sphere Cylinder Axis Add   Right -1.75 +1.25 002 +2.50   Left -1.00 +1.75 172 +2.50    Age: 10yr   Type: PAL         Manifest Refraction       Sphere Cylinder Axis Dist VA   Right -1.75 +1.75 002 20/20-   Left -0.50 +2.00 170 20/25-2           IMAGING AND PROCEDURES  Imaging and Procedures for 01/07/2021  OCT, Retina - OU - Both Eyes       Right Eye Quality was good. Central Foveal Thickness: 304. Progression has no prior data. Findings include normal foveal contour, no IRF, no SRF, retinal drusen , pigment epithelial detachment.   Left Eye Quality was good. Central Foveal Thickness: 325. Progression has no prior data. Findings include normal foveal contour, no IRF, no SRF, retinal drusen , pigment epithelial detachment (Prominent PED centrally).   Notes *Images captured and stored on drive  Diagnosis / Impression:  Non-exu ARMD OU - prominent PEDs OU  Clinical management:  See below  Abbreviations: NFP - Normal foveal profile. CME - cystoid macular edema. PED - pigment epithelial detachment. IRF - intraretinal fluid. SRF - subretinal fluid. EZ - ellipsoid zone. ERM - epiretinal membrane. ORA - outer retinal atrophy. ORT - outer retinal tubulation. SRHM - subretinal hyper-reflective material. IRHM - intraretinal hyper-reflective material            ASSESSMENT/PLAN:    ICD-10-CM   1. Intermediate stage nonexudative age-related macular degeneration of both eyes  H35.3132     2. Retinal edema  H35.81 OCT, Retina - OU - Both Eyes    3. Pseudophakia, both eyes  Z96.1      1,2. Age related macular degeneration, non-exudative, both eyes  - intermediate stage with prominent PEDs OU (OS > OD)  - The incidence, anatomy, and pathology of dry AMD, risk of progression, and the AREDS and AREDS 2 study including smoking risks discussed with patient.  - Recommend amsler grid monitoring  - f/u 3-4 months, DFE, OCT  3. Pseudophakia OU  - s/p CE/IOL  (Dr. Elmer Picker)  - IOLs in good position, doing well  - monitor  Ophthalmic Meds Ordered this visit:  No orders of the defined types were placed in this encounter.    Return for f/u 3-4 months, non-exu ARMD OU, DFE, OCT.  There are no Patient Instructions on file for this visit.   Explained the diagnoses, plan, and follow up with the patient and they expressed understanding.  Patient expressed understanding of the importance of proper follow up care.   This document serves as a record of services personally performed by Karie Chimera, MD, PhD. It was created on their behalf by Annalee Genta, COMT. The creation of this record is the provider's dictation and/or activities during the visit.  Electronically signed by: Annalee Genta, COMT 01/07/21 4:19 PM  This document serves as a record of services personally performed by Karie Chimera, MD, PhD. It was created on their behalf by Glee Arvin. Manson Passey, OA an ophthalmic technician. The creation of this record is the provider's dictation and/or activities during the visit.    Electronically signed by: Glee Arvin. Manson Passey, [place] 11.21.2022 4:19 PM   Karie Chimera, M.D., Ph.D. Diseases & Surgery of the Retina and Vitreous [facility]  I have reviewed the above documentation for accuracy and completeness, and I agree with the above. Karie Chimera, M.D.,  Ph.D. 01/07/21 4:19 PM   Abbreviations: M myopia (nearsighted); A astigmatism; H hyperopia (farsighted); P presbyopia; Mrx spectacle prescription;  CTL contact lenses; OD right eye; OS left eye; OU both eyes  XT exotropia; ET esotropia; PEK punctate epithelial keratitis; PEE punctate epithelial erosions; DES dry eye syndrome; MGD meibomian gland dysfunction; ATs artificial tears; PFAT's preservative free artificial tears; NSC nuclear sclerotic cataract; PSC posterior subcapsular cataract; ERM epi-retinal membrane; PVD posterior vitreous detachment; RD retinal detachment; DM  diabetes mellitus; DR diabetic retinopathy; NPDR non-proliferative diabetic retinopathy; PDR proliferative diabetic retinopathy; CSME clinically significant macular edema; DME diabetic macular edema; dbh dot blot hemorrhages; CWS cotton wool spot; POAG primary open angle glaucoma; C/D cup-to-disc ratio; HVF humphrey visual field; GVF goldmann visual field; OCT optical coherence tomography; IOP intraocular pressure; BRVO Branch retinal vein occlusion; CRVO central retinal vein occlusion; CRAO central retinal artery occlusion; BRAO branch retinal artery occlusion; RT retinal tear; SB scleral buckle; PPV pars plana vitrectomy; VH Vitreous hemorrhage; PRP panretinal laser photocoagulation; IVK intravitreal kenalog; VMT vitreomacular traction; MH Macular hole;  NVD neovascularization of the disc; NVE neovascularization elsewhere; AREDS age related eye disease study; ARMD age related macular degeneration; POAG primary open angle glaucoma; EBMD epithelial/anterior basement membrane dystrophy; ACIOL anterior chamber intraocular lens; IOL intraocular lens; PCIOL posterior chamber intraocular lens; Phaco/IOL phacoemulsification with intraocular lens placement; PRK photorefractive keratectomy; LASIK laser assisted in situ keratomileusis; HTN hypertension; DM diabetes mellitus; COPD chronic obstructive pulmonary disease

## 2021-01-07 ENCOUNTER — Encounter (INDEPENDENT_AMBULATORY_CARE_PROVIDER_SITE_OTHER): Payer: Self-pay | Admitting: Ophthalmology

## 2021-01-07 ENCOUNTER — Ambulatory Visit (INDEPENDENT_AMBULATORY_CARE_PROVIDER_SITE_OTHER): Payer: Medicare Other | Admitting: Ophthalmology

## 2021-01-07 ENCOUNTER — Other Ambulatory Visit: Payer: Self-pay

## 2021-01-07 DIAGNOSIS — H353132 Nonexudative age-related macular degeneration, bilateral, intermediate dry stage: Secondary | ICD-10-CM

## 2021-01-07 DIAGNOSIS — Z961 Presence of intraocular lens: Secondary | ICD-10-CM | POA: Diagnosis not present

## 2021-01-07 DIAGNOSIS — H3581 Retinal edema: Secondary | ICD-10-CM

## 2021-01-21 ENCOUNTER — Encounter: Payer: Self-pay | Admitting: Orthopedic Surgery

## 2021-01-21 ENCOUNTER — Non-Acute Institutional Stay: Payer: Medicare Other | Admitting: Orthopedic Surgery

## 2021-01-21 ENCOUNTER — Other Ambulatory Visit: Payer: Self-pay | Admitting: Orthopedic Surgery

## 2021-01-21 DIAGNOSIS — G40822 Epileptic spasms, not intractable, without status epilepticus: Secondary | ICD-10-CM | POA: Diagnosis not present

## 2021-01-21 DIAGNOSIS — F419 Anxiety disorder, unspecified: Secondary | ICD-10-CM | POA: Diagnosis not present

## 2021-01-21 DIAGNOSIS — R7303 Prediabetes: Secondary | ICD-10-CM

## 2021-01-21 DIAGNOSIS — K5904 Chronic idiopathic constipation: Secondary | ICD-10-CM

## 2021-01-21 DIAGNOSIS — E785 Hyperlipidemia, unspecified: Secondary | ICD-10-CM

## 2021-01-21 DIAGNOSIS — G4700 Insomnia, unspecified: Secondary | ICD-10-CM

## 2021-01-21 DIAGNOSIS — K3189 Other diseases of stomach and duodenum: Secondary | ICD-10-CM

## 2021-01-21 DIAGNOSIS — M810 Age-related osteoporosis without current pathological fracture: Secondary | ICD-10-CM

## 2021-01-21 MED ORDER — CLONAZEPAM 1 MG PO TABS: 1.0000 mg | ORAL_TABLET | Freq: Every day | ORAL | 1 refills | Status: DC

## 2021-01-21 NOTE — Progress Notes (Signed)
Location:   [facility] Room Number: 914-A Place of Service:  ALF [phone]) Provider:  Windell Moulding, NP    Patient Care Team: Mast, Man X, NP as PCP - General (Internal Medicine) Mast, Man X, NP as Nurse Practitioner (Internal Medicine)  Extended Emergency Contact Information Primary Emergency Contact: Marko Stai [place] Mobile Phone: [phone] Relation: Friend  Code Status:  FULL CODE Goals of care: Advanced Directive information Advanced Directives 01/21/2021  Does Patient Have a Medical Advance Directive? Yes  Type of Paramedic of [place];Living will  Does patient want to make changes to medical advance directive? No - Patient declined  Copy of [place] in Chart? Yes - validated most recent copy scanned in chart (See row information)     Chief Complaint  Patient presents with   Acute Visit    Insomnia.    HPI:  Pt is a 80 y.o. female seen today for an acute visit for insomnia.   She currently resides on the assisted living unit at [facility]. PMH: epilepsy, osteoporosis, HLD, constipation, anxiety, right renal cyst, liver cyst.   Today, she reports being unable to sleep for 3 days. She has only slept for 1-2 hours at a time. Describes her mind is racing when trying to fall asleep. Denies anxiety or recent panic attack. Reports having television off at night. Does not look at cell phone. Tried calm music and reading without success. She also admits to counting backwards and singing songs. She is currently taking clonazepam 0.5 mg QOD and melatonin 5mg  QOD without success. She has a long history of insomnia. Failed trial of Ambien in past. She states she becomes dizzy and light headed the next day after poor sleep. Denies recent falls or injury. 11/16 she was seen by Dr. April Manson- neurology for insomnia. Referral to counseling/psychiatry recommended. 2019 she was referred to  sleep/insomnia counseling by Dr. Jaynee Eagles, she did not follow up.   Seizures- no recent seizures, remains on Tegretol Constipation- LBM 12/04, remains on Golytely prn Erosive gastropathy- hgb 12.9 12/21/2020, avoids food triggers, not on medication HLD- LDL 79 08/15/2020, remains on Lipitor Osteoporosis- t score -3.6 2021, taking Reclast Prediabetes- A1c 6.0 08/15/2020    Past Medical History:  Diagnosis Date   Anxiety    Cataract    Chronic constipation    Depression    Osteoporosis    Seizures ([place])    epilepsy  last seizure 1977   Past Surgical History:  Procedure Laterality Date   ABDOMINAL HYSTERECTOMY  1995   Dr. Edwyna Shell   CATARACT EXTRACTION Bilateral 12/2016   COLONOSCOPY  2012   patchy increased intraepithelial lymphocytes - draelos   ESOPHAGOGASTRODUODENOSCOPY     multiple   FISSURECTOMY     WRIST SURGERY Left    due to fracture    Allergies  Allergen Reactions   Cat Hair Extract Other (See Comments)    Patient states that it causes her eyes to swell shut   Cefaclor Other (See Comments)    unknown   Codeine Nausea Only   Iodinated Diagnostic Agents Other (See Comments)    Shrimp caused stomach pain and nausea Per patient can eat fried oysters okay     Other Nausea Only    Unknown allergy Pain medications, Pt states all kinds of pain medications she is unable to take except tylenol   Oxycodone-Acetaminophen Nausea And Vomiting   Penicillins Itching    Allergies as  of 01/21/2021       Reactions   Cat Hair Extract Other (See Comments)   Patient states that it causes her eyes to swell shut   Cefaclor Other (See Comments)   unknown   Codeine Nausea Only   Iodinated Diagnostic Agents Other (See Comments)   Shrimp caused stomach pain and nausea Per patient can eat fried oysters okay   Other Nausea Only   Unknown allergy Pain medications, Pt states all kinds of pain medications she is unable to take except tylenol   Oxycodone-acetaminophen Nausea  And Vomiting   Penicillins Itching        Medication List        Accurate as of January 21, 2021  4:04 PM. If you have any questions, ask your nurse or doctor.          STOP taking these medications    Golytely 236 g solution Generic drug: polyethylene glycol Stopped by: Octavia Heir, NP   zolpidem 5 MG tablet Commonly known as: AMBIEN Stopped by: Octavia Heir, NP       TAKE these medications    atorvastatin 10 MG tablet Commonly known as: LIPITOR TAKE (1/2) TABLET DAILY.   CALTRATE 600+D PO Take by mouth in the morning and at bedtime.   clonazePAM 1 MG tablet Commonly known as: KLONOPIN Take 1 tablet (1 mg total) by mouth at bedtime. What changed:  medication strength how much to take when to take this reasons to take this Changed by: Octavia Heir, NP   melatonin 5 MG Tabs Take 5 mg by mouth every other day.   NUTRITIONAL SUPPLEMENT PO Take 420 g by mouth daily. Peg-Electrolyte Soln   PRESERVISION AREDS PO Take 2 tablets by mouth.   TEGretol 200 MG tablet Generic drug: carbamazepine TAKE 1&1/2 TABLETS ONCE DAILY.   Vitamin D (Ergocalciferol) 1.25 MG (50000 UNIT) Caps capsule Commonly known as: DRISDOL TAKE 1 CAPSULE WEEKLY.        Review of Systems  Constitutional:  Positive for fatigue. Negative for activity change, appetite change, chills, diaphoresis and fever.  HENT:  Negative for congestion and trouble swallowing.   Eyes:  Negative for visual disturbance.  Respiratory:  Negative for cough, shortness of breath and wheezing.   Cardiovascular:  Negative for chest pain and leg swelling.  Gastrointestinal:  Positive for constipation. Negative for abdominal distention, abdominal pain, blood in stool, diarrhea, nausea, rectal pain and vomiting.  Genitourinary:  Negative for dysuria, frequency, hematuria and urgency.  Musculoskeletal:  Positive for gait problem. Negative for arthralgias and myalgias.  Skin: Negative.   Neurological:  Positive  for dizziness, weakness and light-headedness.  Psychiatric/Behavioral:  Positive for sleep disturbance. Negative for confusion, dysphoric mood and suicidal ideas. The patient is nervous/anxious.    Immunization History  Administered Date(s) Administered   Fluad Quad(high Dose 65+) 12/03/2020   Influenza Whole 11/19/2017   Influenza, High Dose Seasonal PF 11/26/2016, 12/01/2018, 11/30/2019   Influenza-Unspecified 11/18/2014, 11/29/2015   Moderna Sars-Covid-2 Vaccination 02/21/2019, 03/21/2019, 06/26/2020   Pneumococcal Conjugate-13 09/24/2017   Pneumococcal Polysaccharide-23 12/30/2012, 09/18/2018   Tdap 08/20/2017   Zoster Recombinat (Shingrix) 07/21/2017, 12/08/2017   Zoster, Live 05/28/2007   Pertinent  Health Maintenance Due  Topic Date Due   FOOT EXAM  Never done   OPHTHALMOLOGY EXAM  Never done   MAMMOGRAM  08/13/2017   HEMOGLOBIN A1C  02/14/2021   URINE MICROALBUMIN  04/19/2021   INFLUENZA VACCINE  Completed   DEXA SCAN  Completed  Fall Risk 08/22/2020 09/13/2020 10/18/2020 11/20/2020 12/13/2020  Falls in the past year? 0 0 0 0 0  Was there an injury with Fall? 0 0 0 0 0  Fall Risk Category Calculator 0 0 0 0 0  Fall Risk Category Low Low Low Low Low  Patient Fall Risk Level Low fall risk Low fall risk Low fall risk Low fall risk Low fall risk  Patient at Risk for Falls Due to No Fall Risks No Fall Risks No Fall Risks No Fall Risks -  Fall risk Follow up Education provided;Falls prevention discussed;Falls evaluation completed Falls evaluation completed Falls evaluation completed Falls evaluation completed Falls evaluation completed   Functional Status Survey:    Vitals:   01/21/21 1555  BP: 132/84  Pulse: 78  Resp: 16  Temp: (!) 97.3 F (36.3 C)  SpO2: 97%  Weight: 102 lb 3.2 oz (46.4 kg)  Height: 5\' 7"  (1.702 m)   Body mass index is 16.01 kg/m. Physical Exam Vitals reviewed.  Constitutional:      General: She is not in acute distress. HENT:     Head:  Normocephalic.  Eyes:     General:        Right eye: No discharge.        Left eye: No discharge.  Neck:     Vascular: No carotid bruit.  Cardiovascular:     Rate and Rhythm: Normal rate and regular rhythm.     Pulses: Normal pulses.     Heart sounds: Normal heart sounds. No murmur heard. Pulmonary:     Effort: Pulmonary effort is normal. No respiratory distress.     Breath sounds: Normal breath sounds. No wheezing.  Abdominal:     General: Bowel sounds are normal. There is no distension.     Palpations: Abdomen is soft.     Tenderness: There is no abdominal tenderness.  Musculoskeletal:     Cervical back: Normal range of motion.     Right lower leg: No edema.     Left lower leg: No edema.  Lymphadenopathy:     Cervical: No cervical adenopathy.  Skin:    General: Skin is warm and dry.     Capillary Refill: Capillary refill takes less than 2 seconds.  Neurological:     General: No focal deficit present.     Mental Status: She is alert and oriented to person, place, and time.     Motor: Weakness present.     Gait: Gait abnormal.  Psychiatric:        Mood and Affect: Mood is anxious.        Behavior: Behavior normal. Behavior is cooperative.     Comments: Many concepts repeated to patient during encounter     Labs reviewed: Recent Labs    08/15/20 0813 12/21/20 0000  NA 138 140  K 4.1 4.0  CL 100 105  CO2 30 30*  GLUCOSE 100*  --   BUN 16 21  CREATININE 0.61 0.7  CALCIUM 9.2 9.4   Recent Labs    08/15/20 0813 12/21/20 0000  AST 18 17  ALT 16 16  ALKPHOS  --  38  BILITOT 0.5  --   PROT 6.2  --   ALBUMIN  --  3.8   Recent Labs    08/15/20 0813 12/21/20 0000  WBC 5.4 5.8  NEUTROABS 3,186  --   HGB 14.0 12.9  HCT 42.2 38  MCV 95.5  --   PLT 211 175  Lab Results  Component Value Date   TSH 3.20 12/21/2020   Lab Results  Component Value Date   HGBA1C 6.0 (H) 08/15/2020   Lab Results  Component Value Date   CHOL 153 08/15/2020   HDL 48 (L)  08/15/2020   LDLCALC 79 08/15/2020   TRIG 159 (H) 08/15/2020   CHOLHDL 3.2 08/15/2020    Significant Diagnostic Results in last 30 days:  OCT, Retina - OU - Both Eyes  Result Date: 01/07/2021 Right Eye Quality was good. Central Foveal Thickness: 304. Progression has no prior data. Findings include normal foveal contour, no IRF, no SRF, retinal drusen , pigment epithelial detachment. Left Eye Quality was good. Central Foveal Thickness: 325. Progression has no prior data. Findings include normal foveal contour, no IRF, no SRF, retinal drusen , pigment epithelial detachment (Prominent PED centrally). Notes *Images captured and stored on drive Diagnosis / Impression: Non-exu ARMD OU - prominent PEDs OU Clinical management: See below Abbreviations: NFP - Normal foveal profile. CME - cystoid macular edema. PED - pigment epithelial detachment. IRF - intraretinal fluid. SRF - subretinal fluid. EZ - ellipsoid zone. ERM - epiretinal membrane. ORA - outer retinal atrophy. ORT - outer retinal tubulation. SRHM - subretinal hyper-reflective material. IRHM - intraretinal hyper-reflective material    Assessment/Plan 1. Insomnia, unspecified type - ongoing, averaging 1-2 hours/day x 3 days - failed trial of Ambien in past - seen by neuro 11/16- counseling/psych recommended - suspect higher dose of clonazepam needed - recommend clonazepam 1 mg po Qhs  - advised nursing staff to f/u on psych consult  2. Nonintractable epileptic spasms without status epilepticus ([place]) - followed by neurology - no recent seizures - cont Tegretol  3. Chronic idiopathic constipation - LBM 12/04, abdomen soft - cont Golytely prn  4. Erosive gastropathy - hgb stable - not on meds - cont to avoid food triggers  5. Hyperlipidemia, unspecified hyperlipidemia type - LDL stable - cont statin  6. Osteoporosis, unspecified osteoporosis type, unspecified pathological fracture presence - t score -3.6 2021 - cont Reclast  7.  Pre-diabetes - A1c 6.0 07/2020 - cont to monitor carbs and sugars in diet  8. Anxiety - appears anxious today - denies anxiety or panic attacks - suspect lack of sleep - advised nurses to f/u with psych consult  Family/ staff Communication: plan discussed with patient and nurse  Labs/tests ordered: none

## 2021-01-22 ENCOUNTER — Ambulatory Visit: Payer: Medicare Other | Admitting: Neurology

## 2021-01-25 ENCOUNTER — Encounter: Payer: Self-pay | Admitting: Nurse Practitioner

## 2021-01-25 ENCOUNTER — Non-Acute Institutional Stay: Payer: Medicare Other | Admitting: Nurse Practitioner

## 2021-01-25 DIAGNOSIS — E785 Hyperlipidemia, unspecified: Secondary | ICD-10-CM

## 2021-01-25 DIAGNOSIS — F5101 Primary insomnia: Secondary | ICD-10-CM

## 2021-01-25 DIAGNOSIS — G40822 Epileptic spasms, not intractable, without status epilepticus: Secondary | ICD-10-CM

## 2021-01-25 DIAGNOSIS — M810 Age-related osteoporosis without current pathological fracture: Secondary | ICD-10-CM

## 2021-01-25 DIAGNOSIS — K3189 Other diseases of stomach and duodenum: Secondary | ICD-10-CM | POA: Diagnosis not present

## 2021-01-25 DIAGNOSIS — K5904 Chronic idiopathic constipation: Secondary | ICD-10-CM | POA: Diagnosis not present

## 2021-01-25 DIAGNOSIS — R7303 Prediabetes: Secondary | ICD-10-CM | POA: Diagnosis not present

## 2021-01-25 NOTE — Progress Notes (Signed)
Location:   [facility] Room Number: [phone] A Place of Service:  ALF (13) Provider:  Tyana Butzer X, NP  Shuntel Fishburn X, NP  Patient Care Team: Kevin Space X, NP as PCP - General (Internal Medicine) Valine Drozdowski X, NP as Nurse Practitioner (Internal Medicine)  Extended Emergency Contact Information Primary Emergency Contact: Charlies Constable [place] of [place] Mobile Phone: [phone] Relation: Friend  Code Status:  FULL CODE Goals of care: Advanced Directive information Advanced Directives 01/25/2021  Does Patient Have a Medical Advance Directive? Yes  Type of Estate agent of [place];Living will  Does patient want to make changes to medical advance directive? No - Patient declined  Copy of Healthcare Power of Attorney in Chart? Yes - validated most recent copy scanned in chart (See row information)     Chief Complaint  Patient presents with   Acute Visit    Acute visit for Medication review    HPI:  Pt is a 80 y.o. female seen today for an acute visit for insomnia, sleeps better after increased Clonazepam 1mg .    Seizures, no active seizures since last seen, on Tegretol 300mg  qd, Clonazepam hs (GDR to stop on her own didn't cause active seizures, but flare up insomnia and anxiety). S/p Neurology              Constipation, stable, on Golytely prn, Gavilyte prn.              Hyperlipidemia, takes Atorvastatin 5mg  qd, LDL 79 08/15/20             Prediabetic, Hgb a1c 6.0 08/16/20             OP takes Reclast, t-score -3.6 09/13/19             Insomnia, takes Clonazepam, failed Ambien. TSH 3.2 12/21/20             Erosive gastropathy, not taking PPI, Hgb 12.9 12/21/20,  Diet controls.  Past Medical History:  Diagnosis Date   Anxiety    Cataract    Chronic constipation    Depression    Osteoporosis    Seizures ([name])    epilepsy  last seizure 1977   Past Surgical History:  Procedure Laterality Date   ABDOMINAL HYSTERECTOMY  1995   Dr.  13/4/22   CATARACT EXTRACTION Bilateral 12/2016   COLONOSCOPY  2012   patchy increased intraepithelial lymphocytes - draelos   ESOPHAGOGASTRODUODENOSCOPY     multiple   FISSURECTOMY     WRIST SURGERY Left    due to fracture    Allergies  Allergen Reactions   Cat Hair Extract Other (See Comments)    Patient states that it causes her eyes to swell shut   Cefaclor Other (See Comments)    unknown   Codeine Nausea Only   Iodinated Diagnostic Agents Other (See Comments)    Shrimp caused stomach pain and nausea Per patient can eat fried oysters okay     Other Nausea Only    Unknown allergy Pain medications, Pt states all kinds of pain medications she is unable to take except tylenol   Oxycodone-Acetaminophen Nausea And Vomiting   Penicillins Itching    Allergies as of 01/25/2021       Reactions   Cat Hair Extract Other (See Comments)   Patient states that it causes her eyes to swell shut   Cefaclor Other (See Comments)   unknown   Codeine Nausea Only  Iodinated Diagnostic Agents Other (See Comments)   Shrimp caused stomach pain and nausea Per patient can eat fried oysters okay   Other Nausea Only   Unknown allergy Pain medications, Pt states all kinds of pain medications she is unable to take except tylenol   Oxycodone-acetaminophen Nausea And Vomiting   Penicillins Itching        Medication List        Accurate as of January 25, 2021 11:59 PM. If you have any questions, ask your nurse or doctor.          atorvastatin 10 MG tablet Commonly known as: LIPITOR TAKE (1/2) TABLET DAILY.   CALTRATE 600+D PO Take by mouth in the morning and at bedtime.   clonazePAM 1 MG tablet Commonly known as: KLONOPIN Take 1 tablet (1 mg total) by mouth at bedtime.   melatonin 5 MG Tabs Take 5 mg by mouth every other day.   NUTRITIONAL SUPPLEMENT PO Take 420 g by mouth daily. Peg-Electrolyte Soln   PRESERVISION AREDS PO Take 2 tablets by mouth.   TEGretol 200  MG tablet Generic drug: carbamazepine TAKE 1&1/2 TABLETS ONCE DAILY.   Vitamin D (Ergocalciferol) 1.25 MG (50000 UNIT) Caps capsule Commonly known as: DRISDOL TAKE 1 CAPSULE WEEKLY.        Review of Systems  Constitutional:  Negative for appetite change, fatigue and fever.  HENT:  Positive for hearing loss. Negative for congestion and voice change.   Eyes:  Negative for visual disturbance.  Respiratory:  Negative for shortness of breath.   Cardiovascular:  Negative for leg swelling.  Gastrointestinal:  Negative for abdominal pain and constipation.  Genitourinary:  Positive for frequency. Negative for dysuria and urgency.       The patient stated she pushes her suprapubic region to help emptying her bladder  Musculoskeletal:  Positive for gait problem.       Mid back pain. Left hip/leg pain  Skin:  Negative for color change.  Neurological:  Positive for numbness. Negative for seizures and weakness.       Tingling/numbness left foot sometimes. Occasionally left hip/leg pain sometimes. Lightheaded when not sleeping well in lifetime.   Psychiatric/Behavioral:  Positive for sleep disturbance. Negative for behavioral problems. The patient is nervous/anxious.        Feels lightheaded if not sleep well at night.    Immunization History  Administered Date(s) Administered   Fluad Quad(high Dose 65+) 12/03/2020   Influenza Whole 11/19/2017   Influenza, High Dose Seasonal PF 11/26/2016, 12/01/2018, 11/30/2019   Influenza-Unspecified 11/18/2014, 11/29/2015   Moderna Sars-Covid-2 Vaccination 02/21/2019, 03/21/2019, 06/26/2020   Pneumococcal Conjugate-13 09/24/2017   Pneumococcal Polysaccharide-23 12/30/2012, 09/18/2018   Tdap 08/20/2017   Zoster Recombinat (Shingrix) 07/21/2017, 12/08/2017   Zoster, Live 05/28/2007   Pertinent  Health Maintenance Due  Topic Date Due   FOOT EXAM  Never done   OPHTHALMOLOGY EXAM  Never done   MAMMOGRAM  08/13/2017   HEMOGLOBIN A1C  02/14/2021   URINE  MICROALBUMIN  04/19/2021   INFLUENZA VACCINE  Completed   DEXA SCAN  Completed   Fall Risk 08/22/2020 09/13/2020 10/18/2020 11/20/2020 12/13/2020  Falls in the past year? 0 0 0 0 0  Was there an injury with Fall? 0 0 0 0 0  Fall Risk Category Calculator 0 0 0 0 0  Fall Risk Category Low Low Low Low Low  Patient Fall Risk Level Low fall risk Low fall risk Low fall risk Low fall risk Low fall risk  Patient at Risk for Falls Due to No Fall Risks No Fall Risks No Fall Risks No Fall Risks -  Fall risk Follow up Education provided;Falls prevention discussed;Falls evaluation completed Falls evaluation completed Falls evaluation completed Falls evaluation completed Falls evaluation completed   Functional Status Survey:    Vitals:   01/25/21 1100  BP: (!) 110/58  Pulse: 94  Resp: 20  Temp: 97.9 F (36.6 C)  SpO2: 97%  Weight: 105 lb 6.4 oz (47.8 kg)  Height:  (1.702 m)   Body mass index is 16.51 kg/m. Physical Exam Vitals and nursing note reviewed.  Constitutional:      Appearance: Normal appearance.  HENT:     Head: Normocephalic and atraumatic.     Mouth/Throat:     Mouth: Mucous membranes are moist.  Eyes:     Extraocular Movements: Extraocular movements intact.     Conjunctiva/sclera: Conjunctivae normal.     Pupils: Pupils are equal, round, and reactive to light.  Cardiovascular:     Rate and Rhythm: Normal rate and regular rhythm.     Heart sounds: No murmur heard. Pulmonary:     Effort: Pulmonary effort is normal.     Breath sounds: No rales.  Abdominal:     General: Bowel sounds are normal.     Palpations: Abdomen is soft.     Tenderness: There is no abdominal tenderness.  Musculoskeletal:     Cervical back: Normal range of motion and neck supple.     Right lower leg: No edema.     Left lower leg: No edema.  Skin:    General: Skin is warm and dry.  Neurological:     General: No focal deficit present.     Mental Status: She is alert and oriented to person,  place, and time. Mental status is at baseline.     Gait: Gait normal.  Psychiatric:     Comments: Appears anxious, repetitive, obsessed with her sleep    Labs reviewed: Recent Labs    08/15/20 0813 12/21/20 0000  NA 138 140  K 4.1 4.0  CL 100 105  CO2 30 30*  GLUCOSE 100*  --   BUN 16 21  CREATININE 0.61 0.7  CALCIUM 9.2 9.4   Recent Labs    08/15/20 0813 12/21/20 0000  AST 18 17  ALT 16 16  ALKPHOS  --  38  BILITOT 0.5  --   PROT 6.2  --   ALBUMIN  --  3.8   Recent Labs    08/15/20 0813 12/21/20 0000  WBC 5.4 5.8  NEUTROABS 3,186  --   HGB 14.0 12.9  HCT 42.2 38  MCV 95.5  --   PLT 211 175   Lab Results  Component Value Date   TSH 3.20 12/21/2020   Lab Results  Component Value Date   HGBA1C 6.0 (H) 08/15/2020   Lab Results  Component Value Date   CHOL 153 08/15/2020   HDL 48 (L) 08/15/2020   LDLCALC 79 08/15/2020   TRIG 159 (H) 08/15/2020   CHOLHDL 3.2 08/15/2020    Significant Diagnostic Results in last 30 days:  OCT, Retina - OU - Both Eyes  Result Date: 01/07/2021 Right Eye Quality was good. Central Foveal Thickness: 304. Progression has no prior data. Findings include normal foveal contour, no IRF, no SRF, retinal drusen , pigment epithelial detachment. Left Eye Quality was good. Central Foveal Thickness: 325. Progression has no prior data. Findings include normal foveal contour, no  IRF, no SRF, retinal drusen , pigment epithelial detachment (Prominent PED centrally). Notes *Images captured and stored on drive Diagnosis / Impression: Non-exu ARMD OU - prominent PEDs OU Clinical management: See below Abbreviations: NFP - Normal foveal profile. CME - cystoid macular edema. PED - pigment epithelial detachment. IRF - intraretinal fluid. SRF - subretinal fluid. EZ - ellipsoid zone. ERM - epiretinal membrane. ORA - outer retinal atrophy. ORT - outer retinal tubulation. SRHM - subretinal hyper-reflective material. IRHM - intraretinal hyper-reflective  material    Assessment/Plan Insomnia sleeps better after increased Clonazepam, failed Ambien. TSH 3.2 12/21/20  Epilepsy without status epilepticus, not intractable ([name]) no active seizures since last seen, on Tegretol 300mg  qd, Clonazepam hs (GDR to stop on her own didn't cause active seizures, but flare up insomnia and anxiety). S/p Neurology   Constipation stable, on Golytely prn, Gavilyte prn.   Hyperlipidemia takes Atorvastatin 5mg  qd, LDL 79 08/15/20  Pre-diabetes Hgb a1c 6.0 08/16/20  Osteoporosis  takes Reclast, t-score -3.6 09/13/19  Erosive gastropathy  not taking PPI, Hgb 12.9 12/21/20,  Diet controls.    Family/ staff Communication: plan of care reviewed   Labs/tests ordered:   none  Time spend 40 minutes.

## 2021-01-28 ENCOUNTER — Encounter: Payer: Self-pay | Admitting: Nurse Practitioner

## 2021-01-28 ENCOUNTER — Non-Acute Institutional Stay: Payer: Medicare Other | Admitting: Nurse Practitioner

## 2021-01-28 DIAGNOSIS — G40822 Epileptic spasms, not intractable, without status epilepticus: Secondary | ICD-10-CM

## 2021-01-28 DIAGNOSIS — K3189 Other diseases of stomach and duodenum: Secondary | ICD-10-CM

## 2021-01-28 DIAGNOSIS — E785 Hyperlipidemia, unspecified: Secondary | ICD-10-CM | POA: Diagnosis not present

## 2021-01-28 DIAGNOSIS — M810 Age-related osteoporosis without current pathological fracture: Secondary | ICD-10-CM | POA: Diagnosis not present

## 2021-01-28 DIAGNOSIS — F5101 Primary insomnia: Secondary | ICD-10-CM

## 2021-01-28 DIAGNOSIS — K5904 Chronic idiopathic constipation: Secondary | ICD-10-CM

## 2021-01-28 DIAGNOSIS — R7303 Prediabetes: Secondary | ICD-10-CM | POA: Diagnosis not present

## 2021-01-28 NOTE — Assessment & Plan Note (Addendum)
sleeps better after increased Clonazepam, failed Ambien. TSH 3.2 12/21/20

## 2021-01-28 NOTE — Progress Notes (Signed)
Location:   [facility] Room Number: [phone] A Place of Service:  ALF (13) Provider:  Shirlene Andaya X, NP  Katrell Milhorn X, NP  Patient Care Team: Rebbie Lauricella X, NP as PCP - General (Internal Medicine) Tamecka Milham X, NP as Nurse Practitioner (Internal Medicine)  Extended Emergency Contact Information Primary Emergency Contact: Charlies Constable [place] of [place] Mobile Phone: [phone] Relation: Friend  Code Status:  FULL CODE Goals of care: Advanced Directive information Advanced Directives 01/28/2021  Does Patient Have a Medical Advance Directive? Yes  Type of Estate agent of [place];Living will  Does patient want to make changes to medical advance directive? No - Patient declined  Copy of Healthcare Power of Attorney in Chart? Yes - validated most recent copy scanned in chart (See row information)     Chief Complaint  Patient presents with   Acute Visit    Acute visit for Insomnia    HPI:  Pt is a 80 y.o. female seen today for an acute visit for Insomnia, Clonazepam 1mg  hs made her feel drowsy in am, but sleeps well. Spkoe with the patient to try Clonazepam earlier then observe.     Seizures, no active seizures since last seen, on Tegretol 300mg  qd, Clonazepam hs (GDR to stop on her own didn't cause active seizures, but flare up insomnia and anxiety). S/p Neurology              Constipation, stable, on Golytely prn, Gavilyte prn.              Hyperlipidemia, takes Atorvastatin 5mg  qd, LDL 79 08/15/20             Prediabetic, Hgb a1c 6.0 08/16/20             OP takes Reclast, t-score -3.6 09/13/19             Insomnia, takes Clonazepam, failed GDR,  failed Ambien. TSH 3.2 12/21/20             Erosive gastropathy, not taking PPI, Hgb 12.9 12/21/20,  Diet controls.  Past Medical History:  Diagnosis Date   Anxiety    Cataract    Chronic constipation    Depression    Osteoporosis    Seizures ([name])    epilepsy  last seizure 1977    Past Surgical History:  Procedure Laterality Date   ABDOMINAL HYSTERECTOMY  1995   Dr. 13/4/22   CATARACT EXTRACTION Bilateral 12/2016   COLONOSCOPY  2012   patchy increased intraepithelial lymphocytes - draelos   ESOPHAGOGASTRODUODENOSCOPY     multiple   FISSURECTOMY     WRIST SURGERY Left    due to fracture    Allergies  Allergen Reactions   Cat Hair Extract Other (See Comments)    Patient states that it causes her eyes to swell shut   Cefaclor Other (See Comments)    unknown   Codeine Nausea Only   Iodinated Diagnostic Agents Other (See Comments)    Shrimp caused stomach pain and nausea Per patient can eat fried oysters okay     Other Nausea Only    Unknown allergy Pain medications, Pt states all kinds of pain medications she is unable to take except tylenol   Oxycodone-Acetaminophen Nausea And Vomiting   Penicillins Itching    Allergies as of 01/28/2021       Reactions   Cat Hair Extract Other (See Comments)   Patient states that it causes her eyes  to swell shut   Cefaclor Other (See Comments)   unknown   Codeine Nausea Only   Iodinated Diagnostic Agents Other (See Comments)   Shrimp caused stomach pain and nausea Per patient can eat fried oysters okay   Other Nausea Only   Unknown allergy Pain medications, Pt states all kinds of pain medications she is unable to take except tylenol   Oxycodone-acetaminophen Nausea And Vomiting   Penicillins Itching        Medication List        Accurate as of January 28, 2021 11:59 PM. If you have any questions, ask your nurse or doctor.          STOP taking these medications    melatonin 5 MG Tabs Stopped by: Soua Caltagirone X Trysten Bernard, NP       TAKE these medications    atorvastatin 10 MG tablet Commonly known as: LIPITOR TAKE (1/2) TABLET DAILY.   CALTRATE 600+D PO Take by mouth in the morning and at bedtime.   clonazePAM 1 MG tablet Commonly known as: KLONOPIN Take 1 tablet (1 mg total) by mouth at  bedtime. What changed: additional instructions   NUTRITIONAL SUPPLEMENT PO Take 420 g by mouth daily. Peg-Electrolyte Soln   PRESERVISION AREDS PO Take 2 tablets by mouth.   TEGretol 200 MG tablet Generic drug: carbamazepine TAKE 1&1/2 TABLETS ONCE DAILY.   Vitamin D (Ergocalciferol) 1.25 MG (50000 UNIT) Caps capsule Commonly known as: DRISDOL TAKE 1 CAPSULE WEEKLY.        Review of Systems  Constitutional:  Negative for appetite change, fatigue and fever.  HENT:  Positive for hearing loss. Negative for congestion and voice change.   Eyes:  Negative for visual disturbance.  Respiratory:  Negative for shortness of breath.   Cardiovascular:  Negative for leg swelling.  Gastrointestinal:  Negative for abdominal pain and constipation.  Genitourinary:  Positive for frequency. Negative for dysuria and urgency.       The patient stated she pushes her suprapubic region to help emptying her bladder  Musculoskeletal:  Positive for gait problem.       Mid back pain. Left hip/leg pain  Skin:  Negative for color change.  Neurological:  Positive for numbness. Negative for seizures and weakness.       Tingling/numbness left foot sometimes. Occasionally left hip/leg pain sometimes. Lightheaded when not sleeping well in lifetime.   Psychiatric/Behavioral:  Positive for sleep disturbance. Negative for behavioral problems. The patient is nervous/anxious.        Feels lightheaded if not sleep well at night. Feels sleepy in am since Clonazepam  qhs, but sleeps well.    Immunization History  Administered Date(s) Administered   Fluad Quad(high Dose 65+) 12/03/2020   Influenza Whole 11/19/2017   Influenza, High Dose Seasonal PF 11/26/2016, 12/01/2018, 11/30/2019   Influenza-Unspecified 11/18/2014, 11/29/2015   Moderna Sars-Covid-2 Vaccination 02/21/2019, 03/21/2019, 06/26/2020   Pneumococcal Conjugate-13 09/24/2017   Pneumococcal Polysaccharide-23 12/30/2012, 09/18/2018   Tdap 08/20/2017    Zoster Recombinat (Shingrix) 07/21/2017, 12/08/2017   Zoster, Live 05/28/2007   Pertinent  Health Maintenance Due  Topic Date Due   FOOT EXAM  Never done   OPHTHALMOLOGY EXAM  Never done   MAMMOGRAM  08/13/2017   HEMOGLOBIN A1C  02/14/2021   URINE MICROALBUMIN  04/19/2021   INFLUENZA VACCINE  Completed   DEXA SCAN  Completed   Fall Risk 08/22/2020 09/13/2020 10/18/2020 11/20/2020 12/13/2020  Falls in the past year? 0 0 0 0 0  Was there  an injury with Fall? 0 0 0 0 0  Fall Risk Category Calculator 0 0 0 0 0  Fall Risk Category Low Low Low Low Low  Patient Fall Risk Level Low fall risk Low fall risk Low fall risk Low fall risk Low fall risk  Patient at Risk for Falls Due to No Fall Risks No Fall Risks No Fall Risks No Fall Risks -  Fall risk Follow up Education provided;Falls prevention discussed;Falls evaluation completed Falls evaluation completed Falls evaluation completed Falls evaluation completed Falls evaluation completed   Functional Status Survey:    Vitals:   01/28/21 1604  BP: (!) 110/58  Pulse: 94  Resp: 20  Temp: 98.2 F (36.8 C)  SpO2: 97%  Weight: 105 lb 6.4 oz (47.8 kg)  Height: 5\' 7"  (1.702 m)   Body mass index is 16.51 kg/m. Physical Exam Vitals and nursing note reviewed.  Constitutional:      Appearance: Normal appearance.  HENT:     Head: Normocephalic and atraumatic.     Mouth/Throat:     Mouth: Mucous membranes are moist.  Eyes:     Extraocular Movements: Extraocular movements intact.     Conjunctiva/sclera: Conjunctivae normal.     Pupils: Pupils are equal, round, and reactive to light.  Cardiovascular:     Rate and Rhythm: Normal rate and regular rhythm.     Heart sounds: No murmur heard. Pulmonary:     Effort: Pulmonary effort is normal.     Breath sounds: No rales.  Abdominal:     General: Bowel sounds are normal.     Palpations: Abdomen is soft.     Tenderness: There is no abdominal tenderness.  Musculoskeletal:     Cervical back: Normal  range of motion and neck supple.     Right lower leg: No edema.     Left lower leg: No edema.  Skin:    General: Skin is warm and dry.  Neurological:     General: No focal deficit present.     Mental Status: She is alert and oriented to person, place, and time. Mental status is at baseline.     Gait: Gait normal.  Psychiatric:     Comments: Appears anxious, repetitive, obsessed with her sleep    Labs reviewed: Recent Labs    08/15/20 0813 12/21/20 0000  NA 138 140  K 4.1 4.0  CL 100 105  CO2 30 30*  GLUCOSE 100*  --   BUN 16 21  CREATININE 0.61 0.7  CALCIUM 9.2 9.4   Recent Labs    08/15/20 0813 12/21/20 0000  AST 18 17  ALT 16 16  ALKPHOS  --  38  BILITOT 0.5  --   PROT 6.2  --   ALBUMIN  --  3.8   Recent Labs    08/15/20 0813 12/21/20 0000  WBC 5.4 5.8  NEUTROABS 3,186  --   HGB 14.0 12.9  HCT 42.2 38  MCV 95.5  --   PLT 211 175   Lab Results  Component Value Date   TSH 3.20 12/21/2020   Lab Results  Component Value Date   HGBA1C 6.0 (H) 08/15/2020   Lab Results  Component Value Date   CHOL 153 08/15/2020   HDL 48 (L) 08/15/2020   LDLCALC 79 08/15/2020   TRIG 159 (H) 08/15/2020   CHOLHDL 3.2 08/15/2020    Significant Diagnostic Results in last 30 days:  OCT, Retina - OU - Both Eyes  Result Date: 01/07/2021  Right Eye Quality was good. Central Foveal Thickness: 304. Progression has no prior data. Findings include normal foveal contour, no IRF, no SRF, retinal drusen , pigment epithelial detachment. Left Eye Quality was good. Central Foveal Thickness: 325. Progression has no prior data. Findings include normal foveal contour, no IRF, no SRF, retinal drusen , pigment epithelial detachment (Prominent PED centrally). Notes *Images captured and stored on drive Diagnosis / Impression: Non-exu ARMD OU - prominent PEDs OU Clinical management: See below Abbreviations: NFP - Normal foveal profile. CME - cystoid macular edema. PED - pigment epithelial  detachment. IRF - intraretinal fluid. SRF - subretinal fluid. EZ - ellipsoid zone. ERM - epiretinal membrane. ORA - outer retinal atrophy. ORT - outer retinal tubulation. SRHM - subretinal hyper-reflective material. IRHM - intraretinal hyper-reflective material    Assessment/Plan Insomnia Clonazepam 1mg  hs made her feel drowsy in am, but sleeps well. Spkoe with the patient to try Clonazepam earlier then observe.  04/05/20 the patient sleeps well, no apparent am drowsiness, will continue Clonazepam earlier in pm. Observe.   Epilepsy without status epilepticus, not intractable ([name]) seizures, no active seizures since last seen, on Tegretol 300mg  qd, Clonazepam hs (GDR to stop on her own didn't cause active seizures, but flare up insomnia and anxiety). S/p Neurology   Constipation  stable, on Golytely prn, Gavilyte prn.   Hyperlipidemia  takes Atorvastatin 5mg  qd, LDL 79 08/15/20  Pre-diabetes Hgb a1c 6.0 08/16/20  Osteoporosis  takes Reclast, t-score -3.6 09/13/19  Erosive gastropathy not taking PPI, Hgb 12.9 12/21/20,  Diet controls.     Family/ staff Communication: plan of care reviewed with the patient and charge nurse.   Labs/tests ordered:   none  Time spend 40 minutes

## 2021-01-28 NOTE — Assessment & Plan Note (Signed)
takes Atorvastatin 5mg qd, LDL 79 08/15/20 

## 2021-01-28 NOTE — Assessment & Plan Note (Signed)
not taking PPI, Hgb 12.9 12/21/20,  Diet controls.

## 2021-01-28 NOTE — Assessment & Plan Note (Signed)
stable, on Golytely prn, Gavilyte prn.

## 2021-01-28 NOTE — Assessment & Plan Note (Signed)
Hgb a1c 6.0 08/16/20

## 2021-01-28 NOTE — Assessment & Plan Note (Signed)
takes Reclast, t-score -3.6 09/13/19 

## 2021-01-28 NOTE — Assessment & Plan Note (Signed)
no active seizures since last seen, on Tegretol 300mg  qd, Clonazepam hs (GDR to stop on her own didn't cause active seizures, but flare up insomnia and anxiety). S/p Neurology

## 2021-02-01 ENCOUNTER — Encounter: Payer: Self-pay | Admitting: Nurse Practitioner

## 2021-02-02 ENCOUNTER — Encounter: Payer: Self-pay | Admitting: Nurse Practitioner

## 2021-02-02 DIAGNOSIS — Z20828 Contact with and (suspected) exposure to other viral communicable diseases: Secondary | ICD-10-CM | POA: Diagnosis not present

## 2021-02-02 NOTE — Assessment & Plan Note (Signed)
seizures, no active seizures since last seen, on Tegretol 300mg  qd, Clonazepam hs (GDR to stop on her own didn't cause active seizures, but flare up insomnia and anxiety). S/p Neurology

## 2021-02-02 NOTE — Assessment & Plan Note (Signed)
not taking PPI, Hgb 12.9 12/21/20,  Diet controls. 

## 2021-02-02 NOTE — Assessment & Plan Note (Addendum)
Clonazepam 1mg  hs made her feel drowsy in am, but sleeps well. Spkoe with the patient to try Clonazepam earlier then observe.  04/05/20 the patient sleeps well, no apparent am drowsiness, will continue Clonazepam earlier in pm. Observe.

## 2021-02-02 NOTE — Assessment & Plan Note (Signed)
Hgb a1c 6.0 08/16/20 

## 2021-02-02 NOTE — Assessment & Plan Note (Signed)
takes Reclast, t-score -3.6 09/13/19 

## 2021-02-02 NOTE — Assessment & Plan Note (Signed)
takes Atorvastatin 5mg qd, LDL 79 08/15/20 

## 2021-02-02 NOTE — Assessment & Plan Note (Signed)
stable, on Golytely prn, Gavilyte prn.  

## 2021-02-19 ENCOUNTER — Other Ambulatory Visit: Payer: Self-pay | Admitting: Nurse Practitioner

## 2021-02-19 ENCOUNTER — Telehealth: Payer: Self-pay

## 2021-02-19 DIAGNOSIS — G4700 Insomnia, unspecified: Secondary | ICD-10-CM

## 2021-02-19 MED ORDER — CLONAZEPAM 1 MG PO TABS: 1.0000 mg | ORAL_TABLET | Freq: Every day | ORAL | 1 refills | Status: DC

## 2021-02-19 NOTE — Telephone Encounter (Signed)
Message left on clinical intake voicemail:   Kim with [facility] called on behalf of [facility]- [facility] patient stating patient was changed to clonazepam 1 mg daily and is out of medication and needs a new rx on file.  Side note, RX was approved recently for [facility] on 01/21/21 with 1 additional refill (Approved by Hazle Nordmann, NP)

## 2021-02-19 NOTE — Telephone Encounter (Signed)
Mast, Man X, NP  You 13 minutes ago (11:34 AM)   Ordered. Thanks.    Above is Manxie's response. RX was sent via another encounter. I removed the rx I had pended

## 2021-02-19 NOTE — Telephone Encounter (Signed)
Request is for a controlled substance (pending) and has to be approved by a provider

## 2021-02-20 ENCOUNTER — Ambulatory Visit: Payer: Medicare Other | Admitting: Neurology

## 2021-02-28 ENCOUNTER — Other Ambulatory Visit: Payer: Self-pay | Admitting: Nurse Practitioner

## 2021-02-28 ENCOUNTER — Encounter: Payer: Medicare Other | Admitting: Nurse Practitioner

## 2021-02-28 NOTE — Telephone Encounter (Signed)
Patient has request refill on medication "Golytely". Patient doesn't have this drink on medication list. Medication pend and sent to Mast Man, NP for approval. Please Advise.

## 2021-03-05 ENCOUNTER — Other Ambulatory Visit: Payer: Self-pay | Admitting: Nurse Practitioner

## 2021-03-05 DIAGNOSIS — G40909 Epilepsy, unspecified, not intractable, without status epilepticus: Secondary | ICD-10-CM

## 2021-03-07 ENCOUNTER — Encounter: Payer: Self-pay | Admitting: Nurse Practitioner

## 2021-03-07 ENCOUNTER — Other Ambulatory Visit: Payer: Self-pay

## 2021-03-07 ENCOUNTER — Non-Acute Institutional Stay: Payer: Medicare Other | Admitting: Nurse Practitioner

## 2021-03-07 DIAGNOSIS — M810 Age-related osteoporosis without current pathological fracture: Secondary | ICD-10-CM | POA: Diagnosis not present

## 2021-03-07 DIAGNOSIS — K3189 Other diseases of stomach and duodenum: Secondary | ICD-10-CM

## 2021-03-07 DIAGNOSIS — K5904 Chronic idiopathic constipation: Secondary | ICD-10-CM

## 2021-03-07 DIAGNOSIS — R7303 Prediabetes: Secondary | ICD-10-CM

## 2021-03-07 DIAGNOSIS — E785 Hyperlipidemia, unspecified: Secondary | ICD-10-CM

## 2021-03-07 DIAGNOSIS — S01119A Laceration without foreign body of unspecified eyelid and periocular area, initial encounter: Secondary | ICD-10-CM | POA: Diagnosis not present

## 2021-03-07 DIAGNOSIS — F5101 Primary insomnia: Secondary | ICD-10-CM

## 2021-03-07 DIAGNOSIS — G40822 Epileptic spasms, not intractable, without status epilepticus: Secondary | ICD-10-CM | POA: Diagnosis not present

## 2021-03-07 NOTE — Assessment & Plan Note (Signed)
not taking PPI, Hgb 12.9 12/21/20,  Diet controls. 

## 2021-03-07 NOTE — Assessment & Plan Note (Signed)
takes Reclast, t-score -3.6 09/13/19 

## 2021-03-07 NOTE — Assessment & Plan Note (Signed)
takes Atorvastatin 5mg qd, LDL 79 08/15/20 

## 2021-03-07 NOTE — Assessment & Plan Note (Signed)
stable, on Golytely prn, Gavilyte prn.  

## 2021-03-07 NOTE — Assessment & Plan Note (Signed)
takes Clonazepam, failed GDR,  failed Ambien. TSH 3.2 12/21/20 

## 2021-03-07 NOTE — Assessment & Plan Note (Signed)
Lateral right eyebrow, 1cm, depth noted, closed with steri strips, cold compress, f/u clinical nurse. Safety precaution emphasized to the patient.

## 2021-03-07 NOTE — Assessment & Plan Note (Signed)
Hgb a1c 6.0 08/16/20 

## 2021-03-07 NOTE — Assessment & Plan Note (Signed)
o active seizures since last seen, on Tegretol 300mg  qd, Clonazepam hs (GDR to stop on her own didn't cause active seizures, but flare up insomnia and anxiety). S/p Neurology

## 2021-03-07 NOTE — Progress Notes (Signed)
Location:   clinic [facility]   Place of Service:  Clinic (12) Provider: Marlana Latus NP  Code Status: DNR Goals of Care: [place] 01/28/2021  Does Patient Have a Medical Advance Directive? Yes  Type of Paramedic of [place];Living will  Does patient want to make changes to medical advance directive? No - Patient declined  Copy of [place] in Chart? Yes - validated most recent copy scanned in chart (See row information)     Chief Complaint  Patient presents with   Follow-up    1 month follow-up. Foot exam today. Had a fall this morning and hit head on right side, slipped on some water.     HPI: Patient is a 81 y.o. female seen today for medical management of chronic diseases.   Fell in kitchen when she slip her foot on the wet floor, resulted a skin laceration lateral right eyebrow, cleansed with alcohol wipe, closed with steri strips, cold compress is recommended, f/u clinic nurse.    Seizures, no active seizures since last seen, on Tegretol 300mg  qd, Clonazepam hs (GDR to stop on her own didn't cause active seizures, but flare up insomnia and anxiety). S/p Neurology              Constipation, stable, on Golytely prn, Gavilyte prn.              Hyperlipidemia, takes Atorvastatin 5mg  qd, LDL 79 08/15/20             Prediabetic, Hgb a1c 6.0 08/16/20             OP takes Reclast, t-score -3.6 09/13/19             Insomnia, takes Clonazepam, failed GDR,  failed Ambien. TSH 3.2 12/21/20             Erosive gastropathy, not taking PPI, Hgb 12.9 12/21/20,  Diet controls. Past Medical History:  Diagnosis Date   Anxiety    Cataract    Chronic constipation    Depression    Osteoporosis    Seizures ([place])    epilepsy  last seizure 1977    Past Surgical History:  Procedure Laterality Date   ABDOMINAL HYSTERECTOMY  1995   Dr. Edwyna Shell   CATARACT EXTRACTION Bilateral 12/2016   COLONOSCOPY  2012   patchy increased intraepithelial  lymphocytes - draelos   ESOPHAGOGASTRODUODENOSCOPY     multiple   FISSURECTOMY     WRIST SURGERY Left    due to fracture    Allergies  Allergen Reactions   Cat Hair Extract Other (See Comments)    Patient states that it causes her eyes to swell shut   Cefaclor Other (See Comments)    unknown   Codeine Nausea Only   Iodinated Contrast Media Other (See Comments)    Shrimp caused stomach pain and nausea Per patient can eat fried oysters okay     Other Nausea Only    Unknown allergy Pain medications, Pt states all kinds of pain medications she is unable to take except tylenol   Oxycodone-Acetaminophen Nausea And Vomiting   Penicillins Itching    Allergies as of 03/07/2021       Reactions   Cat Hair Extract Other (See Comments)   Patient states that it causes her eyes to swell shut   Cefaclor Other (See Comments)   unknown   Codeine Nausea Only   Iodinated Contrast Media Other (See Comments)   Shrimp  caused stomach pain and nausea Per patient can eat fried oysters okay   Other Nausea Only   Unknown allergy Pain medications, Pt states all kinds of pain medications she is unable to take except tylenol   Oxycodone-acetaminophen Nausea And Vomiting   Penicillins Itching        Medication List        Accurate as of March 07, 2021 11:59 PM. If you have any questions, ask your nurse or doctor.          atorvastatin 10 MG tablet Commonly known as: LIPITOR TAKE (1/2) TABLET DAILY.   CALTRATE 600+D PO Take 1 tablet by mouth 2 (two) times daily. Chewable 11 am and 8:30 pm   clonazePAM 1 MG tablet Commonly known as: KLONOPIN Take 1 tablet (1 mg total) by mouth at bedtime.   polyethylene glycol 236 g solution Commonly known as: GoLYTELY Take 4,000 mLs by mouth once. In the morning prior to eating   Golytely 236 g solution Generic drug: polyethylene glycol MIX WITH WATER AND DRINK 3-3 1/2 OUNCES DAILY AS DIRECTED.   PRESERVISION AREDS PO Take 1 tablet by  mouth 2 (two) times daily.   TEGretol 200 MG tablet Generic drug: carbamazepine TAKE 1&1/2 TABLETS ONCE DAILY.   Vitamin D (Ergocalciferol) 1.25 MG (50000 UNIT) Caps capsule Commonly known as: DRISDOL TAKE 1 CAPSULE WEEKLY.        Review of Systems:  Review of Systems  Constitutional:  Negative for appetite change, fatigue and fever.  HENT:  Positive for hearing loss. Negative for congestion and voice change.   Eyes:  Negative for visual disturbance.  Respiratory:  Negative for shortness of breath.   Cardiovascular:  Negative for leg swelling.  Gastrointestinal:  Negative for abdominal pain and constipation.  Genitourinary:  Positive for frequency. Negative for dysuria and urgency.       The patient stated she pushes her suprapubic region to help emptying her bladder  Musculoskeletal:  Positive for gait problem.       Mid back pain. Left hip/leg pain  Skin:  Positive for wound.  Neurological:  Positive for numbness. Negative for seizures and weakness.       Tingling/numbness left foot sometimes. Occasionally left hip/leg pain sometimes. Lightheaded when not sleeping well in lifetime.   Psychiatric/Behavioral:  Negative for behavioral problems and sleep disturbance. The patient is not nervous/anxious.        Feels lightheaded if not sleep well at night. Feels sleepy in am since Clonazepam 1mg  qhs, but sleeps well.    Health Maintenance  Topic Date Due   FOOT EXAM  Never done   OPHTHALMOLOGY EXAM  Never done   MAMMOGRAM  08/13/2017   COVID-19 Vaccine (3 - Moderna risk series) 11/06/2020   HEMOGLOBIN A1C  02/14/2021   URINE MICROALBUMIN  04/19/2021   TETANUS/TDAP  08/21/2027   Pneumonia Vaccine 28+ Years old  Completed   INFLUENZA VACCINE  Completed   DEXA SCAN  Completed   Zoster Vaccines- Shingrix  Completed   HPV VACCINES  Aged Out    Physical Exam: Vitals:   03/07/21 1326  BP: 132/68  Pulse: 88  Temp: 97.9 F (36.6 C)  TempSrc: Temporal  SpO2: 97%  Weight:  108 lb (49 kg)  Height: 5\' 7"  (1.702 m)   Body mass index is 16.92 kg/m. Physical Exam Vitals and nursing note reviewed.  Constitutional:      Appearance: Normal appearance.  HENT:     Head: Normocephalic and atraumatic.  Mouth/Throat:     Mouth: Mucous membranes are moist.  Eyes:     Extraocular Movements: Extraocular movements intact.     Conjunctiva/sclera: Conjunctivae normal.     Pupils: Pupils are equal, round, and reactive to light.  Cardiovascular:     Rate and Rhythm: Normal rate and regular rhythm.     Heart sounds: No murmur heard. Pulmonary:     Effort: Pulmonary effort is normal.     Breath sounds: No rales.  Abdominal:     General: Bowel sounds are normal.     Palpations: Abdomen is soft.     Tenderness: There is no abdominal tenderness.  Musculoskeletal:     Cervical back: Normal range of motion and neck supple.     Right lower leg: No edema.     Left lower leg: No edema.  Skin:    General: Skin is warm and dry.     Findings: Bruising present.     Comments: 1.5cm skin laceration lateral right eyebrow with depth, closed with steri strips.   Neurological:     General: No focal deficit present.     Mental Status: She is alert and oriented to person, place, and time. Mental status is at baseline.     Gait: Gait normal.  Psychiatric:     Comments: Appears anxious, repetitive, obsessed with her sleep    Labs reviewed: Basic Metabolic Panel: Recent Labs    08/15/20 0813 12/21/20 0000  NA 138 140  K 4.1 4.0  CL 100 105  CO2 30 30*  GLUCOSE 100*  --   BUN 16 21  CREATININE 0.61 0.7  CALCIUM 9.2 9.4  TSH  --  3.20   Liver Function Tests: Recent Labs    08/15/20 0813 12/21/20 0000  AST 18 17  ALT 16 16  ALKPHOS  --  38  BILITOT 0.5  --   PROT 6.2  --   ALBUMIN  --  3.8   No results for input(s): LIPASE, AMYLASE in the last 8760 hours. No results for input(s): AMMONIA in the last 8760 hours. CBC: Recent Labs    08/15/20 0813  12/21/20 0000  WBC 5.4 5.8  NEUTROABS 3,186  --   HGB 14.0 12.9  HCT 42.2 38  MCV 95.5  --   PLT 211 175   Lipid Panel: Recent Labs    04/19/20 0735 08/15/20 0813  CHOL 161 153  HDL 64 48*  LDLCALC 78 79  TRIG 104 159*  CHOLHDL 2.5 3.2   Lab Results  Component Value Date   HGBA1C 6.0 (H) 08/15/2020    Procedures since last visit: No results found.  Assessment/Plan  Epilepsy without status epilepticus, not intractable ([name]) o active seizures since last seen, on Tegretol 300mg  qd, Clonazepam hs (GDR to stop on her own didn't cause active seizures, but flare up insomnia and anxiety). S/p Neurology   Constipation  stable, on Golytely prn, Gavilyte prn.   Hyperlipidemia takes Atorvastatin 5mg  qd, LDL 79 08/15/20  Pre-diabetes Hgb a1c 6.0 08/16/20  Osteoporosis  takes Reclast, t-score -3.6 09/13/19  Insomnia takes Clonazepam, failed GDR,  failed Ambien. TSH 3.2 12/21/20  Erosive gastropathy  not taking PPI, Hgb 12.9 12/21/20,  Diet controls.  Laceration of skin of eyebrow, initial encounter Lateral right eyebrow, 1cm, depth noted, closed with steri strips, cold compress, f/u clinical nurse. Safety precaution emphasized to the patient.    Labs/tests ordered:  none  Next appt: 4 week.

## 2021-04-11 ENCOUNTER — Encounter: Payer: Self-pay | Admitting: Nurse Practitioner

## 2021-04-11 ENCOUNTER — Non-Acute Institutional Stay: Payer: Medicare Other | Admitting: Nurse Practitioner

## 2021-04-11 ENCOUNTER — Other Ambulatory Visit: Payer: Self-pay

## 2021-04-11 DIAGNOSIS — R7303 Prediabetes: Secondary | ICD-10-CM | POA: Diagnosis not present

## 2021-04-11 DIAGNOSIS — F5101 Primary insomnia: Secondary | ICD-10-CM

## 2021-04-11 DIAGNOSIS — S01119A Laceration without foreign body of unspecified eyelid and periocular area, initial encounter: Secondary | ICD-10-CM

## 2021-04-11 DIAGNOSIS — G40822 Epileptic spasms, not intractable, without status epilepticus: Secondary | ICD-10-CM | POA: Diagnosis not present

## 2021-04-11 DIAGNOSIS — H353130 Nonexudative age-related macular degeneration, bilateral, stage unspecified: Secondary | ICD-10-CM | POA: Diagnosis not present

## 2021-04-11 DIAGNOSIS — K5904 Chronic idiopathic constipation: Secondary | ICD-10-CM | POA: Diagnosis not present

## 2021-04-11 DIAGNOSIS — M810 Age-related osteoporosis without current pathological fracture: Secondary | ICD-10-CM

## 2021-04-11 DIAGNOSIS — H3530 Unspecified macular degeneration: Secondary | ICD-10-CM | POA: Insufficient documentation

## 2021-04-11 DIAGNOSIS — K3189 Other diseases of stomach and duodenum: Secondary | ICD-10-CM | POA: Diagnosis not present

## 2021-04-11 DIAGNOSIS — E785 Hyperlipidemia, unspecified: Secondary | ICD-10-CM

## 2021-04-11 NOTE — Assessment & Plan Note (Signed)
takes Reclast, t-score -3.6 09/13/19 

## 2021-04-11 NOTE — Assessment & Plan Note (Addendum)
Hgb a1c 6.0 08/16/20, update Hgb a1c, urine micro albumin

## 2021-04-11 NOTE — Assessment & Plan Note (Signed)
takes Atorvastatin 5mg qd, LDL 79 08/15/20 

## 2021-04-11 NOTE — Assessment & Plan Note (Signed)
takes Clonazepam, failed GDR,  failed Ambien. TSH 3.2 12/21/20 

## 2021-04-11 NOTE — Patient Instructions (Signed)
Due for Mammogram

## 2021-04-11 NOTE — Assessment & Plan Note (Signed)
not taking PPI, Hgb 12.9 12/21/20,  Diet controls. 

## 2021-04-11 NOTE — Assessment & Plan Note (Signed)
Upcoming Ophthalmology 05/09/21

## 2021-04-11 NOTE — Assessment & Plan Note (Addendum)
no active seizures since last seen, failed generic Tegretol in the past, on Tegretol 300mg qd, Clonazepam hs (GDR to stop on her own didn't cause active seizures, but flare up insomnia and anxiety). S/p Neurology  ?

## 2021-04-11 NOTE — Progress Notes (Signed)
Location:   clinic [facility]   Place of Service:   clinic [facility] Provider: Chipper Oman NP  Code Status: DNR Goals of Care[place] 01/28/2021  Does Patient Have a Medical Advance Directive? Yes  Type of Estate agent of [place];Living will  Does patient want to make changes to medical advance directive? No - Patient declined  Copy of Healthcare Power of Attorney in Chart? Yes - validated most recent copy scanned in chart (See row information)     Chief Complaint  Patient presents with   Medical Management of Chronic Issues    Patient presents today for 1 month follow-p.   Quality Metric Gaps    Eye & foot exam, mammogram, urine microalbumin, A1C, COVID booster    HPI: Patient is a 81 y.o. female seen today for medical management of chronic diseases.    Fell in kitchen when she slip her foot on the wet floor a month ago, healed a skin laceration lateral right eyebrow              Seizures, no active seizures since last seen, failed generic Tegretol in the past, on Tegretol 300mg  qd, Clonazepam hs (GDR to stop on her own didn't cause active seizures, but flare up insomnia and anxiety). S/p Neurology              Constipation, stable, on Golytely prn, Gavilyte prn.              Hyperlipidemia, takes Atorvastatin 5mg  qd, LDL 79 08/15/20             Prediabetic, Hgb a1c 6.0 08/16/20             OP takes Reclast, t-score -3.6 09/13/19             Insomnia, takes Clonazepam, failed GDR,  failed Ambien. TSH 3.2 12/21/20             Erosive gastropathy, not taking PPI, Hgb 12.9 12/21/20,  Diet controls.   Past Medical History:  Diagnosis Date   Anxiety    Cataract    Chronic constipation    Depression    Osteoporosis    Seizures ([name])    epilepsy  last seizure 1977    Past Surgical History:  Procedure Laterality Date   ABDOMINAL HYSTERECTOMY  1995   Dr. Carlis Abbott   CATARACT EXTRACTION Bilateral 12/2016   COLONOSCOPY  2012   patchy increased  intraepithelial lymphocytes - draelos   ESOPHAGOGASTRODUODENOSCOPY     multiple   FISSURECTOMY     WRIST SURGERY Left    due to fracture    Allergies  Allergen Reactions   Cat Hair Extract Other (See Comments)    Patient states that it causes her eyes to swell shut   Cefaclor Other (See Comments)    unknown   Codeine Nausea Only   Iodinated Contrast Media Other (See Comments)    Shrimp caused stomach pain and nausea Per patient can eat fried oysters okay     Other Nausea Only    Unknown allergy Pain medications, Pt states all kinds of pain medications she is unable to take except tylenol   Oxycodone-Acetaminophen Nausea And Vomiting   Penicillins Itching    Allergies as of 04/11/2021       Reactions   Cat Hair Extract Other (See Comments)   Patient states that it causes her eyes to swell shut   Cefaclor Other (See Comments)   unknown  Codeine Nausea Only   Iodinated Contrast Media Other (See Comments)   Shrimp caused stomach pain and nausea Per patient can eat fried oysters okay   Other Nausea Only   Unknown allergy Pain medications, Pt states all kinds of pain medications she is unable to take except tylenol   Oxycodone-acetaminophen Nausea And Vomiting   Penicillins Itching        Medication List        Accurate as of April 11, 2021 11:59 PM. If you have any questions, ask your nurse or doctor.          atorvastatin 10 MG tablet Commonly known as: LIPITOR TAKE (1/2) TABLET DAILY.   CALTRATE 600+D PO Take 1 tablet by mouth 2 (two) times daily. Chewable 11 am and 8:30 pm   clonazePAM 1 MG tablet Commonly known as: KLONOPIN Take 1 tablet (1 mg total) by mouth at bedtime.   Golytely 236 g solution Generic drug: polyethylene glycol MIX WITH WATER AND DRINK 3-3 1/2 OUNCES DAILY AS DIRECTED. What changed:  See the new instructions. Another medication with the same name was removed. Continue taking this medication, and follow the directions you  see here.   PRESERVISION AREDS PO Take 1 tablet by mouth 2 (two) times daily.   TEGretol 200 MG tablet Generic drug: carbamazepine TAKE 1&1/2 TABLETS ONCE DAILY.   Vitamin D (Ergocalciferol) 1.25 MG (50000 UNIT) Caps capsule Commonly known as: DRISDOL TAKE 1 CAPSULE WEEKLY.        Review of Systems:  Review of Systems  Constitutional:  Negative for appetite change, fatigue and fever.  HENT:  Positive for hearing loss. Negative for congestion and voice change.   Eyes:  Negative for visual disturbance.  Respiratory:  Negative for shortness of breath.   Cardiovascular:  Negative for leg swelling.  Gastrointestinal:  Negative for abdominal pain and constipation.  Genitourinary:  Positive for frequency. Negative for dysuria and urgency.       The patient stated she pushes her suprapubic region to help emptying her bladder  Musculoskeletal:  Positive for gait problem.       Mid back pain. Left hip/leg pain  Skin:  Negative for color change.  Neurological:  Positive for numbness. Negative for seizures and weakness.       Tingling/numbness left foot sometimes. Occasionally left hip/leg pain sometimes. Lightheaded when not sleeping well in lifetime.   Psychiatric/Behavioral:  Negative for behavioral problems and sleep disturbance. The patient is not nervous/anxious.        Feels lightheaded if not sleep well at night.   Health Maintenance  Topic Date Due   MAMMOGRAM  08/13/2017   COVID-19 Vaccine (3 - Moderna risk series) 11/06/2020   HEMOGLOBIN A1C  02/14/2021   URINE MICROALBUMIN  04/19/2021   OPHTHALMOLOGY EXAM  05/09/2021 (Originally 07/06/1950)   FOOT EXAM  04/11/2022   TETANUS/TDAP  08/21/2027   Pneumonia Vaccine 42+ Years old  Completed   INFLUENZA VACCINE  Completed   DEXA SCAN  Completed   Zoster Vaccines- Shingrix  Completed   HPV VACCINES  Aged Out    Physical Exam: Vitals:   04/11/21 1352  BP: 128/72  Pulse: 84  Temp: 98 F (36.7 C)  SpO2: 97%  Weight: 110  lb 12.8 oz (50.3 kg)  Height: 5\' 7"  (1.702 m)   Body mass index is 17.35 kg/m. Physical Exam Vitals and nursing note reviewed.  Constitutional:      Appearance: Normal appearance.  HENT:  Head: Normocephalic and atraumatic.     Mouth/Throat:     Mouth: Mucous membranes are moist.  Eyes:     Extraocular Movements: Extraocular movements intact.     Conjunctiva/sclera: Conjunctivae normal.     Pupils: Pupils are equal, round, and reactive to light.  Cardiovascular:     Rate and Rhythm: Normal rate and regular rhythm.     Heart sounds: No murmur heard. Pulmonary:     Effort: Pulmonary effort is normal.     Breath sounds: No rales.  Abdominal:     General: Bowel sounds are normal.     Palpations: Abdomen is soft.     Tenderness: There is no abdominal tenderness.  Musculoskeletal:     Cervical back: Normal range of motion and neck supple.     Right lower leg: No edema.     Left lower leg: No edema.  Skin:    General: Skin is warm and dry.  Neurological:     General: No focal deficit present.     Mental Status: She is alert and oriented to person, place, and time. Mental status is at baseline.     Gait: Gait normal.  Psychiatric:     Comments: Appears anxious, repetitive, obsessed with her sleep    Labs reviewed: Basic Metabolic Panel: Recent Labs    08/15/20 0813 12/21/20 0000  NA 138 140  K 4.1 4.0  CL 100 105  CO2 30 30*  GLUCOSE 100*  --   BUN 16 21  CREATININE 0.61 0.7  CALCIUM 9.2 9.4  TSH  --  3.20   Liver Function Tests: Recent Labs    08/15/20 0813 12/21/20 0000  AST 18 17  ALT 16 16  ALKPHOS  --  38  BILITOT 0.5  --   PROT 6.2  --   ALBUMIN  --  3.8   No results for input(s): LIPASE, AMYLASE in the last 8760 hours. No results for input(s): AMMONIA in the last 8760 hours. CBC: Recent Labs    08/15/20 0813 12/21/20 0000  WBC 5.4 5.8  NEUTROABS 3,186  --   HGB 14.0 12.9  HCT 42.2 38  MCV 95.5  --   PLT 211 175   Lipid  Panel: Recent Labs    04/19/20 0735 08/15/20 0813  CHOL 161 153  HDL 64 48*  LDLCALC 78 79  TRIG 104 159*  CHOLHDL 2.5 3.2   Lab Results  Component Value Date   HGBA1C 6.0 (H) 08/15/2020    Procedures since last visit: No results found.  Assessment/Plan  Laceration of skin of eyebrow, initial encounter Healed.   Epilepsy without status epilepticus, not intractable ([name]) no active seizures since last seen, failed generic Tegretol in the past,  on Tegretol 300mg  qd, Clonazepam hs (GDR to stop on her own didn't cause active seizures, but flare up insomnia and anxiety). S/p Neurology   Constipation stable, on Golytely prn, Gavilyte prn.   Hyperlipidemia  takes Atorvastatin 5mg  qd, LDL 79 08/15/20  Pre-diabetes Hgb a1c 6.0 08/16/20, update Hgb a1c, urine micro albumin  Osteoporosis  takes Reclast, t-score -3.6 09/13/19  Insomnia takes Clonazepam, failed GDR,  failed Ambien. TSH 3.2 12/21/20  Erosive gastropathy  not taking PPI, Hgb 12.9 12/21/20,  Diet controls.  Macular degeneration Upcoming Ophthalmology 05/09/21   Labs/tests ordered: microalbumin urine, Hgb a1c.   Next appt:  3 months.

## 2021-04-11 NOTE — Assessment & Plan Note (Signed)
Healed

## 2021-04-11 NOTE — Assessment & Plan Note (Signed)
stable, on Golytely prn, Gavilyte prn.  

## 2021-04-12 ENCOUNTER — Encounter: Payer: Self-pay | Admitting: Nurse Practitioner

## 2021-04-15 ENCOUNTER — Other Ambulatory Visit: Payer: Self-pay | Admitting: Nurse Practitioner

## 2021-04-15 DIAGNOSIS — E785 Hyperlipidemia, unspecified: Secondary | ICD-10-CM

## 2021-04-15 DIAGNOSIS — G4700 Insomnia, unspecified: Secondary | ICD-10-CM

## 2021-04-15 DIAGNOSIS — Z20822 Contact with and (suspected) exposure to covid-19: Secondary | ICD-10-CM | POA: Diagnosis not present

## 2021-04-15 NOTE — Telephone Encounter (Signed)
Patient has request refill on medications "Clonazepam" refill date 02/19/2021, and "Atorvastatin" refill dated 08/13/2020. Patient has Non Opioid Contract signed 10/21/2019. Patient has upcoming appointment 07/11/2021. "UPDATE CONTRACT" added to patient appointment notes. Medication pend and sent to PCP Mast, Man X, NP for approval. Please Advise.

## 2021-04-23 ENCOUNTER — Telehealth: Payer: Self-pay | Admitting: *Deleted

## 2021-04-23 NOTE — Telephone Encounter (Signed)
Received Prior Authorization from pharmacy.  ?Initiated Prior Authorization through [employer] ? ?Key: [id number] ?PA Case ID: [id number] ?Case ID: [id number] Through 04/23/2022 ? ?Prior Authorization was APPROVED.  ?Patient notified.  ?

## 2021-04-25 ENCOUNTER — Other Ambulatory Visit: Payer: Self-pay

## 2021-04-25 ENCOUNTER — Other Ambulatory Visit: Payer: Medicare Other

## 2021-04-25 DIAGNOSIS — R7303 Prediabetes: Secondary | ICD-10-CM

## 2021-04-26 LAB — HEMOGLOBIN A1C
Hgb A1c MFr Bld: 5.7 % of total Hgb — ABNORMAL HIGH (ref <5.7)
Mean Plasma Glucose: 117 mg/dL
eAG (mmol/L): 6.5 mmol/L

## 2021-04-26 LAB — EXTRA SPECIMEN

## 2021-05-03 NOTE — Progress Notes (Signed)
?[facility] - Clinic Note ? ?05/06/2021 ? ?  ? ?CHIEF COMPLAINT ?Patient presents for Retina Follow Up ? ?HISTORY OF PRESENT ILLNESS: ?Erin Ochoa is a 81 y.o. female who presents to the clinic today for:  ? ?HPI   ? ? Retina Follow Up   ?Patient presents with  Dry AMD.  In both eyes.  Severity is moderate.  Duration of 4 months.  Since onset it is stable.  I, the attending physician,  performed the HPI with the patient and updated documentation appropriately. ? ?  ?  ? ? Comments   ?Patient states vision the same OU. Checks Amsler grid and no changes on grid. No distortion. On AREDS 2 twice daily.  ? ?  ?  ?Last edited by Rennis Chris, MD on 05/06/2021 10:50 PM.  ?  ? ?Pt no change in vision, she is using Soothe drops QID ? ?Referring physician: ?Mast, Man X, NP ?[address] ?[place],  [place] [address] ? ?HISTORICAL INFORMATION:  ? ?Selected notes from the MEDICAL RECORD NUMBER ?Referred by Dr. Elmer Picker for progressing ARMD  ?LEE:  ?Ocular Hx- ?PMH- ?  ? ?CURRENT MEDICATIONS: ?No current outpatient medications on file. (Ophthalmic Drugs)  ? ?No current facility-administered medications for this visit. (Ophthalmic Drugs)  ? ?Current Outpatient Medications (Other)  ?Medication Sig  ? atorvastatin (LIPITOR) 10 MG tablet TAKE (1/2) TABLET DAILY.  ? Calcium Carbonate-Vitamin D (CALTRATE 600+D PO) Take 1 tablet by mouth 2 (two) times daily. Chewable 11 am and 8:30 pm  ? clonazePAM (KLONOPIN) 1 MG tablet TAKE ONE TABLET BY MOUTH AT BEDTIME  ? GOLYTELY 236 g solution MIX WITH WATER AND DRINK 3-3 1/2 OUNCES DAILY AS DIRECTED. (Patient taking differently: Take 3 1/2 ounces daily)  ? Multiple Vitamins-Minerals (PRESERVISION AREDS PO) Take 1 tablet by mouth 2 (two) times daily.  ? TEGRETOL 200 MG tablet TAKE 1&1/2 TABLETS ONCE DAILY.  ? Vitamin D, Ergocalciferol, (DRISDOL) 1.25 MG (50000 UNIT) CAPS capsule TAKE 1 CAPSULE WEEKLY.  ? ?No current facility-administered medications for this visit. (Other)   ? ?REVIEW OF SYSTEMS: ?ROS   ?Positive for: Eyes ?Negative for: Constitutional, Gastrointestinal, Neurological, Skin, Genitourinary, Musculoskeletal, HENT, Endocrine, Cardiovascular, Respiratory, Psychiatric, Allergic/Imm, Heme/Lymph ?Last edited by Annalee Genta D, COT on 05/06/2021  1:12 PM.  ?  ? ? ?ALLERGIES ?Allergies  ?Allergen Reactions  ? Cat Hair Extract Other (See Comments)  ?  Patient states that it causes her eyes to swell shut  ? Cefaclor Other (See Comments)  ?  unknown  ? Codeine Nausea Only  ? Iodinated Contrast Media Other (See Comments)  ?  Shrimp caused stomach pain and nausea ?Per patient can eat fried oysters okay ? ?  ? Other Nausea Only  ?  Unknown allergy ?Pain medications, Pt states all kinds of pain medications she is unable to take except tylenol  ? Oxycodone-Acetaminophen Nausea And Vomiting  ? Penicillins Itching  ? ? ?PAST MEDICAL HISTORY ?Past Medical History:  ?Diagnosis Date  ? Anxiety   ? Cataract   ? Chronic constipation   ? Depression   ? Osteoporosis   ? Seizures ([name])   ? epilepsy  last seizure 1977  ? ?Past Surgical History:  ?Procedure Laterality Date  ? ABDOMINAL HYSTERECTOMY  1995  ? Dr. Carlis Abbott  ? CATARACT EXTRACTION Bilateral 12/2016  ? COLONOSCOPY  2012  ? patchy increased intraepithelial lymphocytes - draelos  ? ESOPHAGOGASTRODUODENOSCOPY    ? multiple  ? FISSURECTOMY    ?  WRIST SURGERY Left   ? due to fracture  ? ?FAMILY HISTORY ?Family History  ?Problem Relation Age of Onset  ? Alcohol abuse Father   ? Diabetes Father   ? Diabetes Maternal Grandmother   ? Diabetes Paternal Grandmother   ? Colon cancer Maternal Grandfather   ? Esophageal cancer Neg Hx   ? Rectal cancer Neg Hx   ? Stomach cancer Neg Hx   ? ?SOCIAL HISTORY ?Social History  ? ?Tobacco Use  ? Smoking status: Former  ?  Packs/day: 1.00  ?  Types: Cigarettes  ?  Start date: 02/18/1956  ?  Quit date: 02/17/1981  ?  Years since quitting: 40.2  ? Smokeless tobacco: Never  ?Vaping Use  ? Vaping Use: Never used   ?Substance Use Topics  ? Alcohol use: No  ?  Alcohol/week: 0.0 standard drinks  ? Drug use: No  ?  ? ?  ?OPHTHALMIC EXAM: ?Base Eye Exam   ? ? Visual Acuity (Snellen - Linear)   ? ?   Right Left  ? Dist cc 20/20 -1 20/25 -1  ? Dist ph cc  20/25 +2  ? ? Correction: Glasses  ? ?  ?  ? ? Tonometry (Tonopen, 1:22 PM)   ? ?   Right Left  ? Pressure 13 11  ? ?  ?  ? ? Pupils   ? ?   Dark Light Shape React APD  ? Right 3 2 Round Brisk None  ? Left 3 2 Round Brisk None  ? ?  ?  ? ? Visual Fields (Counting fingers)   ? ?   Left Right  ?  Full Full  ? ?  ?  ? ? Extraocular Movement   ? ?   Right Left  ?  Full, Ortho Full, Ortho  ? ?  ?  ? ? Neuro/Psych   ? ? Oriented x3: Yes  ? Mood/Affect: Normal  ? ?  ?  ? ? Dilation   ? ? Both eyes: 1.0% Mydriacyl, 2.5% Phenylephrine @ 1:22 PM  ? ?  ?  ? ?  ? ?Slit Lamp and Fundus Exam   ? ? Slit Lamp Exam   ? ?   Right Left  ? Lids/Lashes Dermatochalasis - upper lid, Meibomian gland dysfunction Dermatochalasis - upper lid, Meibomian gland dysfunction  ? Conjunctiva/Sclera White and quiet White and quiet  ? Cornea Trace Punctate epithelial erosions, arcus mild arcus, 1+ inferior Punctate epithelial erosions  ? Anterior Chamber Deep and quiet Deep and quiet  ? Iris Round and dilated Round and dilated  ? Lens PC IOL in good position PC IOL in good position, 1+ Posterior capsular opacification  ? Anterior Vitreous Vitreous syneresis Vitreous syneresis, Posterior vitreous detachment, vitreous condensations  ? ?  ?  ? ? Fundus Exam   ? ?   Right Left  ? Disc Pink and Sharp Pink and Sharp  ? C/D Ratio 0.6 0.6  ? Macula Flat, Blunted foveal reflex, Drusen, RPE mottling, No heme or edema, +PED Flat, Blunted foveal reflex, central PEDs, Drusen, RPE mottling, No heme or edema  ? Vessels attenuated, Tortuous attenuated, mild tortuousity, mild AV crossing changes, mild Copper wiring  ? Periphery Attached, mild reticular degeneration, No heme Attached, mild reticular degeneration, No heme  ? ?  ?  ? ?   ? ?Refraction   ? ? Wearing Rx   ? ?   Sphere Cylinder Axis Add  ? Right -1.75 +1.25 002 +2.50  ?  Left -1.00 +1.75 172 +2.50  ? ? Type: PAL  ? ?  ?  ? ?  ? ?IMAGING AND PROCEDURES  ?Imaging and Procedures for 05/06/2021 ? ?OCT, Retina - OU - Both Eyes   ? ?   ?Right Eye ?Quality was good. Central Foveal Thickness: 305. Progression has been stable. Findings include normal foveal contour, no IRF, no SRF, retinal drusen , pigment epithelial detachment.  ? ?Left Eye ?Quality was good. Central Foveal Thickness: 330. Progression has been stable. Findings include normal foveal contour, no IRF, no SRF, retinal drusen , pigment epithelial detachment (Prominent central PEDs ).  ? ?Notes ?*Images captured and stored on drive ? ?Diagnosis / Impression:  ?Non-exu ARMD OU ?- prominent PEDs OU -- stable ? ?Clinical management:  ?See below ? ?Abbreviations: NFP - Normal foveal profile. CME - cystoid macular edema. PED - pigment epithelial detachment. IRF - intraretinal fluid. SRF - subretinal fluid. EZ - ellipsoid zone. ERM - epiretinal membrane. ORA - outer retinal atrophy. ORT - outer retinal tubulation. SRHM - subretinal hyper-reflective material. IRHM - intraretinal hyper-reflective material ? ? ?  ?  ?  ? ?  ?ASSESSMENT/PLAN: ? ?  ICD-10-CM   ?1. Intermediate stage nonexudative age-related macular degeneration of both eyes  H35.3132 OCT, Retina - OU - Both Eyes  ?  ?2. Pseudophakia, both eyes  Z96.1   ?  ? ?1. Age related macular degeneration, non-exudative, both eyes ? - intermediate stage with prominent PEDs OU (OS > OD) -- stable ? - OCT without significant change from prior ? - Recommend amsler grid monitoring ? - f/u 6 months, DFE, OCT ? ?2. Pseudophakia OU ? - s/p CE/IOL (Dr. Elmer Picker) ? - IOLs in good position, doing well ? - monitor ? ?Ophthalmic Meds Ordered this visit:  ?No orders of the defined types were placed in this encounter. ?  ? ?Return in about 6 months (around 11/06/2021) for f/u non-exu ARMD OU, DFE,  OCT. ? ?There are no Patient Instructions on file for this visit. ? ? ?Explained the diagnoses, plan, and follow up with the patient and they expressed understanding.  Patient expressed understanding of the import

## 2021-05-06 ENCOUNTER — Encounter (INDEPENDENT_AMBULATORY_CARE_PROVIDER_SITE_OTHER): Payer: Self-pay | Admitting: Ophthalmology

## 2021-05-06 ENCOUNTER — Other Ambulatory Visit: Payer: Self-pay

## 2021-05-06 ENCOUNTER — Ambulatory Visit (INDEPENDENT_AMBULATORY_CARE_PROVIDER_SITE_OTHER): Payer: Medicare Other | Admitting: Ophthalmology

## 2021-05-06 DIAGNOSIS — Z961 Presence of intraocular lens: Secondary | ICD-10-CM

## 2021-05-06 DIAGNOSIS — H353132 Nonexudative age-related macular degeneration, bilateral, intermediate dry stage: Secondary | ICD-10-CM | POA: Diagnosis not present

## 2021-05-07 DIAGNOSIS — Z20822 Contact with and (suspected) exposure to covid-19: Secondary | ICD-10-CM | POA: Diagnosis not present

## 2021-05-09 ENCOUNTER — Inpatient Hospital Stay (HOSPITAL_COMMUNITY)
Admission: EM | Admit: 2021-05-09 | Discharge: 2021-05-13 | DRG: 481 | Disposition: A | Discharge status: Nursing Home (Includes ICF) | Payer: Medicare Other | Source: Skilled Nursing Facility | Attending: Internal Medicine | Admitting: Internal Medicine

## 2021-05-09 ENCOUNTER — Inpatient Hospital Stay (HOSPITAL_COMMUNITY): Payer: Medicare Other

## 2021-05-09 ENCOUNTER — Encounter (HOSPITAL_COMMUNITY)
Admission: EM | Disposition: A | Discharge status: Nursing Home (Includes ICF) | Payer: Self-pay | Source: Skilled Nursing Facility | Attending: Internal Medicine

## 2021-05-09 ENCOUNTER — Inpatient Hospital Stay (HOSPITAL_COMMUNITY): Payer: Medicare Other | Admitting: Anesthesiology

## 2021-05-09 ENCOUNTER — Other Ambulatory Visit: Payer: Self-pay

## 2021-05-09 ENCOUNTER — Encounter (HOSPITAL_COMMUNITY): Payer: Self-pay

## 2021-05-09 ENCOUNTER — Emergency Department (HOSPITAL_COMMUNITY): Payer: Medicare Other

## 2021-05-09 DIAGNOSIS — R7303 Prediabetes: Secondary | ICD-10-CM | POA: Diagnosis present

## 2021-05-09 DIAGNOSIS — E785 Hyperlipidemia, unspecified: Secondary | ICD-10-CM | POA: Diagnosis present

## 2021-05-09 DIAGNOSIS — Z681 Body mass index (BMI) 19 or less, adult: Secondary | ICD-10-CM

## 2021-05-09 DIAGNOSIS — F5101 Primary insomnia: Secondary | ICD-10-CM | POA: Diagnosis not present

## 2021-05-09 DIAGNOSIS — Z743 Need for continuous supervision: Secondary | ICD-10-CM | POA: Diagnosis not present

## 2021-05-09 DIAGNOSIS — Z800 Family history of malignant neoplasm of digestive organs: Secondary | ICD-10-CM

## 2021-05-09 DIAGNOSIS — W1839XA Other fall on same level, initial encounter: Secondary | ICD-10-CM | POA: Diagnosis present

## 2021-05-09 DIAGNOSIS — F32A Depression, unspecified: Secondary | ICD-10-CM | POA: Diagnosis present

## 2021-05-09 DIAGNOSIS — R9431 Abnormal electrocardiogram [ECG] [EKG]: Secondary | ICD-10-CM | POA: Diagnosis not present

## 2021-05-09 DIAGNOSIS — K5909 Other constipation: Secondary | ICD-10-CM | POA: Diagnosis present

## 2021-05-09 DIAGNOSIS — F419 Anxiety disorder, unspecified: Secondary | ICD-10-CM | POA: Diagnosis present

## 2021-05-09 DIAGNOSIS — J984 Other disorders of lung: Secondary | ICD-10-CM | POA: Diagnosis not present

## 2021-05-09 DIAGNOSIS — S72145D Nondisplaced intertrochanteric fracture of left femur, subsequent encounter for closed fracture with routine healing: Secondary | ICD-10-CM | POA: Diagnosis not present

## 2021-05-09 DIAGNOSIS — Z885 Allergy status to narcotic agent status: Secondary | ICD-10-CM | POA: Diagnosis not present

## 2021-05-09 DIAGNOSIS — S72142A Displaced intertrochanteric fracture of left femur, initial encounter for closed fracture: Secondary | ICD-10-CM

## 2021-05-09 DIAGNOSIS — R636 Underweight: Secondary | ICD-10-CM | POA: Diagnosis present

## 2021-05-09 DIAGNOSIS — Z91013 Allergy to seafood: Secondary | ICD-10-CM

## 2021-05-09 DIAGNOSIS — Z880 Allergy status to penicillin: Secondary | ICD-10-CM | POA: Diagnosis not present

## 2021-05-09 DIAGNOSIS — Z91041 Radiographic dye allergy status: Secondary | ICD-10-CM

## 2021-05-09 DIAGNOSIS — S72145A Nondisplaced intertrochanteric fracture of left femur, initial encounter for closed fracture: Secondary | ICD-10-CM | POA: Diagnosis not present

## 2021-05-09 DIAGNOSIS — G8918 Other acute postprocedural pain: Secondary | ICD-10-CM | POA: Diagnosis present

## 2021-05-09 DIAGNOSIS — W19XXXA Unspecified fall, initial encounter: Secondary | ICD-10-CM | POA: Diagnosis not present

## 2021-05-09 DIAGNOSIS — R739 Hyperglycemia, unspecified: Secondary | ICD-10-CM | POA: Diagnosis present

## 2021-05-09 DIAGNOSIS — M25552 Pain in left hip: Secondary | ICD-10-CM | POA: Diagnosis present

## 2021-05-09 DIAGNOSIS — Z79899 Other long term (current) drug therapy: Secondary | ICD-10-CM

## 2021-05-09 DIAGNOSIS — S72002A Fracture of unspecified part of neck of left femur, initial encounter for closed fracture: Secondary | ICD-10-CM | POA: Diagnosis present

## 2021-05-09 DIAGNOSIS — Y92512 Supermarket, store or market as the place of occurrence of the external cause: Secondary | ICD-10-CM

## 2021-05-09 DIAGNOSIS — G40909 Epilepsy, unspecified, not intractable, without status epilepticus: Secondary | ICD-10-CM

## 2021-05-09 DIAGNOSIS — M6281 Muscle weakness (generalized): Secondary | ICD-10-CM | POA: Diagnosis not present

## 2021-05-09 DIAGNOSIS — R0789 Other chest pain: Secondary | ICD-10-CM | POA: Insufficient documentation

## 2021-05-09 DIAGNOSIS — R2681 Unsteadiness on feet: Secondary | ICD-10-CM | POA: Diagnosis not present

## 2021-05-09 DIAGNOSIS — Z90710 Acquired absence of both cervix and uterus: Secondary | ICD-10-CM | POA: Diagnosis not present

## 2021-05-09 DIAGNOSIS — M810 Age-related osteoporosis without current pathological fracture: Secondary | ICD-10-CM | POA: Diagnosis not present

## 2021-05-09 DIAGNOSIS — S79912A Unspecified injury of left hip, initial encounter: Secondary | ICD-10-CM | POA: Diagnosis not present

## 2021-05-09 DIAGNOSIS — R29898 Other symptoms and signs involving the musculoskeletal system: Secondary | ICD-10-CM | POA: Diagnosis not present

## 2021-05-09 DIAGNOSIS — Z20822 Contact with and (suspected) exposure to covid-19: Secondary | ICD-10-CM | POA: Diagnosis present

## 2021-05-09 DIAGNOSIS — E041 Nontoxic single thyroid nodule: Secondary | ICD-10-CM | POA: Insufficient documentation

## 2021-05-09 DIAGNOSIS — Z87891 Personal history of nicotine dependence: Secondary | ICD-10-CM | POA: Diagnosis not present

## 2021-05-09 DIAGNOSIS — Z833 Family history of diabetes mellitus: Secondary | ICD-10-CM

## 2021-05-09 DIAGNOSIS — S72002D Fracture of unspecified part of neck of left femur, subsequent encounter for closed fracture with routine healing: Secondary | ICD-10-CM | POA: Diagnosis not present

## 2021-05-09 DIAGNOSIS — Z9181 History of falling: Secondary | ICD-10-CM | POA: Diagnosis not present

## 2021-05-09 DIAGNOSIS — I959 Hypotension, unspecified: Secondary | ICD-10-CM | POA: Diagnosis not present

## 2021-05-09 HISTORY — PX: INTRAMEDULLARY (IM) NAIL INTERTROCHANTERIC: SHX5875

## 2021-05-09 LAB — BASIC METABOLIC PANEL
Anion gap: 7 (ref 5–15)
BUN: 23 mg/dL (ref 8–23)
CO2: 25 mmol/L (ref 22–32)
Calcium: 8.9 mg/dL (ref 8.9–10.3)
Chloride: 104 mmol/L (ref 98–111)
Creatinine, Ser: 0.55 mg/dL (ref 0.44–1.00)
GFR, Estimated: >60 mL/min (ref >60)
Glucose, Bld: 105 mg/dL — ABNORMAL HIGH (ref 70–99)
Potassium: 3.5 mmol/L (ref 3.5–5.1)
Sodium: 136 mmol/L (ref 135–145)

## 2021-05-09 LAB — CBC
HCT: 37.9 % (ref 36.0–46.0)
Hemoglobin: 12.7 g/dL (ref 12.0–15.0)
MCH: 32.6 pg (ref 26.0–34.0)
MCHC: 33.5 g/dL (ref 30.0–36.0)
MCV: 97.4 fL (ref 80.0–100.0)
Platelets: 186 10*3/uL (ref 150–400)
RBC: 3.89 MIL/uL (ref 3.87–5.11)
RDW: 13 % (ref 11.5–15.5)
WBC: 7.7 10*3/uL (ref 4.0–10.5)
nRBC: 0 % (ref 0.0–0.2)

## 2021-05-09 LAB — RESP PANEL BY RT-PCR (FLU A&B, COVID) ARPGX2
Influenza A by PCR: NEGATIVE
Influenza B by PCR: NEGATIVE
SARS Coronavirus 2 by RT PCR: NEGATIVE

## 2021-05-09 LAB — CARBAMAZEPINE LEVEL, TOTAL: Carbamazepine Lvl: 4.5 ug/mL (ref 4.0–12.0)

## 2021-05-09 LAB — SURGICAL PCR SCREEN
MRSA, PCR: NEGATIVE
Staphylococcus aureus: NEGATIVE

## 2021-05-09 SURGERY — FIXATION, FRACTURE, INTERTROCHANTERIC, WITH INTRAMEDULLARY ROD
Anesthesia: General | Laterality: Left

## 2021-05-09 MED ORDER — ONDANSETRON HCL 4 MG/2ML IJ SOLN: 4.0000 mg | Freq: Four times a day (QID) | INTRAMUSCULAR | Status: DC | PRN

## 2021-05-09 MED ORDER — TRANEXAMIC ACID-NACL 1000-0.7 MG/100ML-% IV SOLN
1000.0000 mg | Freq: Once | INTRAVENOUS | Status: AC
  Administered 2021-05-10: 1000 mg via INTRAVENOUS
  Filled 2021-05-09: qty 100

## 2021-05-09 MED ORDER — CHLORHEXIDINE GLUCONATE 4 % EX LIQD: 60.0000 mL | Freq: Once | CUTANEOUS | Status: DC

## 2021-05-09 MED ORDER — PROPOFOL 10 MG/ML IV BOLUS
INTRAVENOUS | Status: AC
  Filled 2021-05-09: qty 20

## 2021-05-09 MED ORDER — ONDANSETRON HCL 4 MG/2ML IJ SOLN
INTRAMUSCULAR | Status: AC
  Filled 2021-05-09: qty 2

## 2021-05-09 MED ORDER — LACTATED RINGERS IV SOLN: INTRAVENOUS | Status: DC

## 2021-05-09 MED ORDER — CEFAZOLIN SODIUM-DEXTROSE 2-4 GM/100ML-% IV SOLN
INTRAVENOUS | Status: AC
  Filled 2021-05-09: qty 100

## 2021-05-09 MED ORDER — ACETAMINOPHEN 10 MG/ML IV SOLN
1000.0000 mg | Freq: Four times a day (QID) | INTRAVENOUS | Status: DC
  Administered 2021-05-09: 1000 mg via INTRAVENOUS
  Filled 2021-05-09 (×4): qty 100

## 2021-05-09 MED ORDER — SUGAMMADEX SODIUM 200 MG/2ML IV SOLN
INTRAVENOUS | Status: DC | PRN
  Administered 2021-05-09: 100 mg via INTRAVENOUS

## 2021-05-09 MED ORDER — LIDOCAINE 2% (20 MG/ML) 5 ML SYRINGE
INTRAMUSCULAR | Status: AC
  Filled 2021-05-09: qty 5

## 2021-05-09 MED ORDER — CHLORHEXIDINE GLUCONATE 0.12 % MT SOLN
OROMUCOSAL | Status: AC
  Administered 2021-05-09: 15 mL via OROMUCOSAL
  Filled 2021-05-09: qty 15

## 2021-05-09 MED ORDER — DEXAMETHASONE SODIUM PHOSPHATE 10 MG/ML IJ SOLN
INTRAMUSCULAR | Status: AC
  Filled 2021-05-09: qty 1

## 2021-05-09 MED ORDER — CHLORHEXIDINE GLUCONATE 0.12 % MT SOLN: 15.0000 mL | Freq: Once | OROMUCOSAL | Status: AC

## 2021-05-09 MED ORDER — FENTANYL CITRATE (PF) 250 MCG/5ML IJ SOLN
INTRAMUSCULAR | Status: AC
  Filled 2021-05-09: qty 5

## 2021-05-09 MED ORDER — FENTANYL CITRATE (PF) 100 MCG/2ML IJ SOLN
INTRAMUSCULAR | Status: AC
  Filled 2021-05-09: qty 2

## 2021-05-09 MED ORDER — FENTANYL CITRATE (PF) 250 MCG/5ML IJ SOLN
INTRAMUSCULAR | Status: DC | PRN
  Administered 2021-05-09 (×2): 50 ug via INTRAVENOUS

## 2021-05-09 MED ORDER — TRANEXAMIC ACID-NACL 1000-0.7 MG/100ML-% IV SOLN
INTRAVENOUS | Status: AC
  Filled 2021-05-09: qty 100

## 2021-05-09 MED ORDER — ACETAMINOPHEN 10 MG/ML IV SOLN
INTRAVENOUS | Status: AC
  Filled 2021-05-09: qty 100

## 2021-05-09 MED ORDER — SODIUM CHLORIDE 0.9 % IV SOLN: INTRAVENOUS | Status: DC

## 2021-05-09 MED ORDER — MORPHINE SULFATE (PF) 4 MG/ML IV SOLN
INTRAVENOUS | Status: AC
  Filled 2021-05-09: qty 2

## 2021-05-09 MED ORDER — PHENOL 1.4 % MT LIQD
1.0000 | OROMUCOSAL | Status: DC | PRN
  Administered 2021-05-11: 1 via OROMUCOSAL
  Filled 2021-05-09: qty 177

## 2021-05-09 MED ORDER — LIDOCAINE 2% (20 MG/ML) 5 ML SYRINGE
INTRAMUSCULAR | Status: DC | PRN
Start: 2021-05-09 — End: 2021-05-09
  Administered 2021-05-09: 60 mg via INTRAVENOUS

## 2021-05-09 MED ORDER — ACETAMINOPHEN 650 MG RE SUPP: 650.0000 mg | Freq: Four times a day (QID) | RECTAL | Status: DC | PRN

## 2021-05-09 MED ORDER — FENTANYL CITRATE (PF) 100 MCG/2ML IJ SOLN
INTRAMUSCULAR | Status: AC
  Administered 2021-05-09: 50 ug via INTRAVENOUS
  Filled 2021-05-09: qty 2

## 2021-05-09 MED ORDER — METOCLOPRAMIDE HCL 5 MG PO TABS
5.0000 mg | ORAL_TABLET | Freq: Three times a day (TID) | ORAL | Status: DC
  Administered 2021-05-10 – 2021-05-13 (×10): 5 mg via ORAL
  Filled 2021-05-09 (×10): qty 1

## 2021-05-09 MED ORDER — 0.9 % SODIUM CHLORIDE (POUR BTL) OPTIME
TOPICAL | Status: DC | PRN
  Administered 2021-05-09: 1000 mL

## 2021-05-09 MED ORDER — FENTANYL CITRATE (PF) 100 MCG/2ML IJ SOLN
25.0000 ug | INTRAMUSCULAR | Status: DC | PRN
  Administered 2021-05-09 (×2): 25 ug via INTRAVENOUS

## 2021-05-09 MED ORDER — CEFAZOLIN SODIUM-DEXTROSE 2-4 GM/100ML-% IV SOLN
2.0000 g | INTRAVENOUS | Status: AC
  Administered 2021-05-09: 2 g via INTRAVENOUS

## 2021-05-09 MED ORDER — ONDANSETRON HCL 4 MG/2ML IJ SOLN: 4.0000 mg | Freq: Once | INTRAMUSCULAR | Status: DC | PRN

## 2021-05-09 MED ORDER — ACETAMINOPHEN 325 MG PO TABS
325.0000 mg | ORAL_TABLET | Freq: Four times a day (QID) | ORAL | Status: DC | PRN
  Administered 2021-05-12: 650 mg via ORAL
  Filled 2021-05-09: qty 2

## 2021-05-09 MED ORDER — CEFAZOLIN SODIUM-DEXTROSE 2-4 GM/100ML-% IV SOLN
2.0000 g | Freq: Three times a day (TID) | INTRAVENOUS | Status: AC
  Administered 2021-05-10 (×2): 2 g via INTRAVENOUS
  Filled 2021-05-09 (×2): qty 100

## 2021-05-09 MED ORDER — METOCLOPRAMIDE HCL 5 MG PO TABS: 5.0000 mg | ORAL_TABLET | Freq: Three times a day (TID) | ORAL | Status: DC | PRN

## 2021-05-09 MED ORDER — POLYVINYL ALCOHOL 1.4 % OP SOLN
1.0000 [drp] | Freq: Four times a day (QID) | OPHTHALMIC | Status: DC
  Administered 2021-05-09 – 2021-05-13 (×13): 1 [drp] via OPHTHALMIC
  Filled 2021-05-09: qty 15

## 2021-05-09 MED ORDER — ORAL CARE MOUTH RINSE: 15.0000 mL | Freq: Once | OROMUCOSAL | Status: AC

## 2021-05-09 MED ORDER — HYDROCODONE-ACETAMINOPHEN 5-325 MG PO TABS
1.0000 | ORAL_TABLET | ORAL | Status: DC | PRN
  Administered 2021-05-10: 2 via ORAL
  Administered 2021-05-10 (×2): 1 via ORAL
  Administered 2021-05-12 – 2021-05-13 (×2): 2 via ORAL
  Filled 2021-05-09 (×3): qty 2
  Filled 2021-05-09: qty 1
  Filled 2021-05-09: qty 2

## 2021-05-09 MED ORDER — CARBAMAZEPINE 200 MG PO TABS
300.0000 mg | ORAL_TABLET | Freq: Every day | ORAL | Status: DC
Start: 2021-05-09 — End: 2021-05-13
  Administered 2021-05-09 – 2021-05-12 (×4): 300 mg via ORAL
  Filled 2021-05-09 (×5): qty 1.5

## 2021-05-09 MED ORDER — ASPIRIN EC 81 MG PO TBEC
81.0000 mg | DELAYED_RELEASE_TABLET | Freq: Two times a day (BID) | ORAL | Status: DC
  Administered 2021-05-10 – 2021-05-13 (×7): 81 mg via ORAL
  Filled 2021-05-09 (×7): qty 1

## 2021-05-09 MED ORDER — ATORVASTATIN CALCIUM 10 MG PO TABS
5.0000 mg | ORAL_TABLET | Freq: Every day | ORAL | Status: DC
  Administered 2021-05-10 – 2021-05-13 (×4): 5 mg via ORAL
  Filled 2021-05-09 (×4): qty 1

## 2021-05-09 MED ORDER — ENSURE ENLIVE PO LIQD
237.0000 mL | Freq: Two times a day (BID) | ORAL | Status: DC | PRN
  Filled 2021-05-09: qty 237

## 2021-05-09 MED ORDER — MENTHOL 3 MG MT LOZG
1.0000 | LOZENGE | OROMUCOSAL | Status: DC | PRN
  Filled 2021-05-09: qty 9

## 2021-05-09 MED ORDER — TRANEXAMIC ACID-NACL 1000-0.7 MG/100ML-% IV SOLN
1000.0000 mg | INTRAVENOUS | Status: AC
  Administered 2021-05-09: 1000 mg via INTRAVENOUS

## 2021-05-09 MED ORDER — ONDANSETRON HCL 4 MG/2ML IJ SOLN
INTRAMUSCULAR | Status: DC | PRN
Start: 2021-05-09 — End: 2021-05-09
  Administered 2021-05-09: 4 mg via INTRAVENOUS

## 2021-05-09 MED ORDER — SODIUM CHLORIDE 0.9 % IV SOLN: INTRAVENOUS | Status: AC

## 2021-05-09 MED ORDER — MORPHINE SULFATE (PF) 2 MG/ML IV SOLN: 0.5000 mg | INTRAVENOUS | Status: DC | PRN

## 2021-05-09 MED ORDER — SENNOSIDES-DOCUSATE SODIUM 8.6-50 MG PO TABS
1.0000 | ORAL_TABLET | Freq: Two times a day (BID) | ORAL | Status: DC
  Administered 2021-05-09 – 2021-05-12 (×5): 1 via ORAL
  Filled 2021-05-09 (×6): qty 1

## 2021-05-09 MED ORDER — METOCLOPRAMIDE HCL 5 MG/ML IJ SOLN: 5.0000 mg | Freq: Three times a day (TID) | INTRAMUSCULAR | Status: DC | PRN

## 2021-05-09 MED ORDER — STERILE WATER FOR IRRIGATION IR SOLN
Status: DC | PRN
  Administered 2021-05-09: 1000 mL

## 2021-05-09 MED ORDER — ONDANSETRON HCL 4 MG PO TABS: 4.0000 mg | ORAL_TABLET | Freq: Four times a day (QID) | ORAL | Status: DC | PRN

## 2021-05-09 MED ORDER — BUPIVACAINE HCL (PF) 0.5 % IJ SOLN
INTRAMUSCULAR | Status: AC
  Filled 2021-05-09: qty 30

## 2021-05-09 MED ORDER — ONDANSETRON HCL 4 MG PO TABS
4.0000 mg | ORAL_TABLET | Freq: Four times a day (QID) | ORAL | Status: DC | PRN
Start: 2021-05-09 — End: 2021-05-13

## 2021-05-09 MED ORDER — HYDROMORPHONE HCL 1 MG/ML IJ SOLN: 0.5000 mg | INTRAMUSCULAR | Status: DC | PRN

## 2021-05-09 MED ORDER — OXYCODONE HCL 5 MG PO TABS
5.0000 mg | ORAL_TABLET | ORAL | Status: DC | PRN
  Administered 2021-05-10 – 2021-05-11 (×3): 5 mg via ORAL
  Filled 2021-05-09 (×4): qty 1

## 2021-05-09 MED ORDER — DOCUSATE SODIUM 100 MG PO CAPS
100.0000 mg | ORAL_CAPSULE | Freq: Two times a day (BID) | ORAL | Status: DC
  Administered 2021-05-09 – 2021-05-12 (×5): 100 mg via ORAL
  Filled 2021-05-09 (×6): qty 1

## 2021-05-09 MED ORDER — PROPOFOL 10 MG/ML IV BOLUS
INTRAVENOUS | Status: DC | PRN
  Administered 2021-05-09: 80 mg via INTRAVENOUS

## 2021-05-09 MED ORDER — PHENYLEPHRINE 40 MCG/ML (10ML) SYRINGE FOR IV PUSH (FOR BLOOD PRESSURE SUPPORT)
PREFILLED_SYRINGE | INTRAVENOUS | Status: DC | PRN
  Administered 2021-05-09: 10 ug via INTRAVENOUS

## 2021-05-09 MED ORDER — ONDANSETRON HCL 4 MG/2ML IJ SOLN
4.0000 mg | Freq: Once | INTRAMUSCULAR | Status: AC
Start: 2021-05-09 — End: 2021-05-09
  Administered 2021-05-09: 4 mg via INTRAVENOUS
  Filled 2021-05-09: qty 2

## 2021-05-09 MED ORDER — METHOCARBAMOL 1000 MG/10ML IJ SOLN
500.0000 mg | Freq: Four times a day (QID) | INTRAVENOUS | Status: DC | PRN
  Filled 2021-05-09: qty 5

## 2021-05-09 MED ORDER — MORPHINE SULFATE 4 MG/ML IJ SOLN
INTRAMUSCULAR | Status: DC | PRN
  Administered 2021-05-09: 33 mL via INTRAMUSCULAR

## 2021-05-09 MED ORDER — DEXAMETHASONE SODIUM PHOSPHATE 10 MG/ML IJ SOLN
INTRAMUSCULAR | Status: DC | PRN
Start: 2021-05-09 — End: 2021-05-09
  Administered 2021-05-09: 10 mg via INTRAVENOUS

## 2021-05-09 MED ORDER — FENTANYL CITRATE (PF) 100 MCG/2ML IJ SOLN: 50.0000 ug | Freq: Once | INTRAMUSCULAR | Status: AC

## 2021-05-09 MED ORDER — EPHEDRINE SULFATE-NACL 50-0.9 MG/10ML-% IV SOSY
PREFILLED_SYRINGE | INTRAVENOUS | Status: DC | PRN
Start: 2021-05-09 — End: 2021-05-09
  Administered 2021-05-09: 5 mg via INTRAVENOUS

## 2021-05-09 MED ORDER — ROCURONIUM BROMIDE 10 MG/ML (PF) SYRINGE
PREFILLED_SYRINGE | INTRAVENOUS | Status: AC
  Filled 2021-05-09: qty 10

## 2021-05-09 MED ORDER — ACETAMINOPHEN 500 MG PO TABS
500.0000 mg | ORAL_TABLET | Freq: Four times a day (QID) | ORAL | Status: AC
  Administered 2021-05-10 – 2021-05-11 (×4): 500 mg via ORAL
  Filled 2021-05-09 (×4): qty 1

## 2021-05-09 MED ORDER — ROCURONIUM BROMIDE 10 MG/ML (PF) SYRINGE
PREFILLED_SYRINGE | INTRAVENOUS | Status: DC | PRN
  Administered 2021-05-09: 50 mg via INTRAVENOUS

## 2021-05-09 MED ORDER — METHOCARBAMOL 500 MG PO TABS
500.0000 mg | ORAL_TABLET | Freq: Four times a day (QID) | ORAL | Status: DC | PRN
  Administered 2021-05-09: 500 mg via ORAL
  Filled 2021-05-09: qty 1

## 2021-05-09 MED ORDER — PHENYLEPHRINE HCL-NACL 20-0.9 MG/250ML-% IV SOLN
INTRAVENOUS | Status: DC | PRN
  Administered 2021-05-09: 50 ug/min via INTRAVENOUS

## 2021-05-09 MED ORDER — CLONAZEPAM 0.5 MG PO TABS
1.0000 mg | ORAL_TABLET | Freq: Every evening | ORAL | Status: DC | PRN
  Administered 2021-05-09 – 2021-05-12 (×3): 1 mg via ORAL
  Filled 2021-05-09 (×3): qty 2

## 2021-05-09 MED ORDER — CLONIDINE HCL (ANALGESIA) 100 MCG/ML EP SOLN
EPIDURAL | Status: AC
  Filled 2021-05-09: qty 10

## 2021-05-09 MED ORDER — FENTANYL CITRATE PF 50 MCG/ML IJ SOSY
50.0000 ug | PREFILLED_SYRINGE | Freq: Once | INTRAMUSCULAR | Status: AC
  Administered 2021-05-09: 50 ug via INTRAVENOUS
  Filled 2021-05-09: qty 1

## 2021-05-09 MED ORDER — POVIDONE-IODINE 10 % EX SWAB
2.0000 "application " | Freq: Once | CUTANEOUS | Status: AC
  Administered 2021-05-09: 2 via TOPICAL

## 2021-05-09 MED ORDER — ACETAMINOPHEN 325 MG PO TABS: 650.0000 mg | ORAL_TABLET | Freq: Four times a day (QID) | ORAL | Status: DC | PRN

## 2021-05-09 SURGICAL SUPPLY — 52 items
APL PRP STRL LF ISPRP CHG 10.5 (MISCELLANEOUS) ×1
APPLICATOR CHLORAPREP 10.5 ORG (MISCELLANEOUS) ×1 IMPLANT
BAG COUNTER SPONGE SURGICOUNT (BAG) ×2 IMPLANT
BAG SPNG CNTER NS LX DISP (BAG) ×1
BIT DRILL INTERTAN LAG SCREW (BIT) ×1 IMPLANT
BLADE SURG 15 STRL LF DISP TIS (BLADE) ×1 IMPLANT
BLADE SURG 15 STRL SS (BLADE) ×2
COVER MAYO STAND STRL (DRAPES) ×1 IMPLANT
COVER PERINEAL POST (MISCELLANEOUS) ×2 IMPLANT
COVER SURGICAL LIGHT HANDLE (MISCELLANEOUS) ×2 IMPLANT
DRAPE ORTHO SPLIT 77X108 STRL (DRAPES)
DRAPE STERI IOBAN 125X83 (DRAPES) ×2 IMPLANT
DRAPE SURG ISO 125X83 STRL (DRAPES) ×1 IMPLANT
DRAPE SURG ORHT 6 SPLT 77X108 (DRAPES) IMPLANT
DRSG AQUACEL AG ADV 3.5X 4 (GAUZE/BANDAGES/DRESSINGS) ×2 IMPLANT
DRSG MEPILEX BORDER 4X4 (GAUZE/BANDAGES/DRESSINGS) IMPLANT
DRSG MEPILEX BORDER 4X8 (GAUZE/BANDAGES/DRESSINGS) ×2 IMPLANT
ELECT REM PT RETURN 9FT ADLT (ELECTROSURGICAL) ×2
ELECTRODE REM PT RTRN 9FT ADLT (ELECTROSURGICAL) ×1 IMPLANT
FACESHIELD WRAPAROUND (MASK) IMPLANT
FACESHIELD WRAPAROUND OR TEAM (MASK) ×1 IMPLANT
GAUZE XEROFORM 5X9 LF (GAUZE/BANDAGES/DRESSINGS) ×1 IMPLANT
GLOVE SRG 8 PF TXTR STRL LF DI (GLOVE) ×1 IMPLANT
GLOVE SURG LTX SZ8 (GLOVE) ×2 IMPLANT
GLOVE SURG UNDER POLY LF SZ8 (GLOVE) ×2
GOWN STRL REUS W/ TWL LRG LVL3 (GOWN DISPOSABLE) ×1 IMPLANT
GOWN STRL REUS W/ TWL XL LVL3 (GOWN DISPOSABLE) IMPLANT
GOWN STRL REUS W/TWL LRG LVL3 (GOWN DISPOSABLE) ×2
GOWN STRL REUS W/TWL XL LVL3 (GOWN DISPOSABLE)
GUIDE PIN 3.2X343 (PIN) ×2
GUIDE PIN 3.2X343MM (PIN) ×4
KIT BASIN OR (CUSTOM PROCEDURE TRAY) ×2 IMPLANT
KIT TURNOVER KIT B (KITS) ×2 IMPLANT
MANIFOLD NEPTUNE II (INSTRUMENTS) ×2 IMPLANT
NAIL TRIGEN LEFT 10X38-125 (Nail) ×1 IMPLANT
NS IRRIG 1000ML POUR BTL (IV SOLUTION) ×2 IMPLANT
PACK GENERAL/GYN (CUSTOM PROCEDURE TRAY) ×2 IMPLANT
PAD ARMBOARD 7.5X6 YLW CONV (MISCELLANEOUS) ×4 IMPLANT
PIN GUIDE 3.2X343MM (PIN) IMPLANT
SCREW LAG COMPR KIT 90/85 (Screw) ×1 IMPLANT
SPONGE T-LAP 4X18 ~~LOC~~+RFID (SPONGE) IMPLANT
STAPLER VISISTAT 35W (STAPLE) ×2 IMPLANT
SUT ETHILON 3 0 PS 1 (SUTURE) ×1 IMPLANT
SUT VIC AB 0 CT1 27 (SUTURE) ×4
SUT VIC AB 0 CT1 27XBRD ANBCTR (SUTURE) IMPLANT
SUT VIC AB 1 CT1 27 (SUTURE) ×4
SUT VIC AB 1 CT1 27XBRD ANTBC (SUTURE) IMPLANT
SUT VIC AB 2-0 CTB1 (SUTURE) ×2 IMPLANT
SYR 30ML SLIP (SYRINGE) ×1 IMPLANT
TOWEL GREEN STERILE (TOWEL DISPOSABLE) ×2 IMPLANT
TOWEL GREEN STERILE FF (TOWEL DISPOSABLE) ×2 IMPLANT
WATER STERILE IRR 1000ML POUR (IV SOLUTION) ×2 IMPLANT

## 2021-05-09 NOTE — ED Triage Notes (Addendum)
Pt coming from [facility] via EMS with c/o fall with left hip pain. Pt reports that she slipped out of shoe holding door open and fell on her left hip on hard flooring. Pt denies hitting head and LOC. Pt denies any blood thinners.  ? ?A&Ox4. Bilateral pedal pulses intact. ?

## 2021-05-09 NOTE — Consult Note (Signed)
Reason for Consult: Left hip pain ?Referring Physician: Dr. Jeanell Sparrow ? ?Erin Ochoa is an 81 y.o. female.  ?HPI: Erin Ochoa is an ambulatory 81 year old female who fell today and a mechanical fall.  Door hit her and she fell directly on the left hip.  No loss of consciousness.  Denies any other orthopedic complaints.  Radiographs at [facility] demonstrated intertrochanteric fracture.  No personal or family history of DVT or pulmonary embolism.  Patient has been taking Tegretol since she was age 48 indicating possible diminished bone density. ? ?Past Medical History:  ?Diagnosis Date  ? Anxiety   ? Cataract   ? Chronic constipation   ? Depression   ? Osteoporosis   ? Seizures ([place])   ? epilepsy  last seizure 1977  ? ? ?Past Surgical History:  ?Procedure Laterality Date  ? ABDOMINAL HYSTERECTOMY  1995  ? Dr. Edwyna Shell  ? CATARACT EXTRACTION Bilateral 12/2016  ? COLONOSCOPY  2012  ? patchy increased intraepithelial lymphocytes - draelos  ? ESOPHAGOGASTRODUODENOSCOPY    ? multiple  ? FISSURECTOMY    ? WRIST SURGERY Left   ? due to fracture  ? ? ?Family History  ?Problem Relation Age of Onset  ? Alcohol abuse Father   ? Diabetes Father   ? Diabetes Maternal Grandmother   ? Diabetes Paternal Grandmother   ? Colon cancer Maternal Grandfather   ? Esophageal cancer Neg Hx   ? Rectal cancer Neg Hx   ? Stomach cancer Neg Hx   ? ? ?Social History:  reports that she quit smoking about 40 years ago. Her smoking use included cigarettes. She started smoking about 65 years ago. She smoked an average of 1 pack per day. She has never used smokeless tobacco. She reports that she does not drink alcohol and does not use drugs. ? ?Allergies:  ?Allergies  ?Allergen Reactions  ? Cat Hair Extract Other (See Comments)  ?  Patient states that it causes her eyes to swell shut  ? Cefaclor Other (See Comments)  ?  unknown  ? Codeine Nausea Only  ? Fish Allergy Nausea Only  ? Iodinated Contrast Media Other (See Comments)  ?  Shrimp caused  stomach pain and nausea ?Per patient can eat fried oysters okay ? ?  ? No [place] Food Allergies   ?  Tomatoes, Red, Seeds causes Reflux ?  ? Other Nausea Only  ?  Unknown allergy ?Pain medications, Pt states she does not know exactly which ones. Pt typically takes Tylenol for  pain.  ? Oxycodone-Acetaminophen Nausea And Vomiting  ? Penicillins Itching  ? ? ?Medications: I have reviewed the patient's current medications. ? ?Results for orders placed or performed during the hospital encounter of 05/09/21 (from the past 48 hour(s))  ?CBC     Status: None  ? Collection Time: 05/09/21  2:06 PM  ?Result Value Ref Range  ? WBC 7.7 4.0 - 10.5 K/uL  ? RBC 3.89 3.87 - 5.11 MIL/uL  ? Hemoglobin 12.7 12.0 - 15.0 g/dL  ? HCT 37.9 36.0 - 46.0 %  ? MCV 97.4 80.0 - 100.0 fL  ? MCH 32.6 26.0 - 34.0 pg  ? MCHC 33.5 30.0 - 36.0 g/dL  ? RDW 13.0 11.5 - 15.5 %  ? Platelets 186 150 - 400 K/uL  ? nRBC 0.0 0.0 - 0.2 %  ?  Comment: Performed at [facility], [place] [address] [place],  [address]  ?Basic metabolic panel     Status: Abnormal  ?  Collection Time: 05/09/21  2:06 PM  ?Result Value Ref Range  ? Sodium 136 135 - 145 mmol/L  ? Potassium 3.5 3.5 - 5.1 mmol/L  ? Chloride 104 98 - 111 mmol/L  ? CO2 25 22 - 32 mmol/L  ? Glucose, Bld 105 (H) 70 - 99 mg/dL  ?  Comment: Glucose reference range applies only to samples taken after fasting for at least 8 hours.  ? BUN 23 8 - 23 mg/dL  ? Creatinine, Ser 0.55 0.44 - 1.00 mg/dL  ? Calcium 8.9 8.9 - 10.3 mg/dL  ? GFR, Estimated >60 >60 mL/min  ?  Comment: (NOTE) ?Calculated using the CKD-EPI Creatinine Equation (2021) ?  ? Anion gap 7 5 - 15  ?  Comment: Performed at [facility], [place] [address] [place], [place] [address]  ? ? ?DG Chest Port 1 View ? ?Result Date: 05/09/2021 ?CLINICAL DATA:  hip fx EXAM: PORTABLE CHEST 1 VIEW COMPARISON:  None. FINDINGS: No consolidation. No consolidation. No visible pleural effusions or pneumothorax. Biapical  pleuroparenchymal scarring. Cardiomediastinal silhouette is within normal limits. No evidence of acute osseous abnormality. IMPRESSION: No evidence of acute cardiopulmonary disease. Electronically Signed   By: Margaretha Sheffield M.D.   On: 05/09/2021 14:39  ? ?DG Hip Unilat With Pelvis 2-3 Views Left ? ?Result Date: 05/09/2021 ?CLINICAL DATA:  [address] [address].  Left hip pain. EXAM: DG HIP (WITH OR WITHOUT PELVIS) 2-3V LEFT COMPARISON:  None. FINDINGS: Acute nondisplaced intertrochanteric fracture of left femur. Femoral neck intact. No other pelvic fracture seen. IMPRESSION: Acute nondisplaced intertrochanteric fracture of the left femur. Electronically Signed   By: Nelson Chimes M.D.   On: 05/09/2021 14:39   ? ?Review of Systems  ?Musculoskeletal:  Positive for arthralgias.  ?All other systems reviewed and are negative. ?Blood pressure (!) 153/46, pulse 78, temperature 98 ?F (36.7 ?C), temperature source Oral, resp. rate 20, height 5\' 7"  (1.702 m), weight 49.9 kg, SpO2 99 %. ?Physical Exam ?Vitals reviewed.  ?HENT:  ?   Head: Normocephalic.  ?   Nose: Nose normal.  ?   Mouth/Throat:  ?   Mouth: Mucous membranes are moist.  ?Eyes:  ?   Pupils: Pupils are equal, round, and reactive to light.  ?Cardiovascular:  ?   Rate and Rhythm: Normal rate.  ?   Pulses: Normal pulses.  ?Pulmonary:  ?   Effort: Pulmonary effort is normal.  ?Abdominal:  ?   General: Abdomen is flat.  ?Musculoskeletal:  ?   Cervical back: Normal range of motion.  ?Skin: ?   General: Skin is warm.  ?   Capillary Refill: Capillary refill takes less than 2 seconds.  ?Neurological:  ?   General: No focal deficit present.  ?   Mental Status: She is alert.  ?Psychiatric:     ?   Mood and Affect: Mood normal.  ?Ortho exam demonstrates some shortening of the left leg.  No knee effusion bilaterally.  Ankle dorsiflexion intact bilaterally.  Pedal pulses palpable bilaterally.  Both upper extremities have good range of motion of shoulder elbow and wrist.  Radial pulse intact  on both sides.  Grip strength intact. ? ?Assessment/Plan: ?Impression is left hip intertrochanteric fracture.  Plan is left hip intramedullary hip screw.  Risk and benefits are discussed with the patient including not limited to infection nerve vessel damage nonunion malunion potential need for more surgery particularly if screw cut out occurs.  This is possible in this patient who likely has diminished bone  density based on her long-term use of Tegretol.  Plan weightbearing status after evaluation of fracture stability.  All questions answered [facility] and does not use any assistive devices. ? ?Anderson Malta ?05/09/2021, 4:42 PM  ? ? ? ? ?

## 2021-05-09 NOTE — ED Notes (Signed)
Spoke with Gabriel Rung at [facility], she is aware pt is coming, spoke with Gala Romney at [employer] who is arranging transport. ?

## 2021-05-09 NOTE — Transfer of Care (Signed)
Immediate Anesthesia Transfer of Care Note ? ?Patient: Erin Ochoa ? ?Procedure(s) Performed: INTRAMEDULLARY (IM) NAIL INTERTROCHANTRIC (Left) ? ?Patient Location: PACU ? ?Anesthesia Type:General ? ?Level of Consciousness: awake and alert  ? ?Airway & Oxygen Therapy: Patient Spontanous Breathing and Patient connected to face mask oxygen ? ?Post-op Assessment: Report given to RN and Post -op Vital signs reviewed and stable ? ?Post vital signs: Reviewed and stable ? ?Last Vitals:  ?Vitals Value Taken Time  ?BP    ?Temp    ?Pulse    ?Resp    ?SpO2 99%   ? ? ?Last Pain:  ?Vitals:  ? 05/09/21 1707  ?TempSrc:   ?PainSc: 9   ?   ? ?  ? ?Complications: No notable events documented. ?

## 2021-05-09 NOTE — Anesthesia Preprocedure Evaluation (Signed)
Anesthesia Evaluation  ?Patient identified by MRN, date of birth, ID band ?Patient awake ? ? ? ?Reviewed: ?Allergy & Precautions, NPO status , Patient's Chart, lab work & pertinent test results ? ?Airway ?Mallampati: II ? ?TM Distance: >3 FB ?Neck ROM: Full ? ? ? Dental ?no notable dental hx. ? ?  ?Pulmonary ?neg pulmonary ROS, former smoker,  ?  ?Pulmonary exam normal ?breath sounds clear to auscultation ? ? ? ? ? ? Cardiovascular ?negative cardio ROS ?Normal cardiovascular exam ?Rhythm:Regular Rate:Normal ? ? ?  ?Neuro/Psych ?Seizures -, Well Controlled,  Anxiety Depression   ? GI/Hepatic ?negative GI ROS, Neg liver ROS,   ?Endo/Other  ?negative endocrine ROS ? Renal/GU ?negative Renal ROS  ?negative genitourinary ?  ?Musculoskeletal ?negative musculoskeletal ROS ?(+)  ? Abdominal ?  ?Peds ?negative pediatric ROS ?(+)  Hematology ?negative hematology ROS ?(+)   ?Anesthesia Other Findings ? ? Reproductive/Obstetrics ?negative OB ROS ? ?  ? ? ? ? ? ? ? ? ? ? ? ? ? ?  ?  ? ? ? ? ? ? ? ? ?Anesthesia Physical ?Anesthesia Plan ? ?ASA: 2 ? ?Anesthesia Plan: General  ? ?Post-op Pain Management: Ofirmev IV (intra-op)*  ? ?Induction: Intravenous ? ?PONV Risk Score and Plan: 3 and Ondansetron, Dexamethasone and Treatment may vary due to age or medical condition ? ?Airway Management Planned: Oral ETT ? ?Additional Equipment:  ? ?Intra-op Plan:  ? ?Post-operative Plan: Extubation in OR ? ?Informed Consent: I have reviewed the patients History and Physical, chart, labs and discussed the procedure including the risks, benefits and alternatives for the proposed anesthesia with the patient or authorized representative who has indicated his/her understanding and acceptance.  ? ? ? ?Dental advisory given ? ?Plan Discussed with: CRNA and Surgeon ? ?Anesthesia Plan Comments:   ? ? ? ? ? ? ?Anesthesia Quick Evaluation ? ?

## 2021-05-09 NOTE — Anesthesia Procedure Notes (Signed)
Procedure Name: Intubation ?Date/Time: 05/09/2021 7:12 PM ?Performed by: Rande Brunt, CRNA ?Pre-anesthesia Checklist: Patient identified, Emergency Drugs available, Suction available and Patient being monitored ?Patient Re-evaluated:Patient Re-evaluated prior to induction ?Oxygen Delivery Method: Circle System Utilized ?Preoxygenation: Pre-oxygenation with 100% oxygen ?Induction Type: IV induction ?Ventilation: Mask ventilation without difficulty ?Laryngoscope Size: Mac and 3 ?Grade View: Grade I ?Tube type: Oral ?Tube size: 7.0 mm ?Number of attempts: 1 ?Airway Equipment and Method: Stylet ?Placement Confirmation: ETT inserted through vocal cords under direct vision, positive ETCO2 and breath sounds checked- equal and bilateral ?Secured at: 21 cm ?Tube secured with: Tape ?Dental Injury: Teeth and Oropharynx as per pre-operative assessment  ? ? ? ? ?

## 2021-05-09 NOTE — Brief Op Note (Signed)
? ?  05/09/2021 ? ?8:37 PM ? ?PATIENT:  Erin Ochoa  81 y.o. female ? ?PRE-OPERATIVE DIAGNOSIS:  Left Hip Intertroch Fracture ? ?POST-OPERATIVE DIAGNOSIS:  Left Hip Intertroch Fracture ? ?PROCEDURE:  Procedure(s): ?INTRAMEDULLARY (IM) NAIL INTERTROCHANTRIC ? ?SURGEON:  Surgeon(s): ?Cammy Copa, MD ? ?ASSISTANT: magnant pa ? ?ANESTHESIA:   general ? ?EBL: 200 ml   ? ?Total I/O ?In: 200 [IV Piggyback:200] ?Out: -  ? ?BLOOD ADMINISTERED: none ? ?DRAINS: none  ? ?LOCAL MEDICATIONS USED:  marcaine mso4 clonidine ? ?SPECIMEN:  No Specimen ? ?COUNTS:  YES ? ?TOURNIQUET:  * No tourniquets in log * ? ?DICTATION: .Other Dictation: Dictation Number [id number] ? ?PLAN OF CARE: Admit to inpatient  ? ?PATIENT DISPOSITION:  PACU - hemodynamically stable ? ? ? ? ? ? ? ? ? ? ? ? ?  ?

## 2021-05-09 NOTE — Op Note (Signed)
NAME: Erin Ochoa, Erin Ochoa ?MEDICAL RECORD NO: [id number] ?ACCOUNT NO: [id number] ?DATE OF BIRTH: 22-Apr-1940 ?FACILITY: [facility] ?LOCATION: [facility] ?PHYSICIAN: Graylin Shiver. August Saucer, MD ? ?Operative Report  ? ?DATE OF PROCEDURE: 05/09/2021 ? ?PREOPERATIVE DIAGNOSIS:  Left hip intertrochanteric fracture. ? ?POSTOPERATIVE DIAGNOSIS:  Left hip intertrochanteric fracture. ? ?PROCEDURE:  Left hip intertrochanteric fracture open reduction internal fixation with Smith and Nephew Intertan nail 10 x 38 cm with compression and lag screw with no distal interlocking screw. ? ?SURGEON ATTENDING:  Graylin Shiver. August Saucer, MD ? ?ASSISTANT:  Karenann Cai, PA ? ?INDICATIONS:  The patient is an 81 year old patient with left hip fracture, presents for operative management after explanation of risks and benefits. ? ?DESCRIPTION OF PROCEDURE:  The patient was brought to the operating room where general anesthetic was induced.  Preoperative antibiotics administered.  Timeout was called.  Left hip was prescrubbed with alcohol and then prepped with ChloraPrep solution  ?and draped in a sterile manner using a wall drape.  Timeout was called.  Under fluoroscopic localization, the tip of the trochanter was visualized. Guide pin was placed just medial to the tip and placement was confirmed in AP and lateral planes under  ?fluoroscopy.  In accordance with preoperative templating after proximal reaming was performed, a 10 x 38 cm nail was placed.  Through a separate incision, a compression and lag screw were placed in the anterior half of the femoral neck.  When the  ?compression screw was going in, we did release some of the traction.  We placed the leg under traction to get near anatomic alignment prior to beginning the procedure.  Traction was released.  Compression was achieved.  Good reduction in the AP and  ?lateral planes was noted.  At this time thorough irrigation was performed.  Jig was removed.  The incisions were anesthetized using a combination of Marcaine,  morphine, clonidine and thoroughly irrigated and closed using #1 Vicryl suture followed by 0  ?Vicryl suture, 2-0 Vicryl suture, and 3-0 Ethibond.  Aquacel dressing applied.  The patient tolerated the procedure well without immediate complications.  Transferred to the recovery room in stable condition to begin touchdown weightbearing for transfers ? tomorrow.  Luke's assistance was required for opening, closing, limb positioning, drilling, screw placement.  His assistance was a medical necessity. ? ? ?[facility] ?D: 05/09/2021 8:41:08 pm T: 05/09/2021 11:05:00 pm  ?JOB: [id number]/ [id number]  ?

## 2021-05-09 NOTE — ED Notes (Signed)
Patient's bag of belongings and pillow were sent with patient.  ?

## 2021-05-09 NOTE — H&P (Signed)
?History and Physical  ? ? ?PatientAricela Ochoa SAY:[id number] DOB: October 25, 1940 ?DOA: 05/09/2021 ?DOS: the patient was seen and examined on 05/09/2021 ?PCP: Mast, Man X, NP  ?Patient coming from: Home ? ?Chief Complaint:  ?Chief Complaint  ?Patient presents with  ? Fall  ? Hip Pain  ? ?HPI: Erin Ochoa is a 81 y.o. female with medical history significant of anxiety, depression, cataracts, chronic constipation, osteoporosis, seizure disorder with history of last seizure in 1977 who had a mechanical fall after losing her balance while she was holding the door for a neighbor.  There were no prodromal symptoms.  She denied headache, fever, chills, sore throat, chest pain, frequent productive cough, wheezing, dyspnea, palpitations, diaphoresis, PND, orthopnea or recent pitting edema of the lower extremities.  She gets frequently constipated, but no abdominal pain, nausea, emesis, diarrhea, constipation, melena or hematochezia.  No flank pain, dysuria, frequency hematuria.  No polyuria, polydipsia, polyphagia or blurred vision. ? ?ED course: Initial vital signs were temperature 97.9 ?F, pulse 86, respiration 20, BP 150/69 mmHg and O2 sat 98% on room air.  She received 1000 mg of acetaminophen IVPB. ? ?Labwork: Her CBC was normal.  BMP showed a glucose of 105 mg/dL but was otherwise unremarkable.  Carbamazepine level was therapeutic at 4.5 mcg/mL. ? ?Imaging: Portable 1 view chest radiograph with no evidence of acute cardiopulmonary disease.  Left hip x-ray with acute nondisplaced intertrochanteric fracture of the left femur. ?  ?Review of Systems: As mentioned in the history of present illness. All other systems reviewed and are negative. ?Past Medical History:  ?Diagnosis Date  ? Anxiety   ? Cataract   ? Chronic constipation   ? Depression   ? Osteoporosis   ? Seizures ([name])   ? epilepsy  last seizure 1977  ? ?Past Surgical History:  ?Procedure Laterality Date  ? ABDOMINAL HYSTERECTOMY  1995  ? Dr. Carlis Abbott  ?  CATARACT EXTRACTION Bilateral 12/2016  ? COLONOSCOPY  2012  ? patchy increased intraepithelial lymphocytes - draelos  ? ESOPHAGOGASTRODUODENOSCOPY    ? multiple  ? FISSURECTOMY    ? WRIST SURGERY Left   ? due to fracture  ? ?Social History:  reports that she quit smoking about 40 years ago. Her smoking use included cigarettes. She started smoking about 65 years ago. She smoked an average of 1 pack per day. She has never used smokeless tobacco. She reports that she does not drink alcohol and does not use drugs. ? ?Allergies  ?Allergen Reactions  ? Cat Hair Extract Other (See Comments)  ?  Patient states that it causes her eyes to swell shut  ? Cefaclor Other (See Comments)  ?  unknown  ? Codeine Nausea Only  ? Fish Allergy Nausea Only  ? Iodinated Contrast Media Other (See Comments)  ?  Shrimp caused stomach pain and nausea ?Per patient can eat fried oysters okay ? ?  ? No [employer] Food Allergies   ?  Tomatoes, Red, Seeds causes Reflux ?  ? Other Nausea Only  ?  Unknown allergy ?Pain medications, Pt states she does not know exactly which ones. Pt typically takes Tylenol for  pain.  ? Oxycodone-Acetaminophen Nausea And Vomiting  ? Penicillins Itching  ? ? ?Family History  ?Problem Relation Age of Onset  ? Alcohol abuse Father   ? Diabetes Father   ? Diabetes Maternal Grandmother   ? Diabetes Paternal Grandmother   ? Colon cancer Maternal Grandfather   ? Esophageal cancer Neg Hx   ?  Rectal cancer Neg Hx   ? Stomach cancer Neg Hx   ? ? ?Prior to Admission medications   ?Medication Sig Start Date End Date Taking? Authorizing Provider  ?acetaminophen (TYLENOL) 500 MG tablet Take 1,000 mg by mouth every 6 (six) hours as needed for mild pain.   Yes [provider]  ?Artificial Tear Solution (SOOTHE XP) SOLN Place 1 drop into both eyes in the morning, at noon, in the evening, and at bedtime.   Yes [provider]  ?atorvastatin (LIPITOR) 10 MG tablet TAKE (1/2) TABLET DAILY. ?Patient taking differently:  Take 5 mg by mouth daily. 04/15/21  Yes Mast, Man X, NP  ?Calcium Carbonate-Vitamin D (CALTRATE 600+D PO) Take 1 tablet by mouth 2 (two) times daily. Chewable 11 am and 8:30 pm   Yes [provider]  ?clonazePAM (KLONOPIN) 1 MG tablet TAKE ONE TABLET BY MOUTH AT BEDTIME ?Patient taking differently: Take 1 mg by mouth at bedtime. 04/15/21  Yes Mast, Man X, NP  ?GOLYTELY 236 g solution MIX WITH WATER AND DRINK 3-3 1/2 OUNCES DAILY AS DIRECTED. ?Patient taking differently: Take 3 1/2 ounces daily 02/28/21  Yes Mast, Man X, NP  ?Multiple Vitamins-Minerals (PRESERVISION AREDS PO) Take 1 tablet by mouth 2 (two) times daily.   Yes [provider]  ?Nutritional Supplements (ENSURE ACTIVE HIGH PROTEIN) LIQD Take 237 mLs by mouth daily as needed (supplement).   Yes [provider]  ?TEGRETOL 200 MG tablet TAKE 1&1/2 TABLETS ONCE DAILY. ?Patient taking differently: Take 300 mg by mouth at bedtime. TAKE 1&1/2 TABLETS ONCE DAILY. 03/05/21  Yes Mast, Man X, NP  ?Vitamin D, Ergocalciferol, (DRISDOL) 1.25 MG (50000 UNIT) CAPS capsule TAKE 1 CAPSULE WEEKLY. ?Patient taking differently: Take 50,000 Units by mouth every 7 (seven) days. Tuesday 09/18/20  Yes Mast, Man X, NP  ? ? ?Physical Exam: ?Vitals:  ? 05/09/21 1509 05/09/21 1638 05/09/21 1710 05/09/21 1711  ?BP: 129/66 (!) 153/46  133/66  ?Pulse: 83 78  76  ?Resp: 17 20  11   ?Temp:  98 ?F (36.7 ?C)    ?TempSrc:  Oral    ?SpO2: 97% 99% (!) 88% 95%  ?Weight:      ?Height:      ? ?Physical Exam ?Vitals and nursing note reviewed.  ?Constitutional:   ?   General: She is awake.  ?   Appearance: She is underweight.  ?HENT:  ?   Head: Normocephalic and atraumatic.  ?   Mouth/Throat:  ?   Mouth: Mucous membranes are moist.  ?Eyes:  ?   Pupils: Pupils are equal, round, and reactive to light.  ?Cardiovascular:  ?   Rate and Rhythm: Normal rate and regular rhythm.  ?   Heart sounds: S1 normal and S2 normal.  ?Pulmonary:  ?   Effort: Pulmonary effort is normal.  ?   Breath  sounds: Normal breath sounds.  ?Abdominal:  ?   General: Abdomen is flat. There is no distension.  ?   Tenderness: There is no abdominal tenderness. There is no guarding or rebound.  ?Musculoskeletal:  ?   Cervical back: Neck supple.  ?   Left hip: Tenderness present. Decreased range of motion. Decreased strength.  ?   Right lower leg: No edema.  ?   Left lower leg: No edema.  ?   Comments: Good distal pulses.  ?Skin: ?   General: Skin is warm and dry.  ?Neurological:  ?   General: No focal deficit present.  ?  Mental Status: She is alert and oriented to person, place, and time.  ?Psychiatric:     ?   Behavior: Behavior normal. Behavior is cooperative.  ? ? ?Data Reviewed: ? ?There are no new results to review at this time. ? ?Assessment and Plan: ?Principal Problem: ?  Closed left hip fracture, initial encounter ([name]) ?Admit to telemetry as/inpatient. ?Keep NPO. ?Buck's traction per protocol. ?Ice the area per protocol. ?[facility] ?Needs antiemetics with analgesics. ?Reglan 5 mg p.o. 3 times daily. ?Ondansetron as needed. ?Analgesics as needed with antiemetic. ?Constipation prophylaxis. ?Orthopedic surgery consult and intervention appreciated. ? ?Active Problems: ?  Epilepsy without status epilepticus, not intractable ([name]) ?Continue carbamazepine 300 mg p.o. at bedtime. ?Follow-up with neurology and/or PCP as an outpatient ? ?  Pre-diabetes ?Carbohydrate modified diet. ?CBG monitoring before meals and bedtime. ?Hemoglobin A1c was 5.7% earlier this month. ? ?  Hyperlipidemia ?Continue atorvastatin 10 mg p.o. daily. ? ? ? ? ? Advance Care Planning: Full code. ? ?Consults: Rise PaganiniGregory Dean, MD (orthopedic surgery). ? ?Family Communication:  ? ?Severity of Illness: ?The appropriate patient status for this patient is INPATIENT. Inpatient status is judged to be reasonable and necessary in order to provide the required intensity of service to ensure the patient's safety. The patient's presenting symptoms,  physical exam findings, and initial radiographic and laboratory data in the context of their chronic comorbidities is felt to place them at high risk for further clinical deterioration. Furthermore, it i

## 2021-05-09 NOTE — Anesthesia Postprocedure Evaluation (Signed)
Anesthesia Post Note ? ?Patient: Erin Ochoa ? ?Procedure(s) Performed: INTRAMEDULLARY (IM) NAIL INTERTROCHANTRIC (Left) ? ?  ? ?Patient location during evaluation: PACU ?Anesthesia Type: General ?Level of consciousness: awake and alert ?Pain management: pain level controlled ?Vital Signs Assessment: post-procedure vital signs reviewed and stable ?Respiratory status: spontaneous breathing, nonlabored ventilation and respiratory function stable ?Cardiovascular status: blood pressure returned to baseline and stable ?Postop Assessment: no apparent nausea or vomiting ?Anesthetic complications: no ? ? ?No notable events documented. ? ?Last Vitals:  ?Vitals:  ? 05/09/21 2115 05/09/21 2130  ?BP: (!) 140/56 (!) 133/51  ?Pulse: 88 83  ?Resp: 16 17  ?Temp:  (!) 36.4 ?C  ?SpO2: 100% 100%  ?  ?Last Pain:  ?Vitals:  ? 05/09/21 2115  ?TempSrc:   ?PainSc: 4   ? ? ?  ?  ?  ?  ?  ?  ? ?Fritzi Scripter,W. EDMOND ? ? ? ? ?

## 2021-05-09 NOTE — ED Provider Notes (Signed)
?[facility]-EMERGENCY DEPT ?Provider Note ? ? ?CSN: [id number] ?Arrival date & time: 05/09/21  1250 ? ?  ? ?History ? ?Chief Complaint  ?Patient presents with  ? Fall  ? Hip Pain  ? ? ?Erin Ochoa is a 81 y.o. female. ? ?HPI ?81 year old female presents today complaining of left hip pain after mechanical fall.  Patient states that she was at the grocery store today.  She was holding the door for another woman.  She was accidentally struck lost her balance after she slipped and landed on her left hip.  She is complaining of severe pain in her left hip.  She denies striking her head, neck pain, back pain, or other injury.  She is not any blood thinners.  She is on Tegretol for a distant seizure disorder.  She states she has not had a seizure for greater than 30 years.  She denies any neck pain, numbness, tingling, chest pain, dyspnea, nausea, vomiting or diarrhea. ?  ? ?Home Medications ?Prior to Admission medications   ?Medication Sig Start Date End Date Taking? Authorizing Provider  ?acetaminophen (TYLENOL) 500 MG tablet Take 1,000 mg by mouth every 6 (six) hours as needed for mild pain.   Yes [provider]  ?Artificial Tear Solution (SOOTHE XP) SOLN Place 1 drop into both eyes in the morning, at noon, in the evening, and at bedtime.   Yes [provider]  ?atorvastatin (LIPITOR) 10 MG tablet TAKE (1/2) TABLET DAILY. ?Patient taking differently: Take 5 mg by mouth daily. 04/15/21  Yes Mast, Man X, NP  ?Calcium Carbonate-Vitamin D (CALTRATE 600+D PO) Take 1 tablet by mouth 2 (two) times daily. Chewable 11 am and 8:30 pm   Yes [provider]  ?clonazePAM (KLONOPIN) 1 MG tablet TAKE ONE TABLET BY MOUTH AT BEDTIME ?Patient taking differently: Take 1 mg by mouth at bedtime. 04/15/21  Yes Mast, Man X, NP  ?GOLYTELY 236 g solution MIX WITH WATER AND DRINK 3-3 1/2 OUNCES DAILY AS DIRECTED. ?Patient taking differently: Take 3 1/2 ounces daily 02/28/21  Yes Mast, Man X, NP   ?Multiple Vitamins-Minerals (PRESERVISION AREDS PO) Take 1 tablet by mouth 2 (two) times daily.   Yes [provider]  ?Nutritional Supplements (ENSURE ACTIVE HIGH PROTEIN) LIQD Take 237 mLs by mouth daily as needed (supplement).   Yes [provider]  ?TEGRETOL 200 MG tablet TAKE 1&1/2 TABLETS ONCE DAILY. ?Patient taking differently: Take 300 mg by mouth at bedtime. TAKE 1&1/2 TABLETS ONCE DAILY. 03/05/21  Yes Mast, Man X, NP  ?Vitamin D, Ergocalciferol, (DRISDOL) 1.25 MG (50000 UNIT) CAPS capsule TAKE 1 CAPSULE WEEKLY. ?Patient taking differently: Take 50,000 Units by mouth every 7 (seven) days. Tuesday 09/18/20  Yes Mast, Man X, NP  ?   ? ?Allergies    ?Cat hair extract, Cefaclor, Codeine, Fish allergy, Iodinated contrast media, Other, Oxycodone-acetaminophen, and Penicillins   ? ?Review of Systems   ?Review of Systems  ?All other systems reviewed and are negative. ? ?Physical Exam ?Updated Vital Signs ?BP 129/66   Pulse 83   Temp 97.9 ?F (36.6 ?C)   Resp 17   Ht 1.702 m (5\' 7" )   Wt 49.9 kg   SpO2 97%   BMI 17.23 kg/m?  ?Physical Exam ?Vitals and nursing note reviewed.  ?Constitutional:   ?   Appearance: Normal appearance.  ?HENT:  ?   Head: Normocephalic and atraumatic.  ?   Right Ear: External ear normal.  ?   Left Ear:  External ear normal.  ?   Nose: Nose normal.  ?   Mouth/Throat:  ?   Mouth: Mucous membranes are moist.  ?   Pharynx: Oropharynx is clear.  ?Eyes:  ?   Pupils: Pupils are equal, round, and reactive to light.  ?Cardiovascular:  ?   Rate and Rhythm: Normal rate and regular rhythm.  ?   Pulses: Normal pulses.  ?Pulmonary:  ?   Effort: Pulmonary effort is normal.  ?   Breath sounds: Normal breath sounds.  ?Abdominal:  ?   General: Abdomen is flat. Bowel sounds are normal.  ?   Palpations: Abdomen is soft.  ?Musculoskeletal:  ?   Cervical back: Normal range of motion and neck supple. No tenderness.  ?   Comments: Left hip with obvious deformity ?Left lower extremity  shortened ?Pulses are intact ?There is no injury to the skin ?Sensation is intact ?Motor intact distal to injury  ?Skin: ?   General: Skin is warm and dry.  ?   Capillary Refill: Capillary refill takes less than 2 seconds.  ?Neurological:  ?   General: No focal deficit present.  ?   Mental Status: She is alert and oriented to person, place, and time.  ?   Cranial Nerves: No cranial nerve deficit.  ?   Motor: No weakness.  ?Psychiatric:     ?   Mood and Affect: Mood normal.     ?   Behavior: Behavior normal.  ? ? ?ED Results / Procedures / Treatments   ?Labs ?(all labs ordered are listed, but only abnormal results are displayed) ?Labs Reviewed  ?BASIC METABOLIC PANEL - Abnormal; Notable for the following components:  ?    Result Value  ? Glucose, Bld 105 (*)   ? All other components within normal limits  ?RESP PANEL BY RT-PCR (FLU A&B, COVID) ARPGX2  ?CBC  ?CARBAMAZEPINE LEVEL, TOTAL  ? ? ?EKG ?None ? ?Radiology ?DG Chest Port 1 View ? ?Result Date: 05/09/2021 ?CLINICAL DATA:  hip fx EXAM: PORTABLE CHEST 1 VIEW COMPARISON:  None. FINDINGS: No consolidation. No consolidation. No visible pleural effusions or pneumothorax. Biapical pleuroparenchymal scarring. Cardiomediastinal silhouette is within normal limits. No evidence of acute osseous abnormality. IMPRESSION: No evidence of acute cardiopulmonary disease. Electronically Signed   By: Feliberto Harts M.D.   On: 05/09/2021 14:39  ? ?DG Hip Unilat With Pelvis 2-3 Views Left ? ?Result Date: 05/09/2021 ?CLINICAL DATA:  Larey Seat.  Left hip pain. EXAM: DG HIP (WITH OR WITHOUT PELVIS) 2-3V LEFT COMPARISON:  None. FINDINGS: Acute nondisplaced intertrochanteric fracture of left femur. Femoral neck intact. No other pelvic fracture seen. IMPRESSION: Acute nondisplaced intertrochanteric fracture of the left femur. Electronically Signed   By: Paulina Fusi M.D.   On: 05/09/2021 14:39   ? ?Procedures ?Procedures  ? ? ?Medications Ordered in ED ?Medications  ?acetaminophen (OFIRMEV) IV  1,000 mg (1,000 mg Intravenous New Bag/Given 05/09/21 1522)  ? ? ?ED Course/ Medical Decision Making/ A&P ?Clinical Course as of 05/09/21 1526  ?Thu May 09, 2021  ?1522 CBC reviewed, interpreted, and normal ?Basic metabolic panel reviewed interpreted normal  [DR]  ?1523 Chest x-Gimena Buick reviewed and interpreted and normal and radiologist interpretation concurs [DR]  ?1523 Left hip fracture noted on my review and interpretation ?Radiologist interpretation with left intertrochanteric fracture [DR]  ?  ?Clinical Course User Index ?[DR] Margarita Grizzle, MD  ? ?                        ?  Medical Decision Making ?81 year old female history of seizure disorder, on Tegretol with mechanical fall today.  She has a left intertrochanteric hip fracture. ?Labs are obtained and are normal. ?Care was discussed with Dr. August Saucer, on-call for orthopedic surgery. ?Care was discussed with Dr. Robb Matar, on-call for hospitalist. ?Dr. August Saucer plans on surgery tonight at [facility].  I have discussed the transfer with Dr. Robb Matar and with charge nurse.  She will be transferred from here to preop. ? ?Amount and/or Complexity of Data Reviewed ?Labs: ordered. Decision-making details documented in ED Course. ?Radiology: ordered and independent interpretation performed. Decision-making details documented in ED Course. ? ?Risk ?Prescription drug management. ?Decision regarding hospitalization. ?Risk Details: Patient with left hip fracture with risk of vascular injury and permanent disability.  Patient is medically stabilized and will be transported to [facility] for further evaluation and treatment. ? ? ? ? ? ? ? ? ? ? ?Final Clinical Impression(s) / ED Diagnoses ?Final diagnoses:  ?Fall, initial encounter  ?Closed left hip fracture, initial encounter ([name])  ? ? ?Rx / DC Orders ?ED Discharge Orders   ? ? None  ? ?  ? ? ?  ?Margarita Grizzle, MD ?05/09/21 1526 ? ?

## 2021-05-10 DIAGNOSIS — S72002A Fracture of unspecified part of neck of left femur, initial encounter for closed fracture: Secondary | ICD-10-CM | POA: Diagnosis not present

## 2021-05-10 LAB — GLUCOSE, CAPILLARY
Glucose-Capillary: 129 mg/dL — ABNORMAL HIGH (ref 70–99)
Glucose-Capillary: 133 mg/dL — ABNORMAL HIGH (ref 70–99)
Glucose-Capillary: 141 mg/dL — ABNORMAL HIGH (ref 70–99)
Glucose-Capillary: 153 mg/dL — ABNORMAL HIGH (ref 70–99)
Glucose-Capillary: 252 mg/dL — ABNORMAL HIGH (ref 70–99)

## 2021-05-10 LAB — CBC
HCT: 32.3 % — ABNORMAL LOW (ref 36.0–46.0)
Hemoglobin: 11 g/dL — ABNORMAL LOW (ref 12.0–15.0)
MCH: 33 pg (ref 26.0–34.0)
MCHC: 34.1 g/dL (ref 30.0–36.0)
MCV: 97 fL (ref 80.0–100.0)
Platelets: 178 10*3/uL (ref 150–400)
RBC: 3.33 MIL/uL — ABNORMAL LOW (ref 3.87–5.11)
RDW: 12.9 % (ref 11.5–15.5)
WBC: 9.9 10*3/uL (ref 4.0–10.5)
nRBC: 0 % (ref 0.0–0.2)

## 2021-05-10 MED ORDER — ADULT MULTIVITAMIN W/MINERALS CH
1.0000 | ORAL_TABLET | Freq: Every day | ORAL | Status: DC
  Administered 2021-05-10 – 2021-05-13 (×4): 1 via ORAL
  Filled 2021-05-10 (×4): qty 1

## 2021-05-10 MED ORDER — ENSURE ENLIVE PO LIQD
237.0000 mL | Freq: Two times a day (BID) | ORAL | Status: DC
  Administered 2021-05-11 – 2021-05-12 (×2): 237 mL via ORAL

## 2021-05-10 MED ORDER — VITAMIN D (ERGOCALCIFEROL) 1.25 MG (50000 UNIT) PO CAPS
50000.0000 [IU] | ORAL_CAPSULE | ORAL | Status: DC
  Administered 2021-05-10: 50000 [IU] via ORAL
  Filled 2021-05-10: qty 1

## 2021-05-10 NOTE — Progress Notes (Signed)
?PROGRESS NOTE ? ?Erin Ochoa  ?DOB: [birth year]  ?PCP: Mast, Man X, NP ?ELT:[id number]  ?DOA: 05/09/2021 ? LOS: 1 day  ?Hospital Day: 2 ? ?Brief narrative: ?Erin Ochoa is a 81 y.o. female with PMH significant for anxiety, depression, cataracts, chronic constipation, osteoporosis, seizure disorder ?Patient was brought to the ED on 3/23 after of fall at home from losing her balance while she was holding the door for her neighbor. ?In the ED, patient was hemodynamically stable, labs unremarkable ?Left hip x-ray showed an acute nondisplaced intertrochanteric fracture of the left femur ?Admitted to hospital service orthopedic consulted ? ?Subjective: ?Patient was seen and examined this morning. ?Propped up in bed.  Not in distress.  Pain tolerable.  No family at bedside ? ?Principal Problem: ?  Closed left hip fracture, initial encounter ([name]) ?Active Problems: ?  Epilepsy without status epilepticus, not intractable ([name]) ?  Pre-diabetes ?  Hyperlipidemia ?  ? ? ?Assessment and Plan: ?Closed left hip fracture ?Osteoporosis ?-Secondary to fall from losing her balance. ?-3/23, underwent ORIF by Dr. August Saucer ?-PT eval was obtained.  SNF recommended. ?-Pain meds and DVT prophylaxis per orthopedics.  It seems orthopedics has started the patient on aspirin 81 mg twice daily for DVT prophylaxis. ?-Continue vitamin D supplement. ? ?History of epilepsy  ?-Last seizure reportedly several years ago.   ?-Continue carbamazepine 300 mg p.o. at bedtime. ?-Follow-up with neurology and/or PCP as an outpatient ?  ?Pre-diabetes ?Hyperglycemia ?-A1c 5.7 recently.   ?-Continue CBG monitoring ?Recent Labs  ?Lab 05/10/21 ?5686 05/10/21 ?1683 05/10/21 ?1153 05/10/21 ?1512  ?GLUCAP 252* 129* 133* 141*  ? ?Hyperlipidemia ?-Continue atorvastatin 10 mg p.o. daily. ? ?Anxiety/depression ?-Klonopin 1 mg at bedtime as needed ? ?Underweight ?-Body mass index is 17.23 kg/m?.  ?-Dietitian consulted ? ? ?Goals of care ?  Code Status: Full Code   ? ? ?Mobility: PT eval obtained ? ?Nutritional status:  ?Body mass index is 17.23 kg/m?.  ?  ?  ? ? ? ? ?Diet:  ?Diet Order   ? ?       ?  Diet regular Room service appropriate? Yes; Fluid consistency: Thin  Diet effective now       ?  ? ?  ?  ? ?  ? ? ?DVT prophylaxis:  ?SCDs Start: 05/09/21 2158 ?SCDs Start: 05/09/21 2158 ?SCDs Start: 05/09/21 2158 ?Place TED hose Start: 05/09/21 2158 ?  ?Antimicrobials: None ?Fluid: NS at 75 ?Consultants: Orthopedics ?Family Communication: None at bedside ? ?Status is: Inpatient ? ?Continue in-hospital care because: POD1 ?Level of care: Telemetry Medical  ? ?Dispo: [facility] ?             Anticipated d/c is to: SNF ?             Patient currently is not medically stable to d/c. ?  Difficult to place patient No ? ? ? ? ?Infusions:  ?  ceFAZolin (ANCEF) IV 2 g (05/10/21 1453)  ? methocarbamol (ROBAXIN) IV    ? ? ?Scheduled Meds: ? acetaminophen  500 mg Oral Q6H  ? aspirin EC  81 mg Oral BID  ? atorvastatin  5 mg Oral Daily  ? carbamazepine  300 mg Oral QHS  ? docusate sodium  100 mg Oral BID  ? metoCLOPramide  5 mg Oral TID AC  ? polyvinyl alcohol  1 drop Both Eyes QID  ? senna-docusate  1 tablet Oral BID  ? Vitamin D (Ergocalciferol)  50,000 Units  Oral Weekly  ? ? ?PRN meds: ?acetaminophen, clonazePAM, feeding supplement, HYDROcodone-acetaminophen, HYDROmorphone (DILAUDID) injection, menthol-cetylpyridinium **OR** phenol, methocarbamol **OR** methocarbamol (ROBAXIN) IV, metoCLOPramide **OR** metoCLOPramide (REGLAN) injection, morphine injection, ondansetron **OR** ondansetron (ZOFRAN) IV, oxyCODONE  ? ?Antimicrobials: ?Anti-infectives (From admission, onward)  ? ? Start     Dose/Rate Route Frequency Ordered Stop  ? 05/10/21 0600  ceFAZolin (ANCEF) IVPB 2g/100 mL premix       ? 2 g ?200 mL/hr over 30 Minutes Intravenous On call to O.R. 05/09/21 1648 05/09/21 1925  ? 05/10/21 0300  ceFAZolin (ANCEF) IVPB 2g/100 mL premix       ?  2 g ?200 mL/hr over 30 Minutes Intravenous Every 8 hours 05/09/21 2158 05/10/21 2159  ? ?  ? ? ?Objective: ?Vitals:  ? 05/10/21 0953 05/10/21 1509  ?BP: (!) 105/56 (!) 123/51  ?Pulse: 86 93  ?Resp: 16 15  ?Temp: 98 ?F (36.7 ?C) 98.2 ?F (36.8 ?C)  ?SpO2: 99% 97%  ? ? ?Intake/Output Summary (Last 24 hours) at 05/10/2021 1517 ?Last data filed at 05/10/2021 0736 ?Gross per 24 hour  ?Intake 1580 ml  ?Output 50 ml  ?Net 1530 ml  ? ?Filed Weights  ? 05/09/21 1314  ?Weight: 49.9 kg  ? ?Weight change:  ?Body mass index is 17.23 kg/m?.  ? ?Physical Exam: ?General exam: Pleasant, elderly Caucasian female.  Partially controlled pain ?Skin: No rashes, lesions or ulcers. ?HEENT: Atraumatic, normocephalic, no obvious bleeding ?Lungs: Clear to auscultation bilaterally ?CVS: Regular rate and rhythm, no murmur ?GI/Abd soft, nontender, nondistended, bowel sound present ?CNS: Alert, awake, oriented x3 ?Psychiatry: Mood appropriate ?Extremities: No pedal edema, no calf tenderness ? ?Data Review: I have personally reviewed the laboratory data and studies available. ? ?F/u labs ordered ?Unresulted Labs (From admission, onward)  ? ?  Start     Ordered  ? 05/11/21 0500  Basic metabolic panel  Tomorrow morning,   R       ? 05/10/21 1517  ? 05/10/21 0500  CBC  Daily,   R     ? 05/09/21 2158  ? ?  ?  ? ?  ? ? ?Signed, ?Lorin Glass, MD ?[facility] ?05/10/2021 ? ? ? ? ? ? ? ? ? ? ? ? ?

## 2021-05-10 NOTE — Progress Notes (Signed)
?  Subjective: ?Erin Ochoa is a 81 y.o. female s/p left hip IM nail.  They are POD 1.  Pt's pain is controlled but moderate.  Pain improved compared with prior to surgery.  Complains mostly of lateral hip pain.  Denies any other complaints and specifically denies any chest pain, shortness of breath, abdominal pain.  No calf pain..   ? ?Objective: ?Vital signs in last 24 hours: ?Temp:  [97.1 ?F (36.2 ?C)-98.2 ?F (36.8 ?C)] 98.2 ?F (36.8 ?C) (03/24 1509) ?Pulse Rate:  [76-93] 93 (03/24 1509) ?Resp:  [11-17] 15 (03/24 1509) ?BP: (105-140)/(45-112) 123/51 (03/24 1509) ?SpO2:  [88 %-100 %] 97 % (03/24 1509) ? ?Intake/Output from previous day: ?03/23 0701 - 03/24 0700 ?In: 1340 [P.O.:240; I.V.:900; IV Piggyback:200] ?Out: 50 [Blood:50] ?Intake/Output this shift: ?Total I/O ?In: 362.2 [P.O.:240; IV Piggyback:122.2] ?Out: 500 [Urine:500] ? ?Exam: ? ?No gross blood or drainage overlying the dressing ?Palpable pedal pulses of the left foot ?Sensation intact distally in the left foot ?Able to dorsiflex and plantarflex the left foot ?No calf tenderness bilaterally.  Negative Homans' sign bilaterally. ? ? ?Labs: ?Recent Labs  ?  05/09/21 ?1406 05/10/21 ?0329  ?HGB 12.7 11.0*  ? ?Recent Labs  ?  05/09/21 ?1406 05/10/21 ?0329  ?WBC 7.7 9.9  ?RBC 3.89 3.33*  ?HCT 37.9 32.3*  ?PLT 186 178  ? ?Recent Labs  ?  05/09/21 ?1406  ?NA 136  ?K 3.5  ?CL 104  ?CO2 25  ?BUN 23  ?CREATININE 0.55  ?GLUCOSE 105*  ?CALCIUM 8.9  ? ?No results for input(s): LABPT, INR in the last 72 hours. ? ?Assessment/Plan: ?Pt is POD 1 s/p left hip IM nail.   ? -Disposition pending medical team clearance and decision ? -Nonweightbearing to left lower extremity with patient okay for touchdown weightbearing for transfers ? -DVT Prophylaxis: Aspirin twice daily with SCDs ?  ? ?Joycie Peek Jacob Cicero ?05/10/2021, 5:02 PM  ? ? ?   ?

## 2021-05-10 NOTE — Evaluation (Signed)
Physical Therapy Evaluation ?Patient Details ?Name: Erin Ochoa ?MRN: [id number] ?DOB: [birth year] ?Today's Date: 05/10/2021 ? ?History of Present Illness ? Erin Ochoa is a 81 y.o. female with medical history significant of anxiety, depression, cataracts, chronic constipation, osteoporosis, seizure disorder with history of last seizure in 1977 who had a mechanical fall after losing her balance while she was holding the door for a neighbor. Patient is s/p ORIF L hip fracture. WBAT ?  ?Clinical Impression ? Patient received in bed. She is hesitant to move but with encouragement she is agreeable to PT assessment. Patient required max assist for supine to sit with use of bed pad. Min assist for sit to stand with bed elevated. Min assist for pivoting to recliner from bed. Able to take 1-2 actual steps, otherwise pivoting. She will continue to benefit from skilled PT while here to improve functional mobility, independence and safety.    ?   ? ?Recommendations for follow up therapy are one component of a multi-disciplinary discharge planning process, led by the attending physician.  Recommendations may be updated based on patient status, additional functional criteria and insurance authorization. ? ?Follow Up Recommendations Skilled nursing-short term rehab (<3 hours/day) ? ?  ?Assistance Recommended at Discharge Frequent or constant Supervision/Assistance  ?Patient can return home with the following ? A lot of help with walking and/or transfers;A lot of help with bathing/dressing/bathroom;Help with stairs or ramp for entrance;Assist for transportation;Assistance with cooking/housework ? ?  ?Equipment Recommendations None recommended by PT  ?Recommendations for Other Services ?    ?  ?Functional Status Assessment Patient has had a recent decline in their functional status and demonstrates the ability to make significant improvements in function in a reasonable and predictable amount of time.  ? ?  ?Precautions /  Restrictions Precautions ?Precautions: Fall ?Restrictions ?Weight Bearing Restrictions: Yes ?LLE Weight Bearing: Weight bearing as tolerated  ? ?  ? ?Mobility ? Bed Mobility ?Overal bed mobility: Needs Assistance ?Bed Mobility: Supine to Sit ?  ?  ?Supine to sit: Max assist, HOB elevated ?  ?  ?General bed mobility comments: assisted to edge of bed with bed pad. Assist/support needed for B LEs and trunk to raise up to sitting. ?  ? ?Transfers ?Overall transfer level: Needs assistance ?Equipment used: Rolling walker (2 wheels) ?Transfers: Sit to/from Stand ?Sit to Stand: From elevated surface, Min assist ?  ?  ?  ?  ?  ?  ?  ? ?Ambulation/Gait ?Ambulation/Gait assistance: Min assist ?Gait Distance (Feet): 2 Feet ?Assistive device: Rolling walker (2 wheels) ?Gait Pattern/deviations: Step-to pattern, Decreased step length - right, Decreased step length - left, Decreased weight shift to left ?Gait velocity: decr ?  ?  ?General Gait Details: pivoting steps from bed to recliner ? ?Stairs ?  ?  ?  ?  ?  ? ?Wheelchair Mobility ?  ? ?Modified Rankin (Stroke Patients Only) ?  ? ?  ? ?Balance Overall balance assessment: Needs assistance ?Sitting-balance support: Feet supported ?Sitting balance-Leahy Scale: Fair ?  ?  ?Standing balance support: Bilateral upper extremity supported, During functional activity, Reliant on assistive device for balance ?Standing balance-Leahy Scale: Poor ?Standing balance comment: reliant on AD and min assist ?  ?  ?  ?  ?  ?  ?  ?  ?  ?  ?  ?   ? ? ? ?Pertinent Vitals/Pain Pain Assessment ?Pain Score: 10-Worst pain ever ?Pain Location: L hip with movement ?Pain Descriptors / Indicators: Discomfort, Grimacing, Guarding, Moaning, Sore ?Pain  Intervention(s): Limited activity within patient's tolerance, Monitored during session, Premedicated before session, Repositioned  ? ? ?Home Living Family/patient expects to be discharged to:: Skilled nursing facility ?  ?  ?  ?  ?  ?  ?  ?  ?  ?   ?  ?Prior  Function Prior Level of Function : Independent/Modified Independent ?  ?  ?  ?  ?  ?  ?Mobility Comments: patient did not use AD prior to admission. Lives at [facility] independent living. ?ADLs Comments: independent ?  ? ? ?Hand Dominance  ?   ? ?  ?Extremity/Trunk Assessment  ? Upper Extremity Assessment ?Upper Extremity Assessment: Overall WFL for tasks assessed ?  ? ?Lower Extremity Assessment ?Lower Extremity Assessment: LLE deficits/detail ?LLE: Unable to fully assess due to pain ?LLE Coordination: decreased gross motor ?  ? ?Cervical / Trunk Assessment ?Cervical / Trunk Assessment: Normal  ?Communication  ? Communication: No difficulties  ?Cognition Arousal/Alertness: Awake/alert ?Behavior During Therapy: [facility] for tasks assessed/performed ?Overall Cognitive Status: Within Functional Limits for tasks assessed ?  ?  ?  ?  ?  ?  ?  ?  ?  ?  ?  ?  ?  ?  ?  ?  ?  ?  ?  ? ?  ?General Comments   ? ?  ?Exercises Total Joint Exercises ?Ankle Circles/Pumps: AROM, Both, 10 reps ?Heel Slides: AROM, Left, 5 reps  ? ?Assessment/Plan  ?  ?PT Assessment Patient needs continued PT services  ?PT Problem List Decreased strength;Decreased mobility;Decreased range of motion;Decreased activity tolerance;Decreased balance;Pain;Decreased knowledge of use of DME;Decreased safety awareness ? ?   ?  ?PT Treatment Interventions DME instruction;Therapeutic exercise;Gait training;Stair training;Functional mobility training;Therapeutic activities;Patient/family education;Balance training   ? ?PT Goals (Current goals can be found in the Care Plan section)  ?Acute Rehab PT Goals ?Patient Stated Goal: to return to [facility] ?PT Goal Formulation: With patient ?Time For Goal Achievement: 05/24/21 ?Potential to Achieve Goals: Good ? ?  ?Frequency Min 3X/week ?  ? ? ?Co-evaluation   ?  ?  ?  ?  ? ? ?  ?AM-PAC PT "6 Clicks" Mobility  ?Outcome Measure Help needed turning from your back to your side while in a flat bed without using bedrails?:  A Lot ?Help needed moving from lying on your back to sitting on the side of a flat bed without using bedrails?: A Lot ?Help needed moving to and from a bed to a chair (including a wheelchair)?: A Lot ?Help needed standing up from a chair using your arms (e.g., wheelchair or bedside chair)?: A Lot ?Help needed to walk in hospital room?: Total ?Help needed climbing 3-5 steps with a railing? : Total ?6 Click Score: 10 ? ?  ?End of Session Equipment Utilized During Treatment: Gait belt ?Activity Tolerance: Patient limited by pain ?Patient left: in chair;with call bell/phone within reach ?Nurse Communication: Mobility status ?PT Visit Diagnosis: Unsteadiness on feet (R26.81);Other abnormalities of gait and mobility (R26.89);Muscle weakness (generalized) (M62.81);History of falling (Z91.81);Pain;Difficulty in walking, not elsewhere classified (R26.2) ?Pain - Right/Left: Left ?Pain - part of body: Hip ?  ? ?Time: 9024-0973 ?PT Time Calculation (min) (ACUTE ONLY): 37 min ? ? ?Charges:   PT Evaluation ?$PT Eval Moderate Complexity: 1 Mod ?PT Treatments ?$Therapeutic Activity: 8-22 mins ?  ?   ? ? ?Lissa Merlin, PT, GCS ?05/10/21,12:16 PM ? ?

## 2021-05-10 NOTE — Progress Notes (Signed)
Initial Nutrition Assessment ? ?DOCUMENTATION CODES:  ? ?Underweight ? ?INTERVENTION:  ?-Ensure Enlive po BID, each supplement provides 350 kcal and 20 grams of protein. ?-MVI with minerals daily ? ?NUTRITION DIAGNOSIS:  ? ?Increased nutrient needs related to hip fracture, post-op healing as evidenced by estimated needs. ? ?GOAL:  ? ?Patient will meet greater than or equal to 90% of their needs ? ?MONITOR:  ? ?PO intake, Supplement acceptance, Labs, Weight trends, I & O's ? ?REASON FOR ASSESSMENT:  ? ?Consult ?Hip fracture protocol ? ?ASSESSMENT:  ?Pt with PMH significant for anxiety, depression, cataracts, chronic constipation, osteoporosis, seizure disorder admitted with L hip fx. ? ?Pt unavailable at time of RD visit. Pt is day 1 post-op ORIF. Pt to d/c to SNF once medically ready.  ? ?Weight history reviewed. No significant weight changes noted though pt is underweight.  ? ?PO Intake: 100% x 1 recorded meal ? ?UOP: x24 hours ?I/O: +1168ml since admit ? ?Medications: ? docusate sodium  100 mg Oral BID  ? senna-docusate  1 tablet Oral BID  ? Vitamin D (Ergocalciferol)  50,000 Units Oral Weekly  ? ?Labs: ?Recent Labs  ?Lab 05/09/21 ?1406  ?NA 136  ?K 3.5  ?CL 104  ?CO2 25  ?BUN 23  ?CREATININE 0.55  ?CALCIUM 8.9  ?GLUCOSE 105*  ?CBGs: 129-252 x24 hours ? ?Diet Order:   ?Diet Order   ? ?       ?  Diet regular Room service appropriate? Yes; Fluid consistency: Thin  Diet effective now       ?  ? ?  ?  ? ?  ? ?EDUCATION NEEDS:  ? ?No education needs have been identified at this time ? ?Skin:  Skin Assessment: Skin Integrity Issues: ?Skin Integrity Issues:: Incisions ?Incisions: L hip ? ?Last BM:  PTA ? ?Height:  ? ?Ht Readings from Last 1 Encounters:  ?05/09/21 5\' 7"  (1.702 m)  ? ? ?Weight:  ? ?Wt Readings from Last 1 Encounters:  ?05/09/21 49.9 kg  ? ? ? ?BMI:  Body mass index is 17.23 kg/m?. ? ?Estimated Nutritional Needs:  ? ?Kcal:  1300-1500 ? ?Protein:  65-75 grams ? ?Fluid:  >1.3L ? ? ? ? ?05/11/21., MS,  RD, LDN (she/her/hers) ?RD pager number and weekend/on-call pager number located in Amion. ? ?

## 2021-05-10 NOTE — TOC Initial Note (Signed)
Transition of Care (TOC) - Initial/Assessment Note  ? ? ?Patient Details  ?Name: Erin Ochoa ?MRN: [id number] ?Date of Birth: 1940/06/24 ? ?Transition of Care [facility]) CM/SW Contact:    ?Joanne Chars, LCSW ?Phone Number: ?05/10/2021, 4:18 PM ? ?Clinical Narrative:    CSW received call from [place] at [facility], she reports pt is in [place] there, would be able to go to SNF there as well.  No PT recs at this time.  ? ?1550: CSW met with pt regarding DC recommendation for SNF.  Pt confirms the above, does want to go to SNF at [facility].  Permission given to speak with friend/POA Joe.  Pt is vaccinated for covid with one booster.              ? ? ?Expected Discharge Plan: [place] ?Barriers to Discharge: Continued Medical Work up ? ? ?Patient Goals and CMS Choice ?Patient states their goals for this hospitalization and ongoing recovery are:: walk ?CMS Medicare.gov Compare Post Acute Care list provided to::  (Pt is from [facility], wants to attend SNF there) ?  ? ?Expected Discharge Plan and Services ?Expected Discharge Plan: [place] ?In-house Referral: Clinical Social Work ?  ?Post Acute Care Choice: [place] ?Living arrangements for the past 2 months: [place] ?                ?  ?  ?  ?  ?  ?  ?  ?  ?  ?  ? ?Prior Living Arrangements/Services ?Living arrangements for the past 2 months: [place] ?Lives with:: Facility Resident ?Patient language and need for interpreter reviewed:: Yes ?Do you feel safe going back to the place where you live?: Yes      ?Need for Family Participation in Patient Care: No (Comment) ?Care giver support system in place?: Yes (comment) ?Current home services: Other (comment) (none) ?Criminal Activity/Legal Involvement Pertinent to Current Situation/Hospitalization: No - Comment as needed ? ?Activities of Daily Living ?  ?  ? ?Permission Sought/Granted ?Permission sought  to share information with : Family Supports ?Permission granted to share information with : Yes, Verbal Permission Granted ? Share Information with NAME: Joe, friend/POA ? Permission granted to share info w AGENCY: [facility] ?   ?   ? ?Emotional Assessment ?Appearance:: Appears stated age ?Attitude/Demeanor/Rapport: Engaged ?Affect (typically observed): Appropriate, Pleasant ?Orientation: : Oriented to Self, Oriented to Place, Oriented to  Time, Oriented to Situation ?Alcohol / Substance Use: Not Applicable ?Psych Involvement: No (comment) ? ?Admission diagnosis:  Fall, initial encounter [W19.XXXA] ?Closed left hip fracture, initial encounter ([place]) [S72.002A] ?Patient Active Problem List  ? Diagnosis Date Noted  ? Closed left hip fracture, initial encounter ([place]) 05/09/2021  ? Thyroid nodule 05/09/2021  ? Atypical chest pain 05/09/2021  ? Macular degeneration 04/11/2021  ? Laceration of skin of eyebrow, initial encounter 03/07/2021  ? Dysuria 11/20/2020  ? Dry skin dermatitis 11/20/2020  ? Hyperlipidemia 03/10/2019  ? Weight loss 07/29/2018  ? Abnormal CT scan, pelvis 06/17/2018  ? Erosive gastropathy 03/25/2018  ? Renal cyst, right 01/13/2018  ? Liver cyst 01/13/2018  ? Abdominal pain, chronic, epigastric 10/29/2017  ? Anxiety 06/04/2017  ? High risk medications (not anticoagulants) long-term use 06/04/2017  ? Pre-diabetes 02/03/2017  ? Cataract 10/30/2016  ? H/O colonoscopy 10/30/2016  ? Osteoporosis 10/30/2016  ? Constipation 10/02/2016  ? Rosacea 01/24/2016  ? Gastroesophageal reflux disease without esophagitis 01/24/2016  ? Epilepsy  without status epilepticus, not intractable ([place]) 08/24/2014  ? Insomnia 08/24/2014  ? ?PCP:  Mast, Man X, NP ?Pharmacy:   ?[facility] ?[place] [address] ?Phone: [phone] Fax: [phone] ? ?[facility] ?[place] [address] ?Phone: [phone] Fax: [phone] ? ? ? ? ?Social Determinants of Health (SDOH) Interventions ?  ? ?Readmission Risk Interventions ?   ? View : No data to display.  ?  ?  ?  ? ? ? ?

## 2021-05-10 NOTE — NC FL2 (Signed)
?[place] MEDICAID FL2 LEVEL OF CARE SCREENING TOOL  ?  ? ?IDENTIFICATION  ?Patient Name: ?Erin Ochoa Birthdate: 1940-07-12 Sex: female Admission Date (Current Location): ?05/09/2021  ?[place] and [place] Number: ? [place] ?  Facility and Address:  ?[facility], [place] [address] [place], [place] [address] ?     Provider Number: ?[id number]  ?Attending Physician Name and Address:  ?Terrilee Croak, MD ? Relative Name and Phone Number:  ?Ileana Roup   [phone] ?   ?Current Level of Care: ?Hospital Recommended Level of Care: ?[place] Prior Approval Number: ?  ? ?Date Approved/Denied: ?  PASRR Number: ?[id number] ? ?Discharge Plan: ?SNF ?  ? ?Current Diagnoses: ?Patient Active Problem List  ? Diagnosis Date Noted  ? Closed left hip fracture, initial encounter ([place]) 05/09/2021  ? Thyroid nodule 05/09/2021  ? Atypical chest pain 05/09/2021  ? Macular degeneration 04/11/2021  ? Laceration of skin of eyebrow, initial encounter 03/07/2021  ? Dysuria 11/20/2020  ? Dry skin dermatitis 11/20/2020  ? Hyperlipidemia 03/10/2019  ? Weight loss 07/29/2018  ? Abnormal CT scan, pelvis 06/17/2018  ? Erosive gastropathy 03/25/2018  ? Renal cyst, right 01/13/2018  ? Liver cyst 01/13/2018  ? Abdominal pain, chronic, epigastric 10/29/2017  ? Anxiety 06/04/2017  ? High risk medications (not anticoagulants) long-term use 06/04/2017  ? Pre-diabetes 02/03/2017  ? Cataract 10/30/2016  ? H/O colonoscopy 10/30/2016  ? Osteoporosis 10/30/2016  ? Constipation 10/02/2016  ? Rosacea 01/24/2016  ? Gastroesophageal reflux disease without esophagitis 01/24/2016  ? Epilepsy without status epilepticus, not intractable ([place]) 08/24/2014  ? Insomnia 08/24/2014  ? ? ?Orientation RESPIRATION BLADDER Height & Weight   ?  ?Self, Time, Situation, Place ? Normal Continent, External catheter Weight: 110 lb (49.9 kg) ?Height:  5\' 7"  (170.2 cm)  ?BEHAVIORAL SYMPTOMS/MOOD NEUROLOGICAL BOWEL NUTRITION STATUS  ?   Convulsions/Seizures Continent Diet (see discharge summary)  ?AMBULATORY STATUS COMMUNICATION OF NEEDS Skin   ?Limited Assist Verbally Surgical wounds ?  ?  ?  ?    ?     ?     ? ? ?Personal Care Assistance Level of Assistance  ?Bathing, Feeding, Dressing Bathing Assistance: Maximum assistance ?Feeding assistance: Independent ?Dressing Assistance: Maximum assistance ?   ? ?Functional Limitations Info  ?Sight, Hearing, Speech Sight Info: Adequate ?Hearing Info: Adequate ?Speech Info: Adequate  ? ? ?SPECIAL CARE FACTORS FREQUENCY  ?PT (By licensed PT), OT (By licensed OT)   ?  ?PT Frequency: 5x week ?OT Frequency: 5x week ?  ?  ?  ?   ? ? ?Contractures Contractures Info: Not present  ? ? ?Additional Factors Info  ?Code Status, Allergies Code Status Info: full ?Allergies Info: Cat Hair Extract, Cefaclor, Codeine, Fish Allergy, Iodinated Contrast Media, No Healthtouch Food Allergies, Other, Oxycodone-acetaminophen, Penicillins ?  ?  ?  ?   ? ?Current Medications (05/10/2021):  This is the current hospital active medication list ?Current Facility-Administered Medications  ?Medication Dose Route Frequency Provider Last Rate Last Admin  ? acetaminophen (TYLENOL) tablet 325-650 mg  325-650 mg Oral Q6H PRN Magnant, Charles L, PA-C      ? acetaminophen (TYLENOL) tablet 500 mg  500 mg Oral Q6H Magnant, Charles L, PA-C      ? aspirin EC tablet 81 mg  81 mg Oral BID Magnant, Charles L, PA-C   81 mg at 05/10/21 0949  ? atorvastatin (LIPITOR) tablet 5 mg  5 mg Oral Daily Reubin Milan, MD   5 mg at  05/10/21 0949  ? carbamazepine (TEGRETOL) tablet 300 mg  300 mg Oral QHS Reubin Milan, MD   300 mg at 05/09/21 2139  ? clonazePAM (KLONOPIN) tablet 1 mg  1 mg Oral QHS PRN Dwyane Dee, MD   1 mg at 05/09/21 2328  ? docusate sodium (COLACE) capsule 100 mg  100 mg Oral BID Magnant, Charles L, PA-C   100 mg at 05/09/21 2246  ? feeding supplement (ENSURE ENLIVE / ENSURE PLUS) liquid 237 mL  237 mL Oral BID BM & HS PRN Reubin Milan, MD      ? HYDROcodone-acetaminophen (NORCO/VICODIN) 5-325 MG per tablet 1-2 tablet  1-2 tablet Oral Q4H PRN Magnant, Charles L, PA-C   1 tablet at 05/10/21 1009  ? HYDROmorphone (DILAUDID) injection 0.5 mg  0.5 mg Intravenous Q2H PRN Reubin Milan, MD      ? menthol-cetylpyridinium (CEPACOL) lozenge 3 mg  1 lozenge Oral PRN Magnant, Gerrianne Scale, PA-C      ? Or  ? phenol (CHLORASEPTIC) mouth spray 1 spray  1 spray Mouth/Throat PRN Magnant, Charles L, PA-C      ? methocarbamol (ROBAXIN) tablet 500 mg  500 mg Oral Q6H PRN Magnant, Charles L, PA-C   500 mg at 05/09/21 2246  ? Or  ? methocarbamol (ROBAXIN) 500 mg in dextrose 5 % 50 mL IVPB  500 mg Intravenous Q6H PRN Magnant, Charles L, PA-C      ? metoCLOPramide (REGLAN) tablet 5-10 mg  5-10 mg Oral Q8H PRN Magnant, Charles L, PA-C      ? Or  ? metoCLOPramide (REGLAN) injection 5-10 mg  5-10 mg Intravenous Q8H PRN Magnant, Charles L, PA-C      ? metoCLOPramide (REGLAN) tablet 5 mg  5 mg Oral TID AC Reubin Milan, MD   5 mg at 05/10/21 1203  ? morphine (PF) 2 MG/ML injection 0.5 mg  0.5 mg Intravenous Q2H PRN Magnant, Charles L, PA-C      ? ondansetron (ZOFRAN) tablet 4 mg  4 mg Oral Q6H PRN Magnant, Gerrianne Scale, PA-C      ? Or  ? ondansetron (ZOFRAN) injection 4 mg  4 mg Intravenous Q6H PRN Magnant, Charles L, PA-C      ? oxyCODONE (Oxy IR/ROXICODONE) immediate release tablet 5 mg  5 mg Oral Q4H PRN Reubin Milan, MD   5 mg at 05/10/21 1004  ? polyvinyl alcohol (LIQUIFILM TEARS) 1.4 % ophthalmic solution 1 drop  1 drop Both Eyes QID Reubin Milan, MD   1 drop at 05/10/21 1315  ? senna-docusate (Senokot-S) tablet 1 tablet  1 tablet Oral BID Reubin Milan, MD   1 tablet at 05/10/21 [id number]  ? Vitamin D (Ergocalciferol) (DRISDOL) capsule 50,000 Units  50,000 Units Oral Weekly Terrilee Croak, MD   50,000 Units at 05/10/21 1204  ? ? ? ?Discharge Medications: ?Please see discharge summary for a list of discharge medications. ? ?Relevant  Imaging Results: ? ?Relevant Lab Results: ? ? ?Additional Information ?SSN: [id number], pt is vaccinated for covid with one booster. ? ?Joanne Chars, LCSW ? ? ? ? ?

## 2021-05-10 NOTE — TOC CAGE-AID Note (Signed)
Transition of Care (TOC) - CAGE-AID Screening ? ? ?Patient Details  ?Name: Dlisa Barnwell ?MRN: [id number] ?Date of Birth: 1940/03/19 ? ?Transition of Care (TOC) CM/SW Contact:    ?Aalivia Mcgraw C Tarpley-Carter, LCSWA ?Phone Number: ?05/10/2021, 2:30 PM ? ? ?Clinical Narrative: ?Pt participated in Cage-Aid.  Pt stated she does not use substance or ETOH.  Pt was not offered resources, due to no usage of substance or ETOH.   ? ?Insurance underwriter, MSW, LCSW-A ?Pronouns:  She/Her/Hers ?[facility] ?Clinical Social Worker ?Direct Number:  [phone] ?[email or web address] ? ?CAGE-AID Screening: ?  ? ?Have You Ever Felt You Ought to Cut Down on Your Drinking or Drug Use?: No ?Have People Annoyed You By Critizing Your Drinking Or Drug Use?: No ?Have You Felt Bad Or Guilty About Your Drinking Or Drug Use?: No ?Have You Ever Had a Drink or Used Drugs First Thing In The Morning to Steady Your Nerves or to Get Rid of a Hangover?: No ?CAGE-AID Score: 0 ? ?Substance Abuse Education Offered: No ? ?  ? ? ? ? ? ? ?

## 2021-05-10 NOTE — Progress Notes (Signed)
This nurse let Dr. Pietro Cassis know that pt glucose was 144 @4 :51 this afternoon ? ?

## 2021-05-10 NOTE — Evaluation (Signed)
Occupational Therapy Evaluation ?Patient Details ?Name: Erin Ochoa ?MRN: [id number] ?DOB: 1940/09/13 ?Today's Date: 05/10/2021 ? ? ?History of Present Illness Erin Ochoa is a 81 y.o. female with medical history significant of anxiety, depression, cataracts, chronic constipation, osteoporosis, seizure disorder with history of last seizure in 1977 who had a mechanical fall after losing her balance while she was holding the door for a neighbor. Patient is s/p ORIF L hip fracture. WBAT  ? ?Clinical Impression ?  ?Pt was functioning independently and living at [facility]. She presents with L hip pain, anxiety, generalized weakness and impaired standing balance. She needs min to mod assist for mobility and set up to max assist for ADLs. Pt will need post acute rehab in SNF upon discharge. Will follow acutely.  ?   ? ?Recommendations for follow up therapy are one component of a multi-disciplinary discharge planning process, led by the attending physician.  Recommendations may be updated based on patient status, additional functional criteria and insurance authorization.  ? ?Follow Up Recommendations ? Skilled nursing-short term rehab (<3 hours/day)  ?  ?Assistance Recommended at Discharge Frequent or constant Supervision/Assistance  ?Patient can return home with the following A little help with walking and/or transfers;A lot of help with bathing/dressing/bathroom;Assistance with cooking/housework;Assist for transportation;Help with stairs or ramp for entrance ? ?  ?Functional Status Assessment ? Patient has had a recent decline in their functional status and demonstrates the ability to make significant improvements in function in a reasonable and predictable amount of time.  ?Equipment Recommendations ? Other (comment) (defer to next venue)  ?  ?Recommendations for Other Services   ? ? ?  ?Precautions / Restrictions Precautions ?Precautions: Fall ?Restrictions ?Weight Bearing Restrictions: Yes ?LLE Weight  Bearing: Weight bearing as tolerated  ? ?  ? ?Mobility Bed Mobility ?Overal bed mobility: Needs Assistance ?Bed Mobility: Sit to Supine ?  ?  ?  ?Sit to supine: Mod assist ?  ?General bed mobility comments: assist for LEs back into bed ?  ? ?Transfers ?Overall transfer level: Needs assistance ?Equipment used: Rolling walker (2 wheels) ?Transfers: Sit to/from Stand, Bed to chair/wheelchair/BSC ?Sit to Stand: Mod assist ?Stand pivot transfers: Min assist ?  ?  ?  ?  ?General transfer comment: cues for technique, assist to rise and steady ?  ? ?  ?Balance Overall balance assessment: Needs assistance ?  ?Sitting balance-Leahy Scale: Fair ?  ?  ?Standing balance support: Bilateral upper extremity supported, During functional activity, Reliant on assistive device for balance ?Standing balance-Leahy Scale: Poor ?Standing balance comment: reliant on AD and min assist ?  ?  ?  ?  ?  ?  ?  ?  ?  ?  ?  ?   ? ?ADL either performed or assessed with clinical judgement  ? ?ADL Overall ADL's : Needs assistance/impaired ?Eating/Feeding: Independent;Sitting ?  ?Grooming: Set up;Sitting ?  ?Upper Body Bathing: Minimal assistance;Sitting ?  ?Lower Body Bathing: Maximal assistance;Sit to/from stand ?  ?Upper Body Dressing : Minimal assistance;Sitting ?  ?Lower Body Dressing: Maximal assistance;Sit to/from stand ?  ?  ?  ?Toileting- Clothing Manipulation and Hygiene: Maximal assistance;Sit to/from stand ?  ?  ?  ?  ?   ? ? ? ?Vision Ability to See in Adequate Light: 0 Adequate ?Patient Visual Report: No change from baseline ?   ?   ?Perception   ?  ?Praxis   ?  ? ?Pertinent Vitals/Pain Pain Assessment ?Pain Assessment: Faces ?Faces Pain Scale: Hurts whole lot ?  Pain Location: L hip with movement ?Pain Descriptors / Indicators: Discomfort, Grimacing, Guarding, Moaning, Sore ?Pain Intervention(s): Monitored during session, Repositioned, Ice applied  ? ? ? ?Hand Dominance Right ?  ?Extremity/Trunk Assessment Upper Extremity Assessment ?Upper  Extremity Assessment: Overall WFL for tasks assessed ?  ?Lower Extremity Assessment ?Lower Extremity Assessment: Defer to PT evaluation ?LLE: Unable to fully assess due to pain ?LLE Coordination: decreased gross motor ?  ?Cervical / Trunk Assessment ?Cervical / Trunk Assessment: Normal ?  ?Communication Communication ?Communication: No difficulties ?  ?Cognition Arousal/Alertness: Awake/alert ?Behavior During Therapy: Anxious ?Overall Cognitive Status: Within Functional Limits for tasks assessed ?  ?  ?  ?  ?  ?  ?  ?  ?  ?  ?  ?  ?  ?  ?  ?  ?  ?  ?  ?General Comments    ? ?  ?Exercises   ?  ?Shoulder Instructions    ? ? ?Home Living Family/patient expects to be discharged to:: Skilled nursing facility ?  ?  ?  ?  ?  ?  ?  ?  ?  ?  ?  ?  ?  ?  ?  ?  ?  ?  ? ?  ?Prior Functioning/Environment Prior Level of Function : Independent/Modified Independent ?  ?  ?  ?  ?  ?  ?Mobility Comments: patient did not use AD prior to admission. Lives at [facility] ?ADLs Comments: independent ?  ? ?  ?  ?OT Problem List: Decreased strength;Impaired balance (sitting and/or standing);Decreased knowledge of use of DME or AE;Pain ?  ?   ?OT Treatment/Interventions: Self-care/ADL training;DME and/or AE instruction;Patient/family education;Balance training;Therapeutic activities  ?  ?OT Goals(Current goals can be found in the care plan section) Acute Rehab OT Goals ?OT Goal Formulation: With patient ?Time For Goal Achievement: 05/24/21 ?Potential to Achieve Goals: Good ?ADL Goals ?Pt Will Perform Grooming: standing;with min assist ?Pt Will Perform Lower Body Bathing: with min assist;sit to/from stand;with adaptive equipment ?Pt Will Perform Lower Body Dressing: with min assist;sit to/from stand;with adaptive equipment ?Pt Will Transfer to Toilet: with min assist;ambulating;bedside commode ?Pt Will Perform Toileting - Clothing Manipulation and hygiene: with min assist;sit to/from stand ?Additional ADL Goal #1: Pt will perform bed  mobility with min assist in preparation for ADLs.  ?OT Frequency: Min 2X/week ?  ? ?Co-evaluation   ?  ?  ?  ?  ? ?  ?AM-PAC OT "6 Clicks" Daily Activity     ?Outcome Measure Help from another person eating meals?: None ?Help from another person taking care of personal grooming?: A Little ?  ?Help from another person bathing (including washing, rinsing, drying)?: A Lot ?Help from another person to put on and taking off regular upper body clothing?: A Little ?Help from another person to put on and taking off regular lower body clothing?: A Lot ?6 Click Score: 14 ?  ?End of Session Equipment Utilized During Treatment: Rolling walker (2 wheels);Gait belt ? ?Activity Tolerance: Patient tolerated treatment well ?Patient left: in bed;with call bell/phone within reach;with bed alarm set ? ?OT Visit Diagnosis: Unsteadiness on feet (R26.81);Other abnormalities of gait and mobility (R26.89);Pain  ?              ?Time: [id number] ?OT Time Calculation (min): 20 min ?Charges:  OT General Charges ?$OT Visit: 1 Visit ?OT Evaluation ?$OT Eval Moderate Complexity: 1 Mod ? ?Nestor Lewandowsky, OTR/L ?Acute Rehabilitation Services ?Pager: [phone] ?Office: [phone]  ? ?  Erin Ochoa ?05/10/2021, 3:31 PM ?

## 2021-05-11 DIAGNOSIS — S72002A Fracture of unspecified part of neck of left femur, initial encounter for closed fracture: Secondary | ICD-10-CM | POA: Diagnosis not present

## 2021-05-11 LAB — BASIC METABOLIC PANEL
Anion gap: 5 (ref 5–15)
BUN: 10 mg/dL (ref 8–23)
CO2: 28 mmol/L (ref 22–32)
Calcium: 8.4 mg/dL — ABNORMAL LOW (ref 8.9–10.3)
Chloride: 107 mmol/L (ref 98–111)
Creatinine, Ser: 0.59 mg/dL (ref 0.44–1.00)
GFR, Estimated: >60 mL/min (ref >60)
Glucose, Bld: 121 mg/dL — ABNORMAL HIGH (ref 70–99)
Potassium: 3.6 mmol/L (ref 3.5–5.1)
Sodium: 140 mmol/L (ref 135–145)

## 2021-05-11 LAB — GLUCOSE, CAPILLARY
Glucose-Capillary: 129 mg/dL — ABNORMAL HIGH (ref 70–99)
Glucose-Capillary: 170 mg/dL — ABNORMAL HIGH (ref 70–99)
Glucose-Capillary: 121 mg/dL — ABNORMAL HIGH (ref 70–99)
Glucose-Capillary: 139 mg/dL — ABNORMAL HIGH (ref 70–99)

## 2021-05-11 LAB — CBC
HCT: 29.4 % — ABNORMAL LOW (ref 36.0–46.0)
Hemoglobin: 9.7 g/dL — ABNORMAL LOW (ref 12.0–15.0)
MCH: 32.6 pg (ref 26.0–34.0)
MCHC: 33 g/dL (ref 30.0–36.0)
MCV: 98.7 fL (ref 80.0–100.0)
Platelets: 146 10*3/uL — ABNORMAL LOW (ref 150–400)
RBC: 2.98 MIL/uL — ABNORMAL LOW (ref 3.87–5.11)
RDW: 13.4 % (ref 11.5–15.5)
WBC: 7.7 10*3/uL (ref 4.0–10.5)
nRBC: 0 % (ref 0.0–0.2)

## 2021-05-11 MED ORDER — PEG 3350-KCL-NABCB-NACL-NASULF 236 G PO SOLR
100.0000 mL | Freq: Every day | ORAL | Status: DC
  Filled 2021-05-11: qty 100

## 2021-05-11 MED ORDER — LIDOCAINE VISCOUS HCL 2 % MT SOLN
15.0000 mL | Freq: Four times a day (QID) | OROMUCOSAL | Status: DC | PRN
  Filled 2021-05-11: qty 15

## 2021-05-11 NOTE — Progress Notes (Signed)
?PROGRESS NOTE ? ?Erin Ochoa  ?DOB: [birth year]  ?PCP: Mast, Man X, NP ?[id number]  ?DOA: 05/09/2021 ? LOS: 2 days  ?Hospital Day: 3 ? ?Brief narrative: ?Erin Ochoa is a 81 y.o. female with PMH significant for anxiety, depression, cataracts, chronic constipation, osteoporosis, seizure disorder ?Patient was brought to the ED on 3/23 after of fall at home from losing her balance while she was holding the door for her neighbor. ?In the ED, patient was hemodynamically stable, labs unremarkable ?Left hip x-ray showed an acute nondisplaced intertrochanteric fracture of the left femur ?Admitted to hospital service orthopedic consulted ? ?Subjective: ?Patient was seen and examined this morning. ?Trying to use bedside commode.  Has significant limitation in mobility because of fracture and partial weightbearing status ?Reports chronic constipation and uses GoLytely daily ? ?Principal Problem: ?  Closed left hip fracture, initial encounter ([name]) ?Active Problems: ?  Epilepsy without status epilepticus, not intractable ([name]) ?  Pre-diabetes ?  Hyperlipidemia ?  ? ?Assessment and Plan: ?Closed left hip fracture ?Osteoporosis ?-Secondary to fall from losing her balance. ?-3/23, underwent ORIF by Dr. August Saucer ?-PT eval was obtained.  SNF recommended. ?-Pain meds and DVT prophylaxis per orthopedics.  It seems orthopedics has started the patient on aspirin 81 mg twice daily for DVT prophylaxis. ?-Continue vitamin D supplement. ? ?History of epilepsy  ?-Last seizure reportedly several years ago.   ?-Continue carbamazepine 300 mg p.o. at bedtime. ?-Follow-up with neurology and/or PCP as an outpatient ?  ?Pre-diabetes ?Hyperglycemia ?-A1c 5.7 recently.   ?-Continue CBG monitoring ?Recent Labs  ?Lab 05/10/21 ?1153 05/10/21 ?1512 05/10/21 ?2134 05/11/21 ?2703 05/11/21 ?1110  ?GLUCAP 133* 141* 153* 129* 170*  ? ?Hyperlipidemia ?-Continue atorvastatin 10 mg p.o. daily. ? ?Anxiety/depression ?-Klonopin 1 mg at bedtime as  needed ? ?Underweight ?-Body mass index is 17.23 kg/m?.  ?-Dietitian consulted ? ?Chronic constipation ?-Uses GoLytely 100 mill daily.  Resume the same.  Also continue Senokot, as needed MiraLAX ? ?Goals of care ?  Code Status: Full Code  ? ? ?Mobility: PT eval obtained SNF recommended ? ?Nutritional status:  ?Body mass index is 17.23 kg/m?Marland Kitchen  ?Nutrition Problem: Increased nutrient needs ?Etiology: hip fracture, post-op healing ?Signs/Symptoms: estimated needs ? ? ? ? ?Diet:  ?Diet Order   ? ?       ?  Diet regular Room service appropriate? Yes; Fluid consistency: Thin  Diet effective now       ?  ? ?  ?  ? ?  ? ? ?DVT prophylaxis:  ?SCDs Start: 05/09/21 2158 ?SCDs Start: 05/09/21 2158 ?SCDs Start: 05/09/21 2158 ?Place TED hose Start: 05/09/21 2158 ?  ?Antimicrobials: None ?Fluid: Not on IV fluid ?Consultants: Orthopedics ?Family Communication: None at bedside ? ?Status is: Inpatient ? ?Continue in-hospital care because: POD1 ?Level of care: Telemetry Medical  ? ?Dispo: [facility] ?             Anticipated d/c is to: SNF ?             Patient currently is not medically stable to d/c. ?  Difficult to place patient No ? ?Infusions:  ? methocarbamol (ROBAXIN) IV    ? ? ?Scheduled Meds: ? acetaminophen  500 mg Oral Q6H  ? aspirin EC  81 mg Oral BID  ? atorvastatin  5 mg Oral Daily  ? carbamazepine  300 mg Oral QHS  ? docusate sodium  100 mg Oral BID  ? feeding supplement  237 mL  Oral BID BM  ? metoCLOPramide  5 mg Oral TID AC  ? multivitamin with minerals  1 tablet Oral Daily  ? [START ON 05/12/2021] polyethylene glycol  100 mL Oral Q0600  ? polyvinyl alcohol  1 drop Both Eyes QID  ? senna-docusate  1 tablet Oral BID  ? Vitamin D (Ergocalciferol)  50,000 Units Oral Weekly  ? ? ?PRN meds: ?acetaminophen, clonazePAM, feeding supplement, HYDROcodone-acetaminophen, HYDROmorphone (DILAUDID) injection, menthol-cetylpyridinium **OR** phenol, methocarbamol **OR** methocarbamol  (ROBAXIN) IV, metoCLOPramide **OR** metoCLOPramide (REGLAN) injection, morphine injection, ondansetron **OR** ondansetron (ZOFRAN) IV, oxyCODONE  ? ?Antimicrobials: ?Anti-infectives (From admission, onward)  ? ? Start     Dose/Rate Route Frequency Ordered Stop  ? 05/10/21 0600  ceFAZolin (ANCEF) IVPB 2g/100 mL premix       ? 2 g ?200 mL/hr over 30 Minutes Intravenous On call to O.R. 05/09/21 1648 05/09/21 1925  ? 05/10/21 0300  ceFAZolin (ANCEF) IVPB 2g/100 mL premix       ? 2 g ?200 mL/hr over 30 Minutes Intravenous Every 8 hours 05/09/21 2158 05/10/21 1658  ? ?  ? ? ?Objective: ?Vitals:  ? 05/11/21 0603 05/11/21 0715  ?BP: (!) 127/56 (!) 116/56  ?Pulse: 99 96  ?Resp: 18 16  ?Temp: 98.3 ?F (36.8 ?C) 99.2 ?F (37.3 ?C)  ?SpO2: 96% 96%  ? ? ?Intake/Output Summary (Last 24 hours) at 05/11/2021 1332 ?Last data filed at 05/11/2021 0600 ?Gross per 24 hour  ?Intake 602.18 ml  ?Output 1500 ml  ?Net -897.82 ml  ? ?Filed Weights  ? 05/09/21 1314  ?Weight: 49.9 kg  ? ?Weight change:  ?Body mass index is 17.23 kg/m?.  ? ?Physical Exam: ?General exam: Pleasant, elderly Caucasian female.  Partially controlled pain ?Skin: No rashes, lesions or ulcers. ?HEENT: Atraumatic, normocephalic, no obvious bleeding ?Lungs: Clear to auscultation bilaterally ?CVS: Regular rate and rhythm, no murmur ?GI/Abd soft, nontender, nondistended, bowel sound present ?CNS: Alert, awake, oriented x3 ?Psychiatry: Mood appropriate ?Extremities: No pedal edema, no calf tenderness ? ?Data Review: I have personally reviewed the laboratory data and studies available. ? ?F/u labs ordered ?Unresulted Labs (From admission, onward)  ? ?  Start     Ordered  ? 05/10/21 0500  CBC  Daily,   R     ? 05/09/21 2158  ? ?  ?  ? ?  ? ? ?Signed, ?Lorin Glass, MD ?[facility] ?05/11/2021 ? ? ? ? ? ? ? ? ? ? ? ? ?

## 2021-05-11 NOTE — Plan of Care (Signed)

## 2021-05-12 DIAGNOSIS — S72002A Fracture of unspecified part of neck of left femur, initial encounter for closed fracture: Secondary | ICD-10-CM | POA: Diagnosis not present

## 2021-05-12 LAB — CBC
HCT: 29.2 % — ABNORMAL LOW (ref 36.0–46.0)
Hemoglobin: 9.4 g/dL — ABNORMAL LOW (ref 12.0–15.0)
MCH: 32.1 pg (ref 26.0–34.0)
MCHC: 32.2 g/dL (ref 30.0–36.0)
MCV: 99.7 fL (ref 80.0–100.0)
Platelets: 152 10*3/uL (ref 150–400)
RBC: 2.93 MIL/uL — ABNORMAL LOW (ref 3.87–5.11)
RDW: 13.2 % (ref 11.5–15.5)
WBC: 7.6 10*3/uL (ref 4.0–10.5)
nRBC: 0 % (ref 0.0–0.2)

## 2021-05-12 LAB — GLUCOSE, CAPILLARY
Glucose-Capillary: 117 mg/dL — ABNORMAL HIGH (ref 70–99)
Glucose-Capillary: 166 mg/dL — ABNORMAL HIGH (ref 70–99)
Glucose-Capillary: 189 mg/dL — ABNORMAL HIGH (ref 70–99)
Glucose-Capillary: 171 mg/dL — ABNORMAL HIGH (ref 70–99)

## 2021-05-12 MED ORDER — POLYETHYLENE GLYCOL 3350 17 G PO PACK
34.0000 g | PACK | Freq: Once | ORAL | Status: AC
  Administered 2021-05-12: 34 g via ORAL
  Filled 2021-05-12: qty 2

## 2021-05-12 MED ORDER — SENNOSIDES-DOCUSATE SODIUM 8.6-50 MG PO TABS
2.0000 | ORAL_TABLET | Freq: Two times a day (BID) | ORAL | Status: DC
  Administered 2021-05-12 – 2021-05-13 (×2): 2 via ORAL
  Filled 2021-05-12 (×2): qty 2

## 2021-05-12 MED ORDER — LACTULOSE ENEMA
300.0000 mL | Freq: Once | RECTAL | Status: DC | PRN
Start: 2021-05-12 — End: 2021-05-13
  Filled 2021-05-12: qty 300

## 2021-05-12 NOTE — Progress Notes (Signed)
?PROGRESS NOTE ? ?Erin Ochoa  ?DOB: 11-11-1940  ?PCP: Mast, Man X, NP ?[id number]  ?DOA: 05/09/2021 ? LOS: 3 days  ?Hospital Day: 4 ? ?Brief narrative: ?Erin Ochoa is a 81 y.o. female with PMH significant for anxiety, depression, cataracts, chronic constipation, osteoporosis, seizure disorder ?Patient was brought to the ED on 3/23 after of fall at home from losing her balance while she was holding the door for her neighbor. ?In the ED, patient was hemodynamically stable, labs unremarkable ?Left hip x-ray showed an acute nondisplaced intertrochanteric fracture of the left femur ?Admitted to hospital service orthopedic consulted ? ?Subjective: ?Patient was seen and examined this morning. ?Frustrated because she has not had a bowel movement yet.  Yesterday I ordered for GoLytely 100 mill daily as she was taking before but apparently at the inpatient pharmacy does not have it. ? ?Principal Problem: ?  Closed left hip fracture, initial encounter ([facility]) ?Active Problems: ?  Epilepsy without status epilepticus, not intractable ([facility]) ?  Pre-diabetes ?  Hyperlipidemia ?  ? ?Assessment and Plan: ?Closed left hip fracture ?Osteoporosis ?-Secondary to fall from losing her balance. ?-3/23, underwent ORIF by Dr. Marlou Sa ?-PT eval was obtained.  SNF recommended. ?-Pain meds and DVT prophylaxis per orthopedics.  It seems orthopedics has started the patient on aspirin 81 mg twice daily for DVT prophylaxis. ?-Continue vitamin D supplement. ? ?History of epilepsy  ?-Last seizure reportedly several years ago.   ?-Continue carbamazepine 300 mg p.o. at bedtime. ?-Follow-up with neurology and/or PCP as an outpatient ?  ?Pre-diabetes ?Hyperglycemia ?-A1c 5.7 recently.   ?-Continue CBG monitoring ?Recent Labs  ?Lab 05/11/21 ?1110 05/11/21 ?1622 05/11/21 ?[id number] 05/12/21 ?0804 05/12/21 ?1126  ?GLUCAP 170* 121* 139* 117* 166*  ? ? ?Hyperlipidemia ?-Continue atorvastatin 10 mg p.o. daily. ? ?Anxiety/depression ?-Klonopin 1 mg at bedtime as  needed ? ?Underweight ?-Body mass index is 17.23 kg/m?.  ?-Dietitian consulted ? ?Chronic constipation ?-Uses GoLytely 100 mill daily.  Inpatient pharmacy does not have it.  I put the order for MiraLAX 34 g 1 dose and lactulose enema as needed.  Also make Senokot scheduled twice daily. ?Goals of care ?  Code Status: Full Code  ? ? ?Mobility: PT eval obtained SNF recommended ? ?Nutritional status:  ?Body mass index is 17.23 kg/m?Marland Kitchen  ?Nutrition Problem: Increased nutrient needs ?Etiology: hip fracture, post-op healing ?Signs/Symptoms: estimated needs ? ? ? ? ?Diet:  ?Diet Order   ? ?       ?  Diet regular Room service appropriate? Yes; Fluid consistency: Thin  Diet effective now       ?  ? ?  ?  ? ?  ? ? ?DVT prophylaxis:  ?SCDs Start: 05/09/21 2158 ?SCDs Start: 05/09/21 2158 ?SCDs Start: 05/09/21 2158 ?Place TED hose Start: 05/09/21 2158 ?  ?Antimicrobials: None ?Fluid: Not on IV fluid ?Consultants: Orthopedics ?Family Communication: None at bedside ? ?Status is: Inpatient ? ?Continue in-hospital care because: POD 2, no BM yet. ?Level of care: Telemetry Medical  ? ?Dispo: [facility] ?             Anticipated d/c is to: SNF ?             Patient currently is not medically stable to d/c. ?  Difficult to place patient No ? ?Infusions:  ? methocarbamol (ROBAXIN) IV    ? ? ?Scheduled Meds: ? aspirin EC  81 mg Oral BID  ? atorvastatin  5 mg Oral Daily  ?  carbamazepine  300 mg Oral QHS  ? feeding supplement  237 mL Oral BID BM  ? metoCLOPramide  5 mg Oral TID AC  ? multivitamin with minerals  1 tablet Oral Daily  ? polyvinyl alcohol  1 drop Both Eyes QID  ? senna-docusate  2 tablet Oral BID  ? Vitamin D (Ergocalciferol)  50,000 Units Oral Weekly  ? ? ?PRN meds: ?acetaminophen, clonazePAM, feeding supplement, HYDROcodone-acetaminophen, HYDROmorphone (DILAUDID) injection, lactulose, lidocaine, menthol-cetylpyridinium **OR** phenol, methocarbamol **OR** methocarbamol (ROBAXIN)  IV, metoCLOPramide **OR** metoCLOPramide (REGLAN) injection, morphine injection, ondansetron **OR** ondansetron (ZOFRAN) IV, oxyCODONE  ? ?Antimicrobials: ?Anti-infectives (From admission, onward)  ? ? Start     Dose/Rate Route Frequency Ordered Stop  ? 05/10/21 0600  ceFAZolin (ANCEF) IVPB 2g/100 mL premix       ? 2 g ?200 mL/hr over 30 Minutes Intravenous On call to O.R. 05/09/21 1648 05/09/21 1925  ? 05/10/21 0300  ceFAZolin (ANCEF) IVPB 2g/100 mL premix       ? 2 g ?200 mL/hr over 30 Minutes Intravenous Every 8 hours 05/09/21 2158 05/10/21 1658  ? ?  ? ? ?Objective: ?Vitals:  ? 05/12/21 0547 05/12/21 0721  ?BP: 122/60 (!) 113/56  ?Pulse: 92 95  ?Resp:  14  ?Temp: 99.2 ?F (37.3 ?C) 98.6 ?F (37 ?C)  ?SpO2: 97% 95%  ? ?No intake or output data in the 24 hours ending 05/12/21 1403 ? ?Filed Weights  ? 05/09/21 1314  ?Weight: 49.9 kg  ? ?Weight change:  ?Body mass index is 17.23 kg/m?.  ? ?Physical Exam: ?General exam: Pleasant, elderly Caucasian female.  Partially controlled pain ?Skin: No rashes, lesions or ulcers. ?HEENT: Atraumatic, normocephalic, no obvious bleeding ?Lungs: Clear to auscultation bilaterally ?CVS: Regular rate and rhythm, no murmur ?GI/Abd soft, nontender, nondistended, bowel sound present ?CNS: Alert, awake, oriented x3 ?Psychiatry: Mood appropriate ?Extremities: No pedal edema, no calf tenderness ? ?Data Review: I have personally reviewed the laboratory data and studies available. ? ?F/u labs ordered ?Unresulted Labs (From admission, onward)  ? ? None  ? ?  ? ? ?Signed, ?Terrilee Croak, MD ?[facility] ?05/12/2021 ? ? ? ? ? ? ? ? ? ? ? ? ?

## 2021-05-13 ENCOUNTER — Encounter (HOSPITAL_COMMUNITY): Payer: Self-pay | Admitting: Orthopedic Surgery

## 2021-05-13 DIAGNOSIS — R110 Nausea: Secondary | ICD-10-CM | POA: Diagnosis not present

## 2021-05-13 DIAGNOSIS — G8918 Other acute postprocedural pain: Secondary | ICD-10-CM | POA: Diagnosis not present

## 2021-05-13 DIAGNOSIS — G40822 Epileptic spasms, not intractable, without status epilepticus: Secondary | ICD-10-CM | POA: Diagnosis not present

## 2021-05-13 DIAGNOSIS — R2681 Unsteadiness on feet: Secondary | ICD-10-CM | POA: Diagnosis not present

## 2021-05-13 DIAGNOSIS — R0789 Other chest pain: Secondary | ICD-10-CM | POA: Diagnosis not present

## 2021-05-13 DIAGNOSIS — S72002A Fracture of unspecified part of neck of left femur, initial encounter for closed fracture: Secondary | ICD-10-CM | POA: Diagnosis not present

## 2021-05-13 DIAGNOSIS — R0781 Pleurodynia: Secondary | ICD-10-CM | POA: Diagnosis not present

## 2021-05-13 DIAGNOSIS — R29898 Other symptoms and signs involving the musculoskeletal system: Secondary | ICD-10-CM | POA: Diagnosis not present

## 2021-05-13 DIAGNOSIS — I959 Hypotension, unspecified: Secondary | ICD-10-CM | POA: Diagnosis not present

## 2021-05-13 DIAGNOSIS — M419 Scoliosis, unspecified: Secondary | ICD-10-CM | POA: Diagnosis not present

## 2021-05-13 DIAGNOSIS — E785 Hyperlipidemia, unspecified: Secondary | ICD-10-CM | POA: Diagnosis not present

## 2021-05-13 DIAGNOSIS — M6281 Muscle weakness (generalized): Secondary | ICD-10-CM | POA: Diagnosis not present

## 2021-05-13 DIAGNOSIS — S72145D Nondisplaced intertrochanteric fracture of left femur, subsequent encounter for closed fracture with routine healing: Secondary | ICD-10-CM | POA: Diagnosis not present

## 2021-05-13 DIAGNOSIS — Z743 Need for continuous supervision: Secondary | ICD-10-CM | POA: Diagnosis not present

## 2021-05-13 DIAGNOSIS — Z9181 History of falling: Secondary | ICD-10-CM | POA: Diagnosis not present

## 2021-05-13 DIAGNOSIS — F419 Anxiety disorder, unspecified: Secondary | ICD-10-CM | POA: Diagnosis not present

## 2021-05-13 DIAGNOSIS — K5904 Chronic idiopathic constipation: Secondary | ICD-10-CM | POA: Diagnosis not present

## 2021-05-13 DIAGNOSIS — Z20822 Contact with and (suspected) exposure to covid-19: Secondary | ICD-10-CM | POA: Diagnosis not present

## 2021-05-13 DIAGNOSIS — S72002S Fracture of unspecified part of neck of left femur, sequela: Secondary | ICD-10-CM | POA: Diagnosis not present

## 2021-05-13 DIAGNOSIS — K3189 Other diseases of stomach and duodenum: Secondary | ICD-10-CM | POA: Diagnosis not present

## 2021-05-13 DIAGNOSIS — F5101 Primary insomnia: Secondary | ICD-10-CM | POA: Diagnosis not present

## 2021-05-13 DIAGNOSIS — D500 Iron deficiency anemia secondary to blood loss (chronic): Secondary | ICD-10-CM | POA: Diagnosis not present

## 2021-05-13 DIAGNOSIS — R4189 Other symptoms and signs involving cognitive functions and awareness: Secondary | ICD-10-CM | POA: Diagnosis not present

## 2021-05-13 DIAGNOSIS — R2689 Other abnormalities of gait and mobility: Secondary | ICD-10-CM | POA: Diagnosis not present

## 2021-05-13 DIAGNOSIS — R41841 Cognitive communication deficit: Secondary | ICD-10-CM | POA: Diagnosis not present

## 2021-05-13 DIAGNOSIS — K5909 Other constipation: Secondary | ICD-10-CM | POA: Diagnosis not present

## 2021-05-13 DIAGNOSIS — R419 Unspecified symptoms and signs involving cognitive functions and awareness: Secondary | ICD-10-CM | POA: Diagnosis not present

## 2021-05-13 DIAGNOSIS — R7303 Prediabetes: Secondary | ICD-10-CM | POA: Diagnosis not present

## 2021-05-13 DIAGNOSIS — M25552 Pain in left hip: Secondary | ICD-10-CM | POA: Diagnosis not present

## 2021-05-13 DIAGNOSIS — M810 Age-related osteoporosis without current pathological fracture: Secondary | ICD-10-CM | POA: Diagnosis not present

## 2021-05-13 DIAGNOSIS — G40909 Epilepsy, unspecified, not intractable, without status epilepticus: Secondary | ICD-10-CM | POA: Diagnosis not present

## 2021-05-13 LAB — GLUCOSE, CAPILLARY
Glucose-Capillary: 116 mg/dL — ABNORMAL HIGH (ref 70–99)
Glucose-Capillary: 128 mg/dL — ABNORMAL HIGH (ref 70–99)

## 2021-05-13 MED ORDER — SENNOSIDES-DOCUSATE SODIUM 8.6-50 MG PO TABS: 2.0000 | ORAL_TABLET | Freq: Two times a day (BID) | ORAL | Status: DC

## 2021-05-13 MED ORDER — HYDROCODONE-ACETAMINOPHEN 5-325 MG PO TABS: 1.0000 | ORAL_TABLET | ORAL | 0 refills | Status: AC | PRN

## 2021-05-13 MED ORDER — METHOCARBAMOL 500 MG PO TABS: 500.0000 mg | ORAL_TABLET | Freq: Four times a day (QID) | ORAL | 0 refills | Status: AC | PRN

## 2021-05-13 MED ORDER — ASPIRIN 81 MG PO TBEC: 81.0000 mg | DELAYED_RELEASE_TABLET | Freq: Two times a day (BID) | ORAL | Status: AC

## 2021-05-13 NOTE — TOC Transition Note (Addendum)
Transition of Care (TOC) - CM/SW Discharge Note ? ? ?Patient Details  ?Name: Darianna Amy ?MRN: [id number] ?Date of Birth: 29-Sep-1940 ? ?[facility]) CM/SW Contact:  ?Lorri Frederick, LCSW ?Phone Number: ?05/13/2021, 1:01 PM ? ? ?Clinical Narrative:  Pt discharging to [facility], [place] [facility] [place].  RN call [phone] for report.   ? ? ? ?Final next level of care: Skilled Nursing Facility ?Barriers to Discharge: Barriers Resolved ? ? ?Patient Goals and CMS Choice ?Patient states their goals for this hospitalization and ongoing recovery are:: walk ?CMS Medicare.gov Compare Post Acute Care list provided to::  (Pt is from [facility], wants to attend SNF there) ?  ? ?Discharge Placement ?  ?           ?Patient chooses bed at:  [facility]) ?Patient to be transferred to facility by: [facility] ?Name of family member notified: unable to reach friend Jomarie Longs ?Patient and family notified of of transfer: 05/13/21 ? ?Discharge Plan and Services ?In-house Referral: Clinical Social Work ?  ?Post Acute Care Choice: Skilled Nursing Facility          ?  ?  ?  ?  ?  ?  ?  ?  ?  ?  ? ?Social Determinants of Health (SDOH) Interventions ?  ? ? ?Readmission Risk Interventions ?   ? View : No data to display.  ?  ?  ?  ? ? ? ? ? ?

## 2021-05-13 NOTE — Care Management Important Message (Signed)
Important Message ? ?Patient Details  ?Name: Erin Ochoa ?MRN: [id number] ?Date of Birth: [birth year] ? ? ?Medicare Important Message Given:  Yes ? ? ? ? ?Marylene Land  [place] Lafond-Martin ?05/13/2021, 2:52 PM ?

## 2021-05-13 NOTE — Progress Notes (Signed)
Physical Therapy Treatment ?Patient Details ?Name: Erin Ochoa ?MRN: [id number] ?DOB: 1940-06-16 ?Today's Date: 05/13/2021 ? ? ?History of Present Illness 81 y.o. female with medical history significant of anxiety, depression, cataracts, chronic constipation, osteoporosis, seizure disorder with history of last seizure in 1977 who had a mechanical fall after losing her balance while she was holding the door for a neighbor. Patient is s/p ORIF L hip fracture. WBAT ? ?  ?PT Comments  ? ? Talked with pt about her concern for longer hallway she must navigate to return to her apt at [facility].  Pt sustained a fall on the premises holding a door for a friend, and talked with pt about maybe being able to get closer to the dining room in another apt.  However, she will be at home so to speak, and can practice her skills in a live environment.  Pt is planning to go to rehab today and will follow along with her as her stay permits.   ?Recommendations for follow up therapy are one component of a multi-disciplinary discharge planning process, led by the attending physician.  Recommendations may be updated based on patient status, additional functional criteria and insurance authorization. ? ?Follow Up Recommendations ? Skilled nursing-short term rehab (<3 hours/day) ?  ?  ?Assistance Recommended at Discharge Frequent or constant Supervision/Assistance  ?Patient can return home with the following A lot of help with walking and/or transfers;Help with stairs or ramp for entrance;Assist for transportation;Assistance with cooking/housework;A little help with bathing/dressing/bathroom ?  ?Equipment Recommendations ? None recommended by PT  ?  ?Recommendations for Other Services   ? ? ?  ?Precautions / Restrictions Precautions ?Precautions: Fall ?Precaution Comments: monitor for stability on LLE ?Restrictions ?Weight Bearing Restrictions: Yes ?LLE Weight Bearing: Weight bearing as tolerated  ?  ? ?Mobility ? Bed Mobility ?Overal bed mobility:  Needs Assistance ?  ?  ?  ?  ?  ?  ?General bed mobility comments: up in chair when PT arrived ?  ? ?Transfers ?Overall transfer level: Needs assistance ?  ?  ?  ?  ?  ?  ?  ?  ?General transfer comment: declined to stand ?  ? ?Ambulation/Gait ?  ?  ?  ?  ?  ?  ?  ?General Gait Details: pivot with OT to chair ? ? ?Stairs ?  ?  ?  ?  ?  ? ? ?Wheelchair Mobility ?  ? ?Modified Rankin (Stroke Patients Only) ?  ? ? ?  ?Balance   ?  ?  ?  ?  ?  ?  ?  ?  ?  ?  ?  ?  ?  ?  ?  ?  ?  ?  ?  ? ?  ?Cognition Arousal/Alertness: Awake/alert ?Behavior During Therapy: Anxious ?Overall Cognitive Status: Within Functional Limits for tasks assessed ?  ?  ?  ?  ?  ?  ?  ?  ?  ?  ?  ?  ?  ?  ?  ?  ?  ?  ?  ? ?  ?Exercises Total Joint Exercises ?Ankle Circles/Pumps: AAROM, 5 reps ?General Exercises - Lower Extremity ?Quad Sets: AROM, 10 reps ?Gluteal Sets: AROM, 10 reps ?Long Arc Quad: Strengthening, 10 reps ?Heel Slides: Strengthening, 10 reps ?Hip ABduction/ADduction: AAROM, 10 reps, AROM ? ?  ?General Comments General comments (skin integrity, edema, etc.): Pt was assisted to do exercises on B LE's with help for L hip esp to abd/add ?  ?  ? ?  Pertinent Vitals/Pain Pain Assessment ?Pain Assessment: Faces ?Faces Pain Scale: Hurts little more ?Pain Location: L hip with movement ?Pain Descriptors / Indicators: Grimacing, Guarding, Sore ?Pain Intervention(s): Limited activity within patient's tolerance, Monitored during session, Premedicated before session, Repositioned  ? ? ?Home Living   ?  ?  ?  ?  ?  ?  ?  ?  ?  ?   ?  ?Prior Function    ?  ?  ?   ? ?PT Goals (current goals can now be found in the care plan section) Acute Rehab PT Goals ?Patient Stated Goal: to return to [facility] ? ?  ?Frequency ? ? ? Min 3X/week ? ? ? ?  ?PT Plan Current plan remains appropriate  ? ? ?Co-evaluation   ?  ?  ?  ?  ? ?  ?AM-PAC PT "6 Clicks" Mobility   ?Outcome Measure ? Help needed turning from your back to your side while in a flat bed without  using bedrails?: A Lot ?Help needed moving from lying on your back to sitting on the side of a flat bed without using bedrails?: A Lot ?Help needed moving to and from a bed to a chair (including a wheelchair)?: A Lot ?Help needed standing up from a chair using your arms (e.g., wheelchair or bedside chair)?: A Lot ?Help needed to walk in hospital room?: A Lot ?Help needed climbing 3-5 steps with a railing? : Total ?6 Click Score: 11 ? ?  ?End of Session   ?Activity Tolerance: Patient limited by pain;Patient limited by fatigue ?Patient left: in chair;with call bell/phone within reach;with chair alarm set ?Nurse Communication: Mobility status ?PT Visit Diagnosis: Unsteadiness on feet (R26.81);Other abnormalities of gait and mobility (R26.89);Muscle weakness (generalized) (M62.81);History of falling (Z91.81);Pain;Difficulty in walking, not elsewhere classified (R26.2) ?Pain - Right/Left: Left ?Pain - part of body: Hip ?  ? ? ?Time: [id number] ?PT Time Calculation (min) (ACUTE ONLY): 27 min ? ?Charges:  $Therapeutic Exercise: 23-37 mins    ?Ramond Dial ?05/13/2021, 5:15 PM ? ?Mee Hives, PT PhD ?Acute Rehab Dept. Number: [facility] [id number] and [place] [phone] ? ?

## 2021-05-13 NOTE — Discharge Summary (Signed)
? ?Physician Discharge Summary  ?Erin ChickBennie Muckleroy [id number] DOB: December 25, 1940 DOA: 05/09/2021 ? ?PCP: Mast, Man X, NP ? ?Admit date: 05/09/2021 ?Discharge date: 05/13/2021 ? ?Admitted From: Independent living facility ?Discharge disposition: Skilled nursing facility ? ?Brief narrative: ?Erin Ochoa is a 81 y.o. female with PMH significant for anxiety, depression, cataracts, chronic constipation, osteoporosis, seizure disorder ?Patient was brought to the ED on 3/23 after of fall at home from losing her balance while she was holding the door for her neighbor. ?In the ED, patient was hemodynamically stable, labs unremarkable ?Left hip x-ray showed an acute nondisplaced intertrochanteric fracture of the left femur ?Admitted to hospital service orthopedic consulted ? ?Subjective: ?Patient was seen and examined this morning. ?Sitting up in chair.  Feels better.  Had 2 bowel movements yesterday after MiraLAX and prune juice. ?Pain controlled. ? ?Principal Problem: ?  Closed left hip fracture, initial encounter ([name]) ?Active Problems: ?  Epilepsy without status epilepticus, not intractable ([name]) ?  Pre-diabetes ?  Hyperlipidemia ?  ? ?Assessment and Plan: ?Closed left hip fracture ?Osteoporosis ?-Secondary to fall from losing her balance. ?-3/23, underwent ORIF by Dr. August Saucerean ?-PT eval was obtained.  SNF recommended. ?-Per orthopedics, DVT prophylaxis with 81 mg of aspirin twice daily.   ?-Pain management with Norco as needed ?-Continue vitamin D supplement. ? ?History of epilepsy  ?-Last seizure reportedly several years ago.   ?-Continue carbamazepine 300 mg p.o. at bedtime. ?-Follow-up with neurology and/or PCP as an outpatient ?  ?Pre-diabetes ?Hyperglycemia ?-A1c 5.7 recently.   ?-Continue CBG monitoring ?Recent Labs  ?Lab 05/12/21 ?0804 05/12/21 ?1126 05/12/21 ?1609 05/12/21 ?2113 05/13/21 ?0802  ?GLUCAP 117* 166* 189* 171* 116*  ? ?Hyperlipidemia ?-Continue atorvastatin 10 mg p.o. daily. ? ?Anxiety/depression ?-Klonopin 1  mg at bedtime as needed ? ?Underweight ?-Body mass index is 17.23 kg/m?.  ?-Dietitian consulted ? ?Chronic constipation ?-Patient uses GoLytely 100 mill daily.  Inpatient pharmacy does not have it.  She was given MiraLAX and prune juice after which she had good bowel movement yesterday.  ?-Continue previous bowel regimen postdischarge. ? ?Goals of care ?  Code Status: Full Code  ? ? ? ? ?Wounds:  ?- ?Incision (Closed) 05/09/21 Hip Left (Active)  ?Date First Assessed/Time First Assessed: 05/09/21 2019   Location: Hip  Location Orientation: Left  ?  ?Assessments 05/09/2021  8:45 PM 05/13/2021  8:00 AM  ?Dressing Type Hydrocolloid Hydrocolloid  ?Dressing Clean, Dry, Intact Clean, Dry, Intact  ?Site / Wound Assessment Dressing in place / Unable to assess Dressing in place / Unable to assess  ?Drainage Amount None None  ?Treatment Ice applied --  ?   ?No Linked orders to display  ? ? ?Discharge Exam:  ? ?Vitals:  ? 05/12/21 1455 05/12/21 2355 05/13/21 0350 05/13/21 0759  ?BP: 113/63 115/63 111/62 (!) 113/55  ?Pulse: 88   79  ?Resp: 16 16 18 16   ?Temp: 98.3 ?F (36.8 ?C) 98.8 ?F (37.1 ?C) 98.2 ?F (36.8 ?C)   ?TempSrc: Oral Oral Oral   ?SpO2: 97% 97% 96% 100%  ?Weight:      ?Height:      ? ? ?Body mass index is 17.23 kg/m?.  ? ?General exam: Pleasant, elderly Caucasian female.  controlled pain ?Skin: No rashes, lesions or ulcers. ?HEENT: Atraumatic, normocephalic, no obvious bleeding ?Lungs: Clear to auscultation bilaterally ?CVS: Regular rate and rhythm, no murmur ?GI/Abd soft, nontender, nondistended, bowel sound present ?CNS: Alert, awake, oriented x3 ?Psychiatry: Mood appropriate ?Extremities: No pedal edema, no calf tenderness ? ?Follow  ups:  ? ? Follow-up Information   ? ? Mast, Man X, NP Follow up.   ?Specialty: Internal Medicine ?Contact information: ?[address] ?[place] [address] ?[phone] ? ? ?  ?  ? ?  ?  ? ?  ? ? ?Discharge Instructions:  ? ?Discharge Instructions   ? ? Call MD for:  difficulty  breathing, headache or visual disturbances   Complete by: As directed ?  ? Call MD for:  extreme fatigue   Complete by: As directed ?  ? Call MD for:  hives   Complete by: As directed ?  ? Call MD for:  persistant dizziness or light-headedness   Complete by: As directed ?  ? Call MD for:  persistant nausea and vomiting   Complete by: As directed ?  ? Call MD for:  severe uncontrolled pain   Complete by: As directed ?  ? Call MD for:  temperature >100.4   Complete by: As directed ?  ? Diet general   Complete by: As directed ?  ? Discharge instructions   Complete by: As directed ?  ? General discharge instructions: ?Follow with Primary MD Mast, Man X, NP in 7 days  ?Please request your PCP  to go over your hospital tests, procedures, radiology results at the follow up. Please get your medicines reviewed and adjusted.  Your PCP may decide to repeat certain labs or tests as needed. ?Do not drive, operate heavy machinery, perform activities at heights, swimming or participation in water activities or provide baby sitting services if your were admitted for syncope or siezures until you have seen by Primary MD or a Neurologist and advised to do so again. ?[place] Controlled Substance Reporting System database was reviewed. Do not drive, operate heavy machinery, perform activities at heights, swim, participate in water activities or provide baby-sitting services while on medications for pain, sleep and mood until your outpatient physician has reevaluated you and advised to do so again.  You are strongly recommended to comply with the dose, frequency and duration of prescribed medications. ?Activity: As tolerated with Full fall precautions use walker/cane & assistance as needed ?Avoid using any recreational substances like cigarette, tobacco, alcohol, or non-prescribed drug. ?If you experience worsening of your admission symptoms, develop shortness of breath, life threatening emergency, suicidal or homicidal thoughts  you must seek medical attention immediately by calling 911 or calling your MD immediately  if symptoms less severe. ?You must read complete instructions/literature along with all the possible adverse reactions/side effects for all the medicines you take and that have been prescribed to you. Take any new medicine only after you have completely understood and accepted all the possible adverse reactions/side effects.  ?Wear Seat belts while driving. ?You were cared for by a hospitalist during your hospital stay. If you have any questions about your discharge medications or the care you received while you were in the hospital after you are discharged, you can call the unit and ask to speak with the hospitalist or the covering physician. Once you are discharged, your primary care physician will handle any further medical issues. Please note that NO REFILLS for any discharge medications will be authorized once you are discharged, as it is imperative that you return to your primary care physician (or establish a relationship with a primary care physician if you do not have one).  ? Discharge wound care:   Complete by: As directed ?  ? Increase activity slowly   Complete by: As  directed ?  ? ?  ? ? ?Discharge Medications:  ? ?Allergies as of 05/13/2021   ? ?   Reactions  ? Cat Hair Extract Other (See Comments)  ? Patient states that it causes her eyes to swell shut  ? Cefaclor Other (See Comments)  ? unknown  ? Codeine Nausea Only  ? Fish Allergy Nausea Only  ? Iodinated Contrast Media Other (See Comments)  ? Shrimp caused stomach pain and nausea ?Per patient can eat fried oysters okay  ? No Healthtouch Food Allergies   ? Tomatoes, Red, Seeds causes Reflux  ? Other Nausea Only  ? Unknown allergy ?Pain medications, Pt states she does not know exactly which ones. Pt typically takes Tylenol for  pain.  ? Oxycodone-acetaminophen Nausea And Vomiting  ? Penicillins Itching  ? ?  ? ?  ?Medication List  ?  ? ?TAKE these medications    ? ?acetaminophen 500 MG tablet ?Commonly known as: TYLENOL ?Take 1,000 mg by mouth every 6 (six) hours as needed for mild pain. ?  ?aspirin 81 MG EC tablet ?Take 1 tablet (81 mg total) by mouth 2 (two) t

## 2021-05-13 NOTE — Plan of Care (Signed)

## 2021-05-14 ENCOUNTER — Non-Acute Institutional Stay (SKILLED_NURSING_FACILITY): Payer: Medicare Other | Admitting: Nurse Practitioner

## 2021-05-14 ENCOUNTER — Encounter: Payer: Self-pay | Admitting: Nurse Practitioner

## 2021-05-14 DIAGNOSIS — G40822 Epileptic spasms, not intractable, without status epilepticus: Secondary | ICD-10-CM | POA: Diagnosis not present

## 2021-05-14 DIAGNOSIS — S72002A Fracture of unspecified part of neck of left femur, initial encounter for closed fracture: Secondary | ICD-10-CM | POA: Diagnosis not present

## 2021-05-14 DIAGNOSIS — D500 Iron deficiency anemia secondary to blood loss (chronic): Secondary | ICD-10-CM | POA: Diagnosis not present

## 2021-05-14 DIAGNOSIS — E785 Hyperlipidemia, unspecified: Secondary | ICD-10-CM

## 2021-05-14 DIAGNOSIS — M810 Age-related osteoporosis without current pathological fracture: Secondary | ICD-10-CM

## 2021-05-14 DIAGNOSIS — K5904 Chronic idiopathic constipation: Secondary | ICD-10-CM

## 2021-05-14 DIAGNOSIS — F419 Anxiety disorder, unspecified: Secondary | ICD-10-CM | POA: Diagnosis not present

## 2021-05-14 DIAGNOSIS — R7303 Prediabetes: Secondary | ICD-10-CM

## 2021-05-14 NOTE — Assessment & Plan Note (Addendum)
no active seizures since last seen, failed generic Tegretol in the past, on Tegretol 354m qd, Clonazepam hs (GDR to stop on her own didn't cause active seizures, but flare up insomnia and anxiety). S/p Neurology  ?Update CMP/eGFR ?

## 2021-05-14 NOTE — Progress Notes (Signed)
?Location:   [facility] ?Nursing Home Room Number: 55 ?Place of Service:  SNF (31) ?Provider:  Chipper Oman NP ? ?Arfa Lamarca X, NP ? ?Patient Care Team: ?Rosaleigh Brazzel X, NP as PCP - General (Internal Medicine) ?Cillian Gwinner X, NP as Nurse Practitioner (Internal Medicine) ? ?Extended Emergency Contact Information ?Primary Emergency Contact: [name],[name] ? [place] ?Mobile Phone: [phone] ?Relation: Friend ? ?Code Status:  Full Code ?Goals of care: Advanced Directive information ? ?  05/11/2021  ? 11:00 AM  ?Advanced Directives  ?Type of Advance Directive Healthcare Power of Attorney  ? ? ? ?Chief Complaint  ?Patient presents with  ? Acute Visit  ?  medication review following hospitalization  ? ? ?HPI:  ?Pt is a 81 y.o. female seen today for an acute visit for medication review following hospital stay.  ? Hospitalized 05/09/21-05/13/21 for acute nondisplaced intertrochanteric fracture of the left femur sustained from fall from losing her balance while she was holding the door for her neighbor. S/p ORIF 05/09/21 by Dr. August Saucer, DVT prophylaxis ASA 81mg  bid, prn Norco, Methocarbamol for pain.  ? Post op anemia, Hgb 9.4 05/12/21 ? Seizures, no active seizures since last seen, failed generic Tegretol in the past, on Tegretol 300mg  qd, Clonazepam hs (GDR to stop on her own didn't cause active seizures, but flare up insomnia and anxiety). S/p Neurology  ?            Constipation, last BM yesterday,  on Golytely daily, Senokot S II bid.  ?            Hyperlipidemia, takes Atorvastatin 5mg  qd, LDL 79 08/15/20 ?            Prediabetic, Hgb a1c 5.7 04/25/21 ?            OP takes Reclast, t-score -3.6 09/13/19 ?            Insomnia, takes Clonazepam, failed GDR,  failed Ambien. TSH 3.2 12/21/20 ?            Erosive gastropathy, not taking PPI, Hgb 12.9 12/21/20,  Diet controls. ?  ? ?Past Medical History:  ?Diagnosis Date  ? Anxiety   ? Cataract   ? Chronic constipation   ? Depression   ? Osteoporosis   ? Seizures  ([name])   ? epilepsy  last seizure 1977  ? ?Past Surgical History:  ?Procedure Laterality Date  ? ABDOMINAL HYSTERECTOMY  1995  ? Dr. 09/15/19  ? CATARACT EXTRACTION Bilateral 12/2016  ? COLONOSCOPY  2012  ? patchy increased intraepithelial lymphocytes - draelos  ? ESOPHAGOGASTRODUODENOSCOPY    ? multiple  ? FISSURECTOMY    ? INTRAMEDULLARY (IM) NAIL INTERTROCHANTERIC Left 05/09/2021  ? Procedure: INTRAMEDULLARY (IM) NAIL INTERTROCHANTRIC;  Surgeon: 01/2017, MD;  Location: [facility] OR;  Service: Orthopedics;  Laterality: Left;  ? WRIST SURGERY Left   ? due to fracture  ? ? ?Allergies  ?Allergen Reactions  ? Cat Hair Extract Other (See Comments)  ?  Patient states that it causes her eyes to swell shut  ? Cefaclor Other (See Comments)  ?  unknown  ? Codeine Nausea Only  ? Fish Allergy Nausea Only  ? Iodinated Contrast Media Other (See Comments)  ?  Shrimp caused stomach pain and nausea ?Per patient can eat fried oysters okay ? ?  ? No Healthtouch Food Allergies   ?  Tomatoes, Red, Seeds causes Reflux ?  ? Other Nausea Only  ?  Unknown  allergy ?Pain medications, Pt states she does not know exactly which ones. Pt typically takes Tylenol for  pain.  ? Oxycodone-Acetaminophen Nausea And Vomiting  ? Penicillins Itching  ? ? ?Allergies as of 05/14/2021   ? ?   Reactions  ? Cat Hair Extract Other (See Comments)  ? Patient states that it causes her eyes to swell shut  ? Cefaclor Other (See Comments)  ? unknown  ? Codeine Nausea Only  ? Fish Allergy Nausea Only  ? Iodinated Contrast Media Other (See Comments)  ? Shrimp caused stomach pain and nausea ?Per patient can eat fried oysters okay  ? No Healthtouch Food Allergies   ? Tomatoes, Red, Seeds causes Reflux  ? Other Nausea Only  ? Unknown allergy ?Pain medications, Pt states she does not know exactly which ones. Pt typically takes Tylenol for  pain.  ? Oxycodone-acetaminophen Nausea And Vomiting  ? Penicillins Itching  ? ?  ? ?  ?Medication List  ?  ? ?  ? Accurate as  of May 14, 2021  4:23 PM. If you have any questions, ask your nurse or doctor.  ?  ?  ? ?  ? ?acetaminophen 500 MG tablet ?Commonly known as: TYLENOL ?Take 1,000 mg by mouth every 6 (six) hours as needed for mild pain. ?  ?aspirin 81 MG EC tablet ?Take 1 tablet (81 mg total) by mouth 2 (two) times daily. Swallow whole. ?  ?atorvastatin 10 MG tablet ?Commonly known as: LIPITOR ?TAKE (1/2) TABLET DAILY. ?  ?CALTRATE 600+D PO ?Take 1 tablet by mouth 2 (two) times daily. Chewable 11 am and 8:30 pm ?  ?clonazePAM 1 MG tablet ?Commonly known as: KLONOPIN ?TAKE ONE TABLET BY MOUTH AT BEDTIME ?  ?Ensure Active High Protein Liqd ?Take 237 mLs by mouth daily as needed (supplement). ?  ?Golytely 236 g solution ?Generic drug: polyethylene glycol ?MIX WITH WATER AND DRINK 3-3 1/2 OUNCES DAILY AS DIRECTED. ?  ?HYDROcodone-acetaminophen 5-325 MG tablet ?Commonly known as: NORCO/VICODIN ?Take 1 tablet by mouth every 4 (four) hours as needed for up to 5 days for moderate pain (pain score 4-6). ?  ?methocarbamol 500 MG tablet ?Commonly known as: ROBAXIN ?Take 1 tablet (500 mg total) by mouth every 6 (six) hours as needed for up to 5 days for muscle spasms. ?  ?PRESERVISION AREDS PO ?Take 1 tablet by mouth 2 (two) times daily. ?  ?senna-docusate 8.6-50 MG tablet ?Commonly known as: Senokot-S ?Take 2 tablets by mouth 2 (two) times daily. ?  ?Soothe XP Soln ?Place 1 drop into both eyes in the morning, at noon, in the evening, and at bedtime. ?  ?TEGretol 200 MG tablet ?Generic drug: carbamazepine ?TAKE 1&1/2 TABLETS ONCE DAILY. ?  ?Vitamin D (Ergocalciferol) 1.25 MG (50000 UNIT) Caps capsule ?Commonly known as: DRISDOL ?TAKE 1 CAPSULE WEEKLY. ?  ? ?  ? ? ?Review of Systems  ?Constitutional:  Positive for fatigue. Negative for appetite change and fever.  ?HENT:  Positive for hearing loss. Negative for congestion and voice change.   ?Eyes:  Negative for visual disturbance.  ?Respiratory:  Negative for shortness of breath.    ?Cardiovascular:  Negative for leg swelling.  ?Gastrointestinal:  Negative for abdominal pain, constipation, nausea and vomiting.  ?Genitourinary:  Positive for frequency. Negative for dysuria and urgency.  ?     The patient stated she pushes her suprapubic region to help emptying her bladder  ?Musculoskeletal:  Positive for gait problem.  ?     Mid  back pain. Left hip/leg pain  ?Skin:   ?     Left hip surgical incision is covered in clean intact dressing, slightly swelling in the area.   ?Neurological:  Positive for numbness. Negative for seizures and weakness.  ?     Tingling/numbness left foot sometimes. Occasionally left hip/leg pain sometimes. Lightheaded when not sleeping well in lifetime.   ?Psychiatric/Behavioral:  Negative for behavioral problems and sleep disturbance. The patient is not nervous/anxious.   ?     Feels lightheaded if not sleep well at night.  ? ?Immunization History  ?Administered Date(s) Administered  ? Fluad Quad(high Dose 65+) 12/03/2020  ? Influenza Whole 11/19/2017  ? Influenza, High Dose Seasonal PF 11/26/2016, 12/01/2018, 11/30/2019  ? Influenza-Unspecified 11/18/2014, 11/29/2015  ? Moderna Covid-19 Vaccine Bivalent Booster 510yrs & up 11/06/2020  ? Moderna SARS-COV2 Booster Vaccination 12/27/2019, 06/26/2020  ? Moderna Sars-Covid-2 Vaccination 02/21/2019, 03/21/2019  ? Pneumococcal Conjugate-13 09/24/2017  ? Pneumococcal Polysaccharide-23 12/30/2012, 09/18/2018  ? Tdap 08/20/2017  ? Zoster Recombinat (Shingrix) 07/21/2017, 12/08/2017  ? Zoster, Live 05/28/2007  ? ?Pertinent  Health Maintenance Due  ?Topic Date Due  ? MAMMOGRAM  08/13/2017  ? URINE MICROALBUMIN  04/19/2021  ? HEMOGLOBIN A1C  10/26/2021  ? FOOT EXAM  04/11/2022  ? OPHTHALMOLOGY EXAM  05/07/2022  ? INFLUENZA VACCINE  Completed  ? DEXA SCAN  Completed  ? ? ?  05/11/2021  ? 11:00 AM 05/11/2021  ?  8:00 PM 05/12/2021  ?  7:39 AM 05/12/2021  ?  8:00 PM 05/13/2021  ?  8:00 AM  ?Fall Risk  ?Patient Fall Risk Level High fall risk  High fall risk High fall risk High fall risk High fall risk  ? ?Functional Status Survey: ?  ? ?Vitals:  ? 05/14/21 0853  ?BP: 122/60  ?Pulse: 88  ?Resp: 16  ?Temp: 98.2 ?F (36.8 ?C)  ?SpO2: 98%  ?Weight: 110 lb (49.

## 2021-05-14 NOTE — Assessment & Plan Note (Signed)
Takes Clonazepam 1mg  hs for sleep, failed GDR, failed Ambien.  ?

## 2021-05-14 NOTE — Assessment & Plan Note (Signed)
Hgb 9.4 05/12/21, add Ferrous Sulfate 325mg  MWF po with meal, repeat CBC/diff  ?

## 2021-05-14 NOTE — Assessment & Plan Note (Signed)
last BM yesterday,  on Golytely daily, Senokot S II bid.  ?

## 2021-05-14 NOTE — Assessment & Plan Note (Signed)
takes Reclast, t-score -3.6 09/13/19 

## 2021-05-14 NOTE — Assessment & Plan Note (Signed)
takes Atorvastatin 5mg qd, LDL 79 08/15/20 

## 2021-05-14 NOTE — Assessment & Plan Note (Signed)
Hgb a1c 5.7 04/25/21, continue diet control.  ?

## 2021-05-14 NOTE — Assessment & Plan Note (Signed)
Hospitalized 05/09/21-05/13/21 for acute nondisplaced intertrochanteric fracture of the left femur sustained from fall from losing her balance while she was holding the door for her neighbor. S/p ORIF 05/09/21 by Dr. August Saucer, DVT prophylaxis ASA 81mg  bid, prn Norco, Methocarbamol for pain.  ?In SNF [facility] for therapy.  ?

## 2021-05-15 ENCOUNTER — Telehealth: Payer: Self-pay | Admitting: *Deleted

## 2021-05-15 NOTE — Telephone Encounter (Signed)
Transition Care Management Unsuccessful Follow-up Telephone Call ? ?Date of discharge and from where:  05/13/2021 [facility] ? ?Attempts:  1st Attempt ? ?Reason for unsuccessful TCM follow-up call:  Unable to reach patient [facility] ? ?  ?

## 2021-05-16 LAB — CBC AND DIFFERENTIAL
HCT: 31 — AB (ref 36–46)
Hemoglobin: 10.2 — AB (ref 12.0–16.0)
Neutrophils Absolute: 4963
Platelets: 328 10*3/uL (ref 150–400)
WBC: 7.1

## 2021-05-16 LAB — BASIC METABOLIC PANEL
BUN: 14 (ref 4–21)
CO2: 27 — AB (ref 13–22)
Chloride: 103 (ref 99–108)
Creatinine: 0.6 (ref 0.5–1.1)
Glucose: 118
Potassium: 4.2 mEq/L (ref 3.5–5.1)
Sodium: 138 (ref 137–147)

## 2021-05-16 LAB — HEPATIC FUNCTION PANEL
ALT: 30 U/L (ref 7–35)
AST: 28 (ref 13–35)
Alkaline Phosphatase: 47 (ref 25–125)
Bilirubin, Total: 0.6

## 2021-05-16 LAB — CBC: RBC: 3.12 — AB (ref 3.87–5.11)

## 2021-05-16 LAB — COMPREHENSIVE METABOLIC PANEL
Albumin: 3.3 — AB (ref 3.5–5.0)
Calcium: 8.5 — AB (ref 8.7–10.7)
Globulin: 1.9

## 2021-05-16 MED FILL — Clonidine HCl Inj (For Epidural Infusion) 100 MCG/ML: EPIDURAL | Qty: 100 | Status: AC

## 2021-05-17 ENCOUNTER — Encounter: Payer: Self-pay | Admitting: Nurse Practitioner

## 2021-05-17 ENCOUNTER — Non-Acute Institutional Stay (SKILLED_NURSING_FACILITY): Payer: Medicare Other | Admitting: Nurse Practitioner

## 2021-05-17 DIAGNOSIS — M810 Age-related osteoporosis without current pathological fracture: Secondary | ICD-10-CM | POA: Diagnosis not present

## 2021-05-17 DIAGNOSIS — K3189 Other diseases of stomach and duodenum: Secondary | ICD-10-CM

## 2021-05-17 DIAGNOSIS — K5904 Chronic idiopathic constipation: Secondary | ICD-10-CM

## 2021-05-17 DIAGNOSIS — R0789 Other chest pain: Secondary | ICD-10-CM | POA: Diagnosis not present

## 2021-05-17 DIAGNOSIS — G8918 Other acute postprocedural pain: Secondary | ICD-10-CM

## 2021-05-17 DIAGNOSIS — G40822 Epileptic spasms, not intractable, without status epilepticus: Secondary | ICD-10-CM

## 2021-05-17 DIAGNOSIS — F5101 Primary insomnia: Secondary | ICD-10-CM

## 2021-05-17 DIAGNOSIS — E785 Hyperlipidemia, unspecified: Secondary | ICD-10-CM

## 2021-05-17 DIAGNOSIS — M25552 Pain in left hip: Secondary | ICD-10-CM | POA: Diagnosis not present

## 2021-05-17 DIAGNOSIS — D500 Iron deficiency anemia secondary to blood loss (chronic): Secondary | ICD-10-CM

## 2021-05-17 DIAGNOSIS — R7303 Prediabetes: Secondary | ICD-10-CM

## 2021-05-17 NOTE — Progress Notes (Addendum)
?Location:   [facility] ?Nursing Home Room Number: 41A ?Place of Service:  SNF (31) ?Provider:  Aslan Himes X, NP ? ?[name] X, NP ? ?Patient Care Team: ?[name] X, NP as PCP - General (Internal Medicine) ?[name] X, NP as Nurse Practitioner (Internal Medicine) ? ?Extended Emergency Contact Information ?Primary Emergency Contact: Erin,[name] ? [place] ?Mobile Phone: [phone] ?Relation: Friend ? ?Code Status:  FULL CODE ?Goals of care: Advanced Directive information ? ?  05/11/2021  ? 11:00 AM  ?Advanced Directives  ?Type of Advance Directive Healthcare Power of Attorney  ? ? ? ?Chief Complaint  ?Patient presents with  ? Acute Visit  ?  Left lower rib cage pain.  ? ? ?HPI:  ?Pt is a 81 y.o. female seen today for an acute visit for left lower rib cage pain, 05/17/21 X-ray thoracic spine: no fx, pending L rib xray result.  ? ?  ?Hospitalized 05/09/21-05/13/21 for acute nondisplaced intertrochanteric fracture of the left femur sustained from fall from losing her balance while she was holding the door for her neighbor. S/p ORIF 05/09/21 by Dr. Marlou Sa, DVT prophylaxis ASA 81mg  bid, prn Norco, Methocarbamol for pain. WBAT ?            Post op anemia, Hgb 9.4 05/12/21<<10.2 05/16/21, on Iron ?            Seizures, no active seizures since last seen, failed generic Tegretol in the past, on Tegretol 300mg  qd, Clonazepam hs (GDR to stop on her own didn't cause active seizures, but flare up insomnia and anxiety). S/p Neurology  ?            Constipation, stable, on Golytely daily, Senokot S II bid.  ?            Hyperlipidemia, takes Atorvastatin 5mg  qd, LDL 79 08/15/20 ?            Prediabetic, Hgb a1c 5.7 04/25/21 ?            OP takes Reclast, t-score -3.6 09/13/19 ?            Insomnia, takes Clonazepam, failed GDR,  failed Ambien. TSH 3.2 12/21/20 ?            Erosive gastropathy, not taking PPI, Diet controls. ?  ? ?Past Medical History:  ?Diagnosis Date  ? Anxiety   ? Cataract   ? Chronic  constipation   ? Depression   ? Osteoporosis   ? Seizures ([place])   ? epilepsy  last seizure 1977  ? ?Past Surgical History:  ?Procedure Laterality Date  ? ABDOMINAL HYSTERECTOMY  1995  ? Dr. Edwyna Shell  ? CATARACT EXTRACTION Bilateral 12/2016  ? COLONOSCOPY  2012  ? patchy increased intraepithelial lymphocytes - draelos  ? ESOPHAGOGASTRODUODENOSCOPY    ? multiple  ? FISSURECTOMY    ? INTRAMEDULLARY (IM) NAIL INTERTROCHANTERIC Left 05/09/2021  ? Procedure: INTRAMEDULLARY (IM) NAIL INTERTROCHANTRIC;  Surgeon: Meredith Pel, MD;  Location: [facility];  Service: Orthopedics;  Laterality: Left;  ? WRIST SURGERY Left   ? due to fracture  ? ? ?Allergies  ?Allergen Reactions  ? Cat Hair Extract Other (See Comments)  ?  Patient states that it causes her eyes to swell shut  ? Cefaclor Other (See Comments)  ?  unknown  ? Codeine Nausea Only  ? Fish Allergy Nausea Only  ? Iodinated Contrast Media Other (See Comments)  ?  Shrimp caused stomach pain and nausea ?Per  patient can eat fried oysters okay ? ?  ? No Healthtouch Food Allergies   ?  Tomatoes, Red, Seeds causes Reflux ?  ? Other Nausea Only  ?  Unknown allergy ?Pain medications, Pt states she does not know exactly which ones. Pt typically takes Tylenol for  pain.  ? Oxycodone-Acetaminophen Nausea And Vomiting  ? Penicillins Itching  ? ? ?Allergies as of 05/17/2021   ? ?   Reactions  ? Cat Hair Extract Other (See Comments)  ? Patient states that it causes her eyes to swell shut  ? Cefaclor Other (See Comments)  ? unknown  ? Codeine Nausea Only  ? Fish Allergy Nausea Only  ? Iodinated Contrast Media Other (See Comments)  ? Shrimp caused stomach pain and nausea ?Per patient can eat fried oysters okay  ? No Healthtouch Food Allergies   ? Tomatoes, Red, Seeds causes Reflux  ? Other Nausea Only  ? Unknown allergy ?Pain medications, Pt states she does not know exactly which ones. Pt typically takes Tylenol for  pain.  ? Oxycodone-acetaminophen Nausea And Vomiting  ? Penicillins  Itching  ? ?  ? ?  ?Medication List  ?  ? ?  ? Accurate as of May 17, 2021 11:59 PM. If you have any questions, ask your nurse or doctor.  ?  ?  ? ?  ? ?acetaminophen 500 MG tablet ?Commonly known as: TYLENOL ?Take 1,000 mg by mouth every 6 (six) hours as needed for mild pain. ?  ?aspirin 81 MG EC tablet ?Take 1 tablet (81 mg total) by mouth 2 (two) times daily. Swallow whole. ?  ?atorvastatin 10 MG tablet ?Commonly known as: LIPITOR ?TAKE (1/2) TABLET DAILY. ?  ?CALTRATE 600+D PO ?Take 1 tablet by mouth 2 (two) times daily. Chewable 11 am and 8:30 pm ?  ?clonazePAM 1 MG tablet ?Commonly known as: KLONOPIN ?TAKE ONE TABLET BY MOUTH AT BEDTIME ?  ?Ensure Active High Protein Liqd ?Take 237 mLs by mouth daily as needed (supplement). ?  ?Golytely 236 g solution ?Generic drug: polyethylene glycol ?MIX WITH WATER AND DRINK 3-3 1/2 OUNCES DAILY AS DIRECTED. ?  ?HYDROcodone-acetaminophen 5-325 MG tablet ?Commonly known as: NORCO/VICODIN ?Take 1 tablet by mouth every 4 (four) hours as needed for up to 5 days for moderate pain (pain score 4-6). ?  ?Iron (Ferrous Sulfate) 325 (65 Fe) MG Tabs ?Take by mouth. Once A Day on Mon, Wed, Fri ?  ?methocarbamol 500 MG tablet ?Commonly known as: ROBAXIN ?Take 1 tablet (500 mg total) by mouth every 6 (six) hours as needed for up to 5 days for muscle spasms. ?  ?PRESERVISION AREDS PO ?Take 1 tablet by mouth 2 (two) times daily. ?  ?senna-docusate 8.6-50 MG tablet ?Commonly known as: Senokot-S ?Take 2 tablets by mouth 2 (two) times daily. ?  ?Soothe XP Soln ?Place 1 drop into both eyes in the morning, at noon, in the evening, and at bedtime. ?  ?TEGretol 200 MG tablet ?Generic drug: carbamazepine ?TAKE 1&1/2 TABLETS ONCE DAILY. ?  ?Vitamin D (Ergocalciferol) 1.25 MG (50000 UNIT) Caps capsule ?Commonly known as: DRISDOL ?TAKE 1 CAPSULE WEEKLY. ?  ? ?  ? ? ?Review of Systems  ?Constitutional:  Positive for fatigue. Negative for appetite change and fever.  ?HENT:  Positive for hearing loss.  Negative for congestion and voice change.   ?Eyes:  Negative for visual disturbance.  ?Respiratory:  Negative for shortness of breath.   ?Cardiovascular:  Negative for leg swelling.  ?Gastrointestinal:  Negative for abdominal pain, constipation, nausea and vomiting.  ?Genitourinary:  Positive for frequency. Negative for dysuria and urgency.  ?     The patient stated she pushes her suprapubic region to help emptying her bladder  ?Musculoskeletal:  Positive for arthralgias, back pain and gait problem.  ?     Lower back/Left hip/leg pain. Left lower rib cage pain palpated.   ?Skin:   ?     Left hip surgical incision is covered in clean intact dressing, slightly swelling in the area.   ?Neurological:  Positive for numbness. Negative for seizures and weakness.  ?     Tingling/numbness left foot sometimes. Occasionally left hip/leg pain sometimes. Lightheaded when not sleeping well in lifetime.   ?Psychiatric/Behavioral:  Negative for behavioral problems and sleep disturbance. The patient is not nervous/anxious.   ?     Feels lightheaded if not sleep well at night.  ? ?Immunization History  ?Administered Date(s) Administered  ? Fluad Quad(high Dose 65+) 12/03/2020  ? Influenza Whole 11/19/2017  ? Influenza, High Dose Seasonal PF 11/26/2016, 12/01/2018, 11/30/2019  ? Influenza-Unspecified 11/18/2014, 11/29/2015  ? Moderna Covid-19 Vaccine Bivalent Booster 25yrs & up 11/06/2020  ? Moderna SARS-COV2 Booster Vaccination 12/27/2019, 06/26/2020  ? Moderna Sars-Covid-2 Vaccination 02/21/2019, 03/21/2019  ? Pneumococcal Conjugate-13 09/24/2017  ? Pneumococcal Polysaccharide-23 12/30/2012, 09/18/2018  ? Tdap 08/20/2017  ? Zoster Recombinat (Shingrix) 07/21/2017, 12/08/2017  ? Zoster, Live 05/28/2007  ? ?Pertinent  Health Maintenance Due  ?Topic Date Due  ? MAMMOGRAM  08/13/2017  ? URINE MICROALBUMIN  04/19/2021  ? INFLUENZA VACCINE  09/17/2021  ? HEMOGLOBIN A1C  10/26/2021  ? FOOT EXAM  04/11/2022  ? OPHTHALMOLOGY EXAM  05/07/2022   ? DEXA SCAN  Completed  ? ? ?  05/11/2021  ? 11:00 AM 05/11/2021  ?  8:00 PM 05/12/2021  ?  7:39 AM 05/12/2021  ?  8:00 PM 05/13/2021  ?  8:00 AM  ?Fall Risk  ?Patient Fall Risk Level High fall risk High fall ris

## 2021-05-20 ENCOUNTER — Encounter: Payer: Self-pay | Admitting: Nurse Practitioner

## 2021-05-20 NOTE — Assessment & Plan Note (Signed)
stable, on Golytely daily, Senokot S II bid.  ?

## 2021-05-20 NOTE — Assessment & Plan Note (Signed)
no active seizures since last seen, failed generic Tegretol in the past, on Tegretol 300mg qd, Clonazepam hs (GDR to stop on her own didn't cause active seizures, but flare up insomnia and anxiety). S/p Neurology  ?

## 2021-05-20 NOTE — Assessment & Plan Note (Signed)
takes Reclast, t-score -3.6 09/13/19 

## 2021-05-20 NOTE — Assessment & Plan Note (Signed)
Post op anemia, Hgb 9.4 05/12/21<<10.2 05/16/21, on Iron ?

## 2021-05-20 NOTE — Assessment & Plan Note (Signed)
takes Clonazepam, failed GDR,  failed Ambien. TSH 3.2 12/21/20 

## 2021-05-20 NOTE — Assessment & Plan Note (Signed)
Hgb a1c 5.7 04/25/21 

## 2021-05-20 NOTE — Assessment & Plan Note (Signed)
not taking PPI, Diet controls. ?

## 2021-05-20 NOTE — Assessment & Plan Note (Signed)
takes Atorvastatin 5mg qd, LDL 79 08/15/20 

## 2021-05-20 NOTE — Assessment & Plan Note (Signed)
Hospitalized 05/09/21-05/13/21 for acute nondisplaced intertrochanteric fracture of the left femur sustained from fall from losing her balance while she was holding the door for her neighbor. S/p ORIF 05/09/21 by Dr. August Saucer, DVT prophylaxis ASA 81mg  bid, prn Norco, Methocarbamol for pain. WBAT ?

## 2021-05-20 NOTE — Assessment & Plan Note (Addendum)
05/17/21 the patient and therapy reported the patient's posterior left lower chest wall pain, no spinal spinous process tenderness, no pain reproduced with deep breathing or truncal movement, no noted bruise, had CXR 05/09/21 in hospital w/o acute process. Will obtain X-ray L ribs and T spine.  ?05/17/21 X-ray thoracic spine/L ribs: no fx, ?Prn Norco, Robaxin available to her for pain.  ?

## 2021-05-21 ENCOUNTER — Encounter: Payer: Self-pay | Admitting: Internal Medicine

## 2021-05-21 ENCOUNTER — Non-Acute Institutional Stay (SKILLED_NURSING_FACILITY): Payer: Medicare Other | Admitting: Internal Medicine

## 2021-05-21 DIAGNOSIS — F5101 Primary insomnia: Secondary | ICD-10-CM

## 2021-05-21 DIAGNOSIS — E785 Hyperlipidemia, unspecified: Secondary | ICD-10-CM

## 2021-05-21 DIAGNOSIS — K3189 Other diseases of stomach and duodenum: Secondary | ICD-10-CM | POA: Diagnosis not present

## 2021-05-21 DIAGNOSIS — S72002S Fracture of unspecified part of neck of left femur, sequela: Secondary | ICD-10-CM | POA: Diagnosis not present

## 2021-05-21 DIAGNOSIS — G40822 Epileptic spasms, not intractable, without status epilepticus: Secondary | ICD-10-CM | POA: Diagnosis not present

## 2021-05-21 DIAGNOSIS — K5904 Chronic idiopathic constipation: Secondary | ICD-10-CM

## 2021-05-21 DIAGNOSIS — M810 Age-related osteoporosis without current pathological fracture: Secondary | ICD-10-CM

## 2021-05-21 NOTE — Progress Notes (Signed)
?Provider:  Veleta Miners MD ?Location:   [facility] ?Nursing Home Room Number: [phone] ?Place of Service:  SNF (31) ? ?PCP: Mast, Man X, NP ?Patient Care Team: ?Mast, Man X, NP as PCP - General (Internal Medicine) ?Mast, Man X, NP as Nurse Practitioner (Internal Medicine) ? ?Extended Emergency Contact Information ?Primary Emergency Contact: [name],[name] ? [place] ?Mobile Phone: [phone] ?Relation: Friend ? ?Code Status: Full Code ?Goals of Care: Advanced Directive information ? ?  05/11/2021  ? 11:00 AM  ?Advanced Directives  ?Type of Advance Directive Healthcare Power of Attorney  ? ? ? ? ?Chief Complaint  ?Patient presents with  ? New Admit To SNF  ?  Admission to SNF  ? ? ?HPI: Patient is a 81 y.o. female seen today for admission to SNF  ? ? ?Patient has a history of seizures on Tegretol, constipation, HLD, osteoporosis, insomnia and erosive gastropathy. ?Admitted in the hospital from 03/23 to 03/27 for left hip fracture after a mechanical fall ? ?Patient fell in the facility when she was opening the door for another resident.  X-ray showed acute nondisplaced intertrochanteric fracture of left femur. ?Underwent ORIF on 03/23. ?She is weightbearing as tolerated ?Discharged back to rehab ? ?Her main complaint today was pain.  She is also having some low back pain. ?Constipation relieved by senna ?She is also worried about her Tegretol dose which she wants to make sure it is given at night. ?Patient also having issues with insomnia ? ? ? ? ?Past Medical History:  ?Diagnosis Date  ? Anxiety   ? Cataract   ? Chronic constipation   ? Depression   ? Osteoporosis   ? Seizures ([place])   ? epilepsy  last seizure 1977  ? ?Past Surgical History:  ?Procedure Laterality Date  ? ABDOMINAL HYSTERECTOMY  1995  ? Dr. Edwyna Shell  ? CATARACT EXTRACTION Bilateral 12/2016  ? COLONOSCOPY  2012  ? patchy increased intraepithelial lymphocytes - draelos  ? ESOPHAGOGASTRODUODENOSCOPY    ? multiple  ?  FISSURECTOMY    ? INTRAMEDULLARY (IM) NAIL INTERTROCHANTERIC Left 05/09/2021  ? Procedure: INTRAMEDULLARY (IM) NAIL INTERTROCHANTRIC;  Surgeon: Meredith Pel, MD;  Location: [facility];  Service: Orthopedics;  Laterality: Left;  ? WRIST SURGERY Left   ? due to fracture  ? ? reports that she quit smoking about 40 years ago. Her smoking use included cigarettes. She started smoking about 65 years ago. She smoked an average of 1 pack per day. She has never used smokeless tobacco. She reports that she does not drink alcohol and does not use drugs. ?Social History  ? ?Socioeconomic History  ? Marital status: Widowed  ?  Spouse name: Not on file  ? Number of children: 0  ? Years of education: Not on file  ? Highest education level: Some college, no degree  ?Occupational History  ? Occupation: Retired  ?Tobacco Use  ? Smoking status: Former  ?  Packs/day: 1.00  ?  Types: Cigarettes  ?  Start date: 02/18/1956  ?  Quit date: 02/17/1981  ?  Years since quitting: 40.2  ? Smokeless tobacco: Never  ?Vaping Use  ? Vaping Use: Never used  ?Substance and Sexual Activity  ? Alcohol use: No  ?  Alcohol/week: 0.0 standard drinks  ? Drug use: No  ? Sexual activity: Not on file  ?Other Topics Concern  ? Not on file  ?Social History Narrative  ? Social History  ?   Marital status: Widowed  Spouse name:                     ?   Years of education:  1 year college              Number of children:0          ? Widowed no children  ?   ? Occupational History: Stage manager, [employer] manager care, [employer].)  ?   None on file  ?   ? Social History Main Topics  ?   Smoking status: Former Smoker                                       ?      Packs/day: 1.00      Years: 0.00      ?      Types: Cigarettes  ?      Smokeless tobacco: Not on file                     ?   Alcohol use: No            ?   Drug use: No            ?   Sexual activity: Not on file        ?   ? Does not drink caffeine, but does eat  chocolate.  ? Does live in an apartment ([address]) retirement community does exercise : walks daily  ? Right handed  ?   ?   ? ?Social Determinants of Health  ? ?Financial Resource Strain: Not on file  ?Food Insecurity: Not on file  ?Transportation Needs: Not on file  ?Physical Activity: Not on file  ?Stress: Not on file  ?Social Connections: Not on file  ?Intimate Partner Violence: Not on file  ? ? ?Functional Status Survey: ?  ? ?Family History  ?Problem Relation Age of Onset  ? Alcohol abuse Father   ? Diabetes Father   ? Diabetes Maternal Grandmother   ? Diabetes Paternal Grandmother   ? Colon cancer Maternal Grandfather   ? Esophageal cancer Neg Hx   ? Rectal cancer Neg Hx   ? Stomach cancer Neg Hx   ? ? ?Health Maintenance  ?Topic Date Due  ? MAMMOGRAM  08/13/2017  ? COVID-19 Vaccine (3 - Moderna risk series) 11/06/2020  ? URINE MICROALBUMIN  04/19/2021  ? INFLUENZA VACCINE  09/17/2021  ? HEMOGLOBIN A1C  10/26/2021  ? FOOT EXAM  04/11/2022  ? OPHTHALMOLOGY EXAM  05/07/2022  ? TETANUS/TDAP  08/21/2027  ? Pneumonia Vaccine 42+ Years old  Completed  ? DEXA SCAN  Completed  ? Zoster Vaccines- Shingrix  Completed  ? HPV VACCINES  Aged Out  ? ? ?Allergies  ?Allergen Reactions  ? Cat Hair Extract Other (See Comments)  ?  Patient states that it causes her eyes to swell shut  ? Cefaclor Other (See Comments)  ?  unknown  ? Codeine Nausea Only  ? Fish Allergy Nausea Only  ? Iodinated Contrast Media Other (See Comments)  ?  Shrimp caused stomach pain and nausea ?Per patient can eat fried oysters okay ? ?  ? No Healthtouch Food Allergies   ?  Tomatoes, Red, Seeds causes Reflux ?  ? Other Nausea Only  ?  Unknown allergy ?Pain medications, Pt states she  does not know exactly which ones. Pt typically takes Tylenol for  pain.  ? Oxycodone-Acetaminophen Nausea And Vomiting  ? Penicillins Itching  ? ? ?Allergies as of 05/21/2021   ? ?   Reactions  ? Cat Hair Extract Other (See Comments)  ? Patient states that it causes her eyes to  swell shut  ? Cefaclor Other (See Comments)  ? unknown  ? Codeine Nausea Only  ? Fish Allergy Nausea Only  ? Iodinated Contrast Media Other (See Comments)  ? Shrimp caused stomach pain and nausea ?Per patient can eat fried oysters okay  ? No Healthtouch Food Allergies   ? Tomatoes, Red, Seeds causes Reflux  ? Other Nausea Only  ? Unknown allergy ?Pain medications, Pt states she does not know exactly which ones. Pt typically takes Tylenol for  pain.  ? Oxycodone-acetaminophen Nausea And Vomiting  ? Penicillins Itching  ? ?  ? ?  ?Medication List  ?  ? ?  ? Accurate as of May 21, 2021 10:04 AM. If you have any questions, ask your nurse or doctor.  ?  ?  ? ?  ? ?acetaminophen 500 MG tablet ?Commonly known as: TYLENOL ?Take 1,000 mg by mouth every 6 (six) hours as needed for mild pain. ?  ?aspirin 81 MG EC tablet ?Take 1 tablet (81 mg total) by mouth 2 (two) times daily. Swallow whole. ?  ?atorvastatin 10 MG tablet ?Commonly known as: LIPITOR ?TAKE (1/2) TABLET DAILY. ?  ?CALTRATE 600+D PO ?Take 1 tablet by mouth 2 (two) times daily. Chewable 11 am and 8:30 pm ?  ?clonazePAM 1 MG tablet ?Commonly known as: KLONOPIN ?TAKE ONE TABLET BY MOUTH AT BEDTIME ?  ?Ensure Active High Protein Liqd ?Take 237 mLs by mouth daily as needed (supplement). ?  ?Golytely 236 g solution ?Generic drug: polyethylene glycol ?MIX WITH WATER AND DRINK 3-3 1/2 OUNCES DAILY AS DIRECTED. ?  ?HYDROcodone-acetaminophen 5-325 MG tablet ?Commonly known as: NORCO/VICODIN ?Take 1 tablet by mouth every 4 (four) hours as needed for moderate pain. ?  ?Iron (Ferrous Sulfate) 325 (65 Fe) MG Tabs ?Take by mouth. Once A Day on Mon, Wed, Fri ?  ?methocarbamol 500 MG tablet ?Commonly known as: ROBAXIN ?Take 500 mg by mouth every 6 (six) hours as needed for muscle spasms. ?  ?PRESERVISION AREDS PO ?Take 1 tablet by mouth 2 (two) times daily. ?  ?senna-docusate 8.6-50 MG tablet ?Commonly known as: Senokot-S ?Take 2 tablets by mouth 2 (two) times daily. ?  ?Soothe  XP Soln ?Place 1 drop into both eyes in the morning, at noon, in the evening, and at bedtime. ?  ?TEGretol 200 MG tablet ?Generic drug: carbamazepine ?TAKE 1&1/2 TABLETS ONCE DAILY. ?  ?Vitamin D (Ergocalciferol) 1.25 MG

## 2021-05-28 ENCOUNTER — Non-Acute Institutional Stay (SKILLED_NURSING_FACILITY): Payer: Medicare Other | Admitting: Internal Medicine

## 2021-05-28 ENCOUNTER — Encounter: Payer: Self-pay | Admitting: Internal Medicine

## 2021-05-28 DIAGNOSIS — K5904 Chronic idiopathic constipation: Secondary | ICD-10-CM | POA: Diagnosis not present

## 2021-05-28 DIAGNOSIS — R110 Nausea: Secondary | ICD-10-CM | POA: Diagnosis not present

## 2021-05-28 DIAGNOSIS — E785 Hyperlipidemia, unspecified: Secondary | ICD-10-CM | POA: Diagnosis not present

## 2021-05-28 DIAGNOSIS — F5101 Primary insomnia: Secondary | ICD-10-CM | POA: Diagnosis not present

## 2021-05-28 DIAGNOSIS — S72002S Fracture of unspecified part of neck of left femur, sequela: Secondary | ICD-10-CM

## 2021-05-28 DIAGNOSIS — G40822 Epileptic spasms, not intractable, without status epilepticus: Secondary | ICD-10-CM

## 2021-05-28 DIAGNOSIS — M810 Age-related osteoporosis without current pathological fracture: Secondary | ICD-10-CM

## 2021-05-28 MED ORDER — CLONAZEPAM 0.5 MG PO TABS: 0.5000 mg | ORAL_TABLET | Freq: Every day | ORAL | 0 refills | Status: DC | PRN

## 2021-05-28 NOTE — Progress Notes (Signed)
?Location:   [facility] ?Nursing Home Room Number: 41 ?Place of Service:  SNF (31) ?Provider:  Einar CrowGupta, Ahad Colarusso MD ? ?Mast, Man X, NP ? ?Patient Care Team: ?Mast, Man X, NP as40 PCP - General (Internal Medicine) ?Mast, Man X, NP as Nurse Practitioner (Internal Medicine) ? ?Extended Emergency Contact Information ?Primary Emergency Contact: [name],[name] ? [place] ?Mobile Phone: [phone] ?Relation: Friend ? ?Code Status:  Full Code ?Goals of care: Advanced Directive information ? ?  05/28/2021  ?  2:38 PM  ?Advanced Directives  ?Does Patient Have a Medical Advance Directive? Yes  ?Type of Advance Directive Healthcare Power of Attorney  ?Does patient want to make changes to medical advance directive? No - Patient declined  ?Copy of Healthcare Power of Attorney in Chart? Yes - validated most recent copy scanned in chart (See row information)  ? ? ? ?Chief Complaint  ?Patient presents with  ? Acute Visit  ? ? ?HPI:  ?Pt is a 81 y.o. female seen today for an acute visit for Nausea and Insomnia ? ?Patient has a history of seizures on Tegretol, constipation, HLD, osteoporosis, insomnia and erosive gastropathy. ?Admitted in the hospital from 03/23 to 03/27 for left hip fracture after a mechanical fall ?ORIF on 03/23 ? ?C/o Nausea all day  ?No Vomiting  ?Poor appetite ?Mild epigastric discomfort ?Also continue to have low back pain/discomfort ?Still not sleeping well ?Says she get dizzy as she is not sleeping her head feels different ? ?Pain controlled with Norco ?Walking now without supervision ?Able to get up by herself ? ? ?Past Medical History:  ?Diagnosis Date  ? Anxiety   ? Cataract   ? Chronic constipation   ? Depression   ? Osteoporosis   ? Seizures ([name])   ? epilepsy  last seizure 1977  ? ?Past Surgical History:  ?Procedure Laterality Date  ? ABDOMINAL HYSTERECTOMY  1995  ? Dr. Carlis AbbottVan Fletcher  ? CATARACT EXTRACTION Bilateral 12/2016  ? COLONOSCOPY  2012  ? patchy increased intraepithelial  lymphocytes - draelos  ? ESOPHAGOGASTRODUODENOSCOPY    ? multiple  ? FISSURECTOMY    ? INTRAMEDULLARY (IM) NAIL INTERTROCHANTERIC Left 05/09/2021  ? Procedure: INTRAMEDULLARY (IM) NAIL INTERTROCHANTRIC;  Surgeon: Cammy Copaean, Gregory Scott, MD;  [facility];  Service: Orthopedics;  Laterality: Left;  ? WRIST SURGERY Left   ? due to fracture  ? ? ?Allergies  ?Allergen Reactions  ? Cat Hair Extract Other (See Comments)  ?  Patient states that it causes her eyes to swell shut  ? Cefaclor Other (See Comments)  ?  unknown  ? Codeine Nausea Only  ? Fish Allergy Nausea Only  ? Iodinated Contrast Media Other (See Comments)  ?  Shrimp caused stomach pain and nausea ?Per patient can eat fried oysters okay ? ?  ? No Healthtouch Food Allergies   ?  Tomatoes, Red, Seeds causes Reflux ?  ? Other Nausea Only  ?  Unknown allergy ?Pain medications, Pt states she does not know exactly which ones. Pt typically takes Tylenol for  pain.  ? Oxycodone-Acetaminophen Nausea And Vomiting  ? Penicillins Itching  ? ? ?Allergies as of 05/28/2021   ? ?   Reactions  ? Cat Hair Extract Other (See Comments)  ? Patient states that it causes her eyes to swell shut  ? Cefaclor Other (See Comments)  ? unknown  ? Codeine Nausea Only  ? Fish Allergy Nausea Only  ? Iodinated Contrast Media Other (See Comments)  ? Shrimp  caused stomach pain and nausea ?Per patient can eat fried oysters okay  ? No Healthtouch Food Allergies   ? Tomatoes, Red, Seeds causes Reflux  ? Other Nausea Only  ? Unknown allergy ?Pain medications, Pt states she does not know exactly which ones. Pt typically takes Tylenol for  pain.  ? Oxycodone-acetaminophen Nausea And Vomiting  ? Penicillins Itching  ? ?  ? ?  ?Medication List  ?  ? ?  ? Accurate as of May 28, 2021  2:40 PM. If you have any questions, ask your nurse or doctor.  ?  ?  ? ?  ? ?acetaminophen 500 MG tablet ?Commonly known as: TYLENOL ?Take 1,000 mg by mouth every 6 (six) hours as needed for mild pain. ?  ?aspirin 81 MG EC  tablet ?Take 1 tablet (81 mg total) by mouth 2 (two) times daily. Swallow whole. ?  ?atorvastatin 10 MG tablet ?Commonly known as: LIPITOR ?TAKE (1/2) TABLET DAILY. ?  ?CALTRATE 600+D PO ?Take 1 tablet by mouth 2 (two) times daily. Chewable 11 am and 8:30 pm ?  ?clonazePAM 1 MG tablet ?Commonly known as: KLONOPIN ?TAKE ONE TABLET BY MOUTH AT BEDTIME ?  ?Ensure Active High Protein Liqd ?Take 237 mLs by mouth daily as needed (supplement). ?  ?Golytely 236 g solution ?Generic drug: polyethylene glycol ?MIX WITH WATER AND DRINK 3-3 1/2 OUNCES DAILY AS DIRECTED. ?  ?HYDROcodone-acetaminophen 5-325 MG tablet ?Commonly known as: NORCO/VICODIN ?Take 1 tablet by mouth every 4 (four) hours as needed for moderate pain. ?  ?Iron (Ferrous Sulfate) 325 (65 Fe) MG Tabs ?Take by mouth. Once A Day on Mon, Wed, Fri ?  ?methocarbamol 500 MG tablet ?Commonly known as: ROBAXIN ?Take 500 mg by mouth every 6 (six) hours as needed for muscle spasms. ?  ?PRESERVISION AREDS PO ?Take 1 tablet by mouth 2 (two) times daily. ?  ?senna-docusate 8.6-50 MG tablet ?Commonly known as: Senokot-S ?Take 2 tablets by mouth 2 (two) times daily. ?  ?Soothe XP Soln ?Place 1 drop into both eyes in the morning, at noon, in the evening, and at bedtime. ?  ?TEGretol 200 MG tablet ?Generic drug: carbamazepine ?TAKE 1&1/2 TABLETS ONCE DAILY. ?  ?Vitamin D (Ergocalciferol) 1.25 MG (50000 UNIT) Caps capsule ?Commonly known as: DRISDOL ?TAKE 1 CAPSULE WEEKLY. ?  ? ?  ? ? ?Review of Systems  ?Constitutional:  Positive for appetite change. Negative for activity change.  ?HENT: Negative.    ?Respiratory:  Negative for cough and shortness of breath.   ?Cardiovascular:  Negative for leg swelling.  ?Gastrointestinal:  Positive for nausea. Negative for constipation.  ?Genitourinary: Negative.   ?Musculoskeletal:  Positive for arthralgias, gait problem and myalgias.  ?Skin: Negative.   ?Neurological:  Negative for dizziness and weakness.  ?Psychiatric/Behavioral:  Positive  for sleep disturbance. Negative for confusion and dysphoric mood.   ? ?Immunization History  ?Administered Date(s) Administered  ? Fluad Quad(high Dose 65+) 12/03/2020  ? Influenza Whole 11/19/2017  ? Influenza, High Dose Seasonal PF 11/26/2016, 12/01/2018, 11/30/2019  ? Influenza-Unspecified 11/18/2014, 11/29/2015  ? Moderna Covid-19 Vaccine Bivalent Booster 26yrs & up 11/06/2020  ? Moderna SARS-COV2 Booster Vaccination 12/27/2019, 06/26/2020  ? Moderna Sars-Covid-2 Vaccination 02/21/2019, 03/21/2019  ? Pneumococcal Conjugate-13 09/24/2017  ? Pneumococcal Polysaccharide-23 12/30/2012, 09/18/2018  ? Tdap 08/20/2017  ? Zoster Recombinat (Shingrix) 07/21/2017, 12/08/2017  ? Zoster, Live 05/28/2007  ? ?Pertinent  Health Maintenance Due  ?Topic Date Due  ? MAMMOGRAM  08/13/2017  ? URINE MICROALBUMIN  04/19/2021  ?  INFLUENZA VACCINE  09/17/2021  ? HEMOGLOBIN A1C  10/26/2021  ? FOOT EXAM  04/11/2022  ? OPHTHALMOLOGY EXAM  05/07/2022  ? DEXA SCAN  Completed  ? ? ?  05/11/2021  ? 11:00 AM 05/11/2021  ?  8:00 PM 05/12/2021  ?  7:39 AM 05/12/2021  ?  8:00 PM 05/13/2021  ?  8:00 AM  ?Fall Risk  ?Patient Fall Risk Level High fall risk High fall risk High fall risk High fall risk High fall risk  ? ?Functional Status Survey: ?  ? ?Vitals:  ? 05/28/21 1421  ?BP: 117/73  ?Pulse: 60  ?Resp: 18  ?Temp: 98 ?F (36.7 ?C)  ?SpO2: 96%  ?Weight: 103 lb 4.8 oz (46.9 kg)  ?Height: 5\' 7"  (1.702 m)  ? ?Body mass index is 16.18 kg/m? ?Physical Exam ?Vitals reviewed.  ?Constitutional:   ?   Appearance: Normal appearance.  ?HENT:  ?   Head: Normocephalic.  ?   Nose: Nose normal.  ?   Mouth/Throat:  ?   Mouth: Mucous membranes are moist.  ?   Pharynx: Oropharynx is clear.  ?Eyes:  ?   Pupils: Pupils are equal, round, and reactive to light.  ?Cardiovascular:  ?   Rate and Rhythm: Normal rate and regular rhythm.  ?   Pulses: Normal pulses.  ?   Heart sounds: Normal heart sounds. No murmur heard. ?Pulmonary:  ?   Effort: Pulmonary effort is normal.  ?    Breath sounds: Normal breath sounds.  ?Abdominal:  ?   General: Abdomen is flat. Bowel sounds are normal.  ?   Palpations: Abdomen is soft.  ?Musculoskeletal:     ?   General: No swelling.  ?   Cervical ba

## 2021-05-29 ENCOUNTER — Ambulatory Visit (INDEPENDENT_AMBULATORY_CARE_PROVIDER_SITE_OTHER): Payer: Medicare Other

## 2021-05-29 ENCOUNTER — Ambulatory Visit (INDEPENDENT_AMBULATORY_CARE_PROVIDER_SITE_OTHER): Payer: Medicare Other | Admitting: Orthopedic Surgery

## 2021-05-29 ENCOUNTER — Encounter: Payer: Self-pay | Admitting: Orthopedic Surgery

## 2021-05-29 DIAGNOSIS — G8918 Other acute postprocedural pain: Secondary | ICD-10-CM

## 2021-05-29 NOTE — Progress Notes (Signed)
? ?Post-Op Visit Note ?  ?Patient: Erin Ochoa           ?Date of Birth: 1940/06/14           ?MRN: [id number] ?Visit Date: 05/29/2021 ?PCP: Mast, Man X, NP ? ? ?Assessment & Plan: ? ?Chief Complaint:  ?Chief Complaint  ?Patient presents with  ? Left Leg - Routine Post Op  ?  05/09/21 left leg IM nail  ? ?Visit Diagnoses:  ?1. Post-op pain   ? ? ?Plan: Patient is an 81 year old female who is now about 3 weeks out left intramedullary hip screw for fracture.  She is walking full weightbearing with a walker.  Generally speaking her hip is doing well.  On exam she has excellent hip flexion abduction adduction strength.  No groin pain with internal/external Tatian of the leg.  Radiographs look good in terms of fracture consolidation and hardware placement.  Plan at this time is to continue weightbearing as tolerated and follow-up as needed.  No calf tenderness negative Homans today. ? ?Follow-Up Instructions: Return if symptoms worsen or fail to improve.  ? ?Orders:  ?Orders Placed This Encounter  ?Procedures  ? XR FEMUR MIN 2 VIEWS LEFT  ? ?No orders of the defined types were placed in this encounter. ? ? ?Imaging: ?XR FEMUR MIN 2 VIEWS LEFT ? ?Result Date: 05/29/2021 ?AP lateral radiographs left femur reviewed.  Intramedullary hip screw present with good positioning in the femoral head and neck.  No hardware complications.  Some fracture healing is present in the intertrochanteric region.  No screw cut out.  ? ?PMFS History: ?Patient Active Problem List  ? Diagnosis Date Noted  ? Blood loss anemia 05/14/2021  ? Acute postoperative pain of left hip 05/09/2021  ? Thyroid nodule 05/09/2021  ? Left-sided chest wall pain 05/09/2021  ? Macular degeneration 04/11/2021  ? Dysuria 11/20/2020  ? Dry skin dermatitis 11/20/2020  ? Hyperlipidemia 03/10/2019  ? Weight loss 07/29/2018  ? Abnormal CT scan, pelvis 06/17/2018  ? Erosive gastropathy 03/25/2018  ? Renal cyst, right 01/13/2018  ? Liver cyst 01/13/2018  ? Anxiety 06/04/2017   ? High risk medications (not anticoagulants) long-term use 06/04/2017  ? Pre-diabetes 02/03/2017  ? Cataract 10/30/2016  ? H/O colonoscopy 10/30/2016  ? Osteoporosis 10/30/2016  ? Constipation 10/02/2016  ? Rosacea 01/24/2016  ? Gastroesophageal reflux disease without esophagitis 01/24/2016  ? Epilepsy without status epilepticus, not intractable ([name]) 08/24/2014  ? Insomnia 08/24/2014  ? ?Past Medical History:  ?Diagnosis Date  ? Anxiety   ? Cataract   ? Chronic constipation   ? Depression   ? Osteoporosis   ? Seizures ([name])   ? epilepsy  last seizure 1977  ?  ?Family History  ?Problem Relation Age of Onset  ? Alcohol abuse Father   ? Diabetes Father   ? Diabetes Maternal Grandmother   ? Diabetes Paternal Grandmother   ? Colon cancer Maternal Grandfather   ? Esophageal cancer Neg Hx   ? Rectal cancer Neg Hx   ? Stomach cancer Neg Hx   ?  ?Past Surgical History:  ?Procedure Laterality Date  ? ABDOMINAL HYSTERECTOMY  1995  ? Dr. Carlis Abbott  ? CATARACT EXTRACTION Bilateral 12/2016  ? COLONOSCOPY  2012  ? patchy increased intraepithelial lymphocytes - draelos  ? ESOPHAGOGASTRODUODENOSCOPY    ? multiple  ? FISSURECTOMY    ? INTRAMEDULLARY (IM) NAIL INTERTROCHANTERIC Left 05/09/2021  ? Procedure: INTRAMEDULLARY (IM) NAIL INTERTROCHANTRIC;  Surgeon: Cammy Copa, MD;  Location: [facility]  OR;  Service: Orthopedics;  Laterality: Left;  ? WRIST SURGERY Left   ? due to fracture  ? ?Social History  ? ?Occupational History  ? Occupation: Retired  ?Tobacco Use  ? Smoking status: Former  ?  Packs/day: 1.00  ?  Types: Cigarettes  ?  Start date: 02/18/1956  ?  Quit date: 02/17/1981  ?  Years since quitting: 40.3  ? Smokeless tobacco: Never  ?Vaping Use  ? Vaping Use: Never used  ?Substance and Sexual Activity  ? Alcohol use: No  ?  Alcohol/week: 0.0 standard drinks  ? Drug use: No  ? Sexual activity: Not on file  ? ? ? ?

## 2021-06-07 ENCOUNTER — Encounter: Payer: Self-pay | Admitting: Nurse Practitioner

## 2021-06-07 ENCOUNTER — Non-Acute Institutional Stay (SKILLED_NURSING_FACILITY): Payer: Medicare Other | Admitting: Nurse Practitioner

## 2021-06-07 DIAGNOSIS — K5904 Chronic idiopathic constipation: Secondary | ICD-10-CM

## 2021-06-07 DIAGNOSIS — F5101 Primary insomnia: Secondary | ICD-10-CM | POA: Diagnosis not present

## 2021-06-07 DIAGNOSIS — E785 Hyperlipidemia, unspecified: Secondary | ICD-10-CM | POA: Diagnosis not present

## 2021-06-07 DIAGNOSIS — G40822 Epileptic spasms, not intractable, without status epilepticus: Secondary | ICD-10-CM

## 2021-06-07 DIAGNOSIS — D500 Iron deficiency anemia secondary to blood loss (chronic): Secondary | ICD-10-CM | POA: Diagnosis not present

## 2021-06-07 DIAGNOSIS — M810 Age-related osteoporosis without current pathological fracture: Secondary | ICD-10-CM

## 2021-06-07 DIAGNOSIS — M25552 Pain in left hip: Secondary | ICD-10-CM

## 2021-06-07 DIAGNOSIS — R7303 Prediabetes: Secondary | ICD-10-CM | POA: Diagnosis not present

## 2021-06-07 DIAGNOSIS — K3189 Other diseases of stomach and duodenum: Secondary | ICD-10-CM | POA: Diagnosis not present

## 2021-06-07 NOTE — Progress Notes (Signed)
?Location:  [facility] ?Nursing Home Room Number: N/A ?Place of Service:  SNF (31) ?Provider:  Alissia Lory X, NP ? ?Uchenna Rappaport X, NP ? ?Patient Care Team: ?Atlantis Delong X, NP as PCP - General (Internal Medicine) ?Seraphim Trow X, NP as Nurse Practitioner (Internal Medicine) ? ?Extended Emergency Contact Information ?Primary Emergency Contact: [name],[name] ? [place] ?Mobile Phone: [phone] ?Relation: Friend ? ?Code Status:  FULL ?Goals of care: Advanced Directive information ? ?  06/07/2021  ?  8:36 AM  ?Advanced Directives  ?Does Patient Have a Medical Advance Directive? Yes  ?Type of Advance Directive Healthcare Power of Attorney  ?Does patient want to make changes to medical advance directive? No - Patient declined  ?Copy of Healthcare Power of Attorney in Chart? Yes - validated most recent copy scanned in chart (See row information)  ? ? ? ?Chief Complaint  ?Patient presents with  ? Discharge Note  ?  Patient is discharging from the facility  ? ? ?HPI:  ?Pt is a 81 y.o. female with PMH of seizure, constipation, hyperlipidemia, Erosive gastropathy, and OP [facility] following hospitalization 05/09/21-05/13/21 for acute nondisplaced intertrochanteric fracture of the left femur sustained from fall. S/p ORIF 05/09/21 by Dr. August Saucerean, DVT prophylaxis ASA, prn Norco, Methocarbamol for pain. ? The patient has regained her physical strength, ADL function, [facility], [facility].  ?            Post op anemia, Hgb 9.4 05/12/21<<10.2 05/16/21, off Iron 2/2 nausea.  ?            Seizures, no active seizures since last seen, failed generic Tegretol in the past, on Tegretol 300mg  qd, Clonazepam hs (GDR to stop on her own didn't cause active seizures, but flare up insomnia and anxiety). S/p Neurology  ?            Constipation, stable, on MiraLax, Senokot S II bid.  ?            Hyperlipidemia, takes  Atorvastatin 5mg  qd, LDL 79 08/15/20 ?            Prediabetic, Hgb a1c 5.7 04/25/21 ?            OP takes Reclast, t-score -3.6 09/13/19 ?            Insomnia, takes Clonazepam, failed GDR,  failed Ambien. TSH 3.2 12/21/20 ?            Erosive gastropathy, s/p 2 wks of PPI, Diet controls. ?  ? ?Past Medical History:  ?Diagnosis Date  ? Anxiety   ? Cataract   ? Chronic constipation   ? Depression   ? Osteoporosis   ? Seizures ([name])   ? epilepsy  last seizure 1977  ? ?Past Surgical History:  ?Procedure Laterality Date  ? ABDOMINAL HYSTERECTOMY  1995  ? Dr. Carlis AbbottVan Fletcher  ? CATARACT EXTRACTION Bilateral 12/2016  ? COLONOSCOPY  2012  ? patchy increased intraepithelial lymphocytes - draelos  ? ESOPHAGOGASTRODUODENOSCOPY    ? multiple  ? FISSURECTOMY    ? INTRAMEDULLARY (IM) NAIL INTERTROCHANTERIC Left 05/09/2021  ? Procedure: INTRAMEDULLARY (IM) NAIL INTERTROCHANTRIC;  Surgeon: Cammy Copaean, Gregory Scott, MD;  [facility];  Service: Orthopedics;  Laterality: Left;  ? WRIST SURGERY Left   ? due to fracture  ? ? ?Allergies  ?Allergen Reactions  ? Cat  Hair Extract Other (See Comments)  ?  Patient states that it causes her eyes to swell shut  ? Cefaclor Other (See Comments)  ?  unknown  ? Codeine Nausea Only  ? Fish Allergy Nausea Only  ? Iodinated Contrast Media Other (See Comments)  ?  Shrimp caused stomach pain and nausea ?Per patient can eat fried oysters okay ? ?  ? No Healthtouch Food Allergies   ?  Tomatoes, Red, Seeds causes Reflux ?  ? Other Nausea Only  ?  Unknown allergy ?Pain medications, Pt states she does not know exactly which ones. Pt typically takes Tylenol for  pain.  ? Oxycodone-Acetaminophen Nausea And Vomiting  ? Penicillins Itching  ? ? ?Outpatient Encounter Medications as of 06/07/2021  ?Medication Sig  ? acetaminophen (TYLENOL) 500 MG tablet Take 1,000 mg by mouth every 6 (six) hours as needed for mild pain.  ? Artificial Tear Solution (SOOTHE XP) SOLN Place 1 drop into both eyes in the morning, at noon, in the  evening, and at bedtime.  ? aspirin EC 81 MG EC tablet Take 1 tablet (81 mg total) by mouth 2 (two) times daily. Swallow whole.  ? atorvastatin (LIPITOR) 10 MG tablet TAKE (1/2) TABLET DAILY.  ? Calcium Carbonate-Vitamin D (CALTRATE 600+D PO) Take 1 tablet by mouth 2 (two) times daily. Chewable 11 am and 8:30 pm  ? clonazePAM (KLONOPIN) 0.5 MG tablet Take 1 tablet (0.5 mg total) by mouth daily as needed for up to 14 days for anxiety.  ? clonazePAM (KLONOPIN) 1 MG tablet TAKE ONE TABLET BY MOUTH AT BEDTIME  ? GOLYTELY 236 g solution MIX WITH WATER AND DRINK 3-3 1/2 OUNCES DAILY AS DIRECTED.  ? Multiple Vitamins-Minerals (PRESERVISION AREDS PO) Take 1 tablet by mouth 2 (two) times daily.  ? Nutritional Supplements (ENSURE ACTIVE HIGH PROTEIN) LIQD Take 237 mLs by mouth daily as needed (supplement).  ? senna-docusate (SENOKOT-S) 8.6-50 MG tablet Take 2 tablets by mouth 2 (two) times daily.  ? TEGRETOL 200 MG tablet TAKE 1&1/2 TABLETS ONCE DAILY.  ? Vitamin D, Ergocalciferol, (DRISDOL) 1.25 MG (50000 UNIT) CAPS capsule TAKE 1 CAPSULE WEEKLY.  ? ?No facility-administered encounter medications on file as of 06/07/2021.  ? ? ?Review of Systems  ?Constitutional:  Negative for appetite change, fatigue and fever.  ?HENT:  Positive for hearing loss. Negative for congestion and voice change.   ?Eyes:  Negative for visual disturbance.  ?Respiratory:  Negative for shortness of breath.   ?Cardiovascular:  Negative for leg swelling.  ?Gastrointestinal:  Negative for abdominal pain and constipation.  ?Genitourinary:  Positive for frequency. Negative for dysuria and urgency.  ?     The patient stated she pushes her suprapubic region to help emptying her bladder  ?Musculoskeletal:  Positive for arthralgias, back pain and gait problem.  ?     Left hip pain when walking, better.   ?Skin:  Negative for color change.  ?     Left hip surgical incision is healed.   ?Neurological:  Positive for numbness. Negative for seizures and weakness.  ?      Tingling/numbness left foot sometimes. Occasionally left hip/leg pain sometimes. Lightheaded when not sleeping well in lifetime.   ?Psychiatric/Behavioral:  Positive for sleep disturbance. Negative for behavioral problems. The patient is not nervous/anxious.   ?     Feels lightheaded if not sleep well at night.  ? ?Immunization History  ?Administered Date(s) Administered  ? Fluad Quad(high Dose 65+) 12/03/2020  ? Influenza Whole 11/19/2017  ?  Influenza, High Dose Seasonal PF 11/26/2016, 12/01/2018, 11/30/2019  ? Influenza-Unspecified 11/18/2014, 11/29/2015  ? Moderna Covid-19 Vaccine Bivalent Booster 83yrs & up 11/06/2020  ? Moderna SARS-COV2 Booster Vaccination 12/27/2019, 06/26/2020  ? Moderna Sars-Covid-2 Vaccination 02/21/2019, 03/21/2019  ? Pneumococcal Conjugate-13 09/24/2017  ? Pneumococcal Polysaccharide-23 12/30/2012, 09/18/2018  ? Tdap 08/20/2017  ? Zoster Recombinat (Shingrix) 07/21/2017, 12/08/2017  ? Zoster, Live 05/28/2007  ? ?Pertinent  Health Maintenance Due  ?Topic Date Due  ? MAMMOGRAM  08/13/2017  ? URINE MICROALBUMIN  04/19/2021  ? INFLUENZA VACCINE  09/17/2021  ? HEMOGLOBIN A1C  10/26/2021  ? FOOT EXAM  04/11/2022  ? OPHTHALMOLOGY EXAM  05/07/2022  ? DEXA SCAN  Completed  ? ? ?  05/11/2021  ? 11:00 AM 05/11/2021  ?  8:00 PM 05/12/2021  ?  7:39 AM 05/12/2021  ?  8:00 PM 05/13/2021  ?  8:00 AM  ?Fall Risk  ?Patient Fall Risk Level High fall risk High fall risk High fall risk High fall risk High fall risk  ? ?Functional Status Survey: ?  ? ?Vitals:  ? 06/07/21 0837  ?BP: 120/60  ?Pulse: 76  ?Resp: 16  ?Temp: (!) 97.5 ?F (36.4 ?C)  ?Weight: 105 lb 6.4 oz (47.8 kg)  ? ?Body mass index is 16.51 kg/m?Marland Kitchen ?Physical Exam ?Vitals and nursing note reviewed.  ?Constitutional:   ?   Appearance: Normal appearance.  ?HENT:  ?   Head: Normocephalic and atraumatic.  ?   Mouth/Throat:  ?   Mouth: Mucous membranes are moist.  ?Eyes:  ?   Extraocular Movements: Extraocular movements intact.  ?   Conjunctiva/sclera:  Conjunctivae normal.  ?   Pupils: Pupils are equal, round, and reactive to light.  ?Cardiovascular:  ?   Rate and Rhythm: Normal rate and regular rhythm.  ?   Heart sounds: No murmur heard. ?Pulmonary:

## 2021-06-10 DIAGNOSIS — R29898 Other symptoms and signs involving the musculoskeletal system: Secondary | ICD-10-CM | POA: Diagnosis not present

## 2021-06-10 DIAGNOSIS — R2681 Unsteadiness on feet: Secondary | ICD-10-CM | POA: Diagnosis not present

## 2021-06-10 DIAGNOSIS — M6281 Muscle weakness (generalized): Secondary | ICD-10-CM | POA: Diagnosis not present

## 2021-06-10 DIAGNOSIS — Z9181 History of falling: Secondary | ICD-10-CM | POA: Diagnosis not present

## 2021-06-10 DIAGNOSIS — M810 Age-related osteoporosis without current pathological fracture: Secondary | ICD-10-CM | POA: Diagnosis not present

## 2021-06-10 DIAGNOSIS — S72145D Nondisplaced intertrochanteric fracture of left femur, subsequent encounter for closed fracture with routine healing: Secondary | ICD-10-CM | POA: Diagnosis not present

## 2021-06-11 ENCOUNTER — Encounter: Payer: Self-pay | Admitting: Nurse Practitioner

## 2021-06-11 DIAGNOSIS — M810 Age-related osteoporosis without current pathological fracture: Secondary | ICD-10-CM | POA: Diagnosis not present

## 2021-06-11 DIAGNOSIS — R419 Unspecified symptoms and signs involving cognitive functions and awareness: Secondary | ICD-10-CM | POA: Diagnosis not present

## 2021-06-11 DIAGNOSIS — R4189 Other symptoms and signs involving cognitive functions and awareness: Secondary | ICD-10-CM | POA: Diagnosis not present

## 2021-06-11 DIAGNOSIS — Z9181 History of falling: Secondary | ICD-10-CM | POA: Diagnosis not present

## 2021-06-11 DIAGNOSIS — S72145D Nondisplaced intertrochanteric fracture of left femur, subsequent encounter for closed fracture with routine healing: Secondary | ICD-10-CM | POA: Diagnosis not present

## 2021-06-11 DIAGNOSIS — M6281 Muscle weakness (generalized): Secondary | ICD-10-CM | POA: Diagnosis not present

## 2021-06-11 DIAGNOSIS — R29898 Other symptoms and signs involving the musculoskeletal system: Secondary | ICD-10-CM | POA: Diagnosis not present

## 2021-06-11 DIAGNOSIS — R2689 Other abnormalities of gait and mobility: Secondary | ICD-10-CM | POA: Diagnosis not present

## 2021-06-11 DIAGNOSIS — R41841 Cognitive communication deficit: Secondary | ICD-10-CM | POA: Diagnosis not present

## 2021-06-11 DIAGNOSIS — R2681 Unsteadiness on feet: Secondary | ICD-10-CM | POA: Diagnosis not present

## 2021-06-11 NOTE — Assessment & Plan Note (Signed)
takes Clonazepam, failed GDR,  failed Ambien. TSH 3.2 12/21/20 

## 2021-06-11 NOTE — Assessment & Plan Note (Addendum)
Completed 2 weeks of  PPI, no further GI symptoms/nausea.  Diet controls. ?

## 2021-06-11 NOTE — Assessment & Plan Note (Signed)
Hgb a1c 5.7 04/25/21, diet controlled.  ?

## 2021-06-11 NOTE — Assessment & Plan Note (Signed)
takes Atorvastatin 5mg qd, LDL 79 08/15/20 

## 2021-06-11 NOTE — Assessment & Plan Note (Addendum)
Post op anemia, Hgb 9.4 05/12/21<<10.2 05/16/21, off Iron 2/2 nausea.  ?

## 2021-06-11 NOTE — Assessment & Plan Note (Signed)
takes Reclast, t-score -3.6 09/13/19 

## 2021-06-11 NOTE — Assessment & Plan Note (Signed)
S/p ORIF 05/09/21 by Dr. August Saucer, DVT prophylaxis ASA, prn Norco, Methocarbamol for pain. ?

## 2021-06-11 NOTE — Assessment & Plan Note (Signed)
stable, on MiraLax, Senokot S II bid.  

## 2021-06-11 NOTE — Assessment & Plan Note (Signed)
no active seizures since last seen, failed generic Tegretol in the past, on Tegretol 300mg  qd, Clonazepam hs (GDR to stop on her own didn't cause active seizures, but flare up insomnia and anxiety). S/p Neurology  ?

## 2021-06-12 ENCOUNTER — Other Ambulatory Visit: Payer: Self-pay | Admitting: Orthopedic Surgery

## 2021-06-12 DIAGNOSIS — G4700 Insomnia, unspecified: Secondary | ICD-10-CM

## 2021-06-12 DIAGNOSIS — R41841 Cognitive communication deficit: Secondary | ICD-10-CM | POA: Diagnosis not present

## 2021-06-12 DIAGNOSIS — R059 Cough, unspecified: Secondary | ICD-10-CM | POA: Diagnosis not present

## 2021-06-12 DIAGNOSIS — Z20822 Contact with and (suspected) exposure to covid-19: Secondary | ICD-10-CM | POA: Diagnosis not present

## 2021-06-12 DIAGNOSIS — S72145D Nondisplaced intertrochanteric fracture of left femur, subsequent encounter for closed fracture with routine healing: Secondary | ICD-10-CM | POA: Diagnosis not present

## 2021-06-12 DIAGNOSIS — R419 Unspecified symptoms and signs involving cognitive functions and awareness: Secondary | ICD-10-CM | POA: Diagnosis not present

## 2021-06-12 DIAGNOSIS — R051 Acute cough: Secondary | ICD-10-CM | POA: Diagnosis not present

## 2021-06-12 DIAGNOSIS — M6281 Muscle weakness (generalized): Secondary | ICD-10-CM | POA: Diagnosis not present

## 2021-06-12 DIAGNOSIS — R29898 Other symptoms and signs involving the musculoskeletal system: Secondary | ICD-10-CM | POA: Diagnosis not present

## 2021-06-12 DIAGNOSIS — R4189 Other symptoms and signs involving cognitive functions and awareness: Secondary | ICD-10-CM | POA: Diagnosis not present

## 2021-06-12 MED ORDER — CLONAZEPAM 1 MG PO TABS: 1.0000 mg | ORAL_TABLET | Freq: Every day | ORAL | 1 refills | Status: DC

## 2021-06-13 DIAGNOSIS — R41841 Cognitive communication deficit: Secondary | ICD-10-CM | POA: Diagnosis not present

## 2021-06-13 DIAGNOSIS — M6281 Muscle weakness (generalized): Secondary | ICD-10-CM | POA: Diagnosis not present

## 2021-06-13 DIAGNOSIS — R419 Unspecified symptoms and signs involving cognitive functions and awareness: Secondary | ICD-10-CM | POA: Diagnosis not present

## 2021-06-13 DIAGNOSIS — S72145D Nondisplaced intertrochanteric fracture of left femur, subsequent encounter for closed fracture with routine healing: Secondary | ICD-10-CM | POA: Diagnosis not present

## 2021-06-13 DIAGNOSIS — R4189 Other symptoms and signs involving cognitive functions and awareness: Secondary | ICD-10-CM | POA: Diagnosis not present

## 2021-06-13 DIAGNOSIS — R29898 Other symptoms and signs involving the musculoskeletal system: Secondary | ICD-10-CM | POA: Diagnosis not present

## 2021-06-14 DIAGNOSIS — R41841 Cognitive communication deficit: Secondary | ICD-10-CM | POA: Diagnosis not present

## 2021-06-14 DIAGNOSIS — R419 Unspecified symptoms and signs involving cognitive functions and awareness: Secondary | ICD-10-CM | POA: Diagnosis not present

## 2021-06-14 DIAGNOSIS — R4189 Other symptoms and signs involving cognitive functions and awareness: Secondary | ICD-10-CM | POA: Diagnosis not present

## 2021-06-14 DIAGNOSIS — M6281 Muscle weakness (generalized): Secondary | ICD-10-CM | POA: Diagnosis not present

## 2021-06-14 DIAGNOSIS — S72145D Nondisplaced intertrochanteric fracture of left femur, subsequent encounter for closed fracture with routine healing: Secondary | ICD-10-CM | POA: Diagnosis not present

## 2021-06-14 DIAGNOSIS — R29898 Other symptoms and signs involving the musculoskeletal system: Secondary | ICD-10-CM | POA: Diagnosis not present

## 2021-06-17 DIAGNOSIS — M6281 Muscle weakness (generalized): Secondary | ICD-10-CM | POA: Diagnosis not present

## 2021-06-17 DIAGNOSIS — R2681 Unsteadiness on feet: Secondary | ICD-10-CM | POA: Diagnosis not present

## 2021-06-17 DIAGNOSIS — R41841 Cognitive communication deficit: Secondary | ICD-10-CM | POA: Diagnosis not present

## 2021-06-17 DIAGNOSIS — M810 Age-related osteoporosis without current pathological fracture: Secondary | ICD-10-CM | POA: Diagnosis not present

## 2021-06-17 DIAGNOSIS — R29898 Other symptoms and signs involving the musculoskeletal system: Secondary | ICD-10-CM | POA: Diagnosis not present

## 2021-06-17 DIAGNOSIS — R4189 Other symptoms and signs involving cognitive functions and awareness: Secondary | ICD-10-CM | POA: Diagnosis not present

## 2021-06-17 DIAGNOSIS — R2689 Other abnormalities of gait and mobility: Secondary | ICD-10-CM | POA: Diagnosis not present

## 2021-06-17 DIAGNOSIS — Z9181 History of falling: Secondary | ICD-10-CM | POA: Diagnosis not present

## 2021-06-17 DIAGNOSIS — S72145D Nondisplaced intertrochanteric fracture of left femur, subsequent encounter for closed fracture with routine healing: Secondary | ICD-10-CM | POA: Diagnosis not present

## 2021-06-17 DIAGNOSIS — R419 Unspecified symptoms and signs involving cognitive functions and awareness: Secondary | ICD-10-CM | POA: Diagnosis not present

## 2021-06-18 DIAGNOSIS — R419 Unspecified symptoms and signs involving cognitive functions and awareness: Secondary | ICD-10-CM | POA: Diagnosis not present

## 2021-06-18 DIAGNOSIS — M6281 Muscle weakness (generalized): Secondary | ICD-10-CM | POA: Diagnosis not present

## 2021-06-18 DIAGNOSIS — R41841 Cognitive communication deficit: Secondary | ICD-10-CM | POA: Diagnosis not present

## 2021-06-18 DIAGNOSIS — S72145D Nondisplaced intertrochanteric fracture of left femur, subsequent encounter for closed fracture with routine healing: Secondary | ICD-10-CM | POA: Diagnosis not present

## 2021-06-18 DIAGNOSIS — R4189 Other symptoms and signs involving cognitive functions and awareness: Secondary | ICD-10-CM | POA: Diagnosis not present

## 2021-06-18 DIAGNOSIS — R29898 Other symptoms and signs involving the musculoskeletal system: Secondary | ICD-10-CM | POA: Diagnosis not present

## 2021-06-19 DIAGNOSIS — M6281 Muscle weakness (generalized): Secondary | ICD-10-CM | POA: Diagnosis not present

## 2021-06-19 DIAGNOSIS — R4189 Other symptoms and signs involving cognitive functions and awareness: Secondary | ICD-10-CM | POA: Diagnosis not present

## 2021-06-19 DIAGNOSIS — S72145D Nondisplaced intertrochanteric fracture of left femur, subsequent encounter for closed fracture with routine healing: Secondary | ICD-10-CM | POA: Diagnosis not present

## 2021-06-19 DIAGNOSIS — R41841 Cognitive communication deficit: Secondary | ICD-10-CM | POA: Diagnosis not present

## 2021-06-19 DIAGNOSIS — R29898 Other symptoms and signs involving the musculoskeletal system: Secondary | ICD-10-CM | POA: Diagnosis not present

## 2021-06-19 DIAGNOSIS — R419 Unspecified symptoms and signs involving cognitive functions and awareness: Secondary | ICD-10-CM | POA: Diagnosis not present

## 2021-06-21 ENCOUNTER — Inpatient Hospital Stay (HOSPITAL_BASED_OUTPATIENT_CLINIC_OR_DEPARTMENT_OTHER)
Admission: EM | Admit: 2021-06-21 | Discharge: 2021-06-28 | DRG: 478 | Disposition: A | Discharge status: Skilled Nursing Facility | Payer: Medicare Other | Attending: Internal Medicine | Admitting: Internal Medicine

## 2021-06-21 ENCOUNTER — Emergency Department (HOSPITAL_BASED_OUTPATIENT_CLINIC_OR_DEPARTMENT_OTHER): Payer: Medicare Other | Admitting: Radiology

## 2021-06-21 ENCOUNTER — Emergency Department (HOSPITAL_BASED_OUTPATIENT_CLINIC_OR_DEPARTMENT_OTHER): Payer: Medicare Other

## 2021-06-21 ENCOUNTER — Encounter (HOSPITAL_BASED_OUTPATIENT_CLINIC_OR_DEPARTMENT_OTHER): Payer: Self-pay

## 2021-06-21 ENCOUNTER — Other Ambulatory Visit: Payer: Self-pay

## 2021-06-21 DIAGNOSIS — R7303 Prediabetes: Secondary | ICD-10-CM | POA: Diagnosis present

## 2021-06-21 DIAGNOSIS — S32010A Wedge compression fracture of first lumbar vertebra, initial encounter for closed fracture: Principal | ICD-10-CM | POA: Diagnosis present

## 2021-06-21 DIAGNOSIS — G319 Degenerative disease of nervous system, unspecified: Secondary | ICD-10-CM | POA: Diagnosis not present

## 2021-06-21 DIAGNOSIS — Y92030 Kitchen in apartment as the place of occurrence of the external cause: Secondary | ICD-10-CM

## 2021-06-21 DIAGNOSIS — G40909 Epilepsy, unspecified, not intractable, without status epilepticus: Secondary | ICD-10-CM | POA: Diagnosis not present

## 2021-06-21 DIAGNOSIS — E785 Hyperlipidemia, unspecified: Secondary | ICD-10-CM | POA: Diagnosis present

## 2021-06-21 DIAGNOSIS — K5909 Other constipation: Secondary | ICD-10-CM | POA: Diagnosis present

## 2021-06-21 DIAGNOSIS — S22000A Wedge compression fracture of unspecified thoracic vertebra, initial encounter for closed fracture: Secondary | ICD-10-CM

## 2021-06-21 DIAGNOSIS — Z87891 Personal history of nicotine dependence: Secondary | ICD-10-CM

## 2021-06-21 DIAGNOSIS — R2689 Other abnormalities of gait and mobility: Secondary | ICD-10-CM | POA: Diagnosis not present

## 2021-06-21 DIAGNOSIS — Z681 Body mass index (BMI) 19 or less, adult: Secondary | ICD-10-CM | POA: Diagnosis not present

## 2021-06-21 DIAGNOSIS — Z881 Allergy status to other antibiotic agents status: Secondary | ICD-10-CM

## 2021-06-21 DIAGNOSIS — Z885 Allergy status to narcotic agent status: Secondary | ICD-10-CM | POA: Diagnosis not present

## 2021-06-21 DIAGNOSIS — Z20822 Contact with and (suspected) exposure to covid-19: Secondary | ICD-10-CM | POA: Diagnosis not present

## 2021-06-21 DIAGNOSIS — M545 Low back pain, unspecified: Secondary | ICD-10-CM | POA: Diagnosis not present

## 2021-06-21 DIAGNOSIS — S32010D Wedge compression fracture of first lumbar vertebra, subsequent encounter for fracture with routine healing: Secondary | ICD-10-CM | POA: Diagnosis not present

## 2021-06-21 DIAGNOSIS — Z79899 Other long term (current) drug therapy: Secondary | ICD-10-CM | POA: Diagnosis not present

## 2021-06-21 DIAGNOSIS — R29898 Other symptoms and signs involving the musculoskeletal system: Secondary | ICD-10-CM | POA: Diagnosis not present

## 2021-06-21 DIAGNOSIS — M4856XA Collapsed vertebra, not elsewhere classified, lumbar region, initial encounter for fracture: Secondary | ICD-10-CM | POA: Diagnosis not present

## 2021-06-21 DIAGNOSIS — W19XXXA Unspecified fall, initial encounter: Secondary | ICD-10-CM | POA: Diagnosis present

## 2021-06-21 DIAGNOSIS — Z833 Family history of diabetes mellitus: Secondary | ICD-10-CM

## 2021-06-21 DIAGNOSIS — F32A Depression, unspecified: Secondary | ICD-10-CM | POA: Diagnosis not present

## 2021-06-21 DIAGNOSIS — I959 Hypotension, unspecified: Secondary | ICD-10-CM | POA: Diagnosis not present

## 2021-06-21 DIAGNOSIS — S22060A Wedge compression fracture of T7-T8 vertebra, initial encounter for closed fracture: Secondary | ICD-10-CM | POA: Diagnosis not present

## 2021-06-21 DIAGNOSIS — S32000D Wedge compression fracture of unspecified lumbar vertebra, subsequent encounter for fracture with routine healing: Secondary | ICD-10-CM | POA: Diagnosis not present

## 2021-06-21 DIAGNOSIS — F419 Anxiety disorder, unspecified: Secondary | ICD-10-CM | POA: Diagnosis present

## 2021-06-21 DIAGNOSIS — R9431 Abnormal electrocardiogram [ECG] [EKG]: Secondary | ICD-10-CM | POA: Diagnosis not present

## 2021-06-21 DIAGNOSIS — Z880 Allergy status to penicillin: Secondary | ICD-10-CM

## 2021-06-21 DIAGNOSIS — Z90710 Acquired absence of both cervix and uterus: Secondary | ICD-10-CM | POA: Diagnosis not present

## 2021-06-21 DIAGNOSIS — R531 Weakness: Secondary | ICD-10-CM | POA: Diagnosis not present

## 2021-06-21 DIAGNOSIS — R42 Dizziness and giddiness: Secondary | ICD-10-CM | POA: Diagnosis not present

## 2021-06-21 DIAGNOSIS — E871 Hypo-osmolality and hyponatremia: Secondary | ICD-10-CM | POA: Diagnosis present

## 2021-06-21 DIAGNOSIS — Z800 Family history of malignant neoplasm of digestive organs: Secondary | ICD-10-CM | POA: Diagnosis not present

## 2021-06-21 DIAGNOSIS — W1839XA Other fall on same level, initial encounter: Secondary | ICD-10-CM | POA: Diagnosis present

## 2021-06-21 DIAGNOSIS — R296 Repeated falls: Secondary | ICD-10-CM | POA: Diagnosis present

## 2021-06-21 DIAGNOSIS — Z8669 Personal history of other diseases of the nervous system and sense organs: Secondary | ICD-10-CM | POA: Diagnosis not present

## 2021-06-21 DIAGNOSIS — M4854XA Collapsed vertebra, not elsewhere classified, thoracic region, initial encounter for fracture: Secondary | ICD-10-CM | POA: Diagnosis not present

## 2021-06-21 DIAGNOSIS — Z9181 History of falling: Secondary | ICD-10-CM | POA: Diagnosis not present

## 2021-06-21 DIAGNOSIS — M5127 Other intervertebral disc displacement, lumbosacral region: Secondary | ICD-10-CM | POA: Diagnosis present

## 2021-06-21 DIAGNOSIS — M25552 Pain in left hip: Secondary | ICD-10-CM | POA: Diagnosis not present

## 2021-06-21 DIAGNOSIS — Z7401 Bed confinement status: Secondary | ICD-10-CM | POA: Diagnosis not present

## 2021-06-21 DIAGNOSIS — G4700 Insomnia, unspecified: Secondary | ICD-10-CM | POA: Diagnosis not present

## 2021-06-21 DIAGNOSIS — M546 Pain in thoracic spine: Secondary | ICD-10-CM | POA: Diagnosis not present

## 2021-06-21 DIAGNOSIS — S32000A Wedge compression fracture of unspecified lumbar vertebra, initial encounter for closed fracture: Secondary | ICD-10-CM | POA: Diagnosis present

## 2021-06-21 DIAGNOSIS — S22000D Wedge compression fracture of unspecified thoracic vertebra, subsequent encounter for fracture with routine healing: Secondary | ICD-10-CM | POA: Diagnosis not present

## 2021-06-21 DIAGNOSIS — M810 Age-related osteoporosis without current pathological fracture: Secondary | ICD-10-CM | POA: Diagnosis not present

## 2021-06-21 DIAGNOSIS — R079 Chest pain, unspecified: Secondary | ICD-10-CM | POA: Diagnosis not present

## 2021-06-21 DIAGNOSIS — R109 Unspecified abdominal pain: Secondary | ICD-10-CM | POA: Diagnosis not present

## 2021-06-21 DIAGNOSIS — R739 Hyperglycemia, unspecified: Secondary | ICD-10-CM | POA: Diagnosis not present

## 2021-06-21 LAB — COMPREHENSIVE METABOLIC PANEL
ALT: 32 U/L (ref 0–44)
AST: 30 U/L (ref 15–41)
Albumin: 4.3 g/dL (ref 3.5–5.0)
Alkaline Phosphatase: 99 U/L (ref 38–126)
Anion gap: 8 (ref 5–15)
BUN: 13 mg/dL (ref 8–23)
CO2: 28 mmol/L (ref 22–32)
Calcium: 9.4 mg/dL (ref 8.9–10.3)
Chloride: 94 mmol/L — ABNORMAL LOW (ref 98–111)
Creatinine, Ser: 0.6 mg/dL (ref 0.44–1.00)
GFR, Estimated: >60 mL/min (ref >60)
Glucose, Bld: 170 mg/dL — ABNORMAL HIGH (ref 70–99)
Potassium: 4.2 mmol/L (ref 3.5–5.1)
Sodium: 130 mmol/L — ABNORMAL LOW (ref 135–145)
Total Bilirubin: 0.5 mg/dL (ref 0.3–1.2)
Total Protein: 6.6 g/dL (ref 6.5–8.1)

## 2021-06-21 LAB — CBC WITH DIFFERENTIAL/PLATELET
Abs Immature Granulocytes: 0.1 10*3/uL — ABNORMAL HIGH (ref 0.00–0.07)
Basophils Absolute: 0 10*3/uL (ref 0.0–0.1)
Basophils Relative: 0 %
Eosinophils Absolute: 0 10*3/uL (ref 0.0–0.5)
Eosinophils Relative: 0 %
HCT: 39.7 % (ref 36.0–46.0)
Hemoglobin: 13 g/dL (ref 12.0–15.0)
Immature Granulocytes: 1 %
Lymphocytes Relative: 7 %
Lymphs Abs: 0.7 10*3/uL (ref 0.7–4.0)
MCH: 31.9 pg (ref 26.0–34.0)
MCHC: 32.7 g/dL (ref 30.0–36.0)
MCV: 97.3 fL (ref 80.0–100.0)
Monocytes Absolute: 0.9 10*3/uL (ref 0.1–1.0)
Monocytes Relative: 9 %
Neutro Abs: 8.3 10*3/uL — ABNORMAL HIGH (ref 1.7–7.7)
Neutrophils Relative %: 83 %
Platelets: 215 10*3/uL (ref 150–400)
RBC: 4.08 MIL/uL (ref 3.87–5.11)
RDW: 13.2 % (ref 11.5–15.5)
WBC: 10 10*3/uL (ref 4.0–10.5)
nRBC: 0 % (ref 0.0–0.2)

## 2021-06-21 MED ORDER — ATORVASTATIN CALCIUM 10 MG PO TABS
5.0000 mg | ORAL_TABLET | Freq: Every day | ORAL | Status: DC
  Administered 2021-06-22 – 2021-06-28 (×6): 5 mg via ORAL
  Filled 2021-06-21 (×6): qty 1

## 2021-06-21 MED ORDER — CLONAZEPAM 1 MG PO TABS
1.0000 mg | ORAL_TABLET | Freq: Every day | ORAL | Status: DC
  Administered 2021-06-21 – 2021-06-27 (×7): 1 mg via ORAL
  Filled 2021-06-21 (×7): qty 1

## 2021-06-21 MED ORDER — LIDOCAINE 5 % EX PTCH
1.0000 | MEDICATED_PATCH | CUTANEOUS | Status: DC
  Administered 2021-06-21 – 2021-06-27 (×7): 1 via TRANSDERMAL
  Filled 2021-06-21 (×7): qty 1

## 2021-06-21 MED ORDER — MORPHINE SULFATE (PF) 2 MG/ML IV SOLN
0.5000 mg | INTRAVENOUS | Status: DC | PRN
Start: 2021-06-21 — End: 2021-06-28
  Administered 2021-06-24 – 2021-06-28 (×5): 0.5 mg via INTRAVENOUS
  Filled 2021-06-21 (×5): qty 1

## 2021-06-21 MED ORDER — HYDROCODONE-ACETAMINOPHEN 5-325 MG PO TABS: 1.0000 | ORAL_TABLET | Freq: Four times a day (QID) | ORAL | Status: DC | PRN

## 2021-06-21 MED ORDER — LACTATED RINGERS IV SOLN: INTRAVENOUS | Status: AC

## 2021-06-21 MED ORDER — MECLIZINE HCL 25 MG PO TABS: 12.5000 mg | ORAL_TABLET | Freq: Two times a day (BID) | ORAL | Status: DC | PRN

## 2021-06-21 MED ORDER — CARBAMAZEPINE 200 MG PO TABS
300.0000 mg | ORAL_TABLET | Freq: Every day | ORAL | Status: DC
  Administered 2021-06-21: 300 mg via ORAL
  Filled 2021-06-21 (×2): qty 1.5

## 2021-06-21 MED ORDER — LACTATED RINGERS IV SOLN: INTRAVENOUS | Status: DC

## 2021-06-21 MED ORDER — ONDANSETRON HCL 4 MG/2ML IJ SOLN
4.0000 mg | Freq: Four times a day (QID) | INTRAMUSCULAR | Status: DC | PRN
Start: 2021-06-21 — End: 2021-06-28
  Administered 2021-06-26: 4 mg via INTRAVENOUS
  Filled 2021-06-21: qty 2

## 2021-06-21 MED ORDER — KETOROLAC TROMETHAMINE 15 MG/ML IJ SOLN
15.0000 mg | Freq: Once | INTRAMUSCULAR | Status: AC
  Administered 2021-06-21: 15 mg via INTRAVENOUS
  Filled 2021-06-21: qty 1

## 2021-06-21 MED ORDER — ACETAMINOPHEN 325 MG PO TABS
650.0000 mg | ORAL_TABLET | Freq: Four times a day (QID) | ORAL | Status: DC | PRN
  Administered 2021-06-21 – 2021-06-25 (×6): 650 mg via ORAL
  Filled 2021-06-21 (×6): qty 2

## 2021-06-21 MED ORDER — LACTATED RINGERS IV BOLUS
1000.0000 mL | Freq: Once | INTRAVENOUS | Status: AC
  Administered 2021-06-21: 1000 mL via INTRAVENOUS

## 2021-06-21 MED ORDER — ENOXAPARIN SODIUM 30 MG/0.3ML IJ SOSY
30.0000 mg | PREFILLED_SYRINGE | INTRAMUSCULAR | Status: DC
  Administered 2021-06-21 – 2021-06-24 (×4): 30 mg via SUBCUTANEOUS
  Filled 2021-06-21 (×4): qty 0.3

## 2021-06-21 NOTE — Assessment & Plan Note (Addendum)
Continue Tegretol 300 mg p.o. at bedtime- require brand-name only for seizure control. ?-Follow-up with neurology outpatient ?

## 2021-06-21 NOTE — Assessment & Plan Note (Addendum)
Unclear etiology at this time and unable to test for BPPV on exam since patient has severe back pain.  Could be due to increased her Klonopin but reports that this has been gradual over the past several months. ?Meclizine PRN for dizziness ?Consider vestibular PT when back pain is controlled to allow for maneuver ?

## 2021-06-21 NOTE — H&P (Addendum)
?History and Physical  ? ? ?PatientAshyra Ochoa [id number] DOB: [birth year] ?DOA: 06/21/2021 ?DOS: the patient was seen and examined on 06/21/2021 ?PCP: Mast, Man X, NP  ?Patient coming from: [place]. Lives at [facility] ? ?Chief Complaint:  ?Chief Complaint  ?Patient presents with  ? Fall  ? Dizziness  ? ?HPI: Erin Ochoa is a 81 y.o. female with medical history significant of osteoporosis, epilespy, HLD, recent left hip fracture s/p ORIF presents for a fall. ? ?Pt just returned home yesterday from SNF following eft hip fracture s/p ORIF in March. She was sitting at her kitchen when she suddenly had severe vertigo and fell landing on her buttock. Had several back pain and presents to ED. She has had increase in her Klonopin for a quarter of a 0.5mg  pill up to 1mg  currently due to insomnia but reports titration was gradual over months.  ? ?In the ED, she was afebrile, normotensive on rrom air. CT head negative. L-spine and CXR showed mild T8 and mild-moderate L1 compression fracture. CBC, BMP unremarkable. Admission requested for pain control.  ? ?Review of Systems: As mentioned in the history of present illness. All other systems reviewed and are negative. ?Past Medical History:  ?Diagnosis Date  ? Anxiety   ? Cataract   ? Chronic constipation   ? Depression   ? Osteoporosis   ? Seizures ()   ? epilepsy  last seizure 1977  ? ?Past Surgical History:  ?Procedure Laterality Date  ? ABDOMINAL HYSTERECTOMY  1995  ? Dr. Edwyna Shell  ? CATARACT EXTRACTION Bilateral 12/2016  ? COLONOSCOPY  2012  ? patchy increased intraepithelial lymphocytes - draelos  ? ESOPHAGOGASTRODUODENOSCOPY    ? multiple  ? FISSURECTOMY    ? INTRAMEDULLARY (IM) NAIL INTERTROCHANTERIC Left 05/09/2021  ? Procedure: INTRAMEDULLARY (IM) NAIL INTERTROCHANTRIC;  Surgeon: Meredith Pel, MD;  Location: [facility];  Service: Orthopedics;  Laterality: Left;  ? WRIST SURGERY Left   ? due to fracture  ? ?Social History:   reports that she quit smoking about 40 years ago. Her smoking use included cigarettes. She started smoking about 65 years ago. She smoked an average of 1 pack per day. She has never used smokeless tobacco. She reports that she does not drink alcohol and does not use drugs. ? ?Allergies  ?Allergen Reactions  ? Cat Hair Extract Other (See Comments)  ?  Patient states that it causes her eyes to swell shut  ? Cefaclor Other (See Comments)  ?  unknown  ? Codeine Nausea Only  ? Fish Allergy Nausea Only  ? Iodinated Contrast Media Other (See Comments)  ?  Shrimp caused stomach pain and nausea ?Per patient can eat fried oysters okay ? ?  ? No Healthtouch Food Allergies   ?  Tomatoes, Red, Seeds causes Reflux ?  ? Other Nausea Only  ?  Unknown allergy ?Pain medications, Pt states she does not know exactly which ones. Pt typically takes Tylenol for  pain.  ? Oxycodone-Acetaminophen Nausea And Vomiting  ? Penicillins Itching  ? ? ?Family History  ?Problem Relation Age of Onset  ? Alcohol abuse Father   ? Diabetes Father   ? Diabetes Maternal Grandmother   ? Diabetes Paternal Grandmother   ? Colon cancer Maternal Grandfather   ? Esophageal cancer Neg Hx   ? Rectal cancer Neg Hx   ? Stomach cancer Neg Hx   ? ? ?Prior to Admission medications   ?Medication Sig Start Date End  Date Taking? Authorizing Provider  ?acetaminophen (TYLENOL) 500 MG tablet Take 1,000 mg by mouth every 6 (six) hours as needed for mild pain.    [provider]  ?Artificial Tear Solution (SOOTHE XP) SOLN Place 1 drop into both eyes in the morning, at noon, in the evening, and at bedtime.    [provider]  ?atorvastatin (LIPITOR) 10 MG tablet TAKE (1/2) TABLET DAILY. 04/15/21   Mast, Man X, NP  ?Calcium Carbonate-Vitamin D (CALTRATE 600+D PO) Take 1 tablet by mouth 2 (two) times daily. Chewable 11 am and 8:30 pm    [provider]  ?clonazePAM (KLONOPIN) 0.5 MG tablet Take 1 tablet (0.5 mg total) by mouth daily as needed for up to  14 days for anxiety. 05/28/21 06/11/21  Virgie Dad, MD  ?clonazePAM (KLONOPIN) 1 MG tablet Take 1 tablet (1 mg total) by mouth at bedtime. 06/12/21   Fargo, Amy E, NP  ?GOLYTELY 236 g solution MIX WITH WATER AND DRINK 3-3 1/2 OUNCES DAILY AS DIRECTED. 02/28/21   Mast, Man X, NP  ?Multiple Vitamins-Minerals (PRESERVISION AREDS PO) Take 1 tablet by mouth 2 (two) times daily.    [provider]  ?Nutritional Supplements (ENSURE ACTIVE HIGH PROTEIN) LIQD Take 237 mLs by mouth daily as needed (supplement).    [provider]  ?senna-docusate (SENOKOT-S) 8.6-50 MG tablet Take 2 tablets by mouth 2 (two) times daily. 05/13/21   Terrilee Croak, MD  ?TEGRETOL 200 MG tablet TAKE 1&1/2 TABLETS ONCE DAILY. 03/05/21   Mast, Man X, NP  ?Vitamin D, Ergocalciferol, (DRISDOL) 1.25 MG (50000 UNIT) CAPS capsule TAKE 1 CAPSULE WEEKLY. 09/18/20   Mast, Man X, NP  ? ? ?Physical Exam: ?Vitals:  ? 06/21/21 1335 06/21/21 1700 06/21/21 1730 06/21/21 1953  ?BP: (!) 151/75 (!) 143/56 (!) 136/55 (!) 127/57  ?Pulse: 96 91 82 82  ?Resp: 20 15 16 15   ?Temp:    97.9 ?F (36.6 ?C)  ?TempSrc:    Oral  ?SpO2: 100% 98% 95% 95%  ?Weight:      ?Height:      ? ?Constitutional: NAD, calm, elderly female laying stiff flat in bed  ?Eyes:  lids and conjunctivae normal ?ENMT: Mucous membranes are moist. ?Neck: normal, supple ?Respiratory: clear to auscultation bilaterally, no wheezing, no crackles. Normal respiratory effort. No accessory muscle use.  ?Cardiovascular: Regular rate and rhythm, no murmurs / rubs / gallops. No extremity edema.  ?Abdomen: no tenderness, no masses palpated. No hepatosplenomegaly. Bowel sounds positive.  ?Musculoskeletal: no clubbing / cyanosis. No joint deformity upper and lower extremities. Normal muscle tone.  ?Skin: no rashes, lesions, ulcers. No induration ?Neurologic: CN 2-12 grossly intact. Unable to tolerate LE strength testing due to back pain ?Psychiatric: Normal judgment and insight. Alert and oriented x 3.  Normal mood. ?Data Reviewed: ? ?See HPI ? ?Assessment and Plan: ?* Lumbar compression fracture ([place]) ?Thoracic compression fracture ?T8 and L1 s/p fall ?-Give IV Toradol x1  ?-Lidocaine patch ?-K pad ?-PRN hydrocodone-acetaminophen 5-325mg  q6hr moderate pain ?-PRN morphine 0.5mg  IV for severe pain ?-need PT evaluation once pain improves ? ?Vertigo ?Unclear etiology at this time and unable to test for BPPV on exam since patient has severe back pain.  Could be due to increased her Klonopin but reports that this has been gradual over the past several months. ?Meclizine PRN for dizziness ?Consider vestibular PT when back pain is controlled to allow for maneuver ? ?History of epilepsy ?Continue Tegretol 300 mg p.o. at bedtime- require brand-name  only for seizure control. ?-Follow-up with neurology outpatient ? ?Hyperlipidemia ?Continue statin ? ?Anxiety ?Continue Klonopin 1mg  qHS. Has gradually been increasing dosage due to increase tolerance and worsening insomnia.  ?Has been referred to psychiatry outpatient ? ?Osteoporosis ?Continue Caltrate ?Recommend follow with PCP to follow DEXA and to consider Bisphonate given repeat falls and fractures ? ? ? ? ? Advance Care Planning:   Code Status: Full Code  ? ?Consults: none ? ?Family Communication: No family at bedside ? ?Severity of Illness: ?The appropriate patient status for this patient is OBSERVATION. Observation status is judged to be reasonable and necessary in order to provide the required intensity of service to ensure the patient's safety. The patient's presenting symptoms, physical exam findings, and initial radiographic and laboratory data in the context of their medical condition is felt to place them at decreased risk for further clinical deterioration. Furthermore, it is anticipated that the patient will be medically stable for discharge from the hospital within 2 midnights of admission.  ? ?Author: Orene Desanctis, DO ?06/21/2021 9:42 PM ? ?For on call review  [email or web address].  ?

## 2021-06-21 NOTE — Progress Notes (Signed)
Pt arrived to [facility] from [facility] ED with 2 bags of belongings. Pt placement called [facility] will page MD. A&O x4. C/o dizziness r/t IV pain meds admin by [facility]. Pain 3/10 to back. Purewick placed. CHG bath completed. Pt oriented to unit, bed in lowest position, CB within reach. Bed alarm on.  ?

## 2021-06-21 NOTE — ED Provider Notes (Signed)
?[facility] EMERGENCY DEPT ?Provider Note ? ? ?CSN: [id number] ?Arrival date & time: 06/21/21  1109 ? ?  ? ?History ? ?Chief Complaint  ?Patient presents with  ? Fall  ? Dizziness  ? ? ?Erin Ochoa is a 81 y.o. female. ? ?81 year old female presents after a fall.  Patient states that she was standing in her kitchen making breakfast when she became dizzy and sat down on the ground.  Did not lose consciousness.  Complains of midthoracic back pain.  Denies any headache associated with this.  No chest pain dizziness or dyspnea or diaphoresis.  No lower back discomfort.  Patient denies any vertiginous type symptoms.  Called EMS and was transported here ? ? ?  ? ?Home Medications ?Prior to Admission medications   ?Medication Sig Start Date End Date Taking? Authorizing Provider  ?acetaminophen (TYLENOL) 500 MG tablet Take 1,000 mg by mouth every 6 (six) hours as needed for mild pain.    [provider]  ?Artificial Tear Solution (SOOTHE XP) SOLN Place 1 drop into both eyes in the morning, at noon, in the evening, and at bedtime.    [provider]  ?atorvastatin (LIPITOR) 10 MG tablet TAKE (1/2) TABLET DAILY. 04/15/21   Mast, Man X, NP  ?Calcium Carbonate-Vitamin D (CALTRATE 600+D PO) Take 1 tablet by mouth 2 (two) times daily. Chewable 11 am and 8:30 pm    [provider]  ?clonazePAM (KLONOPIN) 0.5 MG tablet Take 1 tablet (0.5 mg total) by mouth daily as needed for up to 14 days for anxiety. 05/28/21 06/11/21  Mahlon Gammon, MD  ?clonazePAM (KLONOPIN) 1 MG tablet Take 1 tablet (1 mg total) by mouth at bedtime. 06/12/21   Fargo, Amy E, NP  ?GOLYTELY 236 g solution MIX WITH WATER AND DRINK 3-3 1/2 OUNCES DAILY AS DIRECTED. 02/28/21   Mast, Man X, NP  ?Multiple Vitamins-Minerals (PRESERVISION AREDS PO) Take 1 tablet by mouth 2 (two) times daily.    [provider]  ?Nutritional Supplements (ENSURE ACTIVE HIGH PROTEIN) LIQD Take 237 mLs by mouth daily as needed (supplement).     [provider]  ?senna-docusate (SENOKOT-S) 8.6-50 MG tablet Take 2 tablets by mouth 2 (two) times daily. 05/13/21   Lorin Glass, MD  ?TEGRETOL 200 MG tablet TAKE 1&1/2 TABLETS ONCE DAILY. 03/05/21   Mast, Man X, NP  ?Vitamin D, Ergocalciferol, (DRISDOL) 1.25 MG (50000 UNIT) CAPS capsule TAKE 1 CAPSULE WEEKLY. 09/18/20   Mast, Man X, NP  ?   ? ?Allergies    ?Cat hair extract, Cefaclor, Codeine, Fish allergy, Iodinated contrast media, No healthtouch food allergies, Other, Oxycodone-acetaminophen, and Penicillins   ? ?Review of Systems   ?Review of Systems  ?All other systems reviewed and are negative. ? ?Physical Exam ?Updated Vital Signs ?BP (!) 149/64 (BP Location: Right Arm)   Pulse 79   Temp 98.1 ?F (36.7 ?C) (Oral)   Resp 15   Ht 1.702 m (5\' 7" )   Wt 47.8 kg   SpO2 98%   BMI 16.50 kg/m?  ?Physical Exam ?Vitals and nursing note reviewed.  ?Constitutional:   ?   General: She is not in acute distress. ?   Appearance: Normal appearance. She is well-developed. She is not toxic-appearing.  ?HENT:  ?   Head: Normocephalic and atraumatic.  ?Eyes:  ?   General: Lids are normal.  ?   Conjunctiva/sclera: Conjunctivae normal.  ?   Pupils: Pupils are equal, round, and reactive to light.  ?Neck:  ?  Thyroid: No thyroid mass.  ?   Trachea: No tracheal deviation.  ?Cardiovascular:  ?   Rate and Rhythm: Normal rate and regular rhythm.  ?   Heart sounds: Normal heart sounds. No murmur heard. ?  No gallop.  ?Pulmonary:  ?   Effort: Pulmonary effort is normal. No respiratory distress.  ?   Breath sounds: Normal breath sounds. No stridor. No decreased breath sounds, wheezing, rhonchi or rales.  ?Abdominal:  ?   General: There is no distension.  ?   Palpations: Abdomen is soft.  ?   Tenderness: There is no abdominal tenderness. There is no rebound.  ?Musculoskeletal:     ?   General: No tenderness. Normal range of motion.  ?   Cervical back: Normal range of motion and neck supple.  ?Skin: ?   General: Skin is warm and  dry.  ?   Findings: No abrasion or rash.  ?Neurological:  ?   Mental Status: She is alert and oriented to person, place, and time. Mental status is at baseline.  ?   GCS: GCS eye subscore is 4. GCS verbal subscore is 5. GCS motor subscore is 6.  ?   Cranial Nerves: No cranial nerve deficit.  ?   Sensory: No sensory deficit.  ?   Motor: Motor function is intact.  ?Psychiatric:     ?   Attention and Perception: Attention normal.     ?   Speech: Speech normal.     ?   Behavior: Behavior normal.  ? ? ?ED Results / Procedures / Treatments   ?Labs ?(all labs ordered are listed, but only abnormal results are displayed) ?Labs Reviewed - No data to display ? ?EKG ?EKG Interpretation ? ?Date/Time:  Friday Jun 21 2021 11:46:25 EDT ?Ventricular Rate:  83 ?PR Interval:  205 ?QRS Duration: 78 ?QT Interval:  360 ?QTC Calculation: 423 ?R Axis:   75 ?Text Interpretation: Sinus rhythm No significant change since last tracing Confirmed by Lorre Nick ([id number]) on 06/21/2021 12:31:01 PM ? ?Radiology ?No results found. ? ?Procedures ?Procedures  ? ? ?Medications Ordered in ED ?Medications  ?lactated ringers bolus 1,000 mL (has no administration in time range)  ?lactated ringers infusion (has no administration in time range)  ? ? ?ED Course/ Medical Decision Making/ A&P ?  ?                        ?Medical Decision Making ?Amount and/or Complexity of Data Reviewed ?Labs: ordered. ?Radiology: ordered. ?ECG/medicine tests: ordered. ? ?Risk ?Prescription drug management. ? ? ?Patient is EKG per my interpretation showed no signs of acute ischemic changes.  Patient's plain films of her lumbar and thoracic spine showed 2 compression fractures.  Due to patient's need for ongoing pain management she will require admission.  Discussed with hospitalist will admit the patient ? ? ? ? ? ? ? ?Final Clinical Impression(s) / ED Diagnoses ?Final diagnoses:  ?None  ? ? ?Rx / DC Orders ?ED Discharge Orders   ? ? None  ? ?  ? ? ?  ?Lorre Nick,  MD ?06/21/21 1423 ? ?

## 2021-06-21 NOTE — Assessment & Plan Note (Signed)
Continue Klonopin 1mg  qHS. Has gradually been increasing dosage due to increase tolerance and worsening insomnia.  ?Has been referred to psychiatry outpatient ?

## 2021-06-21 NOTE — ED Triage Notes (Signed)
Pt. States felt dizzy and sat down really hard on the floor. C/O pain in back above and below waist line. States she does have vertigo. Denies being on blood thinners, but does take aspirin everyday. Pain 8/10 pain. Feels like pain is across the back but more to the right.  ?

## 2021-06-21 NOTE — Assessment & Plan Note (Addendum)
Continue Caltrate ?Recommend follow with PCP to follow DEXA and to consider Bisphonate given repeat falls and fractures ?

## 2021-06-21 NOTE — Assessment & Plan Note (Signed)
Continue statin. 

## 2021-06-21 NOTE — Assessment & Plan Note (Addendum)
Thoracic compression fracture ?T8 and L1 s/p fall ?-Give IV Toradol x1  ?-Lidocaine patch ?-K pad ?-PRN hydrocodone-acetaminophen 5-325mg  q6hr moderate pain ?-PRN morphine 0.5mg  IV for severe pain ?-need PT evaluation once pain improves ?

## 2021-06-22 DIAGNOSIS — E871 Hypo-osmolality and hyponatremia: Secondary | ICD-10-CM | POA: Diagnosis present

## 2021-06-22 DIAGNOSIS — Z79899 Other long term (current) drug therapy: Secondary | ICD-10-CM | POA: Diagnosis not present

## 2021-06-22 DIAGNOSIS — Z885 Allergy status to narcotic agent status: Secondary | ICD-10-CM | POA: Diagnosis not present

## 2021-06-22 DIAGNOSIS — M4856XA Collapsed vertebra, not elsewhere classified, lumbar region, initial encounter for fracture: Secondary | ICD-10-CM | POA: Diagnosis not present

## 2021-06-22 DIAGNOSIS — M810 Age-related osteoporosis without current pathological fracture: Secondary | ICD-10-CM | POA: Diagnosis present

## 2021-06-22 DIAGNOSIS — K5909 Other constipation: Secondary | ICD-10-CM | POA: Diagnosis present

## 2021-06-22 DIAGNOSIS — W1839XA Other fall on same level, initial encounter: Secondary | ICD-10-CM | POA: Diagnosis present

## 2021-06-22 DIAGNOSIS — R296 Repeated falls: Secondary | ICD-10-CM | POA: Diagnosis present

## 2021-06-22 DIAGNOSIS — Z87891 Personal history of nicotine dependence: Secondary | ICD-10-CM | POA: Diagnosis not present

## 2021-06-22 DIAGNOSIS — W19XXXA Unspecified fall, initial encounter: Secondary | ICD-10-CM | POA: Diagnosis present

## 2021-06-22 DIAGNOSIS — R7303 Prediabetes: Secondary | ICD-10-CM | POA: Diagnosis present

## 2021-06-22 DIAGNOSIS — Z800 Family history of malignant neoplasm of digestive organs: Secondary | ICD-10-CM | POA: Diagnosis not present

## 2021-06-22 DIAGNOSIS — S32000A Wedge compression fracture of unspecified lumbar vertebra, initial encounter for closed fracture: Secondary | ICD-10-CM | POA: Diagnosis present

## 2021-06-22 DIAGNOSIS — R42 Dizziness and giddiness: Secondary | ICD-10-CM | POA: Diagnosis present

## 2021-06-22 DIAGNOSIS — E785 Hyperlipidemia, unspecified: Secondary | ICD-10-CM | POA: Diagnosis present

## 2021-06-22 DIAGNOSIS — Z20822 Contact with and (suspected) exposure to covid-19: Secondary | ICD-10-CM | POA: Diagnosis not present

## 2021-06-22 DIAGNOSIS — S32010A Wedge compression fracture of first lumbar vertebra, initial encounter for closed fracture: Secondary | ICD-10-CM | POA: Diagnosis present

## 2021-06-22 DIAGNOSIS — R2689 Other abnormalities of gait and mobility: Secondary | ICD-10-CM | POA: Diagnosis not present

## 2021-06-22 DIAGNOSIS — Z681 Body mass index (BMI) 19 or less, adult: Secondary | ICD-10-CM | POA: Diagnosis not present

## 2021-06-22 DIAGNOSIS — R29898 Other symptoms and signs involving the musculoskeletal system: Secondary | ICD-10-CM | POA: Diagnosis not present

## 2021-06-22 DIAGNOSIS — S32000D Wedge compression fracture of unspecified lumbar vertebra, subsequent encounter for fracture with routine healing: Secondary | ICD-10-CM | POA: Diagnosis not present

## 2021-06-22 DIAGNOSIS — F32A Depression, unspecified: Secondary | ICD-10-CM | POA: Diagnosis present

## 2021-06-22 DIAGNOSIS — R531 Weakness: Secondary | ICD-10-CM | POA: Diagnosis not present

## 2021-06-22 DIAGNOSIS — M25552 Pain in left hip: Secondary | ICD-10-CM | POA: Diagnosis not present

## 2021-06-22 DIAGNOSIS — S22060A Wedge compression fracture of T7-T8 vertebra, initial encounter for closed fracture: Secondary | ICD-10-CM | POA: Diagnosis present

## 2021-06-22 DIAGNOSIS — Z880 Allergy status to penicillin: Secondary | ICD-10-CM | POA: Diagnosis not present

## 2021-06-22 DIAGNOSIS — G4700 Insomnia, unspecified: Secondary | ICD-10-CM | POA: Diagnosis present

## 2021-06-22 DIAGNOSIS — Z8669 Personal history of other diseases of the nervous system and sense organs: Secondary | ICD-10-CM | POA: Diagnosis not present

## 2021-06-22 DIAGNOSIS — Y92030 Kitchen in apartment as the place of occurrence of the external cause: Secondary | ICD-10-CM | POA: Diagnosis not present

## 2021-06-22 DIAGNOSIS — Z7401 Bed confinement status: Secondary | ICD-10-CM | POA: Diagnosis not present

## 2021-06-22 DIAGNOSIS — Z90710 Acquired absence of both cervix and uterus: Secondary | ICD-10-CM | POA: Diagnosis not present

## 2021-06-22 DIAGNOSIS — G40909 Epilepsy, unspecified, not intractable, without status epilepticus: Secondary | ICD-10-CM | POA: Diagnosis present

## 2021-06-22 DIAGNOSIS — Z833 Family history of diabetes mellitus: Secondary | ICD-10-CM | POA: Diagnosis not present

## 2021-06-22 DIAGNOSIS — Z9181 History of falling: Secondary | ICD-10-CM | POA: Diagnosis not present

## 2021-06-22 DIAGNOSIS — M4854XA Collapsed vertebra, not elsewhere classified, thoracic region, initial encounter for fracture: Secondary | ICD-10-CM | POA: Diagnosis not present

## 2021-06-22 DIAGNOSIS — S22000D Wedge compression fracture of unspecified thoracic vertebra, subsequent encounter for fracture with routine healing: Secondary | ICD-10-CM | POA: Diagnosis not present

## 2021-06-22 DIAGNOSIS — G319 Degenerative disease of nervous system, unspecified: Secondary | ICD-10-CM | POA: Diagnosis not present

## 2021-06-22 DIAGNOSIS — S32010D Wedge compression fracture of first lumbar vertebra, subsequent encounter for fracture with routine healing: Secondary | ICD-10-CM | POA: Diagnosis not present

## 2021-06-22 DIAGNOSIS — F419 Anxiety disorder, unspecified: Secondary | ICD-10-CM | POA: Diagnosis present

## 2021-06-22 DIAGNOSIS — M5127 Other intervertebral disc displacement, lumbosacral region: Secondary | ICD-10-CM | POA: Diagnosis present

## 2021-06-22 DIAGNOSIS — S22000A Wedge compression fracture of unspecified thoracic vertebra, initial encounter for closed fracture: Secondary | ICD-10-CM

## 2021-06-22 DIAGNOSIS — Z881 Allergy status to other antibiotic agents status: Secondary | ICD-10-CM | POA: Diagnosis not present

## 2021-06-22 LAB — BASIC METABOLIC PANEL
Anion gap: 5 (ref 5–15)
BUN: 9 mg/dL (ref 8–23)
CO2: 28 mmol/L (ref 22–32)
Calcium: 8.7 mg/dL — ABNORMAL LOW (ref 8.9–10.3)
Chloride: 100 mmol/L (ref 98–111)
Creatinine, Ser: 0.52 mg/dL (ref 0.44–1.00)
GFR, Estimated: >60 mL/min (ref >60)
Glucose, Bld: 155 mg/dL — ABNORMAL HIGH (ref 70–99)
Potassium: 3.7 mmol/L (ref 3.5–5.1)
Sodium: 133 mmol/L — ABNORMAL LOW (ref 135–145)

## 2021-06-22 MED ORDER — METHOCARBAMOL 500 MG PO TABS
500.0000 mg | ORAL_TABLET | Freq: Three times a day (TID) | ORAL | Status: DC | PRN
  Administered 2021-06-22 – 2021-06-28 (×7): 500 mg via ORAL
  Filled 2021-06-22 (×7): qty 1

## 2021-06-22 MED ORDER — POLYETHYLENE GLYCOL 3350 17 G PO PACK
17.0000 g | PACK | Freq: Every day | ORAL | Status: DC
  Administered 2021-06-23 – 2021-06-28 (×5): 17 g via ORAL
  Filled 2021-06-22 (×6): qty 1

## 2021-06-22 MED ORDER — SOOTHE XP OP SOLN: 1.0000 [drp] | Freq: Every day | OPHTHALMIC | Status: DC

## 2021-06-22 MED ORDER — KETOROLAC TROMETHAMINE 15 MG/ML IJ SOLN
15.0000 mg | Freq: Three times a day (TID) | INTRAMUSCULAR | Status: AC | PRN
  Administered 2021-06-22 – 2021-06-24 (×5): 15 mg via INTRAVENOUS
  Filled 2021-06-22 (×5): qty 1

## 2021-06-22 MED ORDER — POLYVINYL ALCOHOL 1.4 % OP SOLN
1.0000 [drp] | OPHTHALMIC | Status: DC | PRN
  Administered 2021-06-25: 1 [drp] via OPHTHALMIC
  Filled 2021-06-22: qty 15

## 2021-06-22 MED ORDER — CARBAMAZEPINE 200 MG PO TABS
300.0000 mg | ORAL_TABLET | Freq: Every day | ORAL | Status: DC
Start: 2021-06-22 — End: 2021-06-28
  Administered 2021-06-22 – 2021-06-27 (×6): 300 mg via ORAL
  Filled 2021-06-22 (×8): qty 1.5

## 2021-06-22 NOTE — Progress Notes (Signed)
? ? ? [facility] ?                                                                            ? ? ?Erin Ochoa, is a 81 y.o. female, DOB - 1940-04-22, EVO:[id number] ?Admit date - 06/21/2021    ?Outpatient Primary MD for the patient is Mast, Man X, NP ? ?LOS - 0  days ? ? ? ?Brief summary  ? ?Erin Ochoa is a 81 y.o. female with medical history significant of osteoporosis, epilespy, HLD, recent left hip fracture s/p ORIF presents for a fall. ? ?In the ED, she was afebrile, normotensive on rrom air. CT head negative. L-spine and CXR showed mild T8 and mild-moderate L1 compression fracture. CBC, BMP unremarkable. Admission requested for pain control.  ?  ? ?Assessment & Plan  ? ? ?Assessment and Plan: ?* Lumbar compression fracture ([name]) ?Thoracic compression fracture ?T8 and L1 s/p fall ?Pain control and therapy evaluation .  ?IR consult for kyphoplasty.  ? ? ?Vertigo ?Unclear etiology at this time and unable to test for BPPV on exam since patient has severe back pain.  Could be due to increased her Klonopin but reports that this has been gradual over the past several months. ?Meclizine PRN for dizziness ?Will need Vestibular evaluation when she is able to tolerate pain and work with PT.  ?Check orthostatics.  ? ?History of epilepsy ?Continue Tegretol 300 mg p.o. at bedtime- require brand-name only for seizure control. ?-Follow-up with neurology outpatient ? ?Hyperlipidemia ?Continue statin ? ?Anxiety ?Recommend outpatient follow up with psychiatry . Continue with klonopin.  ? ?Osteoporosis ?Continue Caltrate ?Recommend follow with PCP to follow DEXA and to consider Bisphonate given repeat falls and fractures ? ? ? ?Hyponatremia:  ?- improved with IV fluids.  ?- continue to monitor.  ? ?  ? ?Estimated body mass index is 16.5 kg/m? as calculated from the following: ?  Height as of this encounter: 5\' 7"  (1.702 m). ?  Weight as of this encounter: 47.8 kg. ? ?Code Status: full code.  ?DVT Prophylaxis:   enoxaparin (LOVENOX) injection 30 mg Start: 06/21/21 2145 ? ? ?Level of Care: Level of care: Telemetry Medical ?Family Communication: none at bedside.  ? ?Disposition Plan:     Remains inpatient appropriate:  IR consultation for kyphoplasty and therapy evaluation.  ? ?Procedures:  ?None.  ?Consultants:   ?None.  ? ?Antimicrobials:  ? ?Anti-infectives (From admission, onward)  ? ? None  ? ?  ? ? ? ?Medications ? ?Scheduled Meds: ? atorvastatin  5 mg Oral Daily  ? carbamazepine  300 mg Oral QHS  ? clonazePAM  1 mg Oral QHS  ? enoxaparin (LOVENOX) injection  30 mg Subcutaneous Q24H  ? lidocaine  1 patch Transdermal Q24H  ? ?Continuous Infusions: ?PRN Meds:.acetaminophen, ketorolac, meclizine, methocarbamol, morphine injection, ondansetron (ZOFRAN) IV ? ? ? ?Subjective:  ? ?Erin Ochoa was seen and examined today.  Pain when she moves. No dizziness today.  ? ?Objective:  ? ?Vitals:  ? 06/21/21 2349 06/22/21 0319 06/22/21 0750 06/22/21 1030  ?BP: (!) 134/54 (!) 109/51 (!) 114/55 (!) 142/75  ?Pulse: 78 67 79 85  ?Resp: 16 13 16 18   ?Temp: 98 ?F (  36.7 ?C) 98.1 ?F (36.7 ?C) 98.2 ?F (36.8 ?C) 98.2 ?F (36.8 ?C)  ?TempSrc: Oral Oral Oral Oral  ?SpO2: 97% 96% 96% 97%  ?Weight:      ?Height:      ? ? ?Intake/Output Summary (Last 24 hours) at 06/22/2021 1340 ?Last data filed at 06/22/2021 0626 ?Gross per 24 hour  ?Intake 1249.03 ml  ?Output 1000 ml  ?Net 249.03 ml  ? ?Filed Weights  ? 06/21/21 1126  ?Weight: 47.8 kg  ? ? ? ?General exam: Appears calm and comfortable  ?Respiratory system: Clear to auscultation. Respiratory effort normal. ?Cardiovascular system: S1 & S2 heard, RRR. No JVD, . No pedal edema. ?Gastrointestinal system: Abdomen is nondistended, soft and nontender. . Normal bowel sounds heard. ?Central nervous system: Alert and oriented. No focal neurological deficits. ?Extremities: Symmetric 5 x 5 power. ?Skin: No rashes, lesions or ulcers ?Psychiatry: Mood & affect appropriate.  ? ? ? ?Data Reviewed:  I have personally  reviewed following labs and imaging studies ? ? ?CBC ?Lab Results  ?Component Value Date  ? WBC 10.0 06/21/2021  ? RBC 4.08 06/21/2021  ? HGB 13.0 06/21/2021  ? HCT 39.7 06/21/2021  ? MCV 97.3 06/21/2021  ? MCH 31.9 06/21/2021  ? PLT 215 06/21/2021  ? MCHC 32.7 06/21/2021  ? RDW 13.2 06/21/2021  ? LYMPHSABS 0.7 06/21/2021  ? MONOABS 0.9 06/21/2021  ? EOSABS 0.0 06/21/2021  ? BASOSABS 0.0 06/21/2021  ? ? ? ?Last metabolic panel ?Lab Results  ?Component Value Date  ? NA 133 (L) 06/22/2021  ? K 3.7 06/22/2021  ? CL 100 06/22/2021  ? CO2 28 06/22/2021  ? BUN 9 06/22/2021  ? CREATININE 0.52 06/22/2021  ? GLUCOSE 155 (H) 06/22/2021  ? GFRNONAA >60 06/22/2021  ? GFRAA 99 08/15/2020  ? CALCIUM 8.7 (L) 06/22/2021  ? PROT 6.6 06/21/2021  ? ALBUMIN 4.3 06/21/2021  ? BILITOT 0.5 06/21/2021  ? ALKPHOS 99 06/21/2021  ? AST 30 06/21/2021  ? ALT 32 06/21/2021  ? ANIONGAP 5 06/22/2021  ? ? ?CBG (last 3)  ?No results for input(s): GLUCAP in the last 72 hours.  ? ? ?Coagulation Profile: ?No results for input(s): INR, PROTIME in the last 168 hours. ? ? ?Radiology Studies: ?DG Chest 2 View ? ?Result Date: 06/21/2021 ?CLINICAL DATA:  Lower back pain. EXAM: CHEST - 2 VIEW COMPARISON:  May 09, 2021. FINDINGS: The heart size and mediastinal contours are within normal limits. Both lungs are clear. Mild compression deformity is seen involving the T8 and L1 vertebral bodies concerning for fractures of indeterminate age. IMPRESSION: No acute cardiopulmonary abnormality seen. Mild compression deformity is seen involving the T8 and L1 vertebral bodies concerning for fractures of indeterminate age. Electronically Signed   By: Lupita Raider M.D.   On: 06/21/2021 12:21  ? ?DG Thoracic Spine 2 View ? ?Result Date: 06/21/2021 ?CLINICAL DATA:  Upper back pain after fall. EXAM: THORACIC SPINE 2 VIEWS COMPARISON:  None Available. FINDINGS: Mild compression deformity of T8 vertebral body is noted concerning for fracture of indeterminate age. No  spondylolisthesis is noted. Disc spaces are well-maintained. IMPRESSION: Mild compression deformity of T8 vertebral body is noted concerning for fracture of indeterminate age. MRI may be performed for further evaluation. Electronically Signed   By: Lupita Raider M.D.   On: 06/21/2021 12:23  ? ?DG Lumbar Spine Complete ? ?Result Date: 06/21/2021 ?CLINICAL DATA:  Lower back pain after fall. EXAM: LUMBAR SPINE - COMPLETE 4+ VIEW COMPARISON:  CT of  April 29, 2018. FINDINGS: There is now noted mild to moderate compression deformity of L1 vertebral body concerning for fracture of indeterminate age. No spondylolisthesis is noted. Mild degenerative disc disease is noted at L2-3. Osteopenia is noted. IMPRESSION: Mild to moderate compression deformity of L1 vertebral body concerning for fracture of indeterminate age. MRI may be performed for further evaluation. Electronically Signed   By: Lupita RaiderJames  Green Jr M.D.   On: 06/21/2021 12:22  ? ?CT Head Wo Contrast ? ?Result Date: 06/21/2021 ?CLINICAL DATA:  Dizziness EXAM: CT HEAD WITHOUT CONTRAST TECHNIQUE: Contiguous axial images were obtained from the base of the skull through the vertex without intravenous contrast. RADIATION DOSE REDUCTION: This exam was performed according to the departmental dose-optimization program which includes automated exposure control, adjustment of the mA and/or kV according to patient size and/or use of iterative reconstruction technique. COMPARISON:  None Available. FINDINGS: Brain: Chronic white matter ischemic change. Age related global atrophy. No evidence of acute infarction, hemorrhage, hydrocephalus, extra-axial collection or mass lesion/mass effect. Vascular: No hyperdense vessel or unexpected calcification. Skull: Normal. Negative for fracture or focal lesion. Sinuses/Orbits: No acute finding. Other: None. IMPRESSION: No acute intracranial abnormality. Electronically Signed   By: Allegra LaiLeah  Strickland M.D.   On: 06/21/2021 13:58   ? ? ? ? ?Kathlen ModyVijaya  Sabastian Raimondi M.D. ?[facility] ?06/22/2021, 1:40 PM ? ?Available via Epic secure chat 7am-7pm ?After 7 pm, please refer to night coverage provider listed on amion. ? ? ? ?

## 2021-06-22 NOTE — Evaluation (Signed)
Occupational Therapy Evaluation ?Patient Details ?Name: Erin Ochoa ?MRN: [id number] ?DOB: 01/01/1941 ?Today's Date: 06/22/2021 ? ? ?History of Present Illness Erin Ochoa is a 81 y.o. female who presented s/p fall in her kitchen wtih immediate back pain. Imaging revealed  mild T8 and mild-moderate L1 compression fracture. Pt with medical history significant of osteoporosis, epilespy, HLD, recent left hip fracture s/p ORIF (3/23)  ? ?Clinical Impression ?  ?Chakia was evaluated s/p the above fall. She fell in March and had a LLE ORIF, she was d/c'd to the SNF [facility], progressed to the ALF, and was finally d/c'd to her ILF apartment the day prior to her most recent fall. Pt still working with PT/OT. Per pt she had worked herself back to being mod I with most ADLs and ambulation with use of RW. Pt premedicated prior to session, and reported minimal pain throughout. Educated on back precautions and pt demonstrated good understanding. Overall she requires min A for mobility with RW and up to max A for LB ADLs. Pt will benefit from OT acutely. Recommend pt d/c back to ALF that she is familiar with with continued OT/PT. ?   ? ?Recommendations for follow up therapy are one component of a multi-disciplinary discharge planning process, led by the attending physician.  Recommendations may be updated based on patient status, additional functional criteria and insurance authorization.  ? ?Follow Up Recommendations ? Home health OT (d/c to ALF with HHOT)  ?  ?Assistance Recommended at Discharge Frequent or constant Supervision/Assistance  ?Patient can return home with the following A little help with walking and/or transfers;A little help with bathing/dressing/bathroom;Help with stairs or ramp for entrance;Assist for transportation ? ?  ?Functional Status Assessment ? Patient has had a recent decline in their functional status and demonstrates the ability to make significant improvements in function in a reasonable  and predictable amount of time.  ?Equipment Recommendations ? BSC/3in1  ?  ?Recommendations for Other Services   ? ? ?  ?Precautions / Restrictions Precautions ?Precautions: Fall;Back ?Precaution Booklet Issued: No ?Precaution Comments: verbally reviewed ?Restrictions ?Weight Bearing Restrictions: No  ? ?  ? ?Mobility Bed Mobility ?Overal bed mobility: Needs Assistance ?Bed Mobility: Rolling, Sidelying to Sit, Sit to Sidelying ?Rolling: Min guard ?Sidelying to sit: Min guard ?  ?  ?Sit to sidelying: Min assist ?General bed mobility comments: cues for log roll ?  ? ?Transfers ?Overall transfer level: Needs assistance ?Equipment used: Rolling walker (2 wheels) ?Transfers: Sit to/from Stand ?Sit to Stand: Min guard ?  ?  ?  ?  ?  ?General transfer comment: with verbal cues ?  ? ?  ?Balance Overall balance assessment: Needs assistance, History of Falls ?Sitting-balance support: Feet supported ?Sitting balance-Leahy Scale: Fair ?  ?  ?Standing balance support: Bilateral upper extremity supported, During functional activity ?Standing balance-Leahy Scale: Poor ?  ?  ?  ?  ?  ?  ?  ?  ?  ?  ?  ?  ?   ? ?ADL either performed or assessed with clinical judgement  ? ?ADL Overall ADL's : Needs assistance/impaired ?Eating/Feeding: Independent;Sitting ?  ?Grooming: Set up;Sitting ?  ?Upper Body Bathing: Set up;Sitting ?  ?Lower Body Bathing: Moderate assistance;Sit to/from stand ?  ?Upper Body Dressing : Set up;Sitting ?  ?Lower Body Dressing: Maximal assistance;Sit to/from stand ?  ?Toilet Transfer: Minimal assistance;Ambulation;Rolling walker (2 wheels) ?  ?Toileting- Clothing Manipulation and Hygiene: Minimal assistance;Sit to/from stand ?  ?  ?  ?Functional mobility  during ADLs: Minimal assistance;Rolling walker (2 wheels) ?General ADL Comments: pain very well managed this session, cues for back precautions  ? ? ? ?Vision Baseline Vision/History: 0 No visual deficits ?Ability to See in Adequate Light: 0 Adequate ?Patient  Visual Report: No change from baseline ?Vision Assessment?: No apparent visual deficits  ?   ?Perception   ?  ?Praxis   ?  ? ?Pertinent Vitals/Pain Pain Assessment ?Pain Assessment: 0-10 ?Pain Score: 3  ?Pain Location: back ?Pain Descriptors / Indicators: Discomfort ?Pain Intervention(s): Limited activity within patient's tolerance, Monitored during session, Premedicated before session  ? ? ? ?Hand Dominance Right ?  ?Extremity/Trunk Assessment Upper Extremity Assessment ?Upper Extremity Assessment: Generalized weakness ?  ?Lower Extremity Assessment ?Lower Extremity Assessment: Overall WFL for tasks assessed;Defer to PT evaluation ?  ?Cervical / Trunk Assessment ?Cervical / Trunk Assessment: Other exceptions ?Cervical / Trunk Exceptions: compression fxs ?  ?Communication Communication ?Communication: No difficulties (a bit tangential) ?  ?Cognition Arousal/Alertness: Awake/alert ?Behavior During Therapy: Anxious ?Overall Cognitive Status: Within Functional Limits for tasks assessed ?  ?  ?  ?  ?  ?  ?  ?  ?  ?  ?  ?  ?  ?  ?  ?  ?General Comments: good recall of precautions after review - tangentail throughout and can lose train of thought ?  ?  ?General Comments  VSS, denies dizziness ? ?  ?Exercises   ?  ?Shoulder Instructions    ? ? ?Home Living Family/patient expects to be discharged to:: Assisted living ?  ?  ?  ?  ?  ?  ?  ?  ?  ?  ?  ?  ?  ?  ?Home Equipment: Conservation officer, nature (2 wheels);Shower seat;Grab bars - tub/shower ?  ?Additional Comments: Pt from [facility] - s/p her LLE ORIF she d/c'd to the facilities SNF, then transitioned to the ALF, and spent one day home at the [facility]. ILF: level entry, elevator access, walk in shower with seat and grab bars, standard toilet. DME: RW ?  ? ?  ?Prior Functioning/Environment Prior Level of Function : Independent/Modified Independent;Needs assist ?  ?  ?  ?  ?  ?  ?Mobility Comments: prior to initial fall in March pt was indep. Since PRIF, walks with  RW ?ADLs Comments: prior to initial fall, she was indep. generally mod I, has difficulty with LLE sometimes ?  ? ?  ?  ?OT Problem List: Decreased strength;Decreased range of motion;Decreased activity tolerance;Impaired balance (sitting and/or standing);Decreased safety awareness;Decreased knowledge of use of DME or AE;Decreased knowledge of precautions;Pain ?  ?   ?OT Treatment/Interventions: Self-care/ADL training;Therapeutic exercise;Balance training;Patient/family education;Therapeutic activities;DME and/or AE instruction  ?  ?OT Goals(Current goals can be found in the care plan section) Acute Rehab OT Goals ?Patient Stated Goal: les pain ?OT Goal Formulation: With patient ?Time For Goal Achievement: 07/06/21 ?Potential to Achieve Goals: Good ?ADL Goals ?Pt Will Perform Lower Body Dressing: with min guard assist;with adaptive equipment;sit to/from stand ?Pt Will Transfer to Toilet: with supervision;ambulating ?Pt Will Perform Toileting - Clothing Manipulation and hygiene: with supervision;sitting/lateral leans ?Additional ADL Goal #1: Pt will indep complete bed mobility with log roll to adhere to back precautions  ?OT Frequency: Min 2X/week ?  ? ?Co-evaluation   ?  ?  ?  ?  ? ?  ?AM-PAC OT "6 Clicks" Daily Activity     ?Outcome Measure Help from another person eating meals?: None ?Help from another person  taking care of personal grooming?: A Little ?Help from another person toileting, which includes using toliet, bedpan, or urinal?: A Little ?Help from another person bathing (including washing, rinsing, drying)?: A Lot ?Help from another person to put on and taking off regular upper body clothing?: A Little ?Help from another person to put on and taking off regular lower body clothing?: A Lot ?6 Click Score: 17 ?  ?End of Session Equipment Utilized During Treatment: Gait belt;Rolling walker (2 wheels) ?Nurse Communication: Mobility status ? ?Activity Tolerance: Patient tolerated treatment well ?Patient left:    ? ?OT Visit Diagnosis: Unsteadiness on feet (R26.81);Muscle weakness (generalized) (M62.81);History of falling (Z91.81);Other abnormalities of gait and mobility (R26.89);Pain  ?              ?Time: 1450-1520 ?O

## 2021-06-22 NOTE — Care Management Obs Status (Signed)
MEDICARE OBSERVATION STATUS NOTIFICATION ? ? ?Patient Details  ?Name: Erin Ochoa ?MRN: [id number] ?Date of Birth: 08-12-1940 ? ? ?Medicare Observation Status Notification Given:  Yes ? ? ? ?Bess Kinds, RN ?06/22/2021, 3:53 PM ?

## 2021-06-22 NOTE — TOC Initial Note (Signed)
Transition of Care (TOC) - Initial/Assessment Note  ? ? ?Patient Details  ?Name: Erin Ochoa ?MRN: [id number] ?Date of Birth: March 09, 1940 ? ?Transition of Care (TOC) CM/SW Contact:    ?Bartholomew Crews, RN ?Phone Number: [phone] ?06/22/2021, 4:23 PM ? ?Clinical Narrative:                 ? ?Spoke with patient at the bedside discussing post acute transition. PTA patient stated that she is from [facility] IDL. She stated in March that she fell and was hospitalized with femur fx and went to SNF for 20 days then to ALF and had just returned to her IDL the day before falling ending in lumbar compression fx. Patient stated that she is widowed. Has a walker at home. She will need ambulance transport at time of discharge. TOC following for transition needs.  ? ?Expected Discharge Plan: [place] ?  ? ? ?Patient Goals and CMS Choice ?  ?CMS Medicare.gov Compare Post Acute Care list provided to:: Patient ?  ? ?Expected Discharge Plan and Services ?Expected Discharge Plan: [place] ?In-house Referral: Clinical Social Work ?Discharge Planning Services: CM Consult ?  ?Living arrangements for the past 2 months: [place] ?                ?  ?  ?  ?  ?  ?  ?  ?  ?  ?  ? ?Prior Living Arrangements/Services ?Living arrangements for the past 2 months: [place] ?Lives with:: Self ?Patient language and need for interpreter reviewed:: Yes ?       ?Need for Family Participation in Patient Care: Yes (Comment) ?Care giver support system in place?: Yes (comment) ?Current home services: DME (walker) ?Criminal Activity/Legal Involvement Pertinent to Current Situation/Hospitalization: No - Comment as needed ? ?Activities of Daily Living ?  ?  ? ?Permission Sought/Granted ?  ?  ?   ?   ?   ?   ? ?Emotional Assessment ?Appearance:: Appears stated age ?Attitude/Demeanor/Rapport: Engaged ?Affect (typically observed): Accepting ?Orientation: : Oriented to Self, Oriented to  Place, Oriented to  Time, Oriented to Situation ?Alcohol / Substance Use: Not Applicable ?Psych Involvement: No (comment) ? ?Admission diagnosis:  Lumbar compression fracture ([place]) [S32.000A] ?Compression fracture of body of thoracic vertebra ([place]) [S22.000A] ?Fall [W19.XXXA] ?Patient Active Problem List  ? Diagnosis Date Noted  ? Fall 06/22/2021  ? Lumbar compression fracture ([place]) 06/21/2021  ? Thoracic compression fracture ([place]) 06/21/2021  ? History of epilepsy 06/21/2021  ? Vertigo 06/21/2021  ? Blood loss anemia 05/14/2021  ? Left hip pain 05/09/2021  ? Thyroid nodule 05/09/2021  ? Left-sided chest wall pain 05/09/2021  ? Macular degeneration 04/11/2021  ? Dysuria 11/20/2020  ? Dry skin dermatitis 11/20/2020  ? Hyperlipidemia 03/10/2019  ? Weight loss 07/29/2018  ? Abnormal CT scan, pelvis 06/17/2018  ? Erosive gastropathy 03/25/2018  ? Renal cyst, right 01/13/2018  ? Liver cyst 01/13/2018  ? Anxiety 06/04/2017  ? High risk medications (not anticoagulants) long-term use 06/04/2017  ? Pre-diabetes 02/03/2017  ? Cataract 10/30/2016  ? H/O colonoscopy 10/30/2016  ? Osteoporosis 10/30/2016  ? Constipation 10/02/2016  ? Rosacea 01/24/2016  ? Gastroesophageal reflux disease without esophagitis 01/24/2016  ? Epilepsy without status epilepticus, not intractable ([place]) 08/24/2014  ? Insomnia 08/24/2014  ? ?PCP:  Mast, Man X, NP ?Pharmacy:   ?[facility] ?[place] [address] ?Phone: [phone] Fax: [phone] ? ?[facility] ?[address] ?[place] [address] ?Phone: [phone] Fax: [phone] ? ? ? ? ?Social Determinants of Health (SDOH) Interventions ?  ? ?Readmission Risk Interventions ?   ? View : No data to display.  ?  ?  ?  ? ? ? ?

## 2021-06-22 NOTE — Progress Notes (Signed)
?  Transition of Care (TOC) Screening Note ? ? ?Patient Details  ?Name: Erin Ochoa ?Date of Birth: 1940/12/06 ? ? ?Transition of Care (TOC) CM/SW Contact:    ?Alfredia Ferguson, LCSW ?Phone Number: ?06/22/2021, 8:14 AM ? ? ? ?Transition of Care Department [facility]) has reviewed patient and noted no immediate TOC needs pending continued medical work-up. TOC team will continue to monitor patient advancement through interdisciplinary progression rounds to support any identified discharge supports as needed. If new patient transition needs arise, please place a TOC consult or reach out to [facility] team.  ? ?Patient is from [facility], anticipate return to facility or their SNF pending medical progress.  ?  ?

## 2021-06-23 ENCOUNTER — Inpatient Hospital Stay (HOSPITAL_COMMUNITY): Payer: Medicare Other

## 2021-06-23 DIAGNOSIS — Z8669 Personal history of other diseases of the nervous system and sense organs: Secondary | ICD-10-CM | POA: Diagnosis not present

## 2021-06-23 DIAGNOSIS — S22000A Wedge compression fracture of unspecified thoracic vertebra, initial encounter for closed fracture: Secondary | ICD-10-CM | POA: Diagnosis not present

## 2021-06-23 DIAGNOSIS — S32010D Wedge compression fracture of first lumbar vertebra, subsequent encounter for fracture with routine healing: Secondary | ICD-10-CM | POA: Diagnosis not present

## 2021-06-23 DIAGNOSIS — E785 Hyperlipidemia, unspecified: Secondary | ICD-10-CM | POA: Diagnosis not present

## 2021-06-23 MED ORDER — GLUCERNA SHAKE PO LIQD
237.0000 mL | Freq: Three times a day (TID) | ORAL | Status: DC
  Administered 2021-06-23 – 2021-06-28 (×12): 237 mL via ORAL
  Filled 2021-06-23 (×4): qty 237

## 2021-06-23 NOTE — Evaluation (Addendum)
Physical Therapy Evaluation ?Patient Details ?Name: Erin Ochoa ?MRN: [id number] ?DOB: February 23, 1940 ?Today's Date: 06/23/2021 ? ?History of Present Illness ? Erin Ochoa is a 81 y.o. female who presented 06/21/2021 s/p fall in her kitchen wtih immediate back pain. Imaging revealed  mild T8 and mild-moderate L1 compression fracture. Pt with medical history significant of osteoporosis, epilespy, HLD, recent left hip fracture s/p ORIF (3/23)  ?Clinical Impression ? Pt presents to PT with deficits in activity tolerance, power, balance, strength, functional mobility, and gait. Pt is largely limited by back pain but also by intermittent reports of mild lightheadedness. PT recorded orthostatics, documented in flowsheet at 1132AM, which were positive from sitting to standing, but recovered after 3 min standing. Pt with increased back pain with turns when ambulating. PT will benefit from aggressive mobilization in an effort to reduce pain and restore independence. Pt would likely benefit from a transition back to ALF to allow for increased assistance in the home while receiving HHPT. If this is not possible then pt's progress in the acute setting will determine need for short term rehab stay vs return to independent living with HHPT.   ? ?Vestibular PT order placed during PT evaluation. Pt's symptoms do not appear consistent with typical vestibular dysfunction, however due to pt's reported history of vertigo PT will assess vestibular function more thoroughly next session. ?   ? ?Recommendations for follow up therapy are one component of a multi-disciplinary discharge planning process, led by the attending physician.  Recommendations may be updated based on patient status, additional functional criteria and insurance authorization. ? ?Follow Up Recommendations Home health PT (may benefit from initial transition to ALF for increased support) ? ?  ?Assistance Recommended at Discharge Intermittent Supervision/Assistance  ?Patient  can return home with the following ? A little help with walking and/or transfers;A little help with bathing/dressing/bathroom;Assistance with cooking/housework;Assist for transportation;Help with stairs or ramp for entrance ? ?  ?Equipment Recommendations None recommended by PT  ?Recommendations for Other Services ?    ?  ?Functional Status Assessment Patient has had a recent decline in their functional status and demonstrates the ability to make significant improvements in function in a reasonable and predictable amount of time.  ? ?  ?Precautions / Restrictions Precautions ?Precautions: Fall;Back ?Precaution Booklet Issued: No ?Precaution Comments: verbally reviewed ?Restrictions ?Weight Bearing Restrictions: No  ? ?  ? ?Mobility ? Bed Mobility ?Overal bed mobility: Needs Assistance ?Bed Mobility: Supine to Sit ?  ?  ?Supine to sit: Supervision, HOB elevated ?  ?  ?General bed mobility comments: increased time ?  ? ?Transfers ?Overall transfer level: Needs assistance ?Equipment used: Rolling walker (2 wheels) ?Transfers: Sit to/from Stand ?Sit to Stand: Min guard ?  ?  ?  ?  ?  ?  ?  ? ?Ambulation/Gait ?Ambulation/Gait assistance: Min guard ?Gait Distance (Feet): 60 Feet ?Assistive device: Rolling walker (2 wheels) ?Gait Pattern/deviations: Step-through pattern ?Gait velocity: reduced ?Gait velocity interpretation: <1.31 ft/sec, indicative of household ambulator ?  ?General Gait Details: pt with slowed step-through gait, reports pain with changes of direction ? ?Stairs ?  ?  ?  ?  ?  ? ?Wheelchair Mobility ?  ? ?Modified Rankin (Stroke Patients Only) ?  ? ?  ? ?Balance Overall balance assessment: Needs assistance ?Sitting-balance support: No upper extremity supported, Feet supported ?Sitting balance-Leahy Scale: Fair ?  ?  ?Standing balance support: Single extremity supported, Bilateral upper extremity supported, Reliant on assistive device for balance ?Standing balance-Leahy Scale: Poor ?  ?  ?  ?  ?  ?  ?  ?  ?   ?  ?  ?  ?   ? ? ? ?  Pertinent Vitals/Pain Pain Assessment ?Pain Assessment: 0-10 ?Pain Score: 6  ?Pain Location: back ?Pain Descriptors / Indicators: Aching ?Pain Intervention(s): Premedicated before session  ? ? ?Home Living Family/patient expects to be discharged to:: Other (Comment) (Independent Living) ?  ?  ?  ?  ?  ?  ?  ?  ?Home Equipment: Agricultural consultantolling Walker (2 wheels);Shower seat;Grab bars - tub/shower ?Additional Comments: Pt from [facility] ILF - s/p her LLE ORIF she d/c'd to the facilities SNF, then transitioned to the ALF, and spent one day home at the ILF. ILF: level entry, elevator access, walk in shower with seat and grab bars, standard toilet. DME: RW  ?  ?Prior Function Prior Level of Function : Independent/Modified Independent;Needs assist ?  ?  ?  ?  ?  ?  ?Mobility Comments: prior to initial fall in March pt was indep. Since ORIF, walks with RW ?ADLs Comments: prior to initial fall, she was indep. generally mod I, has difficulty with LLE sometimes ?  ? ? ?Hand Dominance  ? Dominant Hand: Right ? ?  ?Extremity/Trunk Assessment  ? Upper Extremity Assessment ?Upper Extremity Assessment: Overall WFL for tasks assessed ?  ? ?Lower Extremity Assessment ?Lower Extremity Assessment: Generalized weakness ?  ? ?Cervical / Trunk Assessment ?Cervical / Trunk Assessment: Other exceptions ?Cervical / Trunk Exceptions: compression fxs  ?Communication  ? Communication: No difficulties  ?Cognition Arousal/Alertness: Awake/alert ?Behavior During Therapy: Anxious ?Overall Cognitive Status: Within Functional Limits for tasks assessed ?  ?  ?  ?  ?  ?  ?  ?  ?  ?  ?  ?  ?  ?  ?  ?  ?  ?  ?  ? ?  ?General Comments General comments (skin integrity, edema, etc.): orthostatics documented in flowsheet, positive from sitting to standing, recover after standing 3 minutes. Pt reports feeling lightheaded intermittently during session, reports this happens sometimes when she hasn't slept well. Pt also reports a history  of vertigo once in the past. Pt's description of symptoms do no align with typical vertigo, however may be worth further assessment next session ? ?  ?Exercises    ? ?Assessment/Plan  ?  ?PT Assessment Patient needs continued PT services  ?PT Problem List Decreased strength;Decreased activity tolerance;Decreased balance;Decreased mobility;Decreased knowledge of precautions;Pain ? ?   ?  ?PT Treatment Interventions DME instruction;Gait training;Stair training;Functional mobility training;Therapeutic activities;Therapeutic exercise;Balance training;Neuromuscular re-education;Patient/family education   ? ?PT Goals (Current goals can be found in the Care Plan section)  ?Acute Rehab PT Goals ?Patient Stated Goal: to reduce back pain ?PT Goal Formulation: With patient ?Time For Goal Achievement: 07/07/21 ?Potential to Achieve Goals: Good ? ?  ?Frequency Min 3X/week ?  ? ? ?Co-evaluation   ?  ?  ?  ?  ? ? ?  ?AM-PAC PT "6 Clicks" Mobility  ?Outcome Measure Help needed turning from your back to your side while in a flat bed without using bedrails?: A Little ?Help needed moving from lying on your back to sitting on the side of a flat bed without using bedrails?: A Little ?Help needed moving to and from a bed to a chair (including a wheelchair)?: A Little ?Help needed standing up from a chair using your arms (e.g., wheelchair or bedside chair)?: A Little ?Help needed to walk in hospital room?: A Little ?Help needed climbing 3-5 steps with a railing? : Total ?6 Click Score: 16 ? ?  ?End of Session   ?Activity Tolerance: Patient tolerated  treatment well ?Patient left: in chair;with call bell/phone within reach;with chair alarm set ?Nurse Communication: Mobility status ?PT Visit Diagnosis: Other abnormalities of gait and mobility (R26.89);Muscle weakness (generalized) (M62.81);Pain ?Pain - part of body:  (back) ?  ? ?Time: 8242-3536 ?PT Time Calculation (min) (ACUTE ONLY): 30 min ? ? ?Charges:   PT Evaluation ?$PT Eval Low  Complexity: 1 Low ?  ?  ?   ? ? ?Arlyss Gandy, PT, DPT ?Acute Rehabilitation ?Pager: [phone] ?Office [phone] ? ? ?Arlyss Gandy ?06/23/2021, 12:55 PM ? ?

## 2021-06-23 NOTE — Progress Notes (Signed)
? ? ? [facility] ?                                                                            ? ? ?Erin Ochoa, is a 81 y.o. female, DOB - [birth year], ZOX:[id number] ?Admit date - 06/21/2021    ?Outpatient Primary MD for the patient is Mast, Man X, NP ? ?LOS - 1  days ? ? ? ?Brief summary  ? ?Erin Ochoa is a 81 y.o. female with medical history significant of osteoporosis, epilespy, HLD, recent left hip fracture s/p ORIF presents for a fall. ? ?In the ED, she was afebrile, normotensive on rrom air. CT head negative. L-spine and CXR showed mild T8 and mild-moderate L1 compression fracture. CBC, BMP unremarkable. Admission requested for pain control.  ?  ? ?Assessment & Plan  ? ? ?Assessment and Plan: ?* Lumbar compression fracture ([name]) ?Thoracic compression fracture ?T8 and L1  mild fractures s/p fall ?Pain control and therapy evaluation .  ?IR consult for kyphoplasty.  ?MRI of the lumbar and thoracic spine ordered for further evaluation.  ? ? ? ?Vertigo/ Dizziness.  ?Unclear etiology at this time and unable to test for BPPV on exam since patient has severe back pain.  Could be due to increased her Klonopin but reports that this has been gradual over the past several months. ?Meclizine PRN for dizziness ?Will need Vestibular evaluation when she is able to tolerate pain and work with PT.  ?Check orthostatics.  ?Will get carotid duplex.  ?No arrhythmias on telemetry.  ?No syncope .  ? ?History of epilepsy ?Continue Tegretol 300 mg p.o. at bedtime- require brand-name only for seizure control. ?-Follow-up with neurology outpatient ? ?Hyperlipidemia ?Continue statin ? ?Anxiety ?Recommend outpatient follow up with psychiatry . Continue with klonopin.  ? ?Osteoporosis ?Continue Caltrate ?Recommend follow with PCP to follow DEXA and to consider Bisphonate given repeat falls and fractures ? ? ? ?Hyponatremia:  ?- improved with IV fluids.  ?- continue to monitor.  ?Recheck sodium in am.  ? ?  ? ?Estimated body  mass index is 16.5 kg/m? as calculated from the following: ?  Height as of this encounter: 5\' 7"  (1.702 m). ?  Weight as of this encounter: 47.8 kg. ? ?Code Status: full code.  ?DVT Prophylaxis:  enoxaparin (LOVENOX) injection 30 mg Start: 06/21/21 2145 ? ? ?Level of Care: Level of care: Telemetry Medical ?Family Communication: none at bedside.  ? ?Disposition Plan:     Remains inpatient appropriate:  IR consultation for kyphoplasty and therapy evaluation.  ? ?Procedures:  ?None.  ?Consultants:   ?None.  ? ?Antimicrobials:  ? ?Anti-infectives (From admission, onward)  ? ? None  ? ?  ? ? ? ?Medications ? ?Scheduled Meds: ? atorvastatin  5 mg Oral Daily  ? carbamazepine  300 mg Oral QHS  ? clonazePAM  1 mg Oral QHS  ? enoxaparin (LOVENOX) injection  30 mg Subcutaneous Q24H  ? feeding supplement (GLUCERNA SHAKE)  237 mL Oral TID BM  ? lidocaine  1 patch Transdermal Q24H  ? polyethylene glycol  17 g Oral Daily  ? ?Continuous Infusions: ?PRN Meds:.acetaminophen, ketorolac, meclizine, methocarbamol, morphine injection, ondansetron (ZOFRAN) IV, polyvinyl alcohol ? ? ? ?  Subjective:  ? ?Erin Ochoa was seen and examined today.  Painful ROM of the back.  ?No chest pain or sob.  ? ?Objective:  ? ?Vitals:  ? 06/22/21 2304 06/23/21 0258 06/23/21 0754 06/23/21 1054  ?BP: 136/64 121/61 132/67 133/64  ?Pulse: 77 70 77 75  ?Resp: 17 11 17 20   ?Temp: 98.1 ?F (36.7 ?C) 97.7 ?F (36.5 ?C) 98.1 ?F (36.7 ?C) 97.7 ?F (36.5 ?C)  ?TempSrc: Oral Oral Oral Oral  ?SpO2: 93% 97% 97% 99%  ?Weight:      ?Height:      ? ? ?Intake/Output Summary (Last 24 hours) at 06/23/2021 1208 ?Last data filed at 06/23/2021 0900 ?Gross per 24 hour  ?Intake 480 ml  ?Output 1850 ml  ?Net -1370 ml  ? ? ?Filed Weights  ? 06/21/21 1126  ?Weight: 47.8 kg  ? ? ? ?General exam: Appears calm and comfortable  ?Respiratory system: Clear to auscultation. Respiratory effort normal. ?Cardiovascular system: S1 & S2 heard, RRR.  No pedal edema. ?Gastrointestinal system: Abdomen is  nondistended, soft and nontender.  Normal bowel sounds heard. ?Central nervous system: Alert and oriented. No focal neurological deficits. ?Extremities: Symmetric 5 x 5 power. ?Skin: No rashes, lesions or ulcers ?Psychiatry: Mood & affect appropriate.  ? ? ? ?Data Reviewed:  I have personally reviewed following labs and imaging studies ? ? ?CBC ?Lab Results  ?Component Value Date  ? WBC 10.0 06/21/2021  ? RBC 4.08 06/21/2021  ? HGB 13.0 06/21/2021  ? HCT 39.7 06/21/2021  ? MCV 97.3 06/21/2021  ? MCH 31.9 06/21/2021  ? PLT 215 06/21/2021  ? MCHC 32.7 06/21/2021  ? RDW 13.2 06/21/2021  ? LYMPHSABS 0.7 06/21/2021  ? MONOABS 0.9 06/21/2021  ? EOSABS 0.0 06/21/2021  ? BASOSABS 0.0 06/21/2021  ? ? ? ?Last metabolic panel ?Lab Results  ?Component Value Date  ? NA 133 (L) 06/22/2021  ? K 3.7 06/22/2021  ? CL 100 06/22/2021  ? CO2 28 06/22/2021  ? BUN 9 06/22/2021  ? CREATININE 0.52 06/22/2021  ? GLUCOSE 155 (H) 06/22/2021  ? GFRNONAA >60 06/22/2021  ? GFRAA 99 08/15/2020  ? CALCIUM 8.7 (L) 06/22/2021  ? PROT 6.6 06/21/2021  ? ALBUMIN 4.3 06/21/2021  ? BILITOT 0.5 06/21/2021  ? ALKPHOS 99 06/21/2021  ? AST 30 06/21/2021  ? ALT 32 06/21/2021  ? ANIONGAP 5 06/22/2021  ? ? ?CBG (last 3)  ?No results for input(s): GLUCAP in the last 72 hours.  ? ? ?Coagulation Profile: ?No results for input(s): INR, PROTIME in the last 168 hours. ? ? ?Radiology Studies: ?DG Chest 2 View ? ?Result Date: 06/21/2021 ?CLINICAL DATA:  Lower back pain. EXAM: CHEST - 2 VIEW COMPARISON:  May 09, 2021. FINDINGS: The heart size and mediastinal contours are within normal limits. Both lungs are clear. Mild compression deformity is seen involving the T8 and L1 vertebral bodies concerning for fractures of indeterminate age. IMPRESSION: No acute cardiopulmonary abnormality seen. Mild compression deformity is seen involving the T8 and L1 vertebral bodies concerning for fractures of indeterminate age. Electronically Signed   By: May 11, 2021 M.D.   On:  06/21/2021 12:21  ? ?DG Thoracic Spine 2 View ? ?Result Date: 06/21/2021 ?CLINICAL DATA:  Upper back pain after fall. EXAM: THORACIC SPINE 2 VIEWS COMPARISON:  None Available. FINDINGS: Mild compression deformity of T8 vertebral body is noted concerning for fracture of indeterminate age. No spondylolisthesis is noted. Disc spaces are well-maintained. IMPRESSION: Mild compression deformity of T8 vertebral body  is noted concerning for fracture of indeterminate age. MRI may be performed for further evaluation. Electronically Signed   By: Lupita Raider M.D.   On: 06/21/2021 12:23  ? ?DG Lumbar Spine Complete ? ?Result Date: 06/21/2021 ?CLINICAL DATA:  Lower back pain after fall. EXAM: LUMBAR SPINE - COMPLETE 4+ VIEW COMPARISON:  CT of April 29, 2018. FINDINGS: There is now noted mild to moderate compression deformity of L1 vertebral body concerning for fracture of indeterminate age. No spondylolisthesis is noted. Mild degenerative disc disease is noted at L2-3. Osteopenia is noted. IMPRESSION: Mild to moderate compression deformity of L1 vertebral body concerning for fracture of indeterminate age. MRI may be performed for further evaluation. Electronically Signed   By: Lupita Raider M.D.   On: 06/21/2021 12:22  ? ?CT Head Wo Contrast ? ?Result Date: 06/21/2021 ?CLINICAL DATA:  Dizziness EXAM: CT HEAD WITHOUT CONTRAST TECHNIQUE: Contiguous axial images were obtained from the base of the skull through the vertex without intravenous contrast. RADIATION DOSE REDUCTION: This exam was performed according to the departmental dose-optimization program which includes automated exposure control, adjustment of the mA and/or kV according to patient size and/or use of iterative reconstruction technique. COMPARISON:  None Available. FINDINGS: Brain: Chronic white matter ischemic change. Age related global atrophy. No evidence of acute infarction, hemorrhage, hydrocephalus, extra-axial collection or mass lesion/mass effect. Vascular: No  hyperdense vessel or unexpected calcification. Skull: Normal. Negative for fracture or focal lesion. Sinuses/Orbits: No acute finding. Other: None. IMPRESSION: No acute intracranial abnormality. Electronical

## 2021-06-24 ENCOUNTER — Inpatient Hospital Stay (HOSPITAL_COMMUNITY): Payer: Medicare Other

## 2021-06-24 DIAGNOSIS — R42 Dizziness and giddiness: Secondary | ICD-10-CM | POA: Diagnosis not present

## 2021-06-24 DIAGNOSIS — S22000A Wedge compression fracture of unspecified thoracic vertebra, initial encounter for closed fracture: Secondary | ICD-10-CM | POA: Diagnosis not present

## 2021-06-24 DIAGNOSIS — S32010D Wedge compression fracture of first lumbar vertebra, subsequent encounter for fracture with routine healing: Secondary | ICD-10-CM | POA: Diagnosis not present

## 2021-06-24 MED ORDER — MAGNESIUM HYDROXIDE 400 MG/5ML PO SUSP
30.0000 mL | Freq: Once | ORAL | Status: AC
  Administered 2021-06-24: 30 mL via ORAL
  Filled 2021-06-24: qty 30

## 2021-06-24 NOTE — TOC Progression Note (Signed)
Transition of Care (TOC) - Progression Note  ? ? ?Patient Details  ?Name: Erin Ochoa ?MRN: [id number] ?Date of Birth: [birth year] ? ?Transition of Care (TOC) CM/SW Contact  ?Eduard Roux, LCSW ?Phone Number: ?06/24/2021, 11:43 AM ? ?Clinical Narrative:    ? ?CSW informed [facility] once medially stable, per PT recommendation;patient will need to return to ALF and transition back to [place].  ? ? ?Expected Discharge Plan: Home w Home Health Services ?  ? ?Expected Discharge Plan and Services ?Expected Discharge Plan: Home w Home Health Services ?In-house Referral: Clinical Social Work ?Discharge Planning Services: CM Consult ?  ?Living arrangements for the past 2 months: Independent Living Facility ?                ?  ?  ?  ?  ?  ?  ?  ?  ?  ?  ? ? ?Social Determinants of Health (SDOH) Interventions ?  ? ?Readmission Risk Interventions ?   ? View : No data to display.  ?  ?  ?  ? ? ?

## 2021-06-24 NOTE — Plan of Care (Signed)
On-going back pain. On-going dizziness upon standing/moving. High fall risk. Fall prevention measures in place. ? ? ?Problem: Education: ?Goal: Knowledge of General Education information will improve ?Description: Including pain rating scale, medication(s)/side effects and non-pharmacologic comfort measures ?Outcome: Progressing ?  ?Problem: Health Behavior/Discharge Planning: ?Goal: Ability to manage health-related needs will improve ?Outcome: Progressing ?  ?Problem: Clinical Measurements: ?Goal: Ability to maintain clinical measurements within normal limits will improve ?Outcome: Progressing ?Goal: Will remain free from infection ?Outcome: Progressing ?Goal: Diagnostic test results will improve ?Outcome: Progressing ?Goal: Respiratory complications will improve ?Outcome: Progressing ?Goal: Cardiovascular complication will be avoided ?Outcome: Progressing ?  ?Problem: Activity: ?Goal: Risk for activity intolerance will decrease ?Outcome: Progressing ?  ?Problem: Nutrition: ?Goal: Adequate nutrition will be maintained ?Outcome: Progressing ?  ?Problem: Coping: ?Goal: Level of anxiety will decrease ?Outcome: Progressing ?  ?Problem: Elimination: ?Goal: Will not experience complications related to bowel motility ?Outcome: Progressing ?Goal: Will not experience complications related to urinary retention ?Outcome: Progressing ?  ?Problem: Pain Managment: ?Goal: General experience of comfort will improve ?Outcome: Progressing ?  ?Problem: Safety: ?Goal: Ability to remain free from injury will improve ?Outcome: Progressing ?  ?Problem: Skin Integrity: ?Goal: Risk for impaired skin integrity will decrease ?Outcome: Progressing ?  ?

## 2021-06-24 NOTE — Progress Notes (Signed)
? ? ? [facility] ?                                                                            ? ? ?Erin ChickBennie Ochoa, is a 81 y.o. female, DOB - [birth year], MVH:[id number] ?Admit date - 06/21/2021    ?Outpatient Primary MD for the patient is Mast, Man X, NP ? ?LOS - 2  days ? ? ? ?Brief summary  ? ?Erin Ochoa is a 81 y.o. female with medical history significant of osteoporosis, epilespy, HLD, recent left hip fracture s/p ORIF presents for a fall. ? ?In the ED, she was afebrile, normotensive on rrom air. CT head negative. L-spine and CXR showed mild T8 and mild-moderate L1 compression fracture. CBC, BMP unremarkable. Admission requested for pain control.  ?  ? ?Assessment & Plan  ? ? ?Assessment and Plan: ?* Lumbar compression fracture ([name]) ?Thoracic compression fracture ?T8 and L1  mild fractures s/p fall ?Pain control and therapy evaluation .  ?IR consult for kyphoplasty.  ?MRI of the lumbar and thoracic spine ordered for further evaluation showed Acute T8 and L1 compression fractures with mild height loss. Edema at T8 extends into the pedicles.  ?Small left subarticular disc protrusion at L5-S1 narrowing the  left lateral recess and mildly displacing the left S1 nerve root. ?Awaiting IR recommendations.  ? ? ? ?Vertigo/ Dizziness.  ?Unclear etiology at this time and unable to test for BPPV on exam since patient has severe back pain.  Could be due to increased her Klonopin but reports that this has been gradual over the past several months. ?Meclizine PRN for dizziness ?Will need Vestibular evaluation when she is able to tolerate pain and work with PT.  ?Orthostatic vital signs slightly positive.  ?Carotid duplex is not significant.  ?No arrhythmias on telemetry.  ?No syncope .  ? ?History of epilepsy ?Continue Tegretol 300 mg p.o. at bedtime- require brand-name only for seizure control. ?-Follow-up with neurology outpatient ? ?Hyperlipidemia ?Continue statin ? ?Anxiety ?Recommend outpatient follow up with  psychiatry . Continue with klonopin.  ? ?Osteoporosis ?Continue Caltrate ?Recommend follow with PCP to follow DEXA and to consider Bisphonate given repeat falls and fractures ? ? ? ?Hyponatremia:  ?- improved with IV fluids.  ? Sodium is 133.  ? ?  ? ?Estimated body mass index is 16.5 kg/m? as calculated from the following: ?  Height as of this encounter: 5\' 7"  (1.702 m). ?  Weight as of this encounter: 47.8 kg. ? ?Code Status: full code.  ?DVT Prophylaxis:  Place TED hose Start: 06/24/21 0943 ?enoxaparin (LOVENOX) injection 30 mg Start: 06/21/21 2145 ? ? ?Level of Care: Level of care: Telemetry Medical ?Family Communication: none at bedside.  ? ?Disposition Plan:     Remains inpatient appropriate:  IR consultation for kyphoplasty and therapy evaluation.  ? ?Procedures:  ?None.  ?Consultants:   ?None.  ? ?Antimicrobials:  ? ?Anti-infectives (From admission, onward)  ? ? None  ? ?  ? ? ? ?Medications ? ?Scheduled Meds: ? atorvastatin  5 mg Oral Daily  ? carbamazepine  300 mg Oral QHS  ? clonazePAM  1 mg Oral QHS  ? enoxaparin (LOVENOX) injection  30 mg Subcutaneous Q24H  ?  feeding supplement (GLUCERNA SHAKE)  237 mL Oral TID BM  ? lidocaine  1 patch Transdermal Q24H  ? magnesium hydroxide  30 mL Oral Once  ? polyethylene glycol  17 g Oral Daily  ? ?Continuous Infusions: ?PRN Meds:.acetaminophen, ketorolac, meclizine, methocarbamol, morphine injection, ondansetron (ZOFRAN) IV, polyvinyl alcohol ? ? ? ?Subjective:  ? ?Erin Ochoa was seen and examined today.  Persistent pain in the back on movement.  ? ?Objective:  ? ?Vitals:  ? 06/23/21 1617 06/23/21 2317 06/24/21 0306 06/24/21 1638  ?BP: 132/63 137/73 (!) 106/49 132/62  ?Pulse: 81   83  ?Resp: 19 15 17 18   ?Temp: 98.4 ?F (36.9 ?C) 98.3 ?F (36.8 ?C)  98 ?F (36.7 ?C)  ?TempSrc: Oral Oral    ?SpO2: 98%   98%  ?Weight:      ?Height:      ? ? ?Intake/Output Summary (Last 24 hours) at 06/24/2021 1034 ?Last data filed at 06/23/2021 2317 ?Gross per 24 hour  ?Intake 240 ml   ?Output 450 ml  ?Net -210 ml  ? ? ?Filed Weights  ? 06/21/21 1126  ?Weight: 47.8 kg  ? ? ? ?General exam: Appears calm and comfortable  ?Respiratory system: Clear to auscultation. Respiratory effort normal. ?Cardiovascular system: S1 & S2 heard, RRR. No JVD, murmurs, rubs, gallops or clicks. No pedal edema. ?Gastrointestinal system: Abdomen is nondistended, soft and nontender. No organomegaly or masses felt. Normal bowel sounds heard. ?Central nervous system: Alert and oriented. No focal neurological deficits. ?Extremities: Symmetric 5 x 5 power. ?Skin: No rashes, lesions or ulcers ?Psychiatry: Judgement and insight appear normal. Mood & affect appropriate.  ? ? ? ? ?Data Reviewed:  I have personally reviewed following labs and imaging studies ? ? ?CBC ?Lab Results  ?Component Value Date  ? WBC 10.0 06/21/2021  ? RBC 4.08 06/21/2021  ? HGB 13.0 06/21/2021  ? HCT 39.7 06/21/2021  ? MCV 97.3 06/21/2021  ? MCH 31.9 06/21/2021  ? PLT 215 06/21/2021  ? MCHC 32.7 06/21/2021  ? RDW 13.2 06/21/2021  ? LYMPHSABS 0.7 06/21/2021  ? MONOABS 0.9 06/21/2021  ? EOSABS 0.0 06/21/2021  ? BASOSABS 0.0 06/21/2021  ? ? ? ?Last metabolic panel ?Lab Results  ?Component Value Date  ? NA 133 (L) 06/22/2021  ? K 3.7 06/22/2021  ? CL 100 06/22/2021  ? CO2 28 06/22/2021  ? BUN 9 06/22/2021  ? CREATININE 0.52 06/22/2021  ? GLUCOSE 155 (H) 06/22/2021  ? GFRNONAA >60 06/22/2021  ? GFRAA 99 08/15/2020  ? CALCIUM 8.7 (L) 06/22/2021  ? PROT 6.6 06/21/2021  ? ALBUMIN 4.3 06/21/2021  ? BILITOT 0.5 06/21/2021  ? ALKPHOS 99 06/21/2021  ? AST 30 06/21/2021  ? ALT 32 06/21/2021  ? ANIONGAP 5 06/22/2021  ? ? ?CBG (last 3)  ?No results for input(s): GLUCAP in the last 72 hours.  ? ? ?Coagulation Profile: ?No results for input(s): INR, PROTIME in the last 168 hours. ? ? ?Radiology Studies: ?MR THORACIC SPINE WO CONTRAST ? ?Result Date: 06/23/2021 ?CLINICAL DATA:  Fall EXAM: MRI THORACIC AND LUMBAR SPINE WITHOUT CONTRAST TECHNIQUE: Multiplanar and multiecho  pulse sequences of the thoracic and lumbar spine were obtained without intravenous contrast. COMPARISON:  None Available. FINDINGS: MRI THORACIC SPINE FINDINGS Alignment:  Physiologic. Vertebrae: There is a T8 compression fracture with mild height loss and edema extending into both pedicles. No retropulsion. Cord:  Normal Paraspinal and other soft tissues: Small pleural effusions Disc levels: No spinal canal stenosis.  Small disc protrusion at  T9-10. MRI LUMBAR SPINE FINDINGS Segmentation:  Standard. Alignment:  Physiologic. Vertebrae: L1 compression fracture with mild height loss and edema within the top half of the vertebral body. No retropulsion. Conus medullaris and cauda equina: Conus extends to the L1 level. Conus and cauda equina appear normal. Paraspinal and other soft tissues: 4.2 cm right renal cyst for which no follow-up is needed. Disc levels: L1-L2: Small central disc protrusion. No spinal canal stenosis. No neural foraminal stenosis. L2-L3: Normal disc space and facet joints. No spinal canal stenosis. No neural foraminal stenosis. L3-L4: Normal disc space and facet joints. No spinal canal stenosis. No neural foraminal stenosis. L4-L5: Minimal disc bulge. No spinal canal stenosis. No neural foraminal stenosis. L5-S1: Small left subarticular disc protrusion narrowing the left lateral recess and mildly displacing the left S1 nerve root. No central spinal canal stenosis. No neural foraminal stenosis. Visualized sacrum: Normal. IMPRESSION: 1. Acute T8 and L1 compression fractures with mild height loss. Edema at T8 extends into the pedicles. CT would be helpful to better assess the fracture morphology. 2. Small left subarticular disc protrusion at L5-S1 narrowing the left lateral recess and mildly displacing the left S1 nerve root. This may be a source of left S1 radiculopathy. Electronically Signed   By: Deatra Robinson M.D.   On: 06/23/2021 19:38  ? ?MR LUMBAR SPINE WO CONTRAST ? ?Result Date:  06/23/2021 ?CLINICAL DATA:  Fall EXAM: MRI THORACIC AND LUMBAR SPINE WITHOUT CONTRAST TECHNIQUE: Multiplanar and multiecho pulse sequences of the thoracic and lumbar spine were obtained without intravenous contrast. COMPA

## 2021-06-24 NOTE — Progress Notes (Signed)
Physical Therapy Treatment ?Patient Details ?Name: Erin Ochoa ?MRN: [id number] ?DOB: August 16, 1940 ?Today's Date: 06/24/2021 ? ? ?History of Present Illness Erin Ochoa is a 81 y.o. female who presented 06/21/2021 s/p fall in her kitchen wtih immediate back pain. Imaging revealed  mild T8 and mild-moderate L1 compression fracture. Plan for possible kyphoplasty on 5/9.  Pt with medical history significant of osteoporosis, epilespy, HLD, recent left hip fracture s/p ORIF (3/23) ? ?  ?PT Comments  ? ? Pt admitted with above diagnosis. Difficult vestibular assessment due to back pain limiting ability to assess easily.  Suspicious for right posterior canal BPPV with attempted treatment with Epley maneuver.  MD came in to talk with pt regarding surgery tomorrow therefore cut session a little short.  Pt was left in chair and will check back tomorrow to determine if treatment helped her vertigo.   Pt currently with functional limitations due to balance and endurance deficits. Pt will benefit from skilled PT to increase their independence and safety with mobility to allow discharge to the venue listed below.      ?Recommendations for follow up therapy are one component of a multi-disciplinary discharge planning process, led by the attending physician.  Recommendations may be updated based on patient status, additional functional criteria and insurance authorization. ? ?Follow Up Recommendations ? Home health PT (may benefit from initial transition to ALF for increased support) ?  ?  ?Assistance Recommended at Discharge Intermittent Supervision/Assistance  ?Patient can return home with the following A little help with walking and/or transfers;A little help with bathing/dressing/bathroom;Assistance with cooking/housework;Assist for transportation;Help with stairs or ramp for entrance ?  ?Equipment Recommendations ? None recommended by PT  ?  ?Recommendations for Other Services   ? ? ?  ?Precautions / Restrictions  Precautions ?Precautions: Fall;Back ?Precaution Booklet Issued: No ?Precaution Comments: verbally reviewed ?Restrictions ?Weight Bearing Restrictions: No  ?  ? ?Mobility ? Bed Mobility ?Overal bed mobility: Needs Assistance ?Bed Mobility: Supine to Sit ?Rolling: Min guard ?Sidelying to sit: Min assist ?  ?  ?  ?General bed mobility comments: increased time and assist needed due to pain. ?  ? ?Transfers ?Overall transfer level: Needs assistance ?Equipment used: Rolling walker (2 wheels) ?Transfers: Sit to/from Stand ?Sit to Stand: Min guard ?  ?  ?  ?  ?  ?General transfer comment: with verbal cues ?  ? ?Ambulation/Gait ?Ambulation/Gait assistance: Min guard ?Gait Distance (Feet): 4 Feet ?Assistive device: Rolling walker (2 wheels) ?Gait Pattern/deviations: Step-through pattern ?Gait velocity: reduced ?Gait velocity interpretation: <1.31 ft/sec, indicative of household ambulator ?  ?General Gait Details: pt with slowed step-through gait, reports pain with changes of direction ? ? ?Stairs ?  ?  ?  ?  ?  ? ? ?Wheelchair Mobility ?  ? ?Modified Rankin (Stroke Patients Only) ?  ? ? ?  ?Balance Overall balance assessment: Needs assistance ?Sitting-balance support: No upper extremity supported, Feet supported ?Sitting balance-Leahy Scale: Fair ?  ?  ?Standing balance support: Bilateral upper extremity supported, Reliant on assistive device for balance, During functional activity ?Standing balance-Leahy Scale: Poor ?Standing balance comment: Pt relies on UE support on RW ?  ?  ?  ?  ?  ?  ?  ?  ?  ?  ?  ?  ? ?  ?Cognition Arousal/Alertness: Awake/alert ?Behavior During Therapy: Anxious ?Overall Cognitive Status: Within Functional Limits for tasks assessed ?  ?  ?  ?  ?  ?  ?  ?  ?  ?  ?  ?  ?  ?  ?  ?  ?  General Comments: good recall of precautions after review - tangentail throughout and can lose train of thought ?  ?  ? ?  ?Exercises   ? ?  ?General Comments General comments (skin integrity, edema, etc.): Vestibular  assessment difficult as pt was in pain in back.  However used the bed positions to improvise for [employer]. Left negative however did note some right rotary nystagmus with right Hallpike modifying the bed to accomodate for pts back pain. Used Trendelenberg to get positions needed and did Epley as best able in this way for right posterior canal BPPV. ?  ?  ? ?Pertinent Vitals/Pain Pain Assessment ?Pain Assessment: Faces ?Faces Pain Scale: Hurts whole lot ?Pain Location: back ?Pain Descriptors / Indicators: Aching ?Pain Intervention(s): Limited activity within patient's tolerance, Monitored during session, Repositioned, Patient requesting pain meds-RN notified  ? ? ?Home Living   ?  ?  ?  ?  ?  ?  ?  ?  ?  ?   ?  ?Prior Function    ?  ?  ?   ? ?PT Goals (current goals can now be found in the care plan section) Acute Rehab PT Goals ?Patient Stated Goal: to reduce back pain ?Progress towards PT goals: Progressing toward goals ? ?  ?Frequency ? ? ? Min 3X/week ? ? ? ?  ?PT Plan Current plan remains appropriate  ? ? ?Co-evaluation   ?  ?  ?  ?  ? ?  ?AM-PAC PT "6 Clicks" Mobility   ?Outcome Measure ? Help needed turning from your back to your side while in a flat bed without using bedrails?: A Little ?Help needed moving from lying on your back to sitting on the side of a flat bed without using bedrails?: A Little ?Help needed moving to and from a bed to a chair (including a wheelchair)?: A Little ?Help needed standing up from a chair using your arms (e.g., wheelchair or bedside chair)?: A Little ?Help needed to walk in hospital room?: A Little ?Help needed climbing 3-5 steps with a railing? : Total ?6 Click Score: 16 ? ?  ?End of Session Equipment Utilized During Treatment: Gait belt ?Activity Tolerance: Patient limited by fatigue;Patient limited by pain ?Patient left: in chair;with call bell/phone within reach;with chair alarm set;Other (comment) (MD came to discuss kyphoplasty) ?Nurse Communication: Mobility  status ?PT Visit Diagnosis: Other abnormalities of gait and mobility (R26.89);Muscle weakness (generalized) (M62.81);Pain ?Pain - part of body:  (back) ?  ? ? ?Time: 2355-7322 ?PT Time Calculation (min) (ACUTE ONLY): 20 min ? ?Charges:  $Canalith Rep Proc: 8-22 mins          ?          ? ?Willadene Mounsey M,PT ?Acute Rehab Services ?[phone] ?[phone] (pager)  ? ? ?Bevelyn Buckles ?06/24/2021, 4:01 PM ? ?

## 2021-06-24 NOTE — Progress Notes (Signed)
Occupational Therapy Treatment ?Patient Details ?Name: Erin Ochoa ?MRN: [id number] ?DOB: 1940/07/01 ?Today's Date: 06/24/2021 ? ? ?History of present illness Erin Ochoa is a 81 y.o. female who presented 06/21/2021 s/p fall in her kitchen wtih immediate back pain. Imaging revealed  mild T8 and mild-moderate L1 compression fracture. Plan for possible kyphoplasty on 5/9.  Pt with medical history significant of osteoporosis, epilespy, HLD, recent left hip fracture s/p ORIF (3/23) ?  ?OT comments ? [name] did not make functional progress this session due to pain. She required increased assist and cues for bed mobility this session, and had a LOB once sitting EOB. Due to 10/10 pain pt declined progressing OOB. Pt continues to benefit from OT acutely. D/c recommendation remains appropriate.   ? ?Recommendations for follow up therapy are one component of a multi-disciplinary discharge planning process, led by the attending physician.  Recommendations may be updated based on patient status, additional functional criteria and insurance authorization. ?   ?Follow Up Recommendations ? Home health OT  ?  ?Assistance Recommended at Discharge Frequent or constant Supervision/Assistance  ?Patient can return home with the following ? A little help with walking and/or transfers;A little help with bathing/dressing/bathroom;Help with stairs or ramp for entrance;Assist for transportation ?  ?Equipment Recommendations ? BSC/3in1  ?  ?   ?Precautions / Restrictions Precautions ?Precautions: Fall;Back ?Precaution Booklet Issued: No ?Precaution Comments: verbally reviewed ?Restrictions ?Weight Bearing Restrictions: No  ? ? ?  ? ?Mobility Bed Mobility ?Overal bed mobility: Needs Assistance ?Bed Mobility: Supine to Sit ?Rolling: Min assist ?Sidelying to sit: Min assist ?  ?  ?Sit to sidelying: Mod assist ?General bed mobility comments: increased time and cues for pain management and problem solving as pt was internally distracted by pain ?   ? ?Transfers ?  ?  ?  ?  ?  ?  ?  ?  ?  ?General transfer comment: pt declined ?  ?  ?Balance Overall balance assessment: Needs assistance ?Sitting-balance support: No upper extremity supported, Feet supported ?Sitting balance-Leahy Scale: Fair ?Sitting balance - Comments: fair-poor. pt with LOB while sitting due to "sharp" back pain ?  ?  ?  ?  ?  ?  ?  ?  ?  ?  ?  ?  ?  ?  ?  ?   ? ?ADL either performed or assessed with clinical judgement  ? ?ADL Overall ADL's : Needs assistance/impaired ?  ?  ?Grooming: Min guard;Sitting ?Grooming Details (indicate cue type and reason): pt with LOB when sharp/shooting back pain is triggered ?  ?  ?  ?  ?  ?  ?  ?  ?  ?  ?  ?  ?  ?  ?Functional mobility during ADLs: Moderate assistance (at bed level) ?General ADL Comments: unable to progress beyond bed level despite pre-medication ?  ? ?Extremity/Trunk Assessment Upper Extremity Assessment ?Upper Extremity Assessment: Generalized weakness ?  ?Lower Extremity Assessment ?Lower Extremity Assessment: Defer to PT evaluation ?  ?  ?  ? ?Vision   ?Vision Assessment?: No apparent visual deficits ?  ?Perception Perception ?Perception: Not tested ?  ?Praxis   ?  ? ?Cognition Arousal/Alertness: Awake/alert ?Behavior During Therapy: Anxious ?Overall Cognitive Status: Within Functional Limits for tasks assessed ?  ?  ?  ?  ?  ?  ?  ?  ?  ?  ?  ?  ?  ?  ?  ?  ?General Comments: good recall of precautions after review - tangentail  throughout and can lose train of thought. anxious about possibly procedure. ?  ?  ?   ?Exercises   ? ?  ?Shoulder Instructions   ? ? ?  ?General Comments pt continues to report that her head "feels funny" otherwise VSS  ? ? ?Pertinent Vitals/ Pain       Pain Assessment ?Pain Assessment: Faces ?Faces Pain Scale: Hurts worst ?Pain Location: back ?Pain Descriptors / Indicators: Shooting, Sharp, Grimacing ?Pain Intervention(s): Limited activity within patient's tolerance, Monitored during session ? ?Home Living   ?  ?  ?   ?  ?  ?  ?  ?  ?  ?  ?  ?  ?  ?  ?  ?  ?  ?  ? ?  ?Prior Functioning/Environment    ?  ?  ?  ?   ? ?Frequency ? Min 2X/week  ? ? ? ? ?  ?Progress Toward Goals ? ?OT Goals(current goals can now be found in the care plan section) ? Progress towards OT goals: Progressing toward goals ? ?Acute Rehab OT Goals ?Patient Stated Goal: less pain ?OT Goal Formulation: With patient ?Time For Goal Achievement: 07/06/21 ?Potential to Achieve Goals: Good ?ADL Goals ?Pt Will Perform Lower Body Dressing: with min guard assist;with adaptive equipment;sit to/from stand ?Pt Will Transfer to Toilet: with supervision;ambulating ?Pt Will Perform Toileting - Clothing Manipulation and hygiene: with supervision;sitting/lateral leans ?Additional ADL Goal #1: Pt will indep complete bed mobility with log roll to adhere to back precautions  ?Plan Discharge plan remains appropriate   ? ?Co-evaluation ? ? ?   ?  ?  ?  ?  ? ?  ?AM-PAC OT "6 Clicks" Daily Activity     ?Outcome Measure ? ? Help from another person eating meals?: None ?Help from another person taking care of personal grooming?: A Little ?Help from another person toileting, which includes using toliet, bedpan, or urinal?: A Lot ?Help from another person bathing (including washing, rinsing, drying)?: A Lot ?Help from another person to put on and taking off regular upper body clothing?: A Little ?Help from another person to put on and taking off regular lower body clothing?: A Lot ?6 Click Score: 16 ? ?  ?End of Session   ? ?OT Visit Diagnosis: Unsteadiness on feet (R26.81);Muscle weakness (generalized) (M62.81);History of falling (Z91.81);Other abnormalities of gait and mobility (R26.89);Pain ?  ?Activity Tolerance Patient limited by pain ?  ?Patient Left in bed;with call bell/phone within reach;with bed alarm set ?  ?Nurse Communication Mobility status ?  ? ?   ? ?Time: 1630-1650 ?OT Time Calculation (min): 20 min ? ?Charges: OT General Charges ?$OT Visit: 1 Visit ?OT  Treatments ?$Therapeutic Activity: 8-22 mins ? ? ? ?Marlei Glomski A Ellise Kovack ?06/24/2021, 5:13 PM ?

## 2021-06-24 NOTE — Progress Notes (Signed)
VASCULAR LAB ? ? ? ?Carotid duplex has been performed. ? ?See CV proc for preliminary results. ? ? ?Eliyanah Elgersma, RVT ?06/24/2021, 11:38 AM ? ?

## 2021-06-25 ENCOUNTER — Inpatient Hospital Stay (HOSPITAL_COMMUNITY): Payer: Medicare Other

## 2021-06-25 DIAGNOSIS — E785 Hyperlipidemia, unspecified: Secondary | ICD-10-CM | POA: Diagnosis not present

## 2021-06-25 DIAGNOSIS — Z8669 Personal history of other diseases of the nervous system and sense organs: Secondary | ICD-10-CM | POA: Diagnosis not present

## 2021-06-25 DIAGNOSIS — S32010D Wedge compression fracture of first lumbar vertebra, subsequent encounter for fracture with routine healing: Secondary | ICD-10-CM | POA: Diagnosis not present

## 2021-06-25 DIAGNOSIS — S22000A Wedge compression fracture of unspecified thoracic vertebra, initial encounter for closed fracture: Secondary | ICD-10-CM | POA: Diagnosis not present

## 2021-06-25 MED ORDER — VANCOMYCIN HCL IN DEXTROSE 1-5 GM/200ML-% IV SOLN: 1000.0000 mg | INTRAVENOUS | Status: AC

## 2021-06-25 MED ORDER — VANCOMYCIN HCL IN DEXTROSE 1-5 GM/200ML-% IV SOLN: 1000.0000 mg | Freq: Once | INTRAVENOUS | Status: DC

## 2021-06-25 MED ORDER — OYSTER SHELL CALCIUM/D3 500-5 MG-MCG PO TABS
1.0000 | ORAL_TABLET | Freq: Every day | ORAL | Status: DC
  Administered 2021-06-25 – 2021-06-28 (×3): 1 via ORAL
  Filled 2021-06-25 (×3): qty 1

## 2021-06-25 MED ORDER — VITAMIN D (ERGOCALCIFEROL) 1.25 MG (50000 UNIT) PO CAPS
50000.0000 [IU] | ORAL_CAPSULE | ORAL | Status: DC
  Administered 2021-06-25: 50000 [IU] via ORAL
  Filled 2021-06-25 (×2): qty 1

## 2021-06-25 MED ORDER — PEG 3350-KCL-NA BICARB-NACL 420 G PO SOLR
200.0000 mL | Freq: Once | ORAL | Status: AC
  Administered 2021-06-25: 200 mL via ORAL
  Filled 2021-06-25: qty 4000

## 2021-06-25 MED ORDER — POLYVINYL ALCOHOL 1.4 % OP SOLN
1.0000 [drp] | Freq: Four times a day (QID) | OPHTHALMIC | Status: DC
  Administered 2021-06-25 – 2021-06-28 (×10): 1 [drp] via OPHTHALMIC
  Filled 2021-06-25: qty 15

## 2021-06-25 MED ORDER — VANCOMYCIN HCL IN DEXTROSE 1-5 GM/200ML-% IV SOLN: 1000.0000 mg | INTRAVENOUS | Status: DC

## 2021-06-25 MED ORDER — BOOST / RESOURCE BREEZE PO LIQD CUSTOM
1.0000 | Freq: Every day | ORAL | Status: DC
  Administered 2021-06-25 – 2021-06-28 (×2): 1 via ORAL

## 2021-06-25 MED ORDER — ENOXAPARIN SODIUM 30 MG/0.3ML IJ SOSY
30.0000 mg | PREFILLED_SYRINGE | INTRAMUSCULAR | Status: DC
  Administered 2021-06-27: 30 mg via SUBCUTANEOUS
  Filled 2021-06-25: qty 0.3

## 2021-06-25 NOTE — Care Management Important Message (Signed)
Important Message ? ?Patient Details  ?Name: Erin Ochoa ?MRN: [id number] ?Date of Birth: 1940-03-10 ? ? ?Medicare Important Message Given:  Yes ? ? ? ? ?[name] ?06/25/2021, 2:34 PM ?

## 2021-06-25 NOTE — Progress Notes (Signed)
PT Cancellation Note ? ?Patient Details ?Name: Erin Ochoa ?MRN: [id number] ?DOB: December 19, 1940 ? ? ?Cancelled Treatment:    Reason Eval/Treat Not Completed: Patient at procedure or test/unavailable (Pt in vascular earlier and now in MRI.  Will check back later date.) ? ? ?Bevelyn Buckles ?06/25/2021, 10:55 AM ?Alvis Lemmings M,PT ?Acute Rehab Services ?[phone] ?[phone] (pager)  ?

## 2021-06-25 NOTE — Progress Notes (Signed)
?  Insurance has been approved for T8 and L1 KP ?Will plan for IR procedure 5/10 ? ?See orders ?RN aware ?

## 2021-06-25 NOTE — Consult Note (Signed)
? ?Chief Complaint: ?Patient was seen in consultation today for Thoracic 8 and Lumbar 1 kyphoplasty ?Chief Complaint  ?Patient presents with  ? Fall  ? Dizziness  ? at the request of V Akula ? ?Supervising Physician: Luanne Bras ? ?Patient Status: [facility] - In-pt ? ?History of Present Illness: ?Erin Ochoa is a 81 y.o. female  ? ?Pt became "dizzy headed" at home and fell/sat down to floor in kitchen 06/21/21 ?Has had persistent severe back pain since then ?Denies incontinence; denies N/V ?Pain is 10/10 without pain meds ?7-8/10 with meds ?Cannot make much movement at all without severe mid and low back pain ?Does not sleep well ? ?MRI 5/7:IMPRESSION: ?1. Acute T8 and L1 compression fractures with mild height loss. ?Edema at T8 extends into the pedicles. CT would be helpful to better ?assess the fracture morphology. ?2. Small left subarticular disc protrusion at L5-S1 narrowing the ?left lateral recess and mildly displacing the left S1 nerve root. ?This may be a source of left S1 radiculopathy ? ?Request made for Kyphoplasty ?Pt has been "considering " this procedure for few days ?Now with continued pain that seems unrelenting-- she has decided to move ahead with procedure ?She understands we will place her case into precert for insurance today ?Must have approval before procedure ? ?Plan for procedure asap ? ? ?Past Medical History:  ?Diagnosis Date  ? Anxiety   ? Cataract   ? Chronic constipation   ? Depression   ? Osteoporosis   ? Seizures ([place])   ? epilepsy  last seizure 1977  ? ? ?Past Surgical History:  ?Procedure Laterality Date  ? ABDOMINAL HYSTERECTOMY  1995  ? Dr. Edwyna Shell  ? CATARACT EXTRACTION Bilateral 12/2016  ? COLONOSCOPY  2012  ? patchy increased intraepithelial lymphocytes - draelos  ? ESOPHAGOGASTRODUODENOSCOPY    ? multiple  ? FISSURECTOMY    ? INTRAMEDULLARY (IM) NAIL INTERTROCHANTERIC Left 05/09/2021  ? Procedure: INTRAMEDULLARY (IM) NAIL INTERTROCHANTRIC;  Surgeon: Meredith Pel, MD;  Location: [place];  Service: Orthopedics;  Laterality: Left;  ? WRIST SURGERY Left   ? due to fracture  ? ? ?Allergies: ?Cat hair extract, Cefaclor, Codeine, Fish allergy, Iodinated contrast media, Other, Penicillins, Percocet [oxycodone-acetaminophen], Red dye, Senna-docusate sodium [sennosides-docusate sodium], Shrimp (diagnostic), and Tomato ? ?Medications: ?Prior to Admission medications   ?Medication Sig Start Date End Date Taking? Authorizing Provider  ?acetaminophen (TYLENOL) 500 MG tablet Take 1,000 mg by mouth every 8 (eight) hours as needed for mild pain.   Yes [provider]  ?Artificial Tear Solution (SOOTHE XP) SOLN Place 1 drop into both eyes in the morning, at noon, in the evening, and at bedtime.   Yes [provider]  ?atorvastatin (LIPITOR) 10 MG tablet TAKE (1/2) TABLET DAILY. ?Patient taking differently: Take 5 mg by mouth daily. 04/15/21  Yes Mast, Man X, NP  ?Calcium Carbonate-Vit D-Min (CALTRATE 600+D PLUS MINERALS) 600-800 MG-UNIT CHEW Chew 2 each by mouth daily.   Yes [provider]  ?clonazePAM (KLONOPIN) 1 MG tablet Take 1 tablet (1 mg total) by mouth at bedtime. 06/12/21  Yes Yvonna Alanis, NP  ?Multiple Vitamins-Minerals (PRESERVISION AREDS PO) Take 1 tablet by mouth 2 (two) times daily.   Yes [provider]  ?Nutritional Supplements (BOOST GLUCOSE CONTROL) LIQD Take 1 Package by mouth daily.   Yes [provider]  ?polyethylene glycol powder (GLYCOLAX/MIRALAX) 17 GM/SCOOP powder Take 17 g by mouth daily.   Yes [provider]  ?TEGRETOL 200 MG  tablet TAKE 1&1/2 TABLETS ONCE DAILY. ?Patient taking differently: Take 300 mg by mouth daily. Brand name only 03/05/21  Yes Mast, Man X, NP  ?Vitamin D, Ergocalciferol, (DRISDOL) 1.25 MG (50000 UNIT) CAPS capsule TAKE 1 CAPSULE WEEKLY. ?Patient taking differently: Take 50,000 Units by mouth once a week. Tuesdays 09/18/20  Yes Mast, Man X, NP  ?clonazePAM (KLONOPIN) 0.5 MG tablet Take 1  tablet (0.5 mg total) by mouth daily as needed for up to 14 days for anxiety. ?Patient not taking: Reported on 06/22/2021 05/28/21 06/22/21  Virgie Dad, MD  ?GOLYTELY 236 g solution MIX WITH WATER AND DRINK 3-3 1/2 OUNCES DAILY AS DIRECTED. ?Patient not taking: Reported on 06/22/2021 02/28/21   Mast, Man X, NP  ?senna-docusate (SENOKOT-S) 8.6-50 MG tablet Take 2 tablets by mouth 2 (two) times daily. ?Patient not taking: Reported on 06/22/2021 05/13/21   Terrilee Croak, MD  ?  ? ?Family History  ?Problem Relation Age of Onset  ? Alcohol abuse Father   ? Diabetes Father   ? Diabetes Maternal Grandmother   ? Diabetes Paternal Grandmother   ? Colon cancer Maternal Grandfather   ? Esophageal cancer Neg Hx   ? Rectal cancer Neg Hx   ? Stomach cancer Neg Hx   ? ? ?Social History  ? ?Socioeconomic History  ? Marital status: Widowed  ?  Spouse name: Not on file  ? Number of children: 0  ? Years of education: Not on file  ? Highest education level: Some college, no degree  ?Occupational History  ? Occupation: Retired  ?Tobacco Use  ? Smoking status: Former  ?  Packs/day: 1.00  ?  Types: Cigarettes  ?  Start date: 02/18/1956  ?  Quit date: 02/17/1981  ?  Years since quitting: 40.3  ? Smokeless tobacco: Never  ?Vaping Use  ? Vaping Use: Never used  ?Substance and Sexual Activity  ? Alcohol use: No  ?  Alcohol/week: 0.0 standard drinks  ? Drug use: No  ? Sexual activity: Not on file  ?Other Topics Concern  ? Not on file  ?Social History Narrative  ? Social History  ?   Marital status: Widowed           Spouse name:                     ?   Years of education:  1 year college              Number of children:0          ? Widowed no children  ?   ? Occupational History: Stage manager, [employer] manager care, [employer].)  ?   None on file  ?   ? Social History Main Topics  ?   Smoking status: Former Smoker                                       ?      Packs/day: 1.00      Years: 0.00      ?      Types: Cigarettes   ?      Smokeless tobacco: Not on file                     ?   Alcohol use: No            ?  Drug use: No            ?   Sexual activity: Not on file        ?   ? Does not drink caffeine, but does eat chocolate.  ? Does live in an apartment ([address]) retirement community does exercise : walks daily  ? Right handed  ?   ?   ? ?Social Determinants of Health  ? ?Financial Resource Strain: Not on file  ?Food Insecurity: Not on file  ?Transportation Needs: Not on file  ?Physical Activity: Not on file  ?Stress: Not on file  ?Social Connections: Not on file  ? ? ?Review of Systems: A 12 point ROS discussed and pertinent positives are indicated in the HPI above.  All other systems are negative. ? ?Review of Systems  ?Constitutional:  Positive for activity change, appetite change and fatigue. Negative for fever.  ?Respiratory:  Negative for cough and shortness of breath.   ?Cardiovascular:  Negative for chest pain.  ?Gastrointestinal:  Negative for abdominal pain.  ?Musculoskeletal:  Positive for back pain and gait problem.  ?Neurological:  Positive for weakness.  ?Psychiatric/Behavioral:  Negative for behavioral problems and confusion.   ? ?Vital Signs: ?BP 123/63 (BP Location: Right Arm)   Pulse 74   Temp 97.9 ?F (36.6 ?C) (Oral)   Resp 14   Ht 5\' 7"  (1.702 m)   Wt 105 lb 6.1 oz (47.8 kg)   SpO2 95%   BMI 16.50 kg/m?  ? ?Physical Exam ?Vitals reviewed.  ?HENT:  ?   Mouth/Throat:  ?   Mouth: Mucous membranes are moist.  ?Cardiovascular:  ?   Rate and Rhythm: Normal rate and regular rhythm.  ?   Heart sounds: Normal heart sounds.  ?Pulmonary:  ?   Effort: Pulmonary effort is normal.  ?   Breath sounds: Normal breath sounds.  ?Abdominal:  ?   Palpations: Abdomen is soft.  ?   Tenderness: There is no abdominal tenderness.  ?Musculoskeletal:     ?   General: Normal range of motion.  ?   Comments: Back pain at mid and low back  ?Skin: ?   General: Skin is warm.  ?Neurological:  ?   Mental Status: She is alert and oriented  to person, place, and time.  ?Psychiatric:     ?   Behavior: Behavior normal.  ? ? ?Imaging: ?DG Chest 2 View ? ?Result Date: 06/21/2021 ?CLINICAL DATA:  Lower back pain. EXAM: CHEST - 2 VIEW COMPARISON:  March 23,

## 2021-06-25 NOTE — Progress Notes (Signed)
? ? ? [facility] ?                                                                            ? ? ?Erin Ochoa, is a 81 y.o. female, DOB - [birth year], WU:[id number] ?Admit date - 06/21/2021    ?Outpatient Primary MD for the patient is Mast, Man X, NP ? ?LOS - 3  days ? ? ? ?Brief summary  ? ?Erin Ochoa is a 81 y.o. female with medical history significant of osteoporosis, epilespy, HLD, recent left hip fracture s/p ORIF presents for a fall. ? ?In the ED, she was afebrile, normotensive on rrom air. CT head negative. L-spine and CXR showed mild T8 and mild-moderate L1 compression fracture. CBC, BMP unremarkable. Admission requested for pain control. IR consulted for kyphoplasty.  ?  ? ?Assessment & Plan  ? ? ?Assessment and Plan: ? ?Lumbar compression fracture ([name]) ?Thoracic compression fracture ?T8 and L1  mild fractures s/p fall ?Pain control and therapy evaluation .  ?IR consult for kyphoplasty.  ?MRI of the lumbar and thoracic spine ordered for further evaluation showed Acute T8 and L1 compression fractures with mild height loss. Edema at T8 extends into the pedicles.  ?Small left subarticular disc protrusion at L5-S1 narrowing the  left lateral recess and mildly displacing the left S1 nerve root. ?Awaiting IR recommendations.  ? ? ? ?Vertigo/ Dizziness. Possibly from orthostatics ?Unclear etiology at this time and unable to test for BPPV on exam since patient has severe back pain.   ?Meclizine PRN for dizziness ?Will need Vestibular evaluation when she is able to tolerate pain and work with PT.  ?Orthostatic vital signs slightly positive. Ted hoses ordered.  ?Carotid duplex is not significant. MRI brain without contrast is negative for infarct.  ?No arrhythmias on telemetry.  ?No syncope .  ? ?History of epilepsy ?Continue Tegretol 300 mg p.o. at bedtime- require brand-name only for seizure control. ?-Follow-up with neurology outpatient ? ?Hyperlipidemia ?Continue statin ? ?Anxiety ?Recommend  outpatient follow up with psychiatry . Continue with klonopin.  ? ?Osteoporosis ?Continue Caltrate ?Recommend follow with PCP to follow DEXA and to consider Bisphonate given repeat falls and fractures ?Resume home meds.  ? ? ?Hyponatremia:  ?- improved with IV fluids.  ? Sodium is 133.  ? ?Constipation;  ?Started the patient on senna, miralax, MOM and golytely ordered.  ? ?  ? ?Estimated body mass index is 16.5 kg/m? as calculated from the following: ?  Height as of this encounter: 5\' 7"  (1.702 m). ?  Weight as of this encounter: 47.8 kg. ? ?Code Status: full code.  ?DVT Prophylaxis:  Place TED hose Start: 06/24/21 0943 ?enoxaparin (LOVENOX) injection 30 mg Start: 06/21/21 2145 ? ? ?Level of Care: Level of care: Telemetry Medical ?Family Communication: none at bedside.  ? ?Disposition Plan:     Remains inpatient appropriate:  IR consultation for kyphoplasty and therapy evaluation.  ? ?Procedures:  ?None.  ?Consultants:   ?IR  ? ?Antimicrobials:  ? ?Anti-infectives (From admission, onward)  ? ? None  ? ?  ? ? ? ?Medications ? ?Scheduled Meds: ? atorvastatin  5 mg Oral Daily  ? calcium-vitamin D  1 tablet Oral Daily  ?  carbamazepine  300 mg Oral QHS  ? clonazePAM  1 mg Oral QHS  ? enoxaparin (LOVENOX) injection  30 mg Subcutaneous Q24H  ? feeding supplement  1 Container Oral Daily  ? feeding supplement (GLUCERNA SHAKE)  237 mL Oral TID BM  ? lidocaine  1 patch Transdermal Q24H  ? polyethylene glycol  17 g Oral Daily  ? polyethylene glycol-electrolytes  200 mL Oral Once  ? polyvinyl alcohol  1 drop Both Eyes QID  ? Vitamin D (Ergocalciferol)  50,000 Units Oral Weekly  ? ?Continuous Infusions: ?PRN Meds:.acetaminophen, meclizine, methocarbamol, morphine injection, ondansetron (ZOFRAN) IV ? ? ? ?Subjective:  ? ?Erin Ochoa was seen and examined today. NO bm.  ? ?Objective:  ? ?Vitals:  ? 06/25/21 0500 06/25/21 0600 06/25/21 0700 06/25/21 1205  ?BP:   123/63 130/65  ?Pulse: 73 71 74 79  ?Resp: 19 15 14 14   ?Temp:   97.9  ?F (36.6 ?C) 97.8 ?F (36.6 ?C)  ?TempSrc:   Oral Oral  ?SpO2: 96% 97% 95% 96%  ?Weight:      ?Height:      ? ? ?Intake/Output Summary (Last 24 hours) at 06/25/2021 1407 ?Last data filed at 06/25/2021 1206 ?Gross per 24 hour  ?Intake 360 ml  ?Output 1650 ml  ?Net -1290 ml  ? ? ?Filed Weights  ? 06/21/21 1126  ?Weight: 47.8 kg  ? ? ? ?General exam: Appears calm and comfortable  ?Respiratory system: Clear to auscultation. Respiratory effort normal. ?Cardiovascular system: S1 & S2 heard, RRR. No JVD, No pedal edema. ?Gastrointestinal system: Abdomen is nondistended, soft and nontender.  Normal bowel sounds heard. ?Central nervous system: Alert and oriented. No focal neurological deficits. ?Extremities: Symmetric 5 x 5 power. ?Skin: No rashes, lesions or ulcers ?Psychiatry:  Mood & affect appropriate.  ? ? ? ? ? ?Data Reviewed:  I have personally reviewed following labs and imaging studies ? ? ?CBC ?Lab Results  ?Component Value Date  ? WBC 10.0 06/21/2021  ? RBC 4.08 06/21/2021  ? HGB 13.0 06/21/2021  ? HCT 39.7 06/21/2021  ? MCV 97.3 06/21/2021  ? MCH 31.9 06/21/2021  ? PLT 215 06/21/2021  ? MCHC 32.7 06/21/2021  ? RDW 13.2 06/21/2021  ? LYMPHSABS 0.7 06/21/2021  ? MONOABS 0.9 06/21/2021  ? EOSABS 0.0 06/21/2021  ? BASOSABS 0.0 06/21/2021  ? ? ? ?Last metabolic panel ?Lab Results  ?Component Value Date  ? NA 133 (L) 06/22/2021  ? K 3.7 06/22/2021  ? CL 100 06/22/2021  ? CO2 28 06/22/2021  ? BUN 9 06/22/2021  ? CREATININE 0.52 06/22/2021  ? GLUCOSE 155 (H) 06/22/2021  ? GFRNONAA >60 06/22/2021  ? GFRAA 99 08/15/2020  ? CALCIUM 8.7 (L) 06/22/2021  ? PROT 6.6 06/21/2021  ? ALBUMIN 4.3 06/21/2021  ? BILITOT 0.5 06/21/2021  ? ALKPHOS 99 06/21/2021  ? AST 30 06/21/2021  ? ALT 32 06/21/2021  ? ANIONGAP 5 06/22/2021  ? ? ?CBG (last 3)  ?No results for input(s): GLUCAP in the last 72 hours.  ? ? ?Coagulation Profile: ?No results for input(s): INR, PROTIME in the last 168 hours. ? ? ?Radiology Studies: ?MR BRAIN WO  CONTRAST ? ?Result Date: 06/25/2021 ?CLINICAL DATA:  Dizziness, persistent or recurrent with cardiac or vascular cause suspected. EXAM: MRI HEAD WITHOUT CONTRAST TECHNIQUE: Multiplanar, multiecho pulse sequences of the brain and surrounding structures were obtained without intravenous contrast. COMPARISON:  Head CT from 4 days ago FINDINGS: Brain: No acute infarction, hemorrhage, hydrocephalus, or mass lesion. Generalized  brain atrophy with ventriculomegaly and diffuse sulcal widening. Less than typical for age periventricular chronic small vessel ischemia. Trace subdural space FLAIR hyperintensity on 11:21, low-density and chronic by CT. Vascular: Major flow voids are preserved, including the dominant left vertebral artery and basilar. Skull and upper cervical spine: No focal marrow lesion. Sinuses/Orbits: Negative for pathologic finding. Bilateral cataract resection. IMPRESSION: 1. No acute finding, including infarct. 2. Generalized brain atrophy. Electronically Signed   By: Jorje Guild M.D.   On: 06/25/2021 11:34  ? ?MR THORACIC SPINE WO CONTRAST ? ?Result Date: 06/23/2021 ?CLINICAL DATA:  Fall EXAM: MRI THORACIC AND LUMBAR SPINE WITHOUT CONTRAST TECHNIQUE: Multiplanar and multiecho pulse sequences of the thoracic and lumbar spine were obtained without intravenous contrast. COMPARISON:  None Available. FINDINGS: MRI THORACIC SPINE FINDINGS Alignment:  Physiologic. Vertebrae: There is a T8 compression fracture with mild height loss and edema extending into both pedicles. No retropulsion. Cord:  Normal Paraspinal and other soft tissues: Small pleural effusions Disc levels: No spinal canal stenosis.  Small disc protrusion at T9-10. MRI LUMBAR SPINE FINDINGS Segmentation:  Standard. Alignment:  Physiologic. Vertebrae: L1 compression fracture with mild height loss and edema within the top half of the vertebral body. No retropulsion. Conus medullaris and cauda equina: Conus extends to the L1 level. Conus and cauda  equina appear normal. Paraspinal and other soft tissues: 4.2 cm right renal cyst for which no follow-up is needed. Disc levels: L1-L2: Small central disc protrusion. No spinal canal stenosis. No neural foraminal stenosis. L2-L

## 2021-06-25 NOTE — Progress Notes (Signed)
Request received for KP. ? ?Imaging reviewed with Dr. Corliss Skains, patient appears to be a good candidate for T8 and L1 KP.  ? ?Patient was seen by this PA and Dr. Drusilla Kanner in two different visits  to discuss risks and benefits of KP.  ?Patient states that her back pain is severe but she does not want to take narcotics due to concerns for addiction and worsening constipation.  ? ?On physical exam, patient has severe TTP on upper and lower back to the point she becomes tearful.  ?She has not been able to get out of the bed nor participate PT well due to severe back pain.  ? ?After thorough discussion, patient is hesitant to proceed at the moment, as she was under the impression that KP will eliminate her back pain completely, not just "partially reduce." ?She stated that she would like to think about KP and will let Dr. Corliss Skains know what her final decision is in the near future.  ?Informed that patient that KP require insurance approval before NIR can schedule the procedure, and also informed the patient that KP can be scheduled as outpatient bases as well.  ?She verbalized understanding.  ? ?NIR to follow.  ? ?Erin Brace PA-C ?06/25/2021 8:39 AM ? ? ? ?

## 2021-06-26 ENCOUNTER — Inpatient Hospital Stay (HOSPITAL_COMMUNITY): Payer: Medicare Other

## 2021-06-26 DIAGNOSIS — Z8669 Personal history of other diseases of the nervous system and sense organs: Secondary | ICD-10-CM | POA: Diagnosis not present

## 2021-06-26 DIAGNOSIS — S32010D Wedge compression fracture of first lumbar vertebra, subsequent encounter for fracture with routine healing: Secondary | ICD-10-CM | POA: Diagnosis not present

## 2021-06-26 DIAGNOSIS — F419 Anxiety disorder, unspecified: Secondary | ICD-10-CM | POA: Diagnosis not present

## 2021-06-26 DIAGNOSIS — S22060A Wedge compression fracture of T7-T8 vertebra, initial encounter for closed fracture: Secondary | ICD-10-CM | POA: Diagnosis not present

## 2021-06-26 HISTORY — PX: IR KYPHO THORACIC WITH BONE BIOPSY: IMG5518

## 2021-06-26 HISTORY — PX: IR KYPHO EA ADDL LEVEL THORACIC OR LUMBAR: IMG5520

## 2021-06-26 LAB — CBC WITH DIFFERENTIAL/PLATELET
Abs Immature Granulocytes: 0.03 10*3/uL (ref 0.00–0.07)
Basophils Absolute: 0 10*3/uL (ref 0.0–0.1)
Basophils Relative: 1 %
Eosinophils Absolute: 0.2 10*3/uL (ref 0.0–0.5)
Eosinophils Relative: 3 %
HCT: 38.2 % (ref 36.0–46.0)
Hemoglobin: 13.1 g/dL (ref 12.0–15.0)
Immature Granulocytes: 1 %
Lymphocytes Relative: 17 %
Lymphs Abs: 1 10*3/uL (ref 0.7–4.0)
MCH: 33.1 pg (ref 26.0–34.0)
MCHC: 34.3 g/dL (ref 30.0–36.0)
MCV: 96.5 fL (ref 80.0–100.0)
Monocytes Absolute: 0.9 10*3/uL (ref 0.1–1.0)
Monocytes Relative: 15 %
Neutro Abs: 3.9 10*3/uL (ref 1.7–7.7)
Neutrophils Relative %: 63 %
Platelets: 194 10*3/uL (ref 150–400)
RBC: 3.96 MIL/uL (ref 3.87–5.11)
RDW: 12.7 % (ref 11.5–15.5)
WBC: 6.1 10*3/uL (ref 4.0–10.5)
nRBC: 0 % (ref 0.0–0.2)

## 2021-06-26 LAB — BASIC METABOLIC PANEL
Anion gap: 7 (ref 5–15)
BUN: 13 mg/dL (ref 8–23)
CO2: 26 mmol/L (ref 22–32)
Calcium: 9.2 mg/dL (ref 8.9–10.3)
Chloride: 97 mmol/L — ABNORMAL LOW (ref 98–111)
Creatinine, Ser: 0.54 mg/dL (ref 0.44–1.00)
GFR, Estimated: >60 mL/min (ref >60)
Glucose, Bld: 186 mg/dL — ABNORMAL HIGH (ref 70–99)
Potassium: 4 mmol/L (ref 3.5–5.1)
Sodium: 130 mmol/L — ABNORMAL LOW (ref 135–145)

## 2021-06-26 LAB — PROTIME-INR
INR: 1 (ref 0.8–1.2)
Prothrombin Time: 12.9 seconds (ref 11.4–15.2)

## 2021-06-26 MED ORDER — FENTANYL CITRATE (PF) 100 MCG/2ML IJ SOLN
INTRAMUSCULAR | Status: AC | PRN
  Administered 2021-06-26 (×3): 25 ug via INTRAVENOUS

## 2021-06-26 MED ORDER — FENTANYL CITRATE (PF) 100 MCG/2ML IJ SOLN
INTRAMUSCULAR | Status: AC
  Filled 2021-06-26: qty 4

## 2021-06-26 MED ORDER — BUPIVACAINE HCL (PF) 0.5 % IJ SOLN
INTRAMUSCULAR | Status: AC
  Filled 2021-06-26: qty 60

## 2021-06-26 MED ORDER — BUPIVACAINE HCL (PF) 0.25 % IJ SOLN
INTRAMUSCULAR | Status: AC | PRN
  Administered 2021-06-26: 30 mL

## 2021-06-26 MED ORDER — SODIUM CHLORIDE 0.9 % IV SOLN: INTRAVENOUS | Status: AC

## 2021-06-26 MED ORDER — IOHEXOL 300 MG/ML  SOLN
100.0000 mL | Freq: Once | INTRAMUSCULAR | Status: AC | PRN
  Administered 2021-06-26: 1 mL

## 2021-06-26 MED ORDER — VANCOMYCIN HCL IN DEXTROSE 1-5 GM/200ML-% IV SOLN
INTRAVENOUS | Status: AC
  Administered 2021-06-26: 1000 mg via INTRAVENOUS
  Filled 2021-06-26: qty 200

## 2021-06-26 MED ORDER — TOBRAMYCIN SULFATE 1.2 G IJ SOLR
INTRAMUSCULAR | Status: AC
  Administered 2021-06-26: 1 mg
  Filled 2021-06-26: qty 1.2

## 2021-06-26 MED ORDER — MIDAZOLAM HCL 2 MG/2ML IJ SOLN
INTRAMUSCULAR | Status: AC | PRN
  Administered 2021-06-26 (×2): 1 mg via INTRAVENOUS

## 2021-06-26 MED ORDER — MIDAZOLAM HCL 2 MG/2ML IJ SOLN
INTRAMUSCULAR | Status: AC
  Filled 2021-06-26: qty 4

## 2021-06-26 MED ORDER — SODIUM CHLORIDE 0.9 % IV SOLN: INTRAVENOUS | Status: DC

## 2021-06-26 NOTE — Procedures (Signed)
INR. ?T8 and L1 balloon kyphoplasty for painful compression fractures.  Biopsies obtained. ?Fatima Sanger MD. ?

## 2021-06-26 NOTE — Progress Notes (Signed)
?PROGRESS NOTE ? ? ? ?Blossom Crume  MCN:[id number] DOB: 08/21/1940 DOA: 06/21/2021 ?PCP: Mast, Man X, NP  ? ? ?Brief Narrative:  ?81 year old female from independent living facility, history of osteoporosis, seizure, hyperlipidemia and recent hip fracture status post ORIF left hip presented with an episode of fall.  In the emergency room she was afebrile.  Normotensive.  CT head negative.  He LS-spine showed T8 and L1 compression fractures.  Admitted for pain control and significant symptoms. ? ? ?Assessment & Plan: ?  ?Traumatic lumbar compression fracture, thoracic compression fracture: ?Patient continued to have significant pain and unable to mobilize.  Currently on adequate pain medications along with laxatives. ?Seen by radiology, scheduled for T8 and L1 kyphoplasty today.  Work with PT OT after coming back from kyphoplasty. ? ?Vertigo and dizziness: Currently with back pain and unable to mobilize much.  MRI brain was without any acute findings.  Carotid duplex is without any significant blockage.  Mild positive orthostatic symptoms. ?Continue to work with PT OT. ? ?History of seizure disorder: Currently on Tegretol that is continued. ? ?Hyperlipidemia: Statin. ? ?Anxiety: On Klonopin. ? ? ?DVT prophylaxis: enoxaparin (LOVENOX) injection 30 mg Start: 06/27/21 2200 ?Place TED hose Start: 06/24/21 0943 ? ? ?Code Status: Full code ?Family Communication: None at the bedside ?Disposition Plan: Status is: Inpatient ?Remains inpatient appropriate because: Inpatient procedures planned ?  ? ? ?Consultants:  ?Interventional radiology ? ?Procedures:  ?Planned kyphoplasty ? ?Antimicrobials:  ?Preop antibiotics.  Not on scheduled medications. ? ? ?Subjective: ?Patient seen and examined.  No issues at rest.  Not sure how she will do on mobility.  She lost her sleep last night because they came to check her blood pressure at 4 AM in the morning. ?Looking forward for procedures but worried about being hungry.  She tells me her  head is already swimming. ? ?Objective: ?Vitals:  ? 06/25/21 1934 06/25/21 2320 06/26/21 0324 06/26/21 0743  ?BP: 135/71 122/60 131/61 132/61  ?Pulse: 78 73 77 81  ?Resp:    20  ?Temp: 98.1 ?F (36.7 ?C) 98.1 ?F (36.7 ?C) 97.7 ?F (36.5 ?C) 98.2 ?F (36.8 ?C)  ?TempSrc:    Oral  ?SpO2: 96% 96% 98% 97%  ?Weight:      ?Height:      ? ? ?Intake/Output Summary (Last 24 hours) at 06/26/2021 1057 ?Last data filed at 06/26/2021 1040 ?Gross per 24 hour  ?Intake --  ?Output 1550 ml  ?Net -1550 ml  ? ?Filed Weights  ? 06/21/21 1126  ?Weight: 47.8 kg  ? ? ?Examination: ? ?General exam: Appears calm and comfortable, slightly anxious. ?Respiratory system: Clear to auscultation. Respiratory effort normal.  No added sounds. ?Cardiovascular system: S1 & S2 heard, RRR. No JVD, murmurs, rubs, gallops or clicks. No pedal edema. ?Gastrointestinal system: Abdomen is nondistended, soft and nontender. No organomegaly or masses felt. Normal bowel sounds heard. ?Central nervous system: Alert and oriented. No focal neurological deficits. ?Extremities: Symmetric 5 x 5 power. ?Skin: No rashes, lesions or ulcers ?Psychiatry: Judgement and insight appear normal. Mood & affect appropriate.  ? ? ? ?Data Reviewed: I have personally reviewed following labs and imaging studies ? ?CBC: ?Recent Labs  ?Lab 06/21/21 ?1145 06/26/21 ?6294  ?WBC 10.0 6.1  ?NEUTROABS 8.3* 3.9  ?HGB 13.0 13.1  ?HCT 39.7 38.2  ?MCV 97.3 96.5  ?PLT 215 194  ? ?Basic Metabolic Panel: ?Recent Labs  ?Lab 06/21/21 ?1145 06/22/21 ?0312 06/26/21 ?7654  ?NA 130* 133* 130*  ?K 4.2 3.7 4.0  ?  CL 94* 100 97*  ?CO2 28 28 26   ?GLUCOSE 170* 155* 186*  ?BUN 13 9 13   ?CREATININE 0.60 0.52 0.54  ?CALCIUM 9.4 8.7* 9.2  ? ?GFR: ?Estimated Creatinine Clearance: 42.3 mL/min (by C-G formula based on SCr of 0.54 mg/dL). ?Liver Function Tests: ?Recent Labs  ?Lab 06/21/21 ?1145  ?AST 30  ?ALT 32  ?ALKPHOS 99  ?BILITOT 0.5  ?PROT 6.6  ?ALBUMIN 4.3  ? ?No results for input(s): LIPASE, AMYLASE in the last 168  hours. ?No results for input(s): AMMONIA in the last 168 hours. ?Coagulation Profile: ?Recent Labs  ?Lab 06/26/21 ?[id number]  ?INR 1.0  ? ?Cardiac Enzymes: ?No results for input(s): CKTOTAL, CKMB, CKMBINDEX, TROPONINI in the last 168 hours. ?BNP (last 3 results) ?No results for input(s): PROBNP in the last 8760 hours. ?HbA1C: ?No results for input(s): HGBA1C in the last 72 hours. ?CBG: ?No results for input(s): GLUCAP in the last 168 hours. ?Lipid Profile: ?No results for input(s): CHOL, HDL, LDLCALC, TRIG, CHOLHDL, LDLDIRECT in the last 72 hours. ?Thyroid Function Tests: ?No results for input(s): TSH, T4TOTAL, FREET4, T3FREE, THYROIDAB in the last 72 hours. ?Anemia Panel: ?No results for input(s): VITAMINB12, FOLATE, FERRITIN, TIBC, IRON, RETICCTPCT in the last 72 hours. ?Sepsis Labs: ?No results for input(s): PROCALCITON, LATICACIDVEN in the last 168 hours. ? ?No results found for this or any previous visit (from the past 240 hour(s)).  ? ? ? ? ? ?Radiology Studies: ?MR BRAIN WO CONTRAST ? ?Result Date: 06/25/2021 ?CLINICAL DATA:  Dizziness, persistent or recurrent with cardiac or vascular cause suspected. EXAM: MRI HEAD WITHOUT CONTRAST TECHNIQUE: Multiplanar, multiecho pulse sequences of the brain and surrounding structures were obtained without intravenous contrast. COMPARISON:  Head CT from 4 days ago FINDINGS: Brain: No acute infarction, hemorrhage, hydrocephalus, or mass lesion. Generalized brain atrophy with ventriculomegaly and diffuse sulcal widening. Less than typical for age periventricular chronic small vessel ischemia. Trace subdural space FLAIR hyperintensity on 11:21, low-density and chronic by CT. Vascular: Major flow voids are preserved, including the dominant left vertebral artery and basilar. Skull and upper cervical spine: No focal marrow lesion. Sinuses/Orbits: Negative for pathologic finding. Bilateral cataract resection. IMPRESSION: 1. No acute finding, including infarct. 2. Generalized brain  atrophy. Electronically Signed   By: Tiburcio PeaJonathan  Watts M.D.   On: 06/25/2021 11:34  ? ?VAS US CAROTID ? ?Result Date: 06/24/2021 ?Carotid Arterial Duplex Study Patient Name:  Lavonne ChickBENNIE Winther  Date of Exam:   06/24/2021 Medical Rec #: [id number]      Accession #:    [id number] Date of Birth: 16-Aug-1940      Patient Gender: F Patient Age:   [age] years [facility] Procedure:      VAS US CAROTID Referring Phys: Kathlen ModyVIJAYA AKULA --------------------------------------------------------------------------------  Indications:       Dizziness, fall resulting in thoracic compression fracture                    06/21/21. Risk Factors:      Hyperlipidemia. Other Factors:     Epilepsy. Comparison Study:  No prior study on file Performing Technologist: Sherren Kernsandace Kanady RVS  Examination Guidelines: A complete evaluation includes B-mode imaging, spectral Doppler, color Doppler, and power Doppler as needed of all accessible portions of each vessel. Bilateral testing is considered an integral part of a complete examination. Limited examinations for reoccurring indications may be performed as noted.  Right Carotid Findings: +----------+--------+--------+--------+------------------+------------------+           PSV cm/sEDV  cm/sStenosisPlaque DescriptionComments           +----------+--------+--------+--------+------------------+------------------+ CCA Prox  81      11                                intimal thickening +----------+--------+--------+--------+------------------+------------------+ CCA Distal146     21                                intimal thickening +----------+--------+--------+--------+------------------+------------------+ ICA Prox  101     10              heterogenous                         +----------+--------+--------+--------+------------------+------------------+ ICA Distal120     25                                                    +----------+--------+--------+--------+------------------+------------------+ ECA       117     2                                                    +----------+--------+--------+--------+------------------+------------------+ +----------+--------+-------+--------+-------------------

## 2021-06-26 NOTE — Progress Notes (Signed)
PT Cancellation Note ? ?Patient Details ?Name: Erin Ochoa ?MRN: [id number] ?DOB: 03/01/1940 ? ? ?Cancelled Treatment:    Reason Eval/Treat Not Completed: Patient at procedure or test/unavailable; patient in IR for kyphoplasty.  Will follow up tomorrow.  ? ? ?[name] ?06/26/2021, 4:38 PM ?Sheran Lawless, PT ?Acute Rehabilitation Services ?[phone] ?Office:[phone] ?06/26/2021 ? ?

## 2021-06-26 NOTE — Discharge Instructions (Signed)
1.  No stooping or bending or lifting weights above 10 pounds for 2 weeks. ?2.  Use walker to ambulate for 2 weeks. ?3.  No driving for 2 weeks. ?4.  Call referral MDs office for biopsy reports. ?5.  RTC as needed in 2 weeks. ? ?Fatima Sanger MD. ? ? ?

## 2021-06-27 DIAGNOSIS — S22060A Wedge compression fracture of T7-T8 vertebra, initial encounter for closed fracture: Secondary | ICD-10-CM | POA: Diagnosis not present

## 2021-06-27 DIAGNOSIS — F419 Anxiety disorder, unspecified: Secondary | ICD-10-CM | POA: Diagnosis not present

## 2021-06-27 DIAGNOSIS — Z8669 Personal history of other diseases of the nervous system and sense organs: Secondary | ICD-10-CM | POA: Diagnosis not present

## 2021-06-27 DIAGNOSIS — S32010D Wedge compression fracture of first lumbar vertebra, subsequent encounter for fracture with routine healing: Secondary | ICD-10-CM | POA: Diagnosis not present

## 2021-06-27 MED ORDER — FLEET ENEMA 7-19 GM/118ML RE ENEM
1.0000 | ENEMA | Freq: Once | RECTAL | Status: AC
  Administered 2021-06-27: 1 via RECTAL
  Filled 2021-06-27: qty 1

## 2021-06-27 MED ORDER — HYDROCODONE-ACETAMINOPHEN 5-325 MG PO TABS
1.0000 | ORAL_TABLET | ORAL | Status: DC | PRN
  Administered 2021-06-27 – 2021-06-28 (×4): 1 via ORAL
  Filled 2021-06-27 (×4): qty 1

## 2021-06-27 NOTE — Progress Notes (Signed)
Mobility Specialist: Progress Note ? ? 06/27/21 1254  ?Mobility  ?Activity Ambulated with assistance in hallway  ?Level of Assistance Contact guard assist, steadying assist  ?Assistive Device Front wheel walker  ?Distance Ambulated (ft) 100 ft  ?Activity Response Tolerated well  ?$Mobility charge 1 Mobility  ? ?Pt received in bed, reluctant but agreeable to ambulation. C/o back pain during session as well as mild dizziness, otherwise no c/o. Pt said she feels better this session in comparison to this morning. Pt back to bed with call bell and phone at her side. Bed alarm is on.  ? ?Erin Ochoa ?Mobility Specialist ?Mobility Specialist [place]: [phone] ?Mobility Specialist [place]: [phone] ? ?

## 2021-06-27 NOTE — Progress Notes (Signed)
OT Cancellation Note ? ?Patient Details ?Name: Erin Ochoa ?MRN: [id number] ?DOB: 1940/07/25 ? ? ?Cancelled Treatment:    Reason Eval/Treat Not Completed: Pain limiting ability to participate (Pt had pain medication but declining therapy at this time.) ? ?Joeseph Amor OTR/L  ?Acute Rehab Services  ?[phone] office number ?[phone] pager number ? ?Joeseph Amor ?06/27/2021, 11:35 AM ?

## 2021-06-27 NOTE — Progress Notes (Signed)
Physical Therapy Treatment ?Patient Details ?Name: Erin Ochoa ?MRN: [id number] ?DOB: [birth year] ?Today's Date: 06/27/2021 ? ? ?History of Present Illness Saragrace Selke is a 81 y.o. female who presented s/p fall in her kitchen wtih immediate back pain. Imaging revealed  mild T8 and mild-moderate L1 compression fracture. Pt underwent T8 and L1 kyphoplasty on 5/10. Pt with medical history significant of osteoporosis, epilespy, HLD, recent left hip fracture s/p ORIF (3/23) ? ?  ?PT Comments  ? ? Pt remains limited by back pain during session at this time. Pt requires some assistance to perform bed mobility, having difficulty transitioning from sidelying to sitting due to back pain. Pt demonstrates the ability to transfer and ambulate with use of RW for limited household distances. PT attempts to perform Epley maneuver with patient however she is unable to tolerate long sitting and positional changes associated with the test. Pt reports no dizziness during session and reports often waking up dizzy without any movement. PT recommends outpatient vestibulare rehab once back pain has become less significant and after the patient has transitioned back to Independent living. PT initially recommends transition to ALF with HHPT pending further improvement in bed mobility quality.   ?Recommendations for follow up therapy are one component of a multi-disciplinary discharge planning process, led by the attending physician.  Recommendations may be updated based on patient status, additional functional criteria and insurance authorization. ? ?Follow Up Recommendations ? Home health PT (transition to ALF initially) ?  ?  ?Assistance Recommended at Discharge Intermittent Supervision/Assistance  ?Patient can return home with the following A little help with walking and/or transfers;A little help with bathing/dressing/bathroom;Assistance with cooking/housework;Assist for transportation;Help with stairs or ramp for entrance ?  ?Equipment  Recommendations ? None recommended by PT  ?  ?Recommendations for Other Services   ? ? ?  ?Precautions / Restrictions Precautions ?Precautions: Fall;Back ?Precaution Booklet Issued: No ?Precaution Comments: verbally reviewed back precautions ?Restrictions ?Weight Bearing Restrictions: No  ?  ? ?Mobility ? Bed Mobility ?Overal bed mobility: Needs Assistance ?Bed Mobility: Rolling, Sidelying to Sit ?Rolling: Min assist ?Sidelying to sit: Min assist ?  ?  ?  ?General bed mobility comments: pt requires physical assistance to complete roll and to initiate sidelying to sit ?  ? ?Transfers ?Overall transfer level: Needs assistance ?Equipment used: Rolling walker (2 wheels) ?Transfers: Sit to/from Stand ?Sit to Stand: Min guard ?  ?  ?  ?  ?  ?  ?  ? ?Ambulation/Gait ?Ambulation/Gait assistance: Min guard ?Gait Distance (Feet): 50 Feet ?Assistive device: Rolling walker (2 wheels) ?Gait Pattern/deviations: Step-through pattern ?Gait velocity: reduced ?Gait velocity interpretation: <1.31 ft/sec, indicative of household ambulator ?  ?General Gait Details: pt with slowed step-through gait, reduced stride length ? ? ?Stairs ?  ?  ?  ?  ?  ? ? ?Wheelchair Mobility ?  ? ?Modified Rankin (Stroke Patients Only) ?  ? ? ?  ?Balance Overall balance assessment: Needs assistance ?Sitting-balance support: No upper extremity supported, Feet supported ?Sitting balance-Leahy Scale: Good ?  ?  ?Standing balance support: Single extremity supported, Reliant on assistive device for balance ?Standing balance-Leahy Scale: Poor ?  ?  ?  ?  ?  ?  ?  ?  ?  ?  ?  ?  ?  ? ?  ?Cognition Arousal/Alertness: Awake/alert ?Behavior During Therapy: [facility] for tasks assessed/performed ?Overall Cognitive Status: Within Functional Limits for tasks assessed ?  ?  ?  ?  ?  ?  ?  ?  ?  ?  ?  ?  ?  ?  ?  ?  ?  ?  ?  ? ?  ?  Exercises   ? ?  ?General Comments General comments (skin integrity, edema, etc.): VSS on RA, mild tachycardia into low 100s. Pt does not report  dizziness during session ?  ?  ? ?Pertinent Vitals/Pain Pain Assessment ?Pain Assessment: 0-10 ?Pain Score: 7  ?Pain Location: back ?Pain Descriptors / Indicators: Aching ?Pain Intervention(s): Patient requesting pain meds-RN notified  ? ? ?Home Living   ?  ?  ?  ?  ?  ?  ?  ?  ?  ?   ?  ?Prior Function    ?  ?  ?   ? ?PT Goals (current goals can now be found in the care plan section) Acute Rehab PT Goals ?Patient Stated Goal: to reduce back pain ?Progress towards PT goals: Progressing toward goals ? ?  ?Frequency ? ? ? Min 3X/week ? ? ? ?  ?PT Plan Current plan remains appropriate  ? ? ?Co-evaluation   ?  ?  ?  ?  ? ?  ?AM-PAC PT "6 Clicks" Mobility   ?Outcome Measure ? Help needed turning from your back to your side while in a flat bed without using bedrails?: A Little ?Help needed moving from lying on your back to sitting on the side of a flat bed without using bedrails?: A Little ?Help needed moving to and from a bed to a chair (including a wheelchair)?: A Little ?Help needed standing up from a chair using your arms (e.g., wheelchair or bedside chair)?: A Little ?Help needed to walk in hospital room?: A Little ?Help needed climbing 3-5 steps with a railing? : Total ?6 Click Score: 16 ? ?  ?End of Session   ?Activity Tolerance: Patient limited by pain ?Patient left: in chair;with call bell/phone within reach;with chair alarm set ?Nurse Communication: Mobility status ?PT Visit Diagnosis: Other abnormalities of gait and mobility (R26.89);Muscle weakness (generalized) (M62.81);Pain ?Pain - part of body:  (back) ?  ? ? ?Time: [phone] ?PT Time Calculation (min) (ACUTE ONLY): 24 min ? ?Charges:  $Gait Training: 8-22 mins ?$Therapeutic Activity: 8-22 mins          ?          ? ?Arlyss Gandy, PT, DPT ?Acute Rehabilitation ?Pager: [phone] ?Office [phone] ? ? ? ?Arlyss Gandy ?06/27/2021, 8:57 AM ? ?

## 2021-06-27 NOTE — Progress Notes (Signed)
?PROGRESS NOTE ? ? ? ?Erin Ochoa  JQB:[id number] DOB: 03-22-1940 DOA: 06/21/2021 ?PCP: Mast, Man X, NP  ? ? ?Brief Narrative:  ?81 year old female from independent living facility, history of osteoporosis, seizure, hyperlipidemia and recent hip fracture status post ORIF left hip presented with an episode of fall.  In the emergency room she was afebrile.  Normotensive.  CT head negative.  He LS-spine showed T8 and L1 compression fractures.  Admitted for pain control and significant symptoms. ? ? ?Assessment & Plan: ?  ?Traumatic lumbar compression fracture, thoracic compression fracture: ?Patient continued to have significant pain and unable to mobilize.   ?Underwent balloon kyphoplasty T8 and L1 5/10.  Still significant pain.   ?Patient hesitant to use opiates, has mild intolerance.   ?We will start patient on pain medication as scheduled with Tylenol 1 g every 6 hours, hydrocodone 5 mg every 6 hours, muscle relaxants and IV morphine for more pain.   ?Aggressive bowel regimen with 1 dose of Fleet enema today.   ?Continue to work with PT OT.   ?Anticipate discharge with home health PT OT once pain control achieved.   ? ?Vertigo and dizziness: Currently with back pain and unable to mobilize much.  MRI brain was without any acute findings.  Carotid duplex is without any significant blockage.  Mild positive orthostatic symptoms. ?Continue to work with PT OT. ? ?History of seizure disorder: Currently on Tegretol that is continued. ? ?Hyperlipidemia: Statin. ? ?Anxiety: On Klonopin. ? ? ?DVT prophylaxis: enoxaparin (LOVENOX) injection 30 mg Start: 06/27/21 2200 ?Place TED hose Start: 06/24/21 0943 ? ? ?Code Status: Full code ?Family Communication: None at the bedside ?Disposition Plan: Status is: Inpatient ?Remains inpatient appropriate because: Significant back pain. ?  ? ? ?Consultants:  ?Interventional radiology ? ?Procedures:  ? kyphoplasty ? ?Antimicrobials:  ?Preop antibiotics.  Not on scheduled  medications. ? ? ?Subjective: ? ?Patient seen and examined.  Just mobilized with physical therapy and started having severe spasmodic pain on her back.  Hesitant to use any pain medications.  No bowel movement since last 5 days.  Denies any nausea vomiting. ? ?Objective: ?Vitals:  ? 06/26/21 1545 06/26/21 1915 06/26/21 2246 06/27/21 0753  ?BP: (!) 147/64 (!) 136/53 (!) 120/58 (!) 126/50  ?Pulse: 76 87 91 80  ?Resp: 16 17 19 20   ?Temp:  97.6 ?F (36.4 ?C) (!) 97.5 ?F (36.4 ?C) 98 ?F (36.7 ?C)  ?TempSrc:  Oral Axillary Oral  ?SpO2: 97% 97%  98%  ?Weight:      ?Height:      ? ? ?Intake/Output Summary (Last 24 hours) at 06/27/2021 1127 ?Last data filed at 06/27/2021 0900 ?Gross per 24 hour  ?Intake 348.88 ml  ?Output --  ?Net 348.88 ml  ? ?Filed Weights  ? 06/21/21 1126  ?Weight: 47.8 kg  ? ? ?Examination: ? ?General exam: Mild distress due to pain after mobility.  Anxious. ?Respiratory system: Clear to auscultation. Respiratory effort normal.  No added sounds. ?Cardiovascular system: S1 & S2 heard, RRR. No JVD, murmurs, rubs, gallops or clicks. No pedal edema. ?Gastrointestinal system: Abdomen is nondistended, soft and nontender. No organomegaly or masses felt. Normal bowel sounds heard. ?Central nervous system: Alert and oriented. No focal neurological deficits. ?Extremities: Symmetric 5 x 5 power. ?Skin: No rashes, lesions or ulcers ?Psychiatry: Judgement and insight appear normal. Mood & affect appropriate.  ? ? ? ?Data Reviewed: I have personally reviewed following labs and imaging studies ? ?CBC: ?Recent Labs  ?Lab 06/21/21 ?1145 06/26/21 ?08/26/21  ?  WBC 10.0 6.1  ?NEUTROABS 8.3* 3.9  ?HGB 13.0 13.1  ?HCT 39.7 38.2  ?MCV 97.3 96.5  ?PLT 215 194  ? ?Basic Metabolic Panel: ?Recent Labs  ?Lab 06/21/21 ?1145 06/22/21 ?0312 06/26/21 ?2563  ?NA 130* 133* 130*  ?K 4.2 3.7 4.0  ?CL 94* 100 97*  ?CO2 28 28 26   ?GLUCOSE 170* 155* 186*  ?BUN 13 9 13   ?CREATININE 0.60 0.52 0.54  ?CALCIUM 9.4 8.7* 9.2  ? ?GFR: ?Estimated Creatinine  Clearance: 42.3 mL/min (by C-G formula based on SCr of 0.54 mg/dL). ?Liver Function Tests: ?Recent Labs  ?Lab 06/21/21 ?1145  ?AST 30  ?ALT 32  ?ALKPHOS 99  ?BILITOT 0.5  ?PROT 6.6  ?ALBUMIN 4.3  ? ?No results for input(s): LIPASE, AMYLASE in the last 168 hours. ?No results for input(s): AMMONIA in the last 168 hours. ?Coagulation Profile: ?Recent Labs  ?Lab 06/26/21 ?08/21/21  ?INR 1.0  ? ?Cardiac Enzymes: ?No results for input(s): CKTOTAL, CKMB, CKMBINDEX, TROPONINI in the last 168 hours. ?BNP (last 3 results) ?No results for input(s): PROBNP in the last 8760 hours. ?HbA1C: ?No results for input(s): HGBA1C in the last 72 hours. ?CBG: ?No results for input(s): GLUCAP in the last 168 hours. ?Lipid Profile: ?No results for input(s): CHOL, HDL, LDLCALC, TRIG, CHOLHDL, LDLDIRECT in the last 72 hours. ?Thyroid Function Tests: ?No results for input(s): TSH, T4TOTAL, FREET4, T3FREE, THYROIDAB in the last 72 hours. ?Anemia Panel: ?No results for input(s): VITAMINB12, FOLATE, FERRITIN, TIBC, IRON, RETICCTPCT in the last 72 hours. ?Sepsis Labs: ?No results for input(s): PROCALCITON, LATICACIDVEN in the last 168 hours. ? ?No results found for this or any previous visit (from the past 240 hour(s)).  ? ? ? ? ? ?Radiology Studies: ?No results found. ? ? ? ? ? ?Scheduled Meds: ? atorvastatin  5 mg Oral Daily  ? calcium-vitamin D  1 tablet Oral Daily  ? carbamazepine  300 mg Oral QHS  ? clonazePAM  1 mg Oral QHS  ? enoxaparin (LOVENOX) injection  30 mg Subcutaneous Q24H  ? feeding supplement  1 Container Oral Daily  ? feeding supplement (GLUCERNA SHAKE)  237 mL Oral TID BM  ? lidocaine  1 patch Transdermal Q24H  ? polyethylene glycol  17 g Oral Daily  ? polyvinyl alcohol  1 drop Both Eyes QID  ? sodium phosphate  1 enema Rectal Once  ? Vitamin D (Ergocalciferol)  50,000 Units Oral Weekly  ? ?Continuous Infusions: ? sodium chloride 75 mL/hr at 06/27/21 0456  ? ? ? LOS: 5 days  ? ? ?Time spent: 35 minutes ? ? ? ?[id number], MD ?[facility] ?Pager [phone] ? ?

## 2021-06-27 NOTE — Progress Notes (Signed)
OT Cancellation Note ? ?Patient Details ?Name: Erin Ochoa ?MRN: [id number] ?DOB: [birth year] ? ? ?Cancelled Treatment:    Reason Eval/Treat Not Completed: Pain limiting ability to participate (pt reported 10/10 pain.) ?Nursing aware. ? ?Alphia Moh OTR/L  ?Acute Rehab Services  ?[phone] office number ?[phone] pager number ?  ?Alphia Moh ?06/27/2021, 10:20 AM ?

## 2021-06-28 DIAGNOSIS — S22000D Wedge compression fracture of unspecified thoracic vertebra, subsequent encounter for fracture with routine healing: Secondary | ICD-10-CM | POA: Diagnosis not present

## 2021-06-28 DIAGNOSIS — F5101 Primary insomnia: Secondary | ICD-10-CM | POA: Diagnosis not present

## 2021-06-28 DIAGNOSIS — R2689 Other abnormalities of gait and mobility: Secondary | ICD-10-CM | POA: Diagnosis not present

## 2021-06-28 DIAGNOSIS — Z7401 Bed confinement status: Secondary | ICD-10-CM | POA: Diagnosis not present

## 2021-06-28 DIAGNOSIS — W19XXXA Unspecified fall, initial encounter: Secondary | ICD-10-CM | POA: Diagnosis not present

## 2021-06-28 DIAGNOSIS — E785 Hyperlipidemia, unspecified: Secondary | ICD-10-CM | POA: Diagnosis not present

## 2021-06-28 DIAGNOSIS — S32000D Wedge compression fracture of unspecified lumbar vertebra, subsequent encounter for fracture with routine healing: Secondary | ICD-10-CM | POA: Diagnosis not present

## 2021-06-28 DIAGNOSIS — S22060D Wedge compression fracture of T7-T8 vertebra, subsequent encounter for fracture with routine healing: Secondary | ICD-10-CM | POA: Diagnosis not present

## 2021-06-28 DIAGNOSIS — G40822 Epileptic spasms, not intractable, without status epilepticus: Secondary | ICD-10-CM | POA: Diagnosis not present

## 2021-06-28 DIAGNOSIS — R7303 Prediabetes: Secondary | ICD-10-CM | POA: Diagnosis not present

## 2021-06-28 DIAGNOSIS — R42 Dizziness and giddiness: Secondary | ICD-10-CM | POA: Diagnosis not present

## 2021-06-28 DIAGNOSIS — Z9889 Other specified postprocedural states: Secondary | ICD-10-CM | POA: Diagnosis not present

## 2021-06-28 DIAGNOSIS — Z23 Encounter for immunization: Secondary | ICD-10-CM | POA: Diagnosis not present

## 2021-06-28 DIAGNOSIS — R29898 Other symptoms and signs involving the musculoskeletal system: Secondary | ICD-10-CM | POA: Diagnosis not present

## 2021-06-28 DIAGNOSIS — R531 Weakness: Secondary | ICD-10-CM | POA: Diagnosis not present

## 2021-06-28 DIAGNOSIS — S22060A Wedge compression fracture of T7-T8 vertebra, initial encounter for closed fracture: Secondary | ICD-10-CM | POA: Diagnosis not present

## 2021-06-28 DIAGNOSIS — S32010D Wedge compression fracture of first lumbar vertebra, subsequent encounter for fracture with routine healing: Secondary | ICD-10-CM | POA: Diagnosis not present

## 2021-06-28 DIAGNOSIS — D500 Iron deficiency anemia secondary to blood loss (chronic): Secondary | ICD-10-CM | POA: Diagnosis not present

## 2021-06-28 DIAGNOSIS — G40909 Epilepsy, unspecified, not intractable, without status epilepticus: Secondary | ICD-10-CM | POA: Diagnosis not present

## 2021-06-28 DIAGNOSIS — Z9181 History of falling: Secondary | ICD-10-CM | POA: Diagnosis not present

## 2021-06-28 DIAGNOSIS — R21 Rash and other nonspecific skin eruption: Secondary | ICD-10-CM | POA: Diagnosis not present

## 2021-06-28 DIAGNOSIS — Z8781 Personal history of (healed) traumatic fracture: Secondary | ICD-10-CM | POA: Diagnosis not present

## 2021-06-28 DIAGNOSIS — K5904 Chronic idiopathic constipation: Secondary | ICD-10-CM | POA: Diagnosis not present

## 2021-06-28 DIAGNOSIS — Z8669 Personal history of other diseases of the nervous system and sense organs: Secondary | ICD-10-CM | POA: Diagnosis not present

## 2021-06-28 DIAGNOSIS — M25552 Pain in left hip: Secondary | ICD-10-CM | POA: Diagnosis not present

## 2021-06-28 DIAGNOSIS — G4700 Insomnia, unspecified: Secondary | ICD-10-CM | POA: Diagnosis not present

## 2021-06-28 LAB — SURGICAL PATHOLOGY

## 2021-06-28 MED ORDER — HYDROCODONE-ACETAMINOPHEN 5-325 MG PO TABS: 1.0000 | ORAL_TABLET | ORAL | 0 refills | Status: AC | PRN

## 2021-06-28 MED ORDER — CLONAZEPAM 1 MG PO TABS: 1.0000 mg | ORAL_TABLET | Freq: Every day | ORAL | 0 refills | Status: DC

## 2021-06-28 MED ORDER — HYDROCORTISONE 1 % EX CREA
TOPICAL_CREAM | Freq: Three times a day (TID) | CUTANEOUS | Status: DC
  Filled 2021-06-28 (×2): qty 28

## 2021-06-28 MED ORDER — LIDOCAINE 5 % EX PTCH: 1.0000 | MEDICATED_PATCH | CUTANEOUS | 0 refills | Status: DC

## 2021-06-28 MED ORDER — LACTULOSE 10 GM/15ML PO SOLN
30.0000 g | Freq: Every day | ORAL | 0 refills | Status: DC | PRN
Start: 2021-06-28 — End: 2021-07-07

## 2021-06-28 MED ORDER — HYDROCORTISONE 1 % EX LOTN: TOPICAL_LOTION | Freq: Three times a day (TID) | CUTANEOUS | 0 refills | Status: DC

## 2021-06-28 MED ORDER — HYDROCORTISONE 1 % EX LOTN
TOPICAL_LOTION | Freq: Three times a day (TID) | CUTANEOUS | Status: DC
  Filled 2021-06-28: qty 118

## 2021-06-28 MED ORDER — METHOCARBAMOL 500 MG PO TABS: 500.0000 mg | ORAL_TABLET | Freq: Three times a day (TID) | ORAL | 0 refills | Status: AC | PRN

## 2021-06-28 MED FILL — Fentanyl Citrate Preservative Free (PF) Inj 100 MCG/2ML: INTRAMUSCULAR | Qty: 0.5 | Status: AC

## 2021-06-28 NOTE — Progress Notes (Signed)
Physical Therapy Treatment ?Patient Details ?Name: Erin Ochoa ?MRN: [id number] ?DOB: Oct 21, 1940 ?Today's Date: 06/28/2021 ? ? ?History of Present Illness Erin Ochoa is a 81 y.o. female who presented s/p fall in her kitchen wtih immediate back pain. Imaging revealed  mild T8 and mild-moderate L1 compression fracture. Pt underwent T8 and L1 kyphoplasty on 5/10. Pt with medical history significant of osteoporosis, epilespy, HLD, recent left hip fracture s/p ORIF (3/23) ? ?  ?PT Comments  ? ? Pt tolerates treatment well, ambulating for increased distances and not requiring physical assistance to mobilize at this time. Pt is able to perform bed mobility unassisted. Pt expresses concern over ability to make it to the restroom in time to urinate. PT provides suggestions of bedside commode use or sleeping in recliner chair to allow for accessing a commode more quickly. Pt will benefit from continued frequent mobilization in an effort to improve activity tolerance and restore independence.   ?Recommendations for follow up therapy are one component of a multi-disciplinary discharge planning process, led by the attending physician.  Recommendations may be updated based on patient status, additional functional criteria and insurance authorization. ? ?Follow Up Recommendations ? Home health PT (transition to ALF) ?  ?  ?Assistance Recommended at Discharge Intermittent Supervision/Assistance  ?Patient can return home with the following A little help with walking and/or transfers;A little help with bathing/dressing/bathroom;Assistance with cooking/housework;Assist for transportation;Help with stairs or ramp for entrance ?  ?Equipment Recommendations ? BSC/3in1  ?  ?Recommendations for Other Services   ? ? ?  ?Precautions / Restrictions Precautions ?Precautions: Fall;Back ?Precaution Booklet Issued: No ?Precaution Comments: verbally reviewed back precautions ?Restrictions ?Weight Bearing Restrictions: No  ?  ? ?Mobility ? Bed  Mobility ?Overal bed mobility: Needs Assistance ?Bed Mobility: Rolling, Sidelying to Sit, Sit to Sidelying ?Rolling: Supervision ?Sidelying to sit: Supervision ?  ?  ?Sit to sidelying: Supervision ?General bed mobility comments: increased time ?  ? ?Transfers ?Overall transfer level: Needs assistance ?Equipment used: Rolling walker (2 wheels) ?Transfers: Sit to/from Stand ?Sit to Stand: Supervision ?  ?  ?  ?  ?  ?General transfer comment: increased time ?  ? ?Ambulation/Gait ?Ambulation/Gait assistance: Supervision ?Gait Distance (Feet): 300 Feet ?Assistive device: Rolling walker (2 wheels) ?Gait Pattern/deviations: Step-through pattern ?Gait velocity: reduced ?Gait velocity interpretation: <1.8 ft/sec, indicate of risk for recurrent falls ?  ?General Gait Details: pt with slowed step-through gait, reduced gait speed ? ? ?Stairs ?  ?  ?  ?  ?  ? ? ?Wheelchair Mobility ?  ? ?Modified Rankin (Stroke Patients Only) ?  ? ? ?  ?Balance Overall balance assessment: Needs assistance ?Sitting-balance support: No upper extremity supported, Feet supported ?Sitting balance-Leahy Scale: Good ?  ?  ?Standing balance support: Single extremity supported, Bilateral upper extremity supported, Reliant on assistive device for balance ?Standing balance-Leahy Scale: Poor ?Standing balance comment: reaches to push door activation button when leaving unit ?  ?  ?  ?  ?  ?  ?  ?  ?  ?  ?  ?  ? ?  ?Cognition Arousal/Alertness: Awake/alert ?Behavior During Therapy: [facility] for tasks assessed/performed ?Overall Cognitive Status: Within Functional Limits for tasks assessed ?  ?  ?  ?  ?  ?  ?  ?  ?  ?  ?  ?  ?  ?  ?  ?  ?  ?  ?  ? ?  ?Exercises   ? ?  ?General Comments General comments (skin integrity, edema,  etc.): VSS on RA, BP stable. Pt reports lightheadedness/wooziness at rest, this improves with mobility ?  ?  ? ?Pertinent Vitals/Pain Pain Assessment ?Pain Assessment: Faces ?Faces Pain Scale: Hurts even more ?Pain Location: back ?Pain  Descriptors / Indicators: Grimacing ?Pain Intervention(s): Monitored during session  ? ? ?Home Living   ?  ?  ?  ?  ?  ?  ?  ?  ?  ?   ?  ?Prior Function    ?  ?  ?   ? ?PT Goals (current goals can now be found in the care plan section) Acute Rehab PT Goals ?Patient Stated Goal: to reduce back pain ?Progress towards PT goals: Progressing toward goals ? ?  ?Frequency ? ? ? Min 3X/week ? ? ? ?  ?PT Plan Current plan remains appropriate  ? ? ?Co-evaluation   ?  ?  ?  ?  ? ?  ?AM-PAC PT "6 Clicks" Mobility   ?Outcome Measure ? Help needed turning from your back to your side while in a flat bed without using bedrails?: A Little ?Help needed moving from lying on your back to sitting on the side of a flat bed without using bedrails?: A Little ?Help needed moving to and from a bed to a chair (including a wheelchair)?: A Little ?Help needed standing up from a chair using your arms (e.g., wheelchair or bedside chair)?: A Little ?Help needed to walk in hospital room?: A Little ?Help needed climbing 3-5 steps with a railing? : Total ?6 Click Score: 16 ? ?  ?End of Session   ?Activity Tolerance: Patient tolerated treatment well ?Patient left: in chair;with call bell/phone within reach;with chair alarm set ?Nurse Communication: Mobility status ?PT Visit Diagnosis: Other abnormalities of gait and mobility (R26.89);Muscle weakness (generalized) (M62.81);Pain ?Pain - part of body:  (back) ?  ? ? ?Time: 6378-5885 ?PT Time Calculation (min) (ACUTE ONLY): 38 min ? ?Charges:  $Gait Training: 23-37 mins ?$Therapeutic Activity: 8-22 mins          ?          ? ?Arlyss Gandy, PT, DPT ?Acute Rehabilitation ?Pager: [phone] ?Office [phone] ? ? ? ?Arlyss Gandy ?06/28/2021, 1:26 PM ? ?

## 2021-06-28 NOTE — Progress Notes (Signed)
Occupational Therapy Treatment ?Patient Details ?Name: Erin Ochoa ?MRN: [id number] ?DOB: 05/20/1940 ?Today's Date: 06/28/2021 ? ? ?History of present illness Arnecia Ector is a 81 y.o. female who presented s/p fall in her kitchen wtih immediate back pain. Imaging revealed  mild T8 and mild-moderate L1 compression fracture. Pt underwent T8 and L1 kyphoplasty on 5/10. Pt with medical history significant of osteoporosis, epilespy, HLD, recent left hip fracture s/p ORIF (3/23) ?  ?OT comments ? Patient in recliner upon entry. Patient stated she had recently completed PT. Patient agreeable to AE training with reacher and sock aide. Patient instructed on doffing socks with reacher and donning socks with sock aide and min/mod assist. Patient instructed on donning pants with reacher and began having increased pain requiring more rest breaks. Patient is expected to be discharged to ALF today.   ? ?Recommendations for follow up therapy are one component of a multi-disciplinary discharge planning process, led by the attending physician.  Recommendations may be updated based on patient status, additional functional criteria and insurance authorization. ?   ?Follow Up Recommendations ? Home health OT  ?  ?Assistance Recommended at Discharge Frequent or constant Supervision/Assistance  ?Patient can return home with the following ? A little help with walking and/or transfers;Help with stairs or ramp for entrance;Assist for transportation;A lot of help with bathing/dressing/bathroom ?  ?Equipment Recommendations ? BSC/3in1  ?  ?Recommendations for Other Services   ? ?  ?Precautions / Restrictions Precautions ?Precautions: Fall;Back ?Precaution Booklet Issued: No ?Precaution Comments: verbally reviewed back precautions ?Restrictions ?Weight Bearing Restrictions: No  ? ? ?  ? ?Mobility Bed Mobility ?Overal bed mobility: Needs Assistance ?  ?  ?  ?  ?  ?  ?General bed mobility comments: up in recliner ?  ? ?Transfers ?  ?  ?  ?  ?  ?   ?  ?  ?  ?General transfer comment: pt declined ?  ?  ?Balance   ?  ?  ?  ?  ?  ?  ?  ?  ?  ?  ?  ?  ?  ?  ?  ?  ?  ?  ?   ? ?ADL either performed or assessed with clinical judgement  ? ?ADL   ?  ?  ?  ?  ?  ?  ?  ?  ?  ?  ?Lower Body Dressing: Moderate assistance;Sitting/lateral leans ?Lower Body Dressing Details (indicate cue type and reason): AE training for LB dressing with reacher and sock aide ?  ?  ?  ?  ?  ?  ?  ?General ADL Comments: no standing due to complaints of back pain ?  ? ?Extremity/Trunk Assessment   ?  ?  ?  ?  ?  ? ?Vision   ?  ?  ?Perception   ?  ?Praxis   ?  ? ?Cognition Arousal/Alertness: Awake/alert ?Behavior During Therapy: [facility] for tasks assessed/performed ?Overall Cognitive Status: Within Functional Limits for tasks assessed ?  ?  ?  ?  ?  ?  ?  ?  ?  ?  ?  ?  ?  ?  ?  ?  ?General Comments: required reminder of BLT to recall back precautions ?  ?  ?   ?Exercises   ? ?  ?Shoulder Instructions   ? ? ?  ?General Comments    ? ? ?Pertinent Vitals/ Pain       Pain Assessment ?Pain Assessment: Faces ?Faces Pain Scale:  Hurts worst ?Pain Location: back ?Pain Descriptors / Indicators: Aching ?Pain Intervention(s): Limited activity within patient's tolerance, Monitored during session, Patient requesting pain meds-RN notified ? ?Home Living   ?  ?  ?  ?  ?  ?  ?  ?  ?  ?  ?  ?  ?  ?  ?  ?  ?  ?  ? ?  ?Prior Functioning/Environment    ?  ?  ?  ?   ? ?Frequency ? Min 2X/week  ? ? ? ? ?  ?Progress Toward Goals ? ?OT Goals(current goals can now be found in the care plan section) ? Progress towards OT goals: Progressing toward goals ? ?Acute Rehab OT Goals ?Patient Stated Goal: decreased pain ?OT Goal Formulation: With patient ?Time For Goal Achievement: 07/06/21 ?Potential to Achieve Goals: Good ?ADL Goals ?Pt Will Perform Lower Body Dressing: with min guard assist;with adaptive equipment;sit to/from stand ?Pt Will Transfer to Toilet: with supervision;ambulating ?Pt Will Perform Toileting - Clothing  Manipulation and hygiene: with supervision;sitting/lateral leans ?Additional ADL Goal #1: Pt will indep complete bed mobility with log roll to adhere to back precautions  ?Plan Discharge plan remains appropriate   ? ?Co-evaluation ? ? ?   ?  ?  ?  ?  ? ?  ?AM-PAC OT "6 Clicks" Daily Activity     ?Outcome Measure ? ? Help from another person eating meals?: None ?Help from another person taking care of personal grooming?: A Little ?Help from another person toileting, which includes using toliet, bedpan, or urinal?: A Lot ?Help from another person bathing (including washing, rinsing, drying)?: A Lot ?Help from another person to put on and taking off regular upper body clothing?: A Little ?Help from another person to put on and taking off regular lower body clothing?: A Lot ?6 Click Score: 16 ? ?  ?End of Session   ? ?OT Visit Diagnosis: Unsteadiness on feet (R26.81);Muscle weakness (generalized) (M62.81);History of falling (Z91.81);Other abnormalities of gait and mobility (R26.89);Pain ?  ?Activity Tolerance Patient limited by pain ?  ?Patient Left in chair;with call bell/phone within reach ?  ?Nurse Communication Patient requests pain meds ?  ? ?   ? ?Time: 8563-1497 ?OT Time Calculation (min): 23 min ? ?Charges: OT General Charges ?$OT Visit: 1 Visit ?OT Treatments ?$Self Care/Home Management : 23-37 mins ? ?Alfonse Flavors, OTA ?Acute Rehabilitation Services  ?Pager [phone] ?Office [phone] ? ? ?Ayliana Casciano Jeannett Senior ?06/28/2021, 12:44 PM ?

## 2021-06-28 NOTE — Discharge Summary (Signed)
Physician Discharge Summary  ?Lavonne ChickBennie Trethewey UJW:[id number]:[id number] DOB: 1941-01-04 DOA: 06/21/2021 ? ?PCP: Mast, Man X, NP ? ?Admit date: 06/21/2021 ?Discharge date: 06/28/2021 ? ?Admitted From: Independent living ?Disposition: Skilled nursing facility ? ?Recommendations for Outpatient Follow-up:  ?Follow up with PCP in 1-2 weeks ? ? ?Home Health: N/A ?Equipment/Devices: N/A ? ?Discharge Condition: Stable ?CODE STATUS: Full code ?Diet recommendation: Regular diet ? ?Discharge summary: ?81 year old female from independent living facility, history of osteoporosis, seizure, hyperlipidemia and recent hip fracture status post ORIF left hip presented with an episode of fall.  In the emergency room she was afebrile.  Normotensive.  CT head negative.  He LS-spine showed T8 and L1 compression fractures.  Admitted for pain control and significant difficulties with mobility.  ?  ?# Traumatic lumbar compression fracture, thoracic compression fracture with significant pain and debility: ?Patient continued to have significant pain and unable to mobilize.   ?Underwent balloon kyphoplasty T8 and L1 5/10 with some clinical response. ?Patient hesitant to use opiates, has mild intolerance.  She tolerated hydrocodone. ?Pain management with Tylenol, Robaxin and hydrocodone will be prescribed. ?Constipation is a major issue, she had good response to 1 dose of Fleet enema on 5/11. ?Continue stool softener.  MiraLAX 17 g daily.  If not relieved.  She should use lactulose 30 g. ?Work with PT OT.  Significantly debilitated.  Will benefit with short-term inpatient therapies at rehab before going back to independent living facility. ?  ?Vertigo and dizziness: Improved.  MRI brain was without any acute findings.  Carotid duplex is without any significant blockage.  Mild positive orthostatic symptoms. ?Continue to work with PT OT. ?  ?History of seizure disorder: Currently on Tegretol that is continued. ? ?Hyperlipidemia: Statin. ? ?Anxiety: On  Klonopin. ? ?Medically stable.  She can be transferred to skilled level of care when bed is available to continue rehab. ? ?Discharge Diagnoses:  ?Principal Problem: ?  Lumbar compression fracture ([name]) ?Active Problems: ?  Thoracic compression fracture ([name]) ?  Vertigo ?  Osteoporosis ?  Pre-diabetes ?  Anxiety ?  Hyperlipidemia ?  History of epilepsy ?  Fall ? ? ? ?Discharge Instructions ? ?Discharge Instructions   ? ? Diet - low sodium heart healthy   Complete by: As directed ?  ? Increase activity slowly   Complete by: As directed ?  ? No wound care   Complete by: As directed ?  ? ?  ? ?Allergies as of 06/28/2021   ? ?   Reactions  ? Cat Hair Extract Other (See Comments)  ? Patient states that it causes her eyes to swell shut  ? Cefaclor Other (See Comments)  ? Unknown reaction  ? Codeine Nausea Only  ? Fish Allergy Nausea Only  ? Iodinated Contrast Media Other (See Comments)  ? Unknown reaction  ? Other Other (See Comments)  ? Seeds -- Reflux  ? Penicillins Itching  ? Percocet [oxycodone-acetaminophen] Nausea And Vomiting  ? Red Dye Other (See Comments)  ? Reflux  ? Senna-docusate Sodium [sennosides-docusate Sodium] Other (See Comments)  ? Severe stomach pains  ? Shrimp (diagnostic) Nausea Only  ? Stomach pain  ? Tomato Other (See Comments)  ? Reflux  ? ?  ? ?  ?Medication List  ?  ? ?STOP taking these medications   ? ?Golytely 236 g solution ?Generic drug: polyethylene glycol ?  ? ?  ? ?TAKE these medications   ? ?acetaminophen 500 MG tablet ?Commonly known as: TYLENOL ?Take 1,000 mg by mouth every  8 (eight) hours as needed for mild pain. ?  ?atorvastatin 10 MG tablet ?Commonly known as: LIPITOR ?TAKE (1/2) TABLET DAILY. ?What changed: See the new instructions. ?  ?Boost Glucose Control Liqd ?Take 1 Package by mouth daily. ?  ?Caltrate 600+D Plus Minerals 600-800 MG-UNIT Chew ?Chew 2 each by mouth daily. ?  ?clonazePAM 1 MG tablet ?Commonly known as: KLONOPIN ?Take 1 tablet (1 mg total) by mouth at bedtime for  5 days. ?What changed: Another medication with the same name was removed. Continue taking this medication, and follow the directions you see here. ?  ?HYDROcodone-acetaminophen 5-325 MG tablet ?Commonly known as: NORCO/VICODIN ?Take 1 tablet by mouth every 4 (four) hours as needed for up to 5 days for moderate pain or severe pain. ?  ?lactulose 10 GM/15ML solution ?Commonly known as: CHRONULAC ?Take 45 mLs (30 g total) by mouth daily as needed for moderate constipation (if not relieved by Miralax). ?  ?lidocaine 5 % ?Commonly known as: LIDODERM ?Place 1 patch onto the skin daily. Remove & Discard patch within 12 hours or as directed by MD ?  ?methocarbamol 500 MG tablet ?Commonly known as: ROBAXIN ?Take 1 tablet (500 mg total) by mouth every 8 (eight) hours as needed for up to 7 days for muscle spasms. ?  ?polyethylene glycol powder 17 GM/SCOOP powder ?Commonly known as: GLYCOLAX/MIRALAX ?Take 17 g by mouth daily. ?  ?PRESERVISION AREDS PO ?Take 1 tablet by mouth 2 (two) times daily. ?  ?senna-docusate 8.6-50 MG tablet ?Commonly known as: Senokot-S ?Take 2 tablets by mouth 2 (two) times daily. ?  ?Soothe XP Soln ?Place 1 drop into both eyes in the morning, at noon, in the evening, and at bedtime. ?  ?TEGretol 200 MG tablet ?Generic drug: carbamazepine ?TAKE 1&1/2 TABLETS ONCE DAILY. ?What changed: See the new instructions. ?  ?Vitamin D (Ergocalciferol) 1.25 MG (50000 UNIT) Caps capsule ?Commonly known as: DRISDOL ?TAKE 1 CAPSULE WEEKLY. ?What changed: additional instructions ?  ? ?  ? ? ?Allergies  ?Allergen Reactions  ? Cat Hair Extract Other (See Comments)  ?  Patient states that it causes her eyes to swell shut  ? Cefaclor Other (See Comments)  ?  Unknown reaction  ? Codeine Nausea Only  ? Fish Allergy Nausea Only  ? Iodinated Contrast Media Other (See Comments)  ?  Unknown reaction ? ?  ? Other Other (See Comments)  ?  Seeds -- Reflux  ? Penicillins Itching  ? Percocet [Oxycodone-Acetaminophen] Nausea And  Vomiting  ? Red Dye Other (See Comments)  ?  Reflux  ? Senna-Docusate Sodium [Sennosides-Docusate Sodium] Other (See Comments)  ?  Severe stomach pains  ? Shrimp (Diagnostic) Nausea Only  ?  Stomach pain ?  ? Tomato Other (See Comments)  ?  Reflux  ? ? ?Consultations: ?Interventional radiology ? ? ?Procedures/Studies: ?DG Chest 2 View ? ?Result Date: 06/21/2021 ?CLINICAL DATA:  Lower back pain. EXAM: CHEST - 2 VIEW COMPARISON:  May 09, 2021. FINDINGS: The heart size and mediastinal contours are within normal limits. Both lungs are clear. Mild compression deformity is seen involving the T8 and L1 vertebral bodies concerning for fractures of indeterminate age. IMPRESSION: No acute cardiopulmonary abnormality seen. Mild compression deformity is seen involving the T8 and L1 vertebral bodies concerning for fractures of indeterminate age. Electronically Signed   By: Lupita Raider M.D.   On: 06/21/2021 12:21  ? ?DG Thoracic Spine 2 View ? ?Result Date: 06/21/2021 ?CLINICAL DATA:  Upper back pain  after fall. EXAM: THORACIC SPINE 2 VIEWS COMPARISON:  None Available. FINDINGS: Mild compression deformity of T8 vertebral body is noted concerning for fracture of indeterminate age. No spondylolisthesis is noted. Disc spaces are well-maintained. IMPRESSION: Mild compression deformity of T8 vertebral body is noted concerning for fracture of indeterminate age. MRI may be performed for further evaluation. Electronically Signed   By: Lupita Raider M.D.   On: 06/21/2021 12:23  ? ?DG Lumbar Spine Complete ? ?Result Date: 06/21/2021 ?CLINICAL DATA:  Lower back pain after fall. EXAM: LUMBAR SPINE - COMPLETE 4+ VIEW COMPARISON:  CT of April 29, 2018. FINDINGS: There is now noted mild to moderate compression deformity of L1 vertebral body concerning for fracture of indeterminate age. No spondylolisthesis is noted. Mild degenerative disc disease is noted at L2-3. Osteopenia is noted. IMPRESSION: Mild to moderate compression deformity of L1  vertebral body concerning for fracture of indeterminate age. MRI may be performed for further evaluation. Electronically Signed   By: Lupita Raider M.D.   On: 06/21/2021 12:22  ? ?CT Head Wo Contrast ? ?Result Date: 5/5

## 2021-06-28 NOTE — TOC Progression Note (Signed)
Transition of Care (TOC) - Progression Note  ? ? ?Patient Details  ?Name: Erin Ochoa ?MRN: [id number] ?Date of Birth: July 20, 1940 ? ?Transition of Care (TOC) CM/SW Contact  ?Eduard Roux, LCSW ?Phone Number: ?06/28/2021, 11:23 AM ? ?Clinical Narrative:    ? ?CSW called [facility]- left voice message- requested a return call back ? ? ? ?Expected Discharge Plan: Home w Home Health Services ?  ? ?Expected Discharge Plan and Services ?Expected Discharge Plan: Home w Home Health Services ?In-house Referral: Clinical Social Work ?Discharge Planning Services: CM Consult ?  ?Living arrangements for the past 2 months: Independent Living Facility ?                ?  ?  ?  ?  ?  ?  ?  ?  ?  ?  ? ? ?Social Determinants of Health (SDOH) Interventions ?  ? ?Readmission Risk Interventions ?   ? View : No data to display.  ?  ?  ?  ? ? ?

## 2021-06-28 NOTE — TOC Transition Note (Signed)
Transition of Care (TOC) - CM/SW Discharge Note ? ? ?Patient Details  ?Name: Erin Ochoa ?MRN: [id number] ?Date of Birth: 12-Mar-1940 ? ?Transition of Care (TOC) CM/SW Contact:  ?Eduard Roux, LCSW ?Phone Number: ?06/28/2021, 1:42 PM ? ? ?Clinical Narrative:    ? ?Patient will Discharge to: [facility] ?Discharge Date: 06/28/2021 ?Family Notified: unable to reach friend ?Transport By: Sharin Mons ? ?Per MD patient is ready for discharge. RN, patient, and facility notified of discharge. Discharge Summary sent to facility. RN given number for [phone]. Ambulance transport requested for patient.  ? ?Clinical Social Worker signing off. ? ?Antony Blackbird, MSW, LCSW ?Clinical Social Worker ? ? ? ? ?Final next level of care: Skilled Nursing Facility ?Barriers to Discharge: Barriers Resolved ? ? ?Patient Goals and CMS Choice ?  ?CMS Medicare.gov Compare Post Acute Care list provided to:: Patient ?  ? ?Discharge Placement ?  ?           ?  ?Patient to be transferred to facility by: [facility] ?Name of family member notified: unable to reach friend ?Patient and family notified of of transfer: 06/28/21 ? ?Discharge Plan and Services ?In-house Referral: Clinical Social Work ?Discharge Planning Services: CM Consult ?           ?  ?  ?  ?  ?  ?  ?  ?  ?  ?  ? ?Social Determinants of Health (SDOH) Interventions ?  ? ? ?Readmission Risk Interventions ?   ? View : No data to display.  ?  ?  ?  ? ? ? ? ? ?

## 2021-06-28 NOTE — Progress Notes (Signed)
Mobility Specialist: Progress Note ? ? 06/28/21 1537  ?Mobility  ?Activity  ?(Stood at chair)  ?Level of Assistance Standby assist, set-up cues, supervision of patient - no hands on  ?Assistive Device Front wheel walker  ?Activity Response Tolerated fair  ?$Mobility charge 1 Mobility  ? ?Pre-Mobility:  ?   Sitting:110/71 (84) BP ?During Mobility:  ?   Standing: 118/69 (84) BP ?   Standing 2 min: 140/69 (89) BP ? ?Pt received in the chair with c/o dizziness and nausea. Pt agreeable to standing BP, stating her dizziness progressed worse. BP stable throughout. Pt declining further ambulation attempts in the room. Back to the chair with call bell and phone at her side. Chair alarm is on.  ? ?Erin Ochoa ?Mobility Specialist ?Mobility Specialist 5 North: [phone] ?Mobility Specialist 6 North: [phone] ? ?

## 2021-06-28 NOTE — [place] FL2 (Addendum)
?[place] MEDICAID FL2 LEVEL OF CARE SCREENING TOOL  ?  ? ?IDENTIFICATION  ?Patient Name: ?Erin Ochoa Birthdate: 1940/12/03 Sex: female Admission Date (Current Location): ?06/21/2021  ?[place] Number: ? [place] ?  Facility and Address:  ?[facility], [address] [place], [place] [address] ?     Provider Number: ?[id number]  ?Attending Physician Name and Address:  ?Dorcas Carrow, MD ? Relative Name and Phone Number:  ?  ?   ?Current Level of Care: ?Hospital Recommended Level of Care: ?SNF Prior Approval Number: ?  ? ?Date Approved/Denied: ?  PASRR Number: ?  ? ?Discharge Plan: ?SNF ?  ? ?Current Diagnoses: ?Patient Active Problem List  ? Diagnosis Date Noted  ? Fall 06/22/2021  ? Lumbar compression fracture ([name]) 06/21/2021  ? Thoracic compression fracture ([name]) 06/21/2021  ? History of epilepsy 06/21/2021  ? Vertigo 06/21/2021  ? Blood loss anemia 05/14/2021  ? Left hip pain 05/09/2021  ? Thyroid nodule 05/09/2021  ? Left-sided chest wall pain 05/09/2021  ? Macular degeneration 04/11/2021  ? Dysuria 11/20/2020  ? Dry skin dermatitis 11/20/2020  ? Hyperlipidemia 03/10/2019  ? Weight loss 07/29/2018  ? Abnormal CT scan, pelvis 06/17/2018  ? Erosive gastropathy 03/25/2018  ? Renal cyst, right 01/13/2018  ? Liver cyst 01/13/2018  ? Anxiety 06/04/2017  ? High risk medications (not anticoagulants) long-term use 06/04/2017  ? Pre-diabetes 02/03/2017  ? Cataract 10/30/2016  ? H/O colonoscopy 10/30/2016  ? Osteoporosis 10/30/2016  ? Constipation 10/02/2016  ? Rosacea 01/24/2016  ? Gastroesophageal reflux disease without esophagitis 01/24/2016  ? Epilepsy without status epilepticus, not intractable ([name]) 08/24/2014  ? Insomnia 08/24/2014  ? ? ?Orientation RESPIRATION BLADDER Height & Weight   ?  ?Self, Time, Situation, Place ? Normal External catheter, Continent Weight: 105 lb 6.1 oz (47.8 kg) ?Height:  5\' 7"  (170.2 cm)  ?BEHAVIORAL SYMPTOMS/MOOD NEUROLOGICAL BOWEL NUTRITION STATUS   ?    Continent Diet (Low salt diet)  ?AMBULATORY STATUS COMMUNICATION OF NEEDS Skin   ?Limited Assist Verbally Normal ?  ?  ?  ?    ?     ?     ? ? ?Personal Care Assistance Level of Assistance  ?Bathing, Feeding, Dressing Bathing Assistance: Limited assistance ?Feeding assistance: Independent ?Dressing Assistance: Limited assistance ?   ? ?Functional Limitations Info  ?Sight, Hearing, Speech Sight Info: Adequate ?Hearing Info: Adequate ?Speech Info: Adequate  ? ? ?SPECIAL CARE FACTORS FREQUENCY  ?PT (By licensed PT), OT (By licensed OT)   ?  ?PT Frequency: 5x per week ?OT Frequency: 5x per week ?  ?  ?  ?   ? ? ?Contractures Contractures Info: Not present  ? ? ?Additional Factors Info  ?Code Status, Allergies Code Status Info: FULL ?Allergies Info: Cat Hair Extract,Cefaclor,Fish Allergy,Iodinated Contrast,Penicillins,Percocet,Red Dye,Senna-docusate Sodium ,Shrimp,Tomato ?  ?  ?  ?   ? ?Current Medications (06/28/2021):  This is the current hospital active medication list ?Current Facility-Administered Medications  ?Medication Dose Route Frequency Provider Last Rate Last Admin  ? 0.9 %  sodium chloride infusion   Intravenous Continuous 08/28/2021, MD 75 mL/hr at 06/27/21 1822 New Bag at 06/27/21 1822  ? acetaminophen (TYLENOL) tablet 650 mg  650 mg Oral Q6H PRN 08/27/21 T, MD   650 mg at 06/25/21 2131  ? atorvastatin (LIPITOR) tablet 5 mg  5 mg Oral Daily Tu, Ching T, DO   5 mg at 06/28/21 08/28/21  ? calcium-vitamin D (OSCAL WITH D) 500-5  MG-MCG per tablet 1 tablet  1 tablet Oral Daily Kathlen ModyAkula, Vijaya, MD   1 tablet at 06/27/21 0856  ? carbamazepine (TEGRETOL) tablet 300 mg  300 mg Oral QHS Kathlen ModyAkula, Vijaya, MD   300 mg at 06/27/21 2251  ? clonazePAM (KLONOPIN) tablet 1 mg  1 mg Oral QHS Tu, Ching T, DO   1 mg at 06/27/21 2249  ? enoxaparin (LOVENOX) injection 30 mg  30 mg Subcutaneous Q24H Ralene Muskraturpin, Pamela, PA-C   30 mg at 06/27/21 2249  ? feeding supplement (BOOST / RESOURCE BREEZE) liquid 1 Container  1 Container  Oral Daily Kathlen ModyAkula, Vijaya, MD   1 Container at 06/25/21 1440  ? feeding supplement (GLUCERNA SHAKE) (GLUCERNA SHAKE) liquid 237 mL  237 mL Oral TID BM Kathlen ModyAkula, Vijaya, MD   237 mL at 06/27/21 1426  ? HYDROcodone-acetaminophen (NORCO/VICODIN) 5-325 MG per tablet 1 tablet  1 tablet Oral Q4H PRN Dorcas CarrowGhimire, Kuber, MD   1 tablet at 06/28/21 0837  ? lidocaine (LIDODERM) 5 % 1 patch  1 patch Transdermal Q24H Tu, Ching T, DO   1 patch at 06/27/21 2248  ? meclizine (ANTIVERT) tablet 12.5 mg  12.5 mg Oral BID PRN Tu, Ching T, DO      ? methocarbamol (ROBAXIN) tablet 500 mg  500 mg Oral Q8H PRN Kathlen ModyAkula, Vijaya, MD   500 mg at 06/28/21 0837  ? morphine (PF) 2 MG/ML injection 0.5 mg  0.5 mg Intravenous Q4H PRN Tu, Ching T, DO   0.5 mg at 06/26/21 1833  ? ondansetron (ZOFRAN) injection 4 mg  4 mg Intravenous Q6H PRN Tu, Ching T, DO   4 mg at 06/26/21 1320  ? polyethylene glycol (MIRALAX / GLYCOLAX) packet 17 g  17 g Oral Daily Kathlen ModyAkula, Vijaya, MD   17 g at 06/28/21 0837  ? polyvinyl alcohol (LIQUIFILM TEARS) 1.4 % ophthalmic solution 1 drop  1 drop Both Eyes QID Kathlen ModyAkula, Vijaya, MD   1 drop at 06/28/21 0836  ? Vitamin D (Ergocalciferol) (DRISDOL) capsule 50,000 Units  50,000 Units Oral Weekly Kathlen ModyAkula, Vijaya, MD   50,000 Units at 06/25/21 1527  ? ? ? ?Discharge Medications: ? ?acetaminophen 500 MG tablet ?Commonly known as: TYLENOL ?Take 1,000 mg by mouth every 8 (eight) hours as needed for mild pain. ?   ?atorvastatin 10 MG tablet ?Commonly known as: LIPITOR ?TAKE (1/2) TABLET DAILY. ?What changed: See the new instructions. ?   ?Boost Glucose Control Liqd ?Take 1 Package by mouth daily. ?   ?Caltrate 600+D Plus Minerals 600-800 MG-UNIT Chew ?Chew 2 each by mouth daily. ?   ?clonazePAM 1 MG tablet ?Commonly known as: KLONOPIN ?Take 1 tablet (1 mg total) by mouth at bedtime for 5 days. ?What changed: Another medication with the same name was removed. Continue taking this medication, and follow the directions you see here. ?    ?HYDROcodone-acetaminophen 5-325 MG tablet ?Commonly known as: NORCO/VICODIN ?Take 1 tablet by mouth every 4 (four) hours as needed for up to 5 days for moderate pain or severe pain. ?   ?lactulose 10 GM/15ML solution ?Commonly known as: CHRONULAC ?Take 45 mLs (30 g total) by mouth daily as needed for moderate constipation (if not relieved by Miralax). ?   ?lidocaine 5 % ?Commonly known as: LIDODERM ?Place 1 patch onto the skin daily. Remove & Discard patch within 12 hours or as directed by MD ?   ?methocarbamol 500 MG tablet ?Commonly known as: ROBAXIN ?Take 1 tablet (500 mg total) by mouth every 8 (  eight) hours as needed for up to 7 days for muscle spasms. ?   ?polyethylene glycol powder 17 GM/SCOOP powder ?Commonly known as: GLYCOLAX/MIRALAX ?Take 17 g by mouth daily. ?   ?PRESERVISION AREDS PO ?Take 1 tablet by mouth 2 (two) times daily. ?   ?senna-docusate 8.6-50 MG tablet ?Commonly known as: Senokot-S ?Take 2 tablets by mouth 2 (two) times daily. ?   ?Soothe XP Soln ?Place 1 drop into both eyes in the morning, at noon, in the evening, and at bedtime. ?   ?TEGretol 200 MG tablet ?Generic drug: carbamazepine ?TAKE 1&1/2 TABLETS ONCE DAILY. ?What changed: See the new instructions. ?   ?Vitamin D (Ergocalciferol) 1.25 MG (50000 UNIT) Caps capsule ?Commonly known as: DRISDOL ?TAKE 1 CAPSULE WEEKLY. ?What changed: additional instructions  ? ?Relevant Imaging Results: ? ?Relevant Lab Results: ? ? ?Additional Information ?SSN [id number] ? ?Eduard Roux, LCSW ? ? ? ? ?

## 2021-06-28 NOTE — Progress Notes (Signed)
IV removed, report called to Tammy at [facility]. ?

## 2021-07-01 ENCOUNTER — Telehealth: Payer: Self-pay

## 2021-07-01 ENCOUNTER — Non-Acute Institutional Stay (SKILLED_NURSING_FACILITY): Payer: Medicare Other | Admitting: Orthopedic Surgery

## 2021-07-01 DIAGNOSIS — D500 Iron deficiency anemia secondary to blood loss (chronic): Secondary | ICD-10-CM

## 2021-07-01 DIAGNOSIS — K5904 Chronic idiopathic constipation: Secondary | ICD-10-CM | POA: Diagnosis not present

## 2021-07-01 DIAGNOSIS — S22060D Wedge compression fracture of T7-T8 vertebra, subsequent encounter for fracture with routine healing: Secondary | ICD-10-CM | POA: Diagnosis not present

## 2021-07-01 DIAGNOSIS — R21 Rash and other nonspecific skin eruption: Secondary | ICD-10-CM

## 2021-07-01 DIAGNOSIS — S32010D Wedge compression fracture of first lumbar vertebra, subsequent encounter for fracture with routine healing: Secondary | ICD-10-CM | POA: Diagnosis not present

## 2021-07-01 DIAGNOSIS — G40822 Epileptic spasms, not intractable, without status epilepticus: Secondary | ICD-10-CM | POA: Diagnosis not present

## 2021-07-01 DIAGNOSIS — Z8781 Personal history of (healed) traumatic fracture: Secondary | ICD-10-CM

## 2021-07-01 DIAGNOSIS — Z9889 Other specified postprocedural states: Secondary | ICD-10-CM | POA: Diagnosis not present

## 2021-07-01 DIAGNOSIS — F5101 Primary insomnia: Secondary | ICD-10-CM | POA: Diagnosis not present

## 2021-07-01 NOTE — Progress Notes (Signed)
?Location:  [facility] ?Nursing Home Room Number: N034/A ?Place of Service:  SNF (31) ?Provider: Octavia HeirAmy E Brendyn Mclaren, NP ? ?Patient Care Team: ?Mast, Man X, NP as PCP - General (Internal Medicine) ?Mast, Man X, NP as Nurse Practitioner (Internal Medicine) ? ?Extended Emergency Contact Information ?Primary Emergency Contact: [name],[name] ? [place] ?Mobile Phone: [phone] ?Relation: Friend ? ?Code Status:  Full code ?Goals of care: Advanced Directive information ? ?  07/01/2021  ?  2:17 PM  ?Advanced Directives  ?Does Patient Have a Medical Advance Directive? Yes  ?Type of Advance Directive Healthcare Power of Attorney  ?Does patient want to make changes to medical advance directive? No - Patient declined  ?Copy of Healthcare Power of Attorney in Chart? Yes - validated most recent copy scanned in chart (See row information)  ? ? ? ?Chief Complaint  ?Patient presents with  ? Hospitalization Follow-up  ?  Hospital follow up  ? ? ?HPI:  ?Pt is a 81 y.o. female seen today for an acute visit for rash.  ? ?Prior to hospitalization she lived in [place] due to [place] hip fracture 05/09/2021. Underwent ORIF 05/09/2021.  ? ?[facility]. Hospitalized 05/05- 05/12 due to lumbar compression fracture related to mechanical fall. 05/07 MRI spine revealed acute T8 and L1 compression fractures, left subarticular disc protrusion at L5-S1 also noted. Due to significant pain and unable to walk she underwent balloon kyphoplasty 05/10, tolerated well. She was given hydrocodone for pain. 05/11 she experience constipation and responded well to fleet enema. Discharged to SNF for PT/OT. She was advised to take miralax daily for constipation. Prescriptions for tylenol, robaxin, hydrocodone and lidocaine patches also given for back pain.  ? ?Today, nursing reports red rash outlining where lidocaine patches have been placed. Rash itching and burning at times. She is  very sensitive to medications. Treatment options discussed. Plan to avoid benadryl. Will try zyrtec qhs x 10 days and hydrocortisone cream. Back pain is tolerable with tylenol. Plan to discontinue lidocaine patches.  ? ?LBM 05/15. Remains on miralax. She also reports drinking prune juice.  ? ?Ambulating with walker, no recent falls.  ? ?[place] when [place].  ? ? ?Past Medical History:  ?Diagnosis Date  ? Anxiety   ? Cataract   ? Chronic constipation   ? Depression   ? Osteoporosis   ? Seizures ([name])   ? epilepsy  last seizure 1977  ? ?Past Surgical History:  ?Procedure Laterality Date  ? ABDOMINAL HYSTERECTOMY  1995  ? Dr. Carlis AbbottVan Fletcher  ? CATARACT EXTRACTION Bilateral 12/2016  ? COLONOSCOPY  2012  ? patchy increased intraepithelial lymphocytes - draelos  ? ESOPHAGOGASTRODUODENOSCOPY    ? multiple  ? FISSURECTOMY    ? INTRAMEDULLARY (IM) NAIL INTERTROCHANTERIC Left 05/09/2021  ? Procedure: INTRAMEDULLARY (IM) NAIL INTERTROCHANTRIC;  Surgeon: Cammy Copaean, Gregory Scott, MD;  [facility];  Service: Orthopedics;  Laterality: Left;  ? IR KYPHO EA ADDL LEVEL THORACIC OR LUMBAR  06/26/2021  ? IR KYPHO THORACIC WITH BONE BIOPSY  06/26/2021  ? WRIST SURGERY Left   ? due to fracture  ? ? ?Allergies  ?Allergen Reactions  ? Cat Hair Extract Other (See Comments)  ?  Patient states that it causes her eyes to swell shut  ? Cefaclor Other (See Comments)  ?  Unknown reaction  ? Codeine Nausea Only  ? Fish Allergy Nausea Only  ? Iodinated  Contrast Media Other (See Comments)  ?  Unknown reaction ? ?  ? Other Other (See Comments)  ?  Seeds -- Reflux  ? Penicillins Itching  ? Percocet [Oxycodone-Acetaminophen] Nausea And Vomiting  ? Red Dye Other (See Comments)  ?  Reflux  ? Senna-Docusate Sodium [Sennosides-Docusate Sodium] Other (See Comments)  ?  Severe stomach pains  ? Shrimp (Diagnostic) Nausea Only  ?  Stomach pain ?  ? Tomato Other (See Comments)  ?  Reflux  ? ? ?Outpatient Encounter Medications as of  07/01/2021  ?Medication Sig  ? acetaminophen (TYLENOL) 500 MG tablet Take 1,000 mg by mouth every 8 (eight) hours as needed for mild pain.  ? Artificial Tear Solution (SOOTHE XP) SOLN Place 1 drop into both eyes in the morning, at noon, in the evening, and at bedtime.  ? atorvastatin (LIPITOR) 10 MG tablet Take by mouth daily. 1/2 tablet  ? Calcium Carbonate-Vit D-Min (CALTRATE 600+D PLUS MINERALS) 600-800 MG-UNIT CHEW Chew 2 each by mouth daily.  ? carbamazepine (TEGRETOL) 200 MG tablet Take 200 mg by mouth daily. 1 & 1/2 tablets  ? clonazePAM (KLONOPIN) 1 MG tablet Take 1 tablet (1 mg total) by mouth at bedtime for 5 days.  ? HYDROcodone-acetaminophen (NORCO/VICODIN) 5-325 MG tablet Take 1 tablet by mouth every 4 (four) hours as needed for up to 5 days for moderate pain or severe pain.  ? hydrocortisone 1 % lotion Apply topically 3 (three) times daily.  ? lactulose (CHRONULAC) 10 GM/15ML solution Take 45 mLs (30 g total) by mouth daily as needed for moderate constipation (if not relieved by Miralax).  ? lidocaine (LIDODERM) 5 % Place 1 patch onto the skin daily. Remove & Discard patch within 12 hours or as directed by MD  ? methocarbamol (ROBAXIN) 500 MG tablet Take 1 tablet (500 mg total) by mouth every 8 (eight) hours as needed for up to 7 days for muscle spasms.  ? Multiple Vitamins-Minerals (PRESERVISION AREDS PO) Take 1 tablet by mouth 2 (two) times daily.  ? Nutritional Supplements (BOOST GLUCOSE CONTROL PO) Take 1 Package by mouth daily.  ? polyethylene glycol powder (GLYCOLAX/MIRALAX) 17 GM/SCOOP powder Take 17 g by mouth daily.  ? senna-docusate (SENOKOT-S) 8.6-50 MG tablet Take 2 tablets by mouth 2 (two) times daily.  ? [DISCONTINUED] atorvastatin (LIPITOR) 10 MG tablet TAKE (1/2) TABLET DAILY. (Patient taking differently: Take 5 mg by mouth daily.)  ? [DISCONTINUED] Nutritional Supplements (BOOST GLUCOSE CONTROL) LIQD Take 1 Package by mouth daily.  ? [DISCONTINUED] TEGRETOL 200 MG tablet TAKE 1&1/2  TABLETS ONCE DAILY. (Patient taking differently: Take 300 mg by mouth daily. Brand name only)  ? [DISCONTINUED] Vitamin D, Ergocalciferol, (DRISDOL) 1.25 MG (50000 UNIT) CAPS capsule TAKE 1 CAPSULE WEEKLY. (Patient taking differently: Take 50,000 Units by mouth once a week. Tuesdays)  ? ?No facility-administered encounter medications on file as of 07/01/2021.  ? ? ?Review of Systems  ?Constitutional:  Negative for activity change, appetite change, chills, fatigue and fever.  ?HENT:  Negative for congestion and trouble swallowing.   ?Eyes:  Negative for visual disturbance.  ?Respiratory:  Negative for cough, shortness of breath and wheezing.   ?Cardiovascular:  Negative for chest pain and leg swelling.  ?Gastrointestinal:  Positive for constipation. Negative for abdominal distention, abdominal pain, diarrhea, nausea and vomiting.  ?Genitourinary:  Negative for dysuria and hematuria.  ?Musculoskeletal:  Positive for arthralgias, back pain and gait problem.  ?Skin:  Positive for rash.  ?Neurological:  Positive for weakness. Negative for  dizziness and headaches.  ?Psychiatric/Behavioral:  Negative for confusion and dysphoric mood. The patient is not nervous/anxious.   ? ?Immunization History  ?Administered Date(s) Administered  ? Fluad Quad(high Dose 65+) 12/03/2020  ? Influenza Whole 11/19/2017  ? Influenza, High Dose Seasonal PF 11/26/2016, 12/01/2018, 11/30/2019  ? Influenza-Unspecified 11/18/2014, 11/29/2015  ? Moderna Covid-19 Vaccine Bivalent Booster 26yrs & up 11/06/2020  ? Moderna SARS-COV2 Booster Vaccination 12/27/2019, 06/26/2020  ? Moderna Sars-Covid-2 Vaccination 02/21/2019, 03/21/2019  ? Pneumococcal Conjugate-13 09/24/2017  ? Pneumococcal Polysaccharide-23 12/30/2012, 09/18/2018  ? Tdap 08/20/2017  ? Zoster Recombinat (Shingrix) 07/21/2017, 12/08/2017  ? Zoster, Live 05/28/2007  ? ?Pertinent  Health Maintenance Due  ?Topic Date Due  ? MAMMOGRAM  08/13/2017  ? URINE MICROALBUMIN  04/19/2021  ? INFLUENZA  VACCINE  09/17/2021  ? HEMOGLOBIN A1C  10/26/2021  ? FOOT EXAM  04/11/2022  ? OPHTHALMOLOGY EXAM  05/07/2022  ? DEXA SCAN  Completed  ? ? ?  06/25/2021  ?  7:30 AM 06/25/2021  ?  8:54 PM 06/26/2021  ?  8:37 PM 5/11

## 2021-07-01 NOTE — Telephone Encounter (Signed)
Transition Care Management Unsuccessful Follow-up Telephone Call ? ?Date of discharge and from where:  06/28/2021; [facility] ? ? ?Attempts:  1st Attempt ? ?Reason for unsuccessful TCM follow-up call:  Unable to reach patient ?Unable to be reached ;due to release into a skilled nursing facility. ? ? ? ? ?

## 2021-07-02 ENCOUNTER — Encounter: Payer: Self-pay | Admitting: Internal Medicine

## 2021-07-02 ENCOUNTER — Non-Acute Institutional Stay (SKILLED_NURSING_FACILITY): Payer: Medicare Other | Admitting: Internal Medicine

## 2021-07-02 DIAGNOSIS — D500 Iron deficiency anemia secondary to blood loss (chronic): Secondary | ICD-10-CM | POA: Diagnosis not present

## 2021-07-02 DIAGNOSIS — G40822 Epileptic spasms, not intractable, without status epilepticus: Secondary | ICD-10-CM

## 2021-07-02 DIAGNOSIS — Z9889 Other specified postprocedural states: Secondary | ICD-10-CM

## 2021-07-02 DIAGNOSIS — R42 Dizziness and giddiness: Secondary | ICD-10-CM | POA: Diagnosis not present

## 2021-07-02 DIAGNOSIS — K5904 Chronic idiopathic constipation: Secondary | ICD-10-CM

## 2021-07-02 DIAGNOSIS — Z8781 Personal history of (healed) traumatic fracture: Secondary | ICD-10-CM | POA: Diagnosis not present

## 2021-07-02 DIAGNOSIS — G4700 Insomnia, unspecified: Secondary | ICD-10-CM | POA: Diagnosis not present

## 2021-07-02 DIAGNOSIS — R21 Rash and other nonspecific skin eruption: Secondary | ICD-10-CM

## 2021-07-02 DIAGNOSIS — E785 Hyperlipidemia, unspecified: Secondary | ICD-10-CM | POA: Diagnosis not present

## 2021-07-02 NOTE — Progress Notes (Signed)
Provider:  Einar Crow MD Location:   [facility] Room Number: [id number] Place of Service:  SNF (31)  PCP: Mast, Man X, NP Patient Care Team: Mast, Man X, NP as PCP - General (Internal Medicine) Mast, Man X, NP as Nurse Practitioner (Internal Medicine)  Extended Emergency Contact Information Primary Emergency Contact: Charlies Constable [place] Mobile Phone: [phone] Relation: Friend  Code Status: Full Code Goals of Care: Advanced Directive information    07/02/2021    1:36 PM  Advanced Directives  Does Patient Have a Medical Advance Directive? Yes  Type of Advance Directive Healthcare Power of Attorney  Does patient want to make changes to medical advance directive? No - Patient declined  Copy of Healthcare Power of Attorney in Chart? Yes - validated most recent copy scanned in chart (See row information)      Chief Complaint  Patient presents with   Readmit To SNF    Re admission to SNF    HPI: Patient is a 81 y.o. female seen today for Readmission to Therapy  Admitted in the hospital again after fall in her apartment and sustaining T 8 and L 1 Compression fracture underwent Kyphoplasty  Patient has a history of seizures on Tegretol, constipation, HLD, osteoporosis, insomnia and erosive gastropathy. She was also Admitted in the hospital from 03/23 to 03/27 for left hip fracture after a mechanical fall ORIF on 03/23 After ORIF patient was in SNF and underwent Therapy Was eventually discharged to her apartment She says next day she was fixing herself some food and felt dizzy and felt on the floor and then felt pain in her back She had MRI of head negative for any acute changes General Atropy Xray of back showed Acute Compression fracture of T 8 and L1 Underwent Kyphoplasty on 05/10  Now back in SNF Her main issue is Rash in her Back due to Lidocaine patches Was started on Hydrocortisone Cream which has helped Also Having pain in  her back Main issue still is feeling dizzy. Not orthostatic ? Vertigo not sure She keep saying that her head is swimming   Past Medical History:  Diagnosis Date   Anxiety    Cataract    Chronic constipation    Depression    Osteoporosis    Seizures ([name])    epilepsy  last seizure 1977   Past Surgical History:  Procedure Laterality Date   ABDOMINAL HYSTERECTOMY  1995   Dr. Carlis Abbott   CATARACT EXTRACTION Bilateral 12/2016   COLONOSCOPY  2012   patchy increased intraepithelial lymphocytes - draelos   ESOPHAGOGASTRODUODENOSCOPY     multiple   FISSURECTOMY     INTRAMEDULLARY (IM) NAIL INTERTROCHANTERIC Left 05/09/2021   Procedure: INTRAMEDULLARY (IM) NAIL INTERTROCHANTRIC;  Surgeon: Cammy Copa, MD;  Location: [facility] OR;  Service: Orthopedics;  Laterality: Left;   IR KYPHO EA ADDL LEVEL THORACIC OR LUMBAR  06/26/2021   IR KYPHO THORACIC WITH BONE BIOPSY  06/26/2021   WRIST SURGERY Left    due to fracture    reports that she quit smoking about 40 years ago. Her smoking use included cigarettes. She started smoking about 65 years ago. She smoked an average of 1 pack per day. She has never used smokeless tobacco. She reports that she does not drink alcohol and does not use drugs. Social History   Socioeconomic History   Marital status: Widowed    Spouse name: Not on file   Number of children: 0  Years of education: Not on file   Highest education level: Some college, no degree  Occupational History   Occupation: Retired  Tobacco Use   Smoking status: Former    Packs/day: 1.00    Types: Cigarettes    Start date: 02/18/1956    Quit date: 02/17/1981    Years since quitting: 40.3   Smokeless tobacco: Never  Vaping Use   Vaping Use: Never used  Substance and Sexual Activity   Alcohol use: No    Alcohol/week: 0.0 standard drinks   Drug use: No   Sexual activity: Not on file  Other Topics Concern   Not on file  Social History Narrative   Social History     Marital  status: Widowed           Spouse name:                        Years of education:  1 year college              Number of children:0           Widowed no children      Occupational History: Editor, commissioning, Adm. Therapist, music care, [employer].)     None on file      Social History Main Topics     Smoking status: Former Smoker                                             Packs/day: 1.00      Years: 0.00            Types: Cigarettes        Smokeless tobacco: Not on file                        Alcohol use: No               Drug use: No               Sexual activity: Not on file            Does not drink caffeine, but does eat chocolate.   Does live in an apartment ([address]) retirement community does exercise : walks daily   Right handed         Social Determinants of Health   Financial Resource Strain: Not on file  Food Insecurity: Not on file  Transportation Needs: Not on file  Physical Activity: Not on file  Stress: Not on file  Social Connections: Not on file  Intimate Partner Violence: Not on file    Functional Status Survey:    Family History  Problem Relation Age of Onset   Alcohol abuse Father    Diabetes Father    Diabetes Maternal Grandmother    Diabetes Paternal Grandmother    Colon cancer Maternal Grandfather    Esophageal cancer Neg Hx    Rectal cancer Neg Hx    Stomach cancer Neg Hx     Health Maintenance  Topic Date Due   MAMMOGRAM  08/13/2017   COVID-19 Vaccine (3 - Moderna risk series) 11/06/2020   URINE MICROALBUMIN  04/19/2021   INFLUENZA VACCINE  09/17/2021   HEMOGLOBIN A1C  10/26/2021   FOOT EXAM  04/11/2022   OPHTHALMOLOGY EXAM  05/07/2022   TETANUS/TDAP  08/21/2027   Pneumonia Vaccine 44+ Years old  Completed   DEXA SCAN  Completed   Zoster Vaccines- Shingrix  Completed   HPV VACCINES  Aged Out    Allergies  Allergen Reactions   Cat Hair Extract Other (See Comments)    Patient states that it causes her eyes  to swell shut   Cefaclor Other (See Comments)    Unknown reaction   Codeine Nausea Only   Fish Allergy Nausea Only   Iodinated Contrast Media Other (See Comments)    Unknown reaction     Other Other (See Comments)    Seeds -- Reflux   Penicillins Itching   Percocet [Oxycodone-Acetaminophen] Nausea And Vomiting   Red Dye Other (See Comments)    Reflux   Senna-Docusate Sodium [Sennosides-Docusate Sodium] Other (See Comments)    Severe stomach pains   Shrimp (Diagnostic) Nausea Only    Stomach pain    Tomato Other (See Comments)    Reflux    Allergies as of 07/02/2021       Reactions   Cat Hair Extract Other (See Comments)   Patient states that it causes her eyes to swell shut   Cefaclor Other (See Comments)   Unknown reaction   Codeine Nausea Only   Fish Allergy Nausea Only   Iodinated Contrast Media Other (See Comments)   Unknown reaction   Other Other (See Comments)   Seeds -- Reflux   Penicillins Itching   Percocet [oxycodone-acetaminophen] Nausea And Vomiting   Red Dye Other (See Comments)   Reflux   Senna-docusate Sodium [sennosides-docusate Sodium] Other (See Comments)   Severe stomach pains   Shrimp (diagnostic) Nausea Only   Stomach pain   Tomato Other (See Comments)   Reflux        Medication List        Accurate as of Jul 02, 2021  1:37 PM. If you have any questions, ask your nurse or doctor.          STOP taking these medications    lidocaine 5 % Commonly known as: LIDODERM Stopped by: Mahlon Gammon, MD       TAKE these medications    acetaminophen 500 MG tablet Commonly known as: TYLENOL Take 1,000 mg by mouth every 8 (eight) hours as needed for mild pain.   atorvastatin 10 MG tablet Commonly known as: LIPITOR Take by mouth daily. 1/2 tablet   BOOST GLUCOSE CONTROL PO Take 1 Package by mouth daily.   Caltrate 600+D Plus Minerals 600-800 MG-UNIT Chew Chew 2 each by mouth daily.   carbamazepine 200 MG tablet Commonly known  as: TEGRETOL Take 200 mg by mouth daily. 1 & 1/2 tablets   cetirizine 10 MG tablet Commonly known as: ZYRTEC Take 10 mg by mouth daily.   clonazePAM 1 MG tablet Commonly known as: KLONOPIN Take 1 tablet (1 mg total) by mouth at bedtime for 5 days.   HYDROcodone-acetaminophen 5-325 MG tablet Commonly known as: NORCO/VICODIN Take 1 tablet by mouth every 4 (four) hours as needed for up to 5 days for moderate pain or severe pain.   hydrocortisone 1 % lotion Apply topically 3 (three) times daily.   lactulose 10 GM/15ML solution Commonly known as: CHRONULAC Take 45 mLs (30 g total) by mouth daily as needed for moderate constipation (if not relieved by Miralax).   methocarbamol 500 MG tablet Commonly known as: ROBAXIN Take 1 tablet (500 mg total) by mouth every 8 (eight) hours as needed for up to  7 days for muscle spasms.   polyethylene glycol powder 17 GM/SCOOP powder Commonly known as: GLYCOLAX/MIRALAX Take 17 g by mouth daily.   PRESERVISION AREDS PO Take 1 tablet by mouth 2 (two) times daily.   senna-docusate 8.6-50 MG tablet Commonly known as: Senokot-S Take 2 tablets by mouth 2 (two) times daily.   Soothe XP Soln Place 1 drop into both eyes in the morning, at noon, in the evening, and at bedtime.   Vitamin D (Ergocalciferol) 1.25 MG (50000 UNIT) Caps capsule Commonly known as: DRISDOL Take 50,000 Units by mouth every 7 (seven) days.        Review of Systems  Constitutional:  Negative for activity change and appetite change.  HENT: Negative.    Respiratory:  Negative for cough and shortness of breath.   Cardiovascular:  Negative for leg swelling.  Gastrointestinal:  Negative for constipation.  Genitourinary: Negative.   Musculoskeletal:  Positive for back pain and gait problem. Negative for arthralgias and myalgias.  Skin: Negative.   Neurological:  Positive for dizziness and weakness.  Psychiatric/Behavioral:  Positive for sleep disturbance. Negative for  confusion and dysphoric mood.    Vitals:   07/02/21 1327  BP: 112/68  Pulse: 82  Resp: 18  Temp: 97.7 F (36.5 C)  SpO2: 97%  Weight: 101 lb 14.4 oz (46.2 kg)  Height:  (1.651 m)   Body mass index is 16.96 kg/m. Physical Exam Vitals reviewed.  Constitutional:      Appearance: Normal appearance.  HENT:     Head: Normocephalic.     Nose: Nose normal.     Mouth/Throat:     Mouth: Mucous membranes are moist.     Pharynx: Oropharynx is clear.  Eyes:     Pupils: Pupils are equal, round, and reactive to light.  Cardiovascular:     Rate and Rhythm: Normal rate and regular rhythm.     Pulses: Normal pulses.     Heart sounds: Normal heart sounds. No murmur heard. Pulmonary:     Effort: Pulmonary effort is normal.     Breath sounds: Normal breath sounds.  Abdominal:     General: Abdomen is flat. Bowel sounds are normal.     Palpations: Abdomen is soft.  Musculoskeletal:        General: No swelling.     Cervical back: Neck supple.  Skin:    General: Skin is warm.  Neurological:     General: No focal deficit present.     Mental Status: She is alert and oriented to person, place, and time.     Comments: Mild Nystagmus in Right side Did not c/o Vertigo on changing the position  Psychiatric:        Mood and Affect: Mood normal.        Thought Content: Thought content normal.    Labs reviewed: Basic Metabolic Panel: Recent Labs    06/21/21 1145 06/22/21 0312 06/26/21 0056  NA 130* 133* 130*  K 4.2 3.7 4.0  CL 94* 100 97*  CO2 GLUCOSE 170* 155* 186*  BUN CREATININE 0.60 0.52 0.54  CALCIUM 9.4 8.7* 9.2   Liver Function Tests: Recent Labs    08/15/20 0813 12/21/20 0000 05/16/21 0000 06/21/21 1145  AST ALT 32  ALKPHOS  --  38 47 99  BILITOT 0.5  --   --  0.5  PROT 6.2  --   --  6.6  ALBUMIN  --  3.8 3.3* 4.3   No results for input(s): LIPASE, AMYLASE in the last 8760 hours. No results for input(s): AMMONIA in  the last 8760 hours. CBC: Recent Labs    05/12/21 0506 05/16/21 0000 06/21/21 1145 06/26/21 0056  WBC 7.6 7.1 10.0 6.1  NEUTROABS  --  4,963.00 8.3* 3.9  HGB 9.4* 10.2* 13.0 13.1  HCT 29.2* 31* 39.7 38.2  MCV 99.7  --  97.3 96.5  PLT 152 328 215 194   Cardiac Enzymes: No results for input(s): CKTOTAL, CKMB, CKMBINDEX, TROPONINI in the last 8760 hours. BNP: Invalid input(s): POCBNP Lab Results  Component Value Date   HGBA1C 5.7 (H) 04/25/2021   Lab Results  Component Value Date   TSH 3.20 12/21/2020   No results found for: VITAMINB12 No results found for: FOLATE No results found for: IRON, TIBC, FERRITIN  Imaging and Procedures obtained prior to SNF admission: DG Chest 2 View  Result Date: 06/21/2021 CLINICAL DATA:  Lower back pain. EXAM: CHEST - 2 VIEW COMPARISON:  May 09, 2021. FINDINGS: The heart size and mediastinal contours are within normal limits. Both lungs are clear. Mild compression deformity is seen involving the T8 and L1 vertebral bodies concerning for fractures of indeterminate age. IMPRESSION: No acute cardiopulmonary abnormality seen. Mild compression deformity is seen involving the T8 and L1 vertebral bodies concerning for fractures of indeterminate age. Electronically Signed   By: Lupita RaiderJames  Green Jr M.D.   On: 06/21/2021 12:21   DG Thoracic Spine 2 View  Result Date: 06/21/2021 CLINICAL DATA:  Upper back pain after fall. EXAM: THORACIC SPINE 2 VIEWS COMPARISON:  None Available. FINDINGS: Mild compression deformity of T8 vertebral body is noted concerning for fracture of indeterminate age. No spondylolisthesis is noted. Disc spaces are well-maintained. IMPRESSION: Mild compression deformity of T8 vertebral body is noted concerning for fracture of indeterminate age. MRI may be performed for further evaluation. Electronically Signed   By: Lupita RaiderJames  Green Jr M.D.   On: 06/21/2021 12:23   DG Lumbar Spine Complete  Result Date: 06/21/2021 CLINICAL DATA:  Lower back pain  after fall. EXAM: LUMBAR SPINE - COMPLETE 4+ VIEW COMPARISON:  CT of April 29, 2018. FINDINGS: There is now noted mild to moderate compression deformity of L1 vertebral body concerning for fracture of indeterminate age. No spondylolisthesis is noted. Mild degenerative disc disease is noted at L2-3. Osteopenia is noted. IMPRESSION: Mild to moderate compression deformity of L1 vertebral body concerning for fracture of indeterminate age. MRI may be performed for further evaluation. Electronically Signed   By: Lupita RaiderJames  Green Jr M.D.   On: 06/21/2021 12:22   CT Head Wo Contrast  Result Date: 06/21/2021 CLINICAL DATA:  Dizziness EXAM: CT HEAD WITHOUT CONTRAST TECHNIQUE: Contiguous axial images were obtained from the base of the skull through the vertex without intravenous contrast. RADIATION DOSE REDUCTION: This exam was performed according to the departmental dose-optimization program which includes automated exposure control, adjustment of the mA and/or kV according to patient size and/or use of iterative reconstruction technique. COMPARISON:  None Available. FINDINGS: Brain: Chronic white matter ischemic change. Age related global atrophy. No evidence of acute infarction, hemorrhage, hydrocephalus, extra-axial collection or mass lesion/mass effect. Vascular: No hyperdense vessel or unexpected calcification. Skull: Normal. Negative for fracture or focal lesion. Sinuses/Orbits: No acute finding. Other: None. IMPRESSION: No acute intracranial abnormality. Electronically Signed   By: Allegra LaiLeah  Strickland M.D.   On: 06/21/2021 13:58    Assessment/Plan 1. S/P kyphoplasty Pain  seems better On Norco Prn and Robaxin Working with therapy Had Allergic reaction to Lidocaine patches  2. Vertigo Possible MRI negative Start her Trial on Meclizine 12.5 mg BID Eval by therapy  3. Rash of back due to Lidocaine patch On Hydrocortisone ointment  4. Chronic idiopathic constipation Having Diarrhea now Amedeo Gory to  PRN  5. S/P ORIF (open reduction internal fixation) fracture Doing well Walking with her walker Pain controlled  6. Nonintractable epileptic spasms without status epilepticus ([name]) On Tegretol  7. Insomnia, unspecified type Klonopin   8. Blood loss anemia HGb normal now  9. Hyperlipidemia, unspecified hyperlipidemia type On statin    Family/ staff Communication:   Labs/tests ordered:

## 2021-07-05 DIAGNOSIS — Z23 Encounter for immunization: Secondary | ICD-10-CM | POA: Diagnosis not present

## 2021-07-05 LAB — CBC AND DIFFERENTIAL
HCT: 39 (ref 36–46)
Hemoglobin: 12.9 (ref 12.0–16.0)
Neutrophils Absolute: 2685
Platelets: 300 10*3/uL (ref 150–400)
WBC: 4.9

## 2021-07-05 LAB — BASIC METABOLIC PANEL
BUN: 13 (ref 4–21)
CO2: 27 — AB (ref 13–22)
Chloride: 97 — AB (ref 99–108)
Creatinine: 0.5 (ref 0.5–1.1)
Glucose: 89
Potassium: 4.3 mEq/L (ref 3.5–5.1)
Sodium: 136 — AB (ref 137–147)

## 2021-07-05 LAB — COMPREHENSIVE METABOLIC PANEL
Calcium: 8.9 (ref 8.7–10.7)
eGFR: 93

## 2021-07-05 LAB — CBC: RBC: 4.04 (ref 3.87–5.11)

## 2021-07-05 MED FILL — Fentanyl Citrate Preservative Free (PF) Inj 100 MCG/2ML: INTRAMUSCULAR | Qty: 0.5 | Status: AC

## 2021-07-11 ENCOUNTER — Non-Acute Institutional Stay (SKILLED_NURSING_FACILITY): Payer: Medicare Other | Admitting: Orthopedic Surgery

## 2021-07-11 ENCOUNTER — Encounter: Payer: Medicare Other | Admitting: Nurse Practitioner

## 2021-07-11 ENCOUNTER — Encounter: Payer: Self-pay | Admitting: Orthopedic Surgery

## 2021-07-11 DIAGNOSIS — Z9889 Other specified postprocedural states: Secondary | ICD-10-CM

## 2021-07-11 DIAGNOSIS — G40822 Epileptic spasms, not intractable, without status epilepticus: Secondary | ICD-10-CM

## 2021-07-11 DIAGNOSIS — R42 Dizziness and giddiness: Secondary | ICD-10-CM | POA: Diagnosis not present

## 2021-07-12 ENCOUNTER — Other Ambulatory Visit: Payer: Self-pay | Admitting: Orthopedic Surgery

## 2021-07-12 DIAGNOSIS — G4700 Insomnia, unspecified: Secondary | ICD-10-CM

## 2021-07-12 MED ORDER — CLONAZEPAM 1 MG PO TABS: 1.0000 mg | ORAL_TABLET | Freq: Every day | ORAL | 0 refills | Status: DC

## 2021-07-12 NOTE — Progress Notes (Signed)
Location:  [facility] Room Number: NO31/A Place of Service:  SNF (31) Provider:  Octavia Heir, NP   Mast, Man X, NP  Patient Care Team: Mast, Man X, NP as PCP - General (Internal Medicine) Mast, Man X, NP as Nurse Practitioner (Internal Medicine)  Extended Emergency Contact Information Primary Emergency Contact: Charlies [place] Mobile Phone: [phone] Relation: Friend  Code Status:  Full code Goals of care: Advanced Directive information    07/11/2021    4:27 PM  Advanced Directives  Does Patient Have a Medical Advance Directive? Yes  Type of Advance Directive Healthcare Power of Attorney  Does patient want to make changes to medical advance directive? No - Patient declined  Copy of Healthcare Power of Attorney in Chart? Yes - validated most recent copy scanned in chart (See row information)     Chief Complaint  Patient presents with   Acute Visit    Patient is being seen for dizziness    HPI:  Pt is a 81 y.o. female seen today for acute visit due to dizziness.   She currently resides on the skilled nursing unit at [facility].   Hospitalized 05/05- 05/12 due to lumbar compression fracture related to mechanical fall. 05/07 MRI spine revealed acute T8 and L1 compression fractures, left subarticular disc protrusion at L5-S1 also noted. Due to significant pain and inability to walk she underwent balloon kyphoplasty 05/10, tolerated well. She reported new onset of dizziness for a few days prior to fall. MRI brain did not show any acute findings. Carotid duplex unremarkable. She was found to have mild positive changes on orthostatic blood pressures. She is followed by neurology for h/o seizures, remains on tegretol. She was discharged with meclizine 12.5 mg po bid x 7 days. Today, reports increased dizziness after completing medication 1 day ago. PT has tried Epley maneuver without success. Continues to ambulate with  rolator. No recent falls. She is taking Norco prn for back pain. Hgb 12.9 07/05/2021, other labs unremarkable.   Recent orthostatics 07/10/2021:  Lying- 132/70  Sitting- 128/69  Standing- 122/68   Past Medical History:  Diagnosis Date   Anxiety    Cataract    Chronic constipation    Depression    Osteoporosis    Seizures ([name])    epilepsy  last seizure 1977   Past Surgical History:  Procedure Laterality Date   ABDOMINAL HYSTERECTOMY  1995   Dr. Carlis Abbott   CATARACT EXTRACTION Bilateral 12/2016   COLONOSCOPY  2012   patchy increased intraepithelial lymphocytes - draelos   ESOPHAGOGASTRODUODENOSCOPY     multiple   FISSURECTOMY     INTRAMEDULLARY (IM) NAIL INTERTROCHANTERIC Left 05/09/2021   Procedure: INTRAMEDULLARY (IM) NAIL INTERTROCHANTRIC;  Surgeon: Cammy Copa, MD;  Location: [facility] OR;  Service: Orthopedics;  Laterality: Left;   IR KYPHO EA ADDL LEVEL THORACIC OR LUMBAR  06/26/2021   IR KYPHO THORACIC WITH BONE BIOPSY  06/26/2021   WRIST SURGERY Left    due to fracture    Allergies  Allergen Reactions   Cat Hair Extract Other (See Comments)    Patient states that it causes her eyes to swell shut   Cefaclor Other (See Comments)    Unknown reaction   Codeine Nausea Only   Fish Allergy Nausea Only   Iodinated Contrast Media Other (See Comments)    Unknown reaction     Other Other (See Comments)    Seeds -- Reflux  Penicillins Itching   Percocet [Oxycodone-Acetaminophen] Nausea And Vomiting   Red Dye Other (See Comments)    Reflux   Senna-Docusate Sodium [Sennosides-Docusate Sodium] Other (See Comments)    Severe stomach pains   Shrimp (Diagnostic) Nausea Only    Stomach pain    Tomato Other (See Comments)    Reflux   Hm Lidocaine Patch [Lidocaine] Rash    Outpatient Encounter Medications as of 07/11/2021  Medication Sig   acetaminophen (TYLENOL) 500 MG tablet Take 1,000 mg by mouth every 8 (eight) hours as needed for mild pain.   Artificial Tear  Solution (SOOTHE XP) SOLN Place 1 drop into both eyes in the morning, at noon, in the evening, and at bedtime.   atorvastatin (LIPITOR) 10 MG tablet Take by mouth daily. 1/2 tablet   Calcium Carbonate-Vit D-Min (CALTRATE 600+D PLUS MINERALS) 600-800 MG-UNIT CHEW Chew 2 each by mouth daily.   carbamazepine (TEGRETOL) 200 MG tablet Take 200 mg by mouth daily. 1 & 1/2 tablets   hydrocortisone 1 % lotion Apply topically 3 (three) times daily.   Multiple Vitamins-Minerals (PRESERVISION AREDS PO) Take 1 tablet by mouth 2 (two) times daily.   Nutritional Supplements (BOOST GLUCOSE CONTROL PO) Take 1 Package by mouth daily.   polyethylene glycol powder (GLYCOLAX/MIRALAX) 17 GM/SCOOP powder Take 17 g by mouth daily.   senna-docusate (SENOKOT-S) 8.6-50 MG tablet Take 2 tablets by mouth 2 (two) times daily.   Vitamin D, Ergocalciferol, (DRISDOL) 1.25 MG (50000 UNIT) CAPS capsule Take 50,000 Units by mouth every 7 (seven) days.   cetirizine (ZYRTEC) 10 MG tablet Take 10 mg by mouth daily.   clonazePAM (KLONOPIN) 1 MG tablet Take 1 tablet (1 mg total) by mouth at bedtime for 5 days.   No facility-administered encounter medications on file as of 07/11/2021.    Review of Systems  Constitutional:  Negative for activity change, appetite change, chills, fatigue and fever.  HENT:  Negative for congestion and trouble swallowing.   Eyes:  Negative for visual disturbance.  Respiratory:  Negative for cough, shortness of breath and wheezing.   Cardiovascular:  Positive for leg swelling. Negative for chest pain.  Gastrointestinal:  Positive for constipation. Negative for abdominal distention, abdominal pain, diarrhea, nausea and vomiting.  Genitourinary:  Negative for dysuria, frequency and hematuria.  Musculoskeletal:  Positive for arthralgias, back pain and gait problem.  Skin:  Negative for rash and wound.  Neurological:  Positive for dizziness and weakness. Negative for light-headedness and headaches.   Psychiatric/Behavioral:  Negative for confusion and dysphoric mood. The patient is not nervous/anxious.    Immunization History  Administered Date(s) Administered   Fluad Quad(high Dose 65+) 12/03/2020   Influenza Whole 11/19/2017   Influenza, High Dose Seasonal PF 11/26/2016, 12/01/2018, 11/30/2019   Influenza-Unspecified 11/18/2014, 11/29/2015   Moderna Covid-19 Vaccine Bivalent Booster 78yrs & up 11/06/2020   Moderna SARS-COV2 Booster Vaccination 12/27/2019, 06/26/2020   Moderna Sars-Covid-2 Vaccination 02/21/2019, 03/21/2019   Pneumococcal Conjugate-13 09/24/2017   Pneumococcal Polysaccharide-23 12/30/2012, 09/18/2018   Tdap 08/20/2017   Zoster Recombinat (Shingrix) 07/21/2017, 12/08/2017   Zoster, Live 05/28/2007   Pertinent  Health Maintenance Due  Topic Date Due   MAMMOGRAM  08/13/2017   URINE MICROALBUMIN  04/19/2021   INFLUENZA VACCINE  09/17/2021   HEMOGLOBIN A1C  10/26/2021   FOOT EXAM  04/11/2022   OPHTHALMOLOGY EXAM  05/07/2022   DEXA SCAN  Completed      06/25/2021    7:30 AM 06/25/2021    8:54 PM 06/26/2021  8:37 PM 06/27/2021    8:00 AM 06/27/2021    8:20 PM  Fall Risk  Patient Fall Risk Level High fall risk High fall risk High fall risk High fall risk High fall risk   Functional Status Survey:    Vitals:   07/11/21 1628  BP: 103/63  Pulse: 94  Resp: 18  Temp: 97.7 F (36.5 C)  SpO2: 97%  Weight: 100 lb 6.4 oz (45.5 kg)  Height:  (1.651 m)   Body mass index is 16.71 kg/m. Physical Exam Vitals reviewed.  Constitutional:      General: She is not in acute distress. HENT:     Head: Normocephalic.     Right Ear: There is no impacted cerumen.     Left Ear: There is no impacted cerumen.     Nose: Nose normal.  Eyes:     General:        Right eye: No discharge.        Left eye: No discharge.     Extraocular Movements:     Right eye: Normal extraocular motion and no nystagmus.     Left eye: Normal extraocular motion and no nystagmus.   Cardiovascular:     Rate and Rhythm: Normal rate and regular rhythm.     Pulses: Normal pulses.     Heart sounds: Normal heart sounds.  Pulmonary:     Effort: Pulmonary effort is normal. No respiratory distress.     Breath sounds: Normal breath sounds. No wheezing.  Abdominal:     General: Bowel sounds are normal. There is no distension.     Palpations: Abdomen is soft.     Tenderness: There is no abdominal tenderness.  Musculoskeletal:     Cervical back: Neck supple.     Right lower leg: No edema.     Left lower leg: No edema.  Skin:    General: Skin is warm and dry.     Capillary Refill: Capillary refill takes less than 2 seconds.  Neurological:     General: No focal deficit present.     Mental Status: She is alert and oriented to person, place, and time.     Motor: Weakness present.     Gait: Gait abnormal.  Psychiatric:        Mood and Affect: Mood normal.        Behavior: Behavior normal.    Labs reviewed: Recent Labs    06/21/21 1145 06/22/21 0312 06/26/21 0056  NA 130* 133* 130*  K 4.2 3.7 4.0  CL 94* 100 97*  CO2 GLUCOSE 170* 155* 186*  BUN CREATININE 0.60 0.52 0.54  CALCIUM 9.4 8.7* 9.2   Recent Labs    08/15/20 0813 12/21/20 0000 05/16/21 0000 06/21/21 1145  AST ALT 32  ALKPHOS  --  38 47 99  BILITOT 0.5  --   --  0.5  PROT 6.2  --   --  6.6  ALBUMIN  --  3.8 3.3* 4.3   Recent Labs    05/12/21 0506 05/16/21 0000 06/21/21 1145 06/26/21 0056  WBC 7.6 7.1 10.0 6.1  NEUTROABS  --  4,963.00 8.3* 3.9  HGB 9.4* 10.2* 13.0 13.1  HCT 29.2* 31* 39.7 38.2  MCV 99.7  --  97.3 96.5  PLT 152 328 215 194   Lab Results  Component Value Date   TSH 3.20 12/21/2020   Lab Results  Component Value Date   HGBA1C 5.7 (H) 04/25/2021   Lab Results  Component Value Date   CHOL 153 08/15/2020   HDL 48 (L) 08/15/2020   LDLCALC 79 08/15/2020   TRIG 159 (H) 08/15/2020   CHOLHDL 3.2 08/15/2020    Significant  Diagnostic Results in last 30 days:  DG Chest 2 View  Result Date: 06/21/2021 CLINICAL DATA:  Lower back pain. EXAM: CHEST - 2 VIEW COMPARISON:  May 09, 2021. FINDINGS: The heart size and mediastinal contours are within normal limits. Both lungs are clear. Mild compression deformity is seen involving the T8 and L1 vertebral bodies concerning for fractures of indeterminate age. IMPRESSION: No acute cardiopulmonary abnormality seen. Mild compression deformity is seen involving the T8 and L1 vertebral bodies concerning for fractures of indeterminate age. Electronically Signed   By: Lupita Raider M.D.   On: 06/21/2021 12:21   DG Thoracic Spine 2 View  Result Date: 06/21/2021 CLINICAL DATA:  Upper back pain after fall. EXAM: THORACIC SPINE 2 VIEWS COMPARISON:  None Available. FINDINGS: Mild compression deformity of T8 vertebral body is noted concerning for fracture of indeterminate age. No spondylolisthesis is noted. Disc spaces are well-maintained. IMPRESSION: Mild compression deformity of T8 vertebral body is noted concerning for fracture of indeterminate age. MRI may be performed for further evaluation. Electronically Signed   By: Lupita Raider M.D.   On: 06/21/2021 12:23   DG Lumbar Spine Complete  Result Date: 06/21/2021 CLINICAL DATA:  Lower back pain after fall. EXAM: LUMBAR SPINE - COMPLETE 4+ VIEW COMPARISON:  CT of April 29, 2018. FINDINGS: There is now noted mild to moderate compression deformity of L1 vertebral body concerning for fracture of indeterminate age. No spondylolisthesis is noted. Mild degenerative disc disease is noted at L2-3. Osteopenia is noted. IMPRESSION: Mild to moderate compression deformity of L1 vertebral body concerning for fracture of indeterminate age. MRI may be performed for further evaluation. Electronically Signed   By: Lupita Raider M.D.   On: 06/21/2021 12:22   CT Head Wo Contrast  Result Date: 06/21/2021 CLINICAL DATA:  Dizziness EXAM: CT HEAD WITHOUT CONTRAST  TECHNIQUE: Contiguous axial images were obtained from the base of the skull through the vertex without intravenous contrast. RADIATION DOSE REDUCTION: This exam was performed according to the departmental dose-optimization program which includes automated exposure control, adjustment of the mA and/or kV according to patient size and/or use of iterative reconstruction technique. COMPARISON:  None Available. FINDINGS: Brain: Chronic white matter ischemic change. Age related global atrophy. No evidence of acute infarction, hemorrhage, hydrocephalus, extra-axial collection or mass lesion/mass effect. Vascular: No hyperdense vessel or unexpected calcification. Skull: Normal. Negative for fracture or focal lesion. Sinuses/Orbits: No acute finding. Other: None. IMPRESSION: No acute intracranial abnormality. Electronically Signed   By: Allegra Lai M.D.   On: 06/21/2021 13:58   MR BRAIN WO CONTRAST  Result Date: 06/25/2021 CLINICAL DATA:  Dizziness, persistent or recurrent with cardiac or vascular cause suspected. EXAM: MRI HEAD WITHOUT CONTRAST TECHNIQUE: Multiplanar, multiecho pulse sequences of the brain and surrounding structures were obtained without intravenous contrast. COMPARISON:  Head CT from 4 days ago FINDINGS: Brain: No acute infarction, hemorrhage, hydrocephalus, or mass lesion. Generalized brain atrophy with ventriculomegaly and diffuse sulcal widening. Less than typical for age periventricular chronic small vessel ischemia. Trace subdural space FLAIR hyperintensity on 11:21, low-density and chronic by CT. Vascular: Major flow voids are preserved, including the dominant left vertebral artery and basilar. Skull and upper cervical spine: No  focal marrow lesion. Sinuses/Orbits: Negative for pathologic finding. Bilateral cataract resection. IMPRESSION: 1. No acute finding, including infarct. 2. Generalized brain atrophy. Electronically Signed   By: Tiburcio Pea M.D.   On: 06/25/2021 11:34   MR  THORACIC SPINE WO CONTRAST  Result Date: 06/23/2021 CLINICAL DATA:  Fall EXAM: MRI THORACIC AND LUMBAR SPINE WITHOUT CONTRAST TECHNIQUE: Multiplanar and multiecho pulse sequences of the thoracic and lumbar spine were obtained without intravenous contrast. COMPARISON:  None Available. FINDINGS: MRI THORACIC SPINE FINDINGS Alignment:  Physiologic. Vertebrae: There is a T8 compression fracture with mild height loss and edema extending into both pedicles. No retropulsion. Cord:  Normal Paraspinal and other soft tissues: Small pleural effusions Disc levels: No spinal canal stenosis.  Small disc protrusion at T9-10. MRI LUMBAR SPINE FINDINGS Segmentation:  Standard. Alignment:  Physiologic. Vertebrae: L1 compression fracture with mild height loss and edema within the top half of the vertebral body. No retropulsion. Conus medullaris and cauda equina: Conus extends to the L1 level. Conus and cauda equina appear normal. Paraspinal and other soft tissues: 4.2 cm right renal cyst for which no follow-up is needed. Disc levels: L1-L2: Small central disc protrusion. No spinal canal stenosis. No neural foraminal stenosis. L2-L3: Normal disc space and facet joints. No spinal canal stenosis. No neural foraminal stenosis. L3-L4: Normal disc space and facet joints. No spinal canal stenosis. No neural foraminal stenosis. L4-L5: Minimal disc bulge. No spinal canal stenosis. No neural foraminal stenosis. L5-S1: Small left subarticular disc protrusion narrowing the left lateral recess and mildly displacing the left S1 nerve root. No central spinal canal stenosis. No neural foraminal stenosis. Visualized sacrum: Normal. IMPRESSION: 1. Acute T8 and L1 compression fractures with mild height loss. Edema at T8 extends into the pedicles. CT would be helpful to better assess the fracture morphology. 2. Small left subarticular disc protrusion at L5-S1 narrowing the left lateral recess and mildly displacing the left S1 nerve root. This may be a  source of left S1 radiculopathy. Electronically Signed   By: Deatra Robinson M.D.   On: 06/23/2021 19:38   MR LUMBAR SPINE WO CONTRAST  Result Date: 06/23/2021 CLINICAL DATA:  Fall EXAM: MRI THORACIC AND LUMBAR SPINE WITHOUT CONTRAST TECHNIQUE: Multiplanar and multiecho pulse sequences of the thoracic and lumbar spine were obtained without intravenous contrast. COMPARISON:  None Available. FINDINGS: MRI THORACIC SPINE FINDINGS Alignment:  Physiologic. Vertebrae: There is a T8 compression fracture with mild height loss and edema extending into both pedicles. No retropulsion. Cord:  Normal Paraspinal and other soft tissues: Small pleural effusions Disc levels: No spinal canal stenosis.  Small disc protrusion at T9-10. MRI LUMBAR SPINE FINDINGS Segmentation:  Standard. Alignment:  Physiologic. Vertebrae: L1 compression fracture with mild height loss and edema within the top half of the vertebral body. No retropulsion. Conus medullaris and cauda equina: Conus extends to the L1 level. Conus and cauda equina appear normal. Paraspinal and other soft tissues: 4.2 cm right renal cyst for which no follow-up is needed. Disc levels: L1-L2: Small central disc protrusion. No spinal canal stenosis. No neural foraminal stenosis. L2-L3: Normal disc space and facet joints. No spinal canal stenosis. No neural foraminal stenosis. L3-L4: Normal disc space and facet joints. No spinal canal stenosis. No neural foraminal stenosis. L4-L5: Minimal disc bulge. No spinal canal stenosis. No neural foraminal stenosis. L5-S1: Small left subarticular disc protrusion narrowing the left lateral recess and mildly displacing the left S1 nerve root. No central spinal canal stenosis. No neural foraminal stenosis. Visualized sacrum:  Normal. IMPRESSION: 1. Acute T8 and L1 compression fractures with mild height loss. Edema at T8 extends into the pedicles. CT would be helpful to better assess the fracture morphology. 2. Small left subarticular disc  protrusion at L5-S1 narrowing the left lateral recess and mildly displacing the left S1 nerve root. This may be a source of left S1 radiculopathy. Electronically Signed   By: Deatra Robinson M.D.   On: 06/23/2021 19:38   IR KYPHO THORACIC WITH BONE BIOPSY  Result Date: 07/01/2021 INDICATION: Severe low back pain secondary to compression fracture at T8 and L1. EXAM: BALLOON KYPHOPLASTY COMPARISON:  None Available. MEDICATIONS: As antibiotic prophylaxis, vancomycin 1 g IV was ordered pre-procedure and administered intravenously within 1 hour of incision. All current medications are in the EMR and have been reviewed as part of this encounter. ANESTHESIA/SEDATION: Moderate (conscious) sedation was employed during this procedure. A total of Versed 2 mg and Fentanyl 75 mcg was administered intravenously by the radiology nurse. Total intra-service moderate Sedation Time: 42 minutes. The patient's level of consciousness and vital signs were monitored continuously by radiology nursing throughout the procedure under my direct supervision. FLUOROSCOPY: Radiation Exposure Index (as provided by the fluoroscopic device): 485 mGy Kerma COMPLICATIONS: None immediate. PROCEDURE: Following a full explanation of the procedure along with the potential associated complications, an informed witnessed consent was obtained. The patient was placed prone on the fluoroscopic table. The skin overlying the thoracolumbar region was then prepped and draped in the usual sterile fashion. The right pedicle at T8 and L1 were then infiltrated with 0.25% bupivacaine followed by the advancement of an 11-gauge Jamshidi needle through the right pedicle at T8 and L1 into the posterior one-third at both levels. These were then exchanged for a Kyphon advanced osteo introducer system comprised of a working cannula and a Kyphon osteo drill. This combination at each level was then advanced over a Kyphon osteo bone pin until the tip of the Kyphon osteo drill  was in the posterior third at each level. At this time, the bone pin was removed. In a medial trajectory, the combination was advanced at both levels, until the tip of the working cannula was inside the posterior one-third at T8 and L1. The osteo drills were removed and core sample from each vertebral body was sent for pathologic analysis. Through the working cannula, a Kyphon bone biopsy device was advanced to within 5 mm of the anterior aspect of T8 and L1. A core sample from each level was also sent for pathologic analysis. Through the working cannulae, a Kyphon inflatable bone tamp 20 x 3 was advanced and positioned with the distal marker 5 mm from the anterior aspect of T8 and L1. Crossing of the midline was seen on the AP projection. At this time, the balloon was expanded using contrast via a Kyphon inflation syringe device via microtubing at both levels. Inflations were continued until there was apposition with the superior endplates. At this time, methylmethacrylate mixture was reconstituted with Tobramycin in the Kyphon bone mixing device system. This was then loaded onto the Kyphon bone fillers. The balloons were deflated and removed followed by the instillation of 4 bone filler equivalents of methylmethacrylate mixture at T8 ,and 5 bone filler equivalents of methylmethacrylate mixture at L1 with excellent filling in the AP and lateral projections. No extravasation was noted in the disk spaces or posteriorly into the spinal canal. No epidural venous contamination was seen. The working cannulae and the bone fillers were then retrieved and removed.  Hemostasis was achieved at the skin entry sites. Patient was then transferred back to her room in stable condition. IMPRESSION: 1. Status post fluoroscopic-guided needle placement for deep core bone biopsy at T8 and L1. 2. Status post vertebral body augmentation using balloon kyphoplasty at T8 and L1 as described without event. 3. Patient instructed to contact her  referring MD for biopsy results. Patient expressed understanding and agreement with the above management plan. If the patient has known osteoporosis, recommend treatment as clinically indicated. If the patient's bone density status is unknown, DEXA scan is recommended. Electronically Signed   By: Julieanne Cotton M.D.   On: 07/01/2021 08:30   IR KYPHO EA ADDL LEVEL THORACIC OR LUMBAR  Result Date: 07/01/2021 INDICATION: Severe low back pain secondary to compression fracture at T8 and L1. EXAM: BALLOON KYPHOPLASTY COMPARISON:  None Available. MEDICATIONS: As antibiotic prophylaxis, vancomycin 1 g IV was ordered pre-procedure and administered intravenously within 1 hour of incision. All current medications are in the EMR and have been reviewed as part of this encounter. ANESTHESIA/SEDATION: Moderate (conscious) sedation was employed during this procedure. A total of Versed 2 mg and Fentanyl 75 mcg was administered intravenously by the radiology nurse. Total intra-service moderate Sedation Time: 42 minutes. The patient's level of consciousness and vital signs were monitored continuously by radiology nursing throughout the procedure under my direct supervision. FLUOROSCOPY: Radiation Exposure Index (as provided by the fluoroscopic device): 485 mGy Kerma COMPLICATIONS: None immediate. PROCEDURE: Following a full explanation of the procedure along with the potential associated complications, an informed witnessed consent was obtained. The patient was placed prone on the fluoroscopic table. The skin overlying the thoracolumbar region was then prepped and draped in the usual sterile fashion. The right pedicle at T8 and L1 were then infiltrated with 0.25% bupivacaine followed by the advancement of an 11-gauge Jamshidi needle through the right pedicle at T8 and L1 into the posterior one-third at both levels. These were then exchanged for a Kyphon advanced osteo introducer system comprised of a working cannula and a  Kyphon osteo drill. This combination at each level was then advanced over a Kyphon osteo bone pin until the tip of the Kyphon osteo drill was in the posterior third at each level. At this time, the bone pin was removed. In a medial trajectory, the combination was advanced at both levels, until the tip of the working cannula was inside the posterior one-third at T8 and L1. The osteo drills were removed and core sample from each vertebral body was sent for pathologic analysis. Through the working cannula, a Kyphon bone biopsy device was advanced to within 5 mm of the anterior aspect of T8 and L1. A core sample from each level was also sent for pathologic analysis. Through the working cannulae, a Kyphon inflatable bone tamp 20 x 3 was advanced and positioned with the distal marker 5 mm from the anterior aspect of T8 and L1. Crossing of the midline was seen on the AP projection. At this time, the balloon was expanded using contrast via a Kyphon inflation syringe device via microtubing at both levels. Inflations were continued until there was apposition with the superior endplates. At this time, methylmethacrylate mixture was reconstituted with Tobramycin in the Kyphon bone mixing device system. This was then loaded onto the Kyphon bone fillers. The balloons were deflated and removed followed by the instillation of 4 bone filler equivalents of methylmethacrylate mixture at T8 ,and 5 bone filler equivalents of methylmethacrylate mixture at L1 with excellent filling  in the AP and lateral projections. No extravasation was noted in the disk spaces or posteriorly into the spinal canal. No epidural venous contamination was seen. The working cannulae and the bone fillers were then retrieved and removed. Hemostasis was achieved at the skin entry sites. Patient was then transferred back to her room in stable condition. IMPRESSION: 1. Status post fluoroscopic-guided needle placement for deep core bone biopsy at T8 and L1. 2.  Status post vertebral body augmentation using balloon kyphoplasty at T8 and L1 as described without event. 3. Patient instructed to contact her referring MD for biopsy results. Patient expressed understanding and agreement with the above management plan. If the patient has known osteoporosis, recommend treatment as clinically indicated. If the patient's bone density status is unknown, DEXA scan is recommended. Electronically Signed   By: Julieanne Cotton M.D.   On: 07/01/2021 08:30   VAS US CAROTID  Result Date: 06/24/2021 Carotid Arterial Duplex Study Patient Name:  Erin Ochoa  Date of Exam:   06/24/2021 Medical Rec #: [id number]      Accession #:    [id number] Date of Birth: 12/26/1940      Patient Gender: F Patient Age:   81 years Exam Location:  [facility] Procedure:      VAS US CAROTID Referring Phys: Kathlen Mody --------------------------------------------------------------------------------  Indications:       Dizziness, fall resulting in thoracic compression fracture                    06/21/21. Risk Factors:      Hyperlipidemia. Other Factors:     Epilepsy. Comparison Study:  No prior study on file Performing Technologist: Sherren Kerns RVS  Examination Guidelines: A complete evaluation includes B-mode imaging, spectral Doppler, color Doppler, and power Doppler as needed of all accessible portions of each vessel. Bilateral testing is considered an integral part of a complete examination. Limited examinations for reoccurring indications may be performed as noted.  Right Carotid Findings: +----------+--------+--------+--------+------------------+------------------+           PSV cm/sEDV cm/sStenosisPlaque DescriptionComments           +----------+--------+--------+--------+------------------+------------------+ CCA Prox  81      11                                intimal thickening +----------+--------+--------+--------+------------------+------------------+ CCA Distal146     21                                 intimal thickening +----------+--------+--------+--------+------------------+------------------+ ICA Prox  101     10              heterogenous                         +----------+--------+--------+--------+------------------+------------------+ ICA Distal120     25                                                   +----------+--------+--------+--------+------------------+------------------+ ECA       117     2                                                    +----------+--------+--------+--------+------------------+------------------+ +----------+--------+-------+--------+-------------------+  PSV cm/sEDV cmsDescribeArm Pressure (mmHG) +----------+--------+-------+--------+-------------------+ Subclavian162                                        +----------+--------+-------+--------+-------------------+ +---------+--------+--+--------+-+ VertebralPSV cm/s52EDV cm/s5 +---------+--------+--+--------+-+  Left Carotid Findings: +----------+--------+--------+--------+------------------+------------------+           PSV cm/sEDV cm/sStenosisPlaque DescriptionComments           +----------+--------+--------+--------+------------------+------------------+ CCA Prox  141     22                                intimal thickening +----------+--------+--------+--------+------------------+------------------+ CCA Distal137     20                                intimal thickening +----------+--------+--------+--------+------------------+------------------+ ICA Prox  92      16              calcific                             +----------+--------+--------+--------+------------------+------------------+ ICA Distal99      21                                                   +----------+--------+--------+--------+------------------+------------------+ ECA       126     2                                                     +----------+--------+--------+--------+------------------+------------------+ +----------+--------+--------+--------+-------------------+           PSV cm/sEDV cm/sDescribeArm Pressure (mmHG) +----------+--------+--------+--------+-------------------+ [id number]                                         +----------+--------+--------+--------+-------------------+ +---------+--------+---+--------+--+ VertebralPSV cm/s110EDV cm/s16 +---------+--------+---+--------+--+   Summary: Right Carotid: Velocities in the right ICA are consistent with a 1-39% stenosis. Left Carotid: Velocities in the left ICA are consistent with a 1-39% stenosis. Vertebrals:  Bilateral vertebral arteries demonstrate antegrade flow. Subclavians: Normal flow hemodynamics were seen in bilateral subclavian              arteries. *See table(s) above for measurements and observations.  Electronically signed by Lemar LivingsBrandon Cain MD on 06/24/2021 at 7:49:58 PM.    Final     Assessment/Plan 1. Vertigo - ongoing - MRI Brain unremarkable, Hgb > 12 - exam normal today - orthostatics normal - will restart meclizine 12.5 mg po bid x 7 days, then BID prn - she does not drink water well- advised to increase fluid intake - schedule f/u with neurology  2. S/P kyphoplasty - Hospitalized 05/05- 05/12 due to mechanical fall - 05/10 kyphoplasty  - pain tolerated with tylenol and hydrocodone - cont PT/OT  3. 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

## 2021-07-16 ENCOUNTER — Other Ambulatory Visit: Payer: Self-pay | Admitting: Internal Medicine

## 2021-07-16 DIAGNOSIS — R42 Dizziness and giddiness: Secondary | ICD-10-CM

## 2021-07-19 ENCOUNTER — Other Ambulatory Visit: Payer: Self-pay | Admitting: Adult Health

## 2021-07-19 MED ORDER — HYDROCODONE-ACETAMINOPHEN 5-325 MG PO TABS: 1.0000 | ORAL_TABLET | ORAL | 0 refills | Status: DC | PRN

## 2021-07-30 ENCOUNTER — Non-Acute Institutional Stay (SKILLED_NURSING_FACILITY): Payer: Medicare Other | Admitting: Internal Medicine

## 2021-07-30 ENCOUNTER — Encounter: Payer: Self-pay | Admitting: Internal Medicine

## 2021-07-30 DIAGNOSIS — Z8781 Personal history of (healed) traumatic fracture: Secondary | ICD-10-CM | POA: Diagnosis not present

## 2021-07-30 DIAGNOSIS — R42 Dizziness and giddiness: Secondary | ICD-10-CM

## 2021-07-30 DIAGNOSIS — Z9889 Other specified postprocedural states: Secondary | ICD-10-CM | POA: Diagnosis not present

## 2021-07-30 DIAGNOSIS — E785 Hyperlipidemia, unspecified: Secondary | ICD-10-CM | POA: Diagnosis not present

## 2021-07-30 DIAGNOSIS — G4700 Insomnia, unspecified: Secondary | ICD-10-CM | POA: Diagnosis not present

## 2021-07-30 DIAGNOSIS — G40822 Epileptic spasms, not intractable, without status epilepticus: Secondary | ICD-10-CM

## 2021-07-30 NOTE — Progress Notes (Signed)
Location:   [facility] Room Number: 34 Place of Service:  SNF (31)  Provider: Veleta Miners MD   PCP: Mast, Man X, NP Patient Care Team: Mast, Man X, NP as PCP - General (Internal Medicine) Mast, Man X, NP as Nurse Practitioner (Internal Medicine)  Extended Emergency Contact Information Primary Emergency Contact: Marko Stai [place] Mobile Phone: [phone] Relation: Friend  Code Status: Full Code Goals of care:  Advanced Directive information    07/30/2021    2:10 PM  Advanced Directives  Does Patient Have a Medical Advance Directive? Yes  Type of Advance Directive [place]  Does patient want to make changes to medical advance directive? No - Patient declined  Copy of [place] in Chart? Yes - validated most recent copy scanned in chart (See row information)     Allergies  Allergen Reactions   Cat Hair Extract Other (See Comments)    Patient states that it causes her eyes to swell shut   Cefaclor Other (See Comments)    Unknown reaction   Codeine Nausea Only   Fish Allergy Nausea Only   Iodinated Contrast Media Other (See Comments)    Unknown reaction     Other Other (See Comments)    Seeds -- Reflux   Penicillins Itching   Percocet [Oxycodone-Acetaminophen] Nausea And Vomiting   Red Dye Other (See Comments)    Reflux   Senna-Docusate Sodium [Sennosides-Docusate Sodium] Other (See Comments)    Severe stomach pains   Shrimp (Diagnostic) Nausea Only    Stomach pain    Tomato Other (See Comments)    Reflux   Hm Lidocaine Patch [Lidocaine] Rash    Chief Complaint  Patient presents with   Discharge Note    Discharge from SNF    HPI:  81 y.o. female  Seen to be discharged from SNF to [place]   Patient has a history of seizures on Tegretol, constipation, HLD, osteoporosis, insomnia and erosive gastropathy. She was also Admitted in the hospital from 03/23 to 03/27 for left  hip fracture after a mechanical fall ORIF on 03/23 After ORIF patient was in SNF and underwent Therapy Was eventually discharged to her apartment She says next day she was fixing herself some food and felt dizzy and felt on the floor and then felt pain in her back She had MRI of head negative for any acute changes General Atropy Xray of back showed Acute Compression fracture of T 8 and L1 Underwent Kyphoplasty on 05/10   She did well with therapy Is walking with her Gilford Rile but continues to feel Dizzy It is hard for her to describe She says she feels like hear head is not right all the time and especially when she does not sleep On Meclizine which helps some . Not orthostatic   Past Medical History:  Diagnosis Date   Anxiety    Cataract    Chronic constipation    Depression    Osteoporosis    Seizures ([place])    epilepsy  last seizure 1977    Past Surgical History:  Procedure Laterality Date   ABDOMINAL HYSTERECTOMY  1995   Dr. Edwyna Shell   CATARACT EXTRACTION Bilateral 12/2016   COLONOSCOPY  2012   patchy increased intraepithelial lymphocytes - draelos   ESOPHAGOGASTRODUODENOSCOPY     multiple   FISSURECTOMY     INTRAMEDULLARY (IM) NAIL INTERTROCHANTERIC Left 05/09/2021   Procedure: INTRAMEDULLARY (IM) NAIL INTERTROCHANTRIC;  Surgeon:  Meredith Pel, MD;  Location: [facility];  Service: Orthopedics;  Laterality: Left;   IR KYPHO EA ADDL LEVEL THORACIC OR LUMBAR  06/26/2021   IR KYPHO THORACIC WITH BONE BIOPSY  06/26/2021   WRIST SURGERY Left    due to fracture      reports that she quit smoking about 40 years ago. Her smoking use included cigarettes. She started smoking about 65 years ago. She smoked an average of 1 pack per day. She has never used smokeless tobacco. She reports that she does not drink alcohol and does not use drugs. Social History   Socioeconomic History   Marital status: Widowed    Spouse name: Not on file   Number of children: 0   Years of  education: Not on file   Highest education level: Some college, no degree  Occupational History   Occupation: Retired  Tobacco Use   Smoking status: Former    Packs/day: 1.00    Types: Cigarettes    Start date: 02/18/1956    Quit date: 02/17/1981    Years since quitting: 40.4   Smokeless tobacco: Never  Vaping Use   Vaping Use: Never used  Substance and Sexual Activity   Alcohol use: No    Alcohol/week: 0.0 standard drinks of alcohol   Drug use: No   Sexual activity: Not on file  Other Topics Concern   Not on file  Social History Narrative   Social History     Marital status: Widowed           Spouse name:                        Years of education:  1 year college              Number of children:0           Widowed no children      Occupational History: Stage manager, [employer] manager care, [employer].)     None on file      Social History Main Topics     Smoking status: Former Smoker                                             Packs/day: 1.00      Years: 0.00            Types: Cigarettes        Smokeless tobacco: Not on file                        Alcohol use: No               Drug use: No               Sexual activity: Not on file            Does not drink caffeine, but does eat chocolate.   Does live in an apartment ([address]) retirement community does exercise : walks daily   Right handed         Social Determinants of Health   Financial Resource Strain: Not on file  Food Insecurity: Not on file  Transportation Needs: Not on file  Physical Activity: Not on file  Stress: Not on file  Social Connections: Somewhat Isolated (03/13/2017)   Social  Connection and Isolation Panel [NHANES]    Frequency of Communication with Friends and Family: More than three times a week    Frequency of Social Gatherings with Friends and Family: More than three times a week    Attends Religious Services: More than 4 times per year    Active Member of [employer]  or Organizations: No    Attends [employer]er[employer] or Organization Meetings: Never    Marital Status: Widowed  Intimate Partner Violence: Not on file   Functional Status Survey:    Allergies  Allergen Reactions   Cat Hair Extract Other (See Comments)    Patient states that it causes her eyes to swell shut   Cefaclor Other (See Comments)    Unknown reaction   Codeine Nausea Only   Fish Allergy Nausea Only   Iodinated Contrast Media Other (See Comments)    Unknown reaction     Other Other (See Comments)    Seeds -- Reflux   Penicillins Itching   Percocet [Oxycodone-Acetaminophen] Nausea And Vomiting   Red Dye Other (See Comments)    Reflux   Senna-Docusate Sodium [Sennosides-Docusate Sodium] Other (See Comments)    Severe stomach pains   Shrimp (Diagnostic) Nausea Only    Stomach pain    Tomato Other (See Comments)    Reflux   Hm Lidocaine Patch [Lidocaine] Rash    Pertinent  Health Maintenance Due  Topic Date Due   MAMMOGRAM  08/13/2017   URINE MICROALBUMIN  04/19/2021   INFLUENZA VACCINE  09/17/2021   HEMOGLOBIN A1C  10/26/2021   FOOT EXAM  04/11/2022   OPHTHALMOLOGY EXAM  05/07/2022   DEXA SCAN  Completed    Medications: Allergies as of 07/30/2021       Reactions   Cat Hair Extract Other (See Comments)   Patient states that it causes her eyes to swell shut   Cefaclor Other (See Comments)   Unknown reaction   Codeine Nausea Only   Fish Allergy Nausea Only   Iodinated Contrast Media Other (See Comments)   Unknown reaction   Other Other (See Comments)   Seeds -- Reflux   Penicillins Itching   Percocet [oxycodone-acetaminophen] Nausea And Vomiting   Red Dye Other (See Comments)   Reflux   Senna-docusate Sodium [sennosides-docusate Sodium] Other (See Comments)   Severe stomach pains   Shrimp (diagnostic) Nausea Only   Stomach pain   Tomato Other (See Comments)   Reflux   Hm Lidocaine Patch [lidocaine] Rash        Medication List        Accurate as of July 30, 2021  2:10 PM. If you have any questions, ask your nurse or doctor.          acetaminophen 500 MG tablet Commonly known as: TYLENOL Take 1,000 mg by mouth every 8 (eight) hours as needed for mild pain.   atorvastatin 10 MG tablet Commonly known as: LIPITOR Take by mouth daily. 1/2 tablet   BOOST GLUCOSE CONTROL PO Take 1 Package by mouth daily.   Caltrate 600+D Plus Minerals 600-800 MG-UNIT Chew Chew 2 each by mouth daily.   carbamazepine 200 MG tablet Commonly known as: TEGRETOL Take 200 mg by mouth daily. 1 & 1/2 tablets   cetirizine 10 MG tablet Commonly known as: ZYRTEC Take 10 mg by mouth daily.   clonazePAM 1 MG tablet Commonly known as: KLONOPIN Take 1 tablet (1 mg total) by mouth at bedtime.   HYDROcodone-acetaminophen 5-325 MG tablet Commonly known as: NORCO/VICODIN  Take 1 tablet by mouth every 4 (four) hours as needed for moderate pain.   hydrocortisone 1 % lotion Apply topically 3 (three) times daily.   meclizine 12.5 MG tablet Commonly known as: ANTIVERT Take 12.5 mg by mouth 2 (two) times daily.   polyethylene glycol powder 17 GM/SCOOP powder Commonly known as: GLYCOLAX/MIRALAX Take 17 g by mouth daily.   PRESERVISION AREDS PO Take 1 tablet by mouth 2 (two) times daily.   senna-docusate 8.6-50 MG tablet Commonly known as: Senokot-S Take 2 tablets by mouth 2 (two) times daily.   Soothe XP Soln Place 1 drop into both eyes in the morning, at noon, in the evening, and at bedtime.   Vitamin D (Ergocalciferol) 1.25 MG (50000 UNIT) Caps capsule Commonly known as: DRISDOL Take 50,000 Units by mouth every 7 (seven) days.        Review of Systems  Vitals:   07/30/21 1400  BP: (!) 100/57  Pulse: 81  Resp: 18  Temp: 98.2 F (36.8 C)  SpO2: 99%  Weight: 103 lb 4.8 oz (46.9 kg)  Height: 5\' 5"  (1.651 m)   Body mass index is 17.19 kg/m. Physical Exam Vitals reviewed.  Constitutional:      Appearance: Normal appearance.  HENT:      Head: Normocephalic.     Nose: Nose normal.     Mouth/Throat:     Mouth: Mucous membranes are moist.     Pharynx: Oropharynx is clear.  Eyes:     Pupils: Pupils are equal, round, and reactive to light.  Cardiovascular:     Rate and Rhythm: Normal rate and regular rhythm.     Pulses: Normal pulses.     Heart sounds: Normal heart sounds. No murmur heard. Pulmonary:     Effort: Pulmonary effort is normal.     Breath sounds: Normal breath sounds.  Abdominal:     General: Abdomen is flat. Bowel sounds are normal.     Palpations: Abdomen is soft.  Musculoskeletal:        General: No swelling.     Cervical back: Neck supple.  Skin:    General: Skin is warm.  Neurological:     General: No focal deficit present.     Mental Status: She is alert and oriented to person, place, and time.     Comments: Mild Nystagmus in Right side Did not c/o Vertigo on changing the position   Psychiatric:        Mood and Affect: Mood normal.        Thought Content: Thought content normal.     Labs reviewed: Basic Metabolic Panel: Recent Labs    06/21/21 1145 06/22/21 0312 06/26/21 0056 07/05/21 0000  NA 130* 133* 130* 136*  K 4.2 3.7 4.0 4.3  CL 94* 100 97* 97*  CO2 28 28 26  27*  GLUCOSE 170* 155* 186*  --   BUN 13 9 13 13   CREATININE 0.60 0.52 0.54 0.5  CALCIUM 9.4 8.7* 9.2 8.9   Liver Function Tests: Recent Labs    08/15/20 0813 12/21/20 0000 05/16/21 0000 06/21/21 1145  AST 18 17 28 30   ALT 16 16 30  32  ALKPHOS  --  38 47 99  BILITOT 0.5  --   --  0.5  PROT 6.2  --   --  6.6  ALBUMIN  --  3.8 3.3* 4.3   No results for input(s): "LIPASE", "AMYLASE" in the last 8760 hours. No results for input(s): "AMMONIA" in the last  8760 hours. CBC: Recent Labs    05/12/21 0506 05/16/21 0000 06/21/21 1145 06/26/21 0056 07/05/21 0000  WBC 7.6   < > 10.0 6.1 4.9  NEUTROABS  --    < > 8.3* 3.9 2,685.00  HGB 9.4*   < > 13.0 13.1 12.9  HCT 29.2*   < > 39.7 38.2 39  MCV 99.7  --  97.3  96.5  --   PLT 152   < > 215 194 300   < > = values in this interval not displayed.   Cardiac Enzymes: No results for input(s): "CKTOTAL", "CKMB", "CKMBINDEX", "TROPONINI" in the last 8760 hours. BNP: Invalid input(s): "POCBNP" CBG: Recent Labs    05/12/21 2113 05/13/21 0802 05/13/21 1129  GLUCAP 171* 116* 128*    Procedures and Imaging Studies During Stay: No results found.  Assessment/Plan:   1. S/P kyphoplasty Doing well with pain control Walks with her walker On Norco PRn  2. Vertigo Continue Meclizine She does have appointment twith Neuro to go over her Symptoms MRI negative  3. Insomnia, unspecified type On Klonopin   4. 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 S/P ORIF (open reduction internal fixation) fracture Doing well with her walker Pain seems controlled  6. Hyperlipidemia, unspecified hyperlipidemia type On statin 7 Osteoporosis Has been on Reclast before Needs to be on Further treatment and DEXA  Future labs/tests needed:

## 2021-07-31 DIAGNOSIS — S32000D Wedge compression fracture of unspecified lumbar vertebra, subsequent encounter for fracture with routine healing: Secondary | ICD-10-CM | POA: Diagnosis not present

## 2021-07-31 DIAGNOSIS — S32010D Wedge compression fracture of first lumbar vertebra, subsequent encounter for fracture with routine healing: Secondary | ICD-10-CM | POA: Diagnosis not present

## 2021-07-31 DIAGNOSIS — R296 Repeated falls: Secondary | ICD-10-CM | POA: Diagnosis not present

## 2021-07-31 DIAGNOSIS — R29898 Other symptoms and signs involving the musculoskeletal system: Secondary | ICD-10-CM | POA: Diagnosis not present

## 2021-07-31 DIAGNOSIS — Z9181 History of falling: Secondary | ICD-10-CM | POA: Diagnosis not present

## 2021-07-31 DIAGNOSIS — M810 Age-related osteoporosis without current pathological fracture: Secondary | ICD-10-CM | POA: Diagnosis not present

## 2021-07-31 DIAGNOSIS — M6281 Muscle weakness (generalized): Secondary | ICD-10-CM | POA: Diagnosis not present

## 2021-07-31 DIAGNOSIS — R2689 Other abnormalities of gait and mobility: Secondary | ICD-10-CM | POA: Diagnosis not present

## 2021-07-31 DIAGNOSIS — R41841 Cognitive communication deficit: Secondary | ICD-10-CM | POA: Diagnosis not present

## 2021-07-31 DIAGNOSIS — R42 Dizziness and giddiness: Secondary | ICD-10-CM | POA: Diagnosis not present

## 2021-08-01 DIAGNOSIS — M6281 Muscle weakness (generalized): Secondary | ICD-10-CM | POA: Diagnosis not present

## 2021-08-01 DIAGNOSIS — R296 Repeated falls: Secondary | ICD-10-CM | POA: Diagnosis not present

## 2021-08-01 DIAGNOSIS — R41841 Cognitive communication deficit: Secondary | ICD-10-CM | POA: Diagnosis not present

## 2021-08-01 DIAGNOSIS — R29898 Other symptoms and signs involving the musculoskeletal system: Secondary | ICD-10-CM | POA: Diagnosis not present

## 2021-08-01 DIAGNOSIS — R42 Dizziness and giddiness: Secondary | ICD-10-CM | POA: Diagnosis not present

## 2021-08-01 DIAGNOSIS — S32000D Wedge compression fracture of unspecified lumbar vertebra, subsequent encounter for fracture with routine healing: Secondary | ICD-10-CM | POA: Diagnosis not present

## 2021-08-02 DIAGNOSIS — R42 Dizziness and giddiness: Secondary | ICD-10-CM | POA: Diagnosis not present

## 2021-08-02 DIAGNOSIS — M6281 Muscle weakness (generalized): Secondary | ICD-10-CM | POA: Diagnosis not present

## 2021-08-02 DIAGNOSIS — R29898 Other symptoms and signs involving the musculoskeletal system: Secondary | ICD-10-CM | POA: Diagnosis not present

## 2021-08-02 DIAGNOSIS — R41841 Cognitive communication deficit: Secondary | ICD-10-CM | POA: Diagnosis not present

## 2021-08-02 DIAGNOSIS — S32000D Wedge compression fracture of unspecified lumbar vertebra, subsequent encounter for fracture with routine healing: Secondary | ICD-10-CM | POA: Diagnosis not present

## 2021-08-02 DIAGNOSIS — R296 Repeated falls: Secondary | ICD-10-CM | POA: Diagnosis not present

## 2021-08-04 DIAGNOSIS — M6281 Muscle weakness (generalized): Secondary | ICD-10-CM | POA: Diagnosis not present

## 2021-08-04 DIAGNOSIS — R296 Repeated falls: Secondary | ICD-10-CM | POA: Diagnosis not present

## 2021-08-04 DIAGNOSIS — R29898 Other symptoms and signs involving the musculoskeletal system: Secondary | ICD-10-CM | POA: Diagnosis not present

## 2021-08-04 DIAGNOSIS — S32000D Wedge compression fracture of unspecified lumbar vertebra, subsequent encounter for fracture with routine healing: Secondary | ICD-10-CM | POA: Diagnosis not present

## 2021-08-04 DIAGNOSIS — R42 Dizziness and giddiness: Secondary | ICD-10-CM | POA: Diagnosis not present

## 2021-08-04 DIAGNOSIS — R41841 Cognitive communication deficit: Secondary | ICD-10-CM | POA: Diagnosis not present

## 2021-08-06 DIAGNOSIS — S32000D Wedge compression fracture of unspecified lumbar vertebra, subsequent encounter for fracture with routine healing: Secondary | ICD-10-CM | POA: Diagnosis not present

## 2021-08-06 DIAGNOSIS — R29898 Other symptoms and signs involving the musculoskeletal system: Secondary | ICD-10-CM | POA: Diagnosis not present

## 2021-08-06 DIAGNOSIS — R41841 Cognitive communication deficit: Secondary | ICD-10-CM | POA: Diagnosis not present

## 2021-08-06 DIAGNOSIS — M6281 Muscle weakness (generalized): Secondary | ICD-10-CM | POA: Diagnosis not present

## 2021-08-06 DIAGNOSIS — R42 Dizziness and giddiness: Secondary | ICD-10-CM | POA: Diagnosis not present

## 2021-08-06 DIAGNOSIS — R296 Repeated falls: Secondary | ICD-10-CM | POA: Diagnosis not present

## 2021-08-07 ENCOUNTER — Other Ambulatory Visit: Payer: Self-pay | Admitting: Adult Health

## 2021-08-07 DIAGNOSIS — R296 Repeated falls: Secondary | ICD-10-CM | POA: Diagnosis not present

## 2021-08-07 DIAGNOSIS — R29898 Other symptoms and signs involving the musculoskeletal system: Secondary | ICD-10-CM | POA: Diagnosis not present

## 2021-08-07 DIAGNOSIS — M6281 Muscle weakness (generalized): Secondary | ICD-10-CM | POA: Diagnosis not present

## 2021-08-07 DIAGNOSIS — S32000D Wedge compression fracture of unspecified lumbar vertebra, subsequent encounter for fracture with routine healing: Secondary | ICD-10-CM | POA: Diagnosis not present

## 2021-08-07 DIAGNOSIS — R41841 Cognitive communication deficit: Secondary | ICD-10-CM | POA: Diagnosis not present

## 2021-08-07 DIAGNOSIS — G4700 Insomnia, unspecified: Secondary | ICD-10-CM

## 2021-08-07 DIAGNOSIS — R42 Dizziness and giddiness: Secondary | ICD-10-CM | POA: Diagnosis not present

## 2021-08-07 MED ORDER — CLONAZEPAM 1 MG PO TABS: 1.0000 mg | ORAL_TABLET | Freq: Every day | ORAL | 0 refills | Status: DC

## 2021-08-08 ENCOUNTER — Encounter: Payer: Self-pay | Admitting: Neurology

## 2021-08-08 ENCOUNTER — Ambulatory Visit (INDEPENDENT_AMBULATORY_CARE_PROVIDER_SITE_OTHER): Payer: Medicare Other | Admitting: Neurology

## 2021-08-08 VITALS — BP 129/63 | HR 86 | Ht 65.0 in | Wt 107.0 lb

## 2021-08-08 DIAGNOSIS — R42 Dizziness and giddiness: Secondary | ICD-10-CM

## 2021-08-08 DIAGNOSIS — S32000D Wedge compression fracture of unspecified lumbar vertebra, subsequent encounter for fracture with routine healing: Secondary | ICD-10-CM | POA: Diagnosis not present

## 2021-08-08 DIAGNOSIS — R296 Repeated falls: Secondary | ICD-10-CM | POA: Diagnosis not present

## 2021-08-08 DIAGNOSIS — M6281 Muscle weakness (generalized): Secondary | ICD-10-CM | POA: Diagnosis not present

## 2021-08-08 DIAGNOSIS — G40822 Epileptic spasms, not intractable, without status epilepticus: Secondary | ICD-10-CM

## 2021-08-08 DIAGNOSIS — F5101 Primary insomnia: Secondary | ICD-10-CM | POA: Diagnosis not present

## 2021-08-08 DIAGNOSIS — R41841 Cognitive communication deficit: Secondary | ICD-10-CM | POA: Diagnosis not present

## 2021-08-08 DIAGNOSIS — R29898 Other symptoms and signs involving the musculoskeletal system: Secondary | ICD-10-CM | POA: Diagnosis not present

## 2021-08-08 NOTE — Patient Instructions (Signed)
Continue current medications  Continue with Meclizine as needed  Increase fluid intake  Return as needed

## 2021-08-08 NOTE — Progress Notes (Signed)
[facility]  PATIENT: Erin ChickBennie Ochoa DOB: 1940-06-01  REQUESTING CLINICIAN: Mahlon GammonGupta, Anjali L, MD HISTORY FROM: Patient  REASON FOR VISIT: Dizziness    HISTORICAL  CHIEF COMPLAINT:  Chief Complaint  Patient presents with   Consult    Room 12, with friend  NX Gleb Mcguire 2022 / Internal referral for vertigo/sched with friends  Pt c/o vertigo spell 2 weeks ago, reports some dizziness, On Meclizine which helps some    INTERVAL HISTORY 08/08/21:  Patient presents today for follow-up, she is accompanied by her friend.  She reported back in March she fell and broke her left femur, was in the hospital for couple weeks then discharged home.  At home she had a severe bout of vertigo described as room spinning sensation and then fell and at that time had a lumbar fracture, she was admitted again had lumbar surgery, sent to nursing home where she was getting rehab and now she is in an assisted living facility trying to get home.  She reported her pain is controlled, she gets physical therapy daily but continued to have episodes of dizziness that she described as feeling woozy, very mild for which she does take meclizine as needed.  She is not experiencing the vertigo sensation anymore, it was only a one-time event.  She is using a walker and denies any additional falls.   Denies any additional seizures, compliant with the Tegretol   HISTORY OF PRESENT ILLNESS:  This is a 81 year old woman with past medical history of chronic insomnia, seizure disorder, and macular degeneration who is presenting for management of her chronic insomnia.  Patient reports suffering from insomnia for more than 15 years initially she was started on Klonopin low-dose that seems to help with her insomnia.  She reported taking a quarter of 0.5 mg initially but due to increased tolerance she has increased the medication up to the point now that she is taking 0.5 mg nightly without any improvement.  In 2019 she has  seen Dr. Lucia GaskinsAhern for the same, she recommended referral to sleep/insomnia counseling for further management but patient has not followed up.  She did try Ambien without much relief for her sleep.  On average she is sleeping 2 to 3 hours per night if she can get sleep.  Patient reported some night she does not have any sleep and other nights she can sleep up to 6-6 and half hours.  If he does not have a good night sleep the next morning she is complaining of dizziness and lightheadedness, denies any fall.    For history of seizures, reported seizures are well controlled with Tegretol brand-name, last seizure was in 1977.  She is compliant with Tegretol 300 mg daily, denies any side effects.    She currently lives in an assisted living facility   Handedness: Right handed   Seizure Type: Generalized   Current frequency: last seizure 1977  Any injuries from seizures: None   Seizure risk factors: Unclear   Previous ASMs: Dilantin, Tegretol   Currenty ASMs: Tegretol 300 mg daily   ASMs side effects: None   Brain Images: N/A  Previous EEGs: N/A   OTHER MEDICAL CONDITIONS: Insomnia, macular degeneration,   REVIEW OF SYSTEMS: Full 14 system review of systems performed and negative with exception of: as noted in the HPI  ALLERGIES: Allergies  Allergen Reactions   Cat Hair Extract Other (See Comments)    Patient states that it causes her eyes to swell shut   Cefaclor Other (  See Comments)    Unknown reaction   Codeine Nausea Only   Fish Allergy Nausea Only   Iodinated Contrast Media Other (See Comments)    Unknown reaction     Other Other (See Comments)    Seeds -- Reflux   Penicillins Itching   Percocet [Oxycodone-Acetaminophen] Nausea And Vomiting   Red Dye Other (See Comments)    Reflux   Senna-Docusate Sodium [Sennosides-Docusate Sodium] Other (See Comments)    Severe stomach pains   Shrimp (Diagnostic) Nausea Only    Stomach pain    Tomato Other (See Comments)    Reflux    Hm Lidocaine Patch [Lidocaine] Rash    HOME MEDICATIONS: Outpatient Medications Prior to Visit  Medication Sig Dispense Refill   acetaminophen (TYLENOL) 500 MG tablet Take 1,000 mg by mouth every 8 (eight) hours as needed for mild pain.     Artificial Tear Solution (SOOTHE XP) SOLN Place 1 drop into both eyes in the morning, at noon, in the evening, and at bedtime.     atorvastatin (LIPITOR) 10 MG tablet Take by mouth daily. 1/2 tablet     Calcium Carbonate-Vit D-Min (CALTRATE 600+D PLUS MINERALS) 600-800 MG-UNIT CHEW Chew 2 each by mouth daily.     carbamazepine (TEGRETOL) 200 MG tablet Take 200 mg by mouth daily. 1 & 1/2 tablets     cholestyramine light (PREVALITE) 4 g packet Take 4 g by mouth 2 (two) times daily.     clonazePAM (KLONOPIN) 1 MG tablet Take 1 tablet (1 mg total) by mouth at bedtime. 30 tablet 0   HYDROcodone-acetaminophen (NORCO/VICODIN) 5-325 MG tablet Take 1 tablet by mouth every 4 (four) hours as needed for moderate pain. 30 tablet 0   meclizine (ANTIVERT) 12.5 MG tablet Take 12.5 mg by mouth 2 (two) times daily.     Multiple Vitamins-Minerals (PRESERVISION AREDS PO) Take 1 tablet by mouth 2 (two) times daily.     Nutritional Supplements (BOOST GLUCOSE CONTROL PO) Take 1 Package by mouth daily.     polyethylene glycol powder (GLYCOLAX/MIRALAX) 17 GM/SCOOP powder Take 17 g by mouth daily.     senna-docusate (SENOKOT-S) 8.6-50 MG tablet Take 2 tablets by mouth 2 (two) times daily.     Vitamin D, Ergocalciferol, (DRISDOL) 1.25 MG (50000 UNIT) CAPS capsule Take 50,000 Units by mouth every 7 (seven) days.     cetirizine (ZYRTEC) 10 MG tablet Take 10 mg by mouth daily.     hydrocortisone 1 % lotion Apply topically 3 (three) times daily. 118 mL 0   No facility-administered medications prior to visit.    PAST MEDICAL HISTORY: Past Medical History:  Diagnosis Date   Anxiety    Cataract    Chronic constipation    Depression    Osteoporosis    Seizures ([name])     epilepsy  last seizure 1977    PAST SURGICAL HISTORY: Past Surgical History:  Procedure Laterality Date   ABDOMINAL HYSTERECTOMY  1995   Dr. Carlis Abbott   CATARACT EXTRACTION Bilateral 12/2016   COLONOSCOPY  2012   patchy increased intraepithelial lymphocytes - draelos   ESOPHAGOGASTRODUODENOSCOPY     multiple   FISSURECTOMY     INTRAMEDULLARY (IM) NAIL INTERTROCHANTERIC Left 05/09/2021   Procedure: INTRAMEDULLARY (IM) NAIL INTERTROCHANTRIC;  Surgeon: Cammy Copa, MD;  Location: [facility] OR;  Service: Orthopedics;  Laterality: Left;   IR KYPHO EA ADDL LEVEL THORACIC OR LUMBAR  06/26/2021   IR KYPHO THORACIC WITH BONE BIOPSY  06/26/2021   WRIST  SURGERY Left    due to fracture    FAMILY HISTORY: Family History  Problem Relation Age of Onset   Alcohol abuse Father    Diabetes Father    Diabetes Maternal Grandmother    Diabetes Paternal Grandmother    Colon cancer Maternal Grandfather    Esophageal cancer Neg Hx    Rectal cancer Neg Hx    Stomach cancer Neg Hx     SOCIAL HISTORY: Social History   Socioeconomic History   Marital status: Widowed    Spouse name: Not on file   Number of children: 0   Years of education: Not on file   Highest education level: Some college, no degree  Occupational History   Occupation: Retired  Tobacco Use   Smoking status: Former    Packs/day: 1.00    Types: Cigarettes    Start date: 02/18/1956    Quit date: 02/17/1981    Years since quitting: 40.4   Smokeless tobacco: Never  Vaping Use   Vaping Use: Never used  Substance and Sexual Activity   Alcohol use: No    Alcohol/week: 0.0 standard drinks of alcohol   Drug use: No   Sexual activity: Not on file  Other Topics Concern   Not on file  Social History Narrative   Social History     Marital status: Widowed           Spouse name:                        Years of education:  1 year college              Number of children:0           Widowed no children      Occupational History:  Editor, commissioning, Adm. Therapist, music care, [employer]     None on file      Social History Main Topics     Smoking status: Former Smoker                                             Packs/day: 1.00      Years: 0.00            Types: Cigarettes        Smokeless tobacco: Not on file                        Alcohol use: No               Drug use: No               Sexual activity: Not on file            Does not drink caffeine, but does eat chocolate.   Does live in an apartment ([address]) retirement community does exercise : walks daily   Right handed         Social Determinants of Health   Financial Resource Strain: Low Risk  (03/13/2017)   Overall Financial Resource Strain (CARDIA)    Difficulty of Paying Living Expenses: Not hard at all  Food Insecurity: No Food Insecurity (03/13/2017)   Hunger Vital Sign    Worried About Running Out of Food in the Last Year: Never true    Ran Out of Food in the  Last Year: Never true  Transportation Needs: No Transportation Needs (03/13/2017)   PRAPARE - Administrator, Civil Service (Medical): No    Lack of Transportation (Non-Medical): No  Physical Activity: Sufficiently Active (03/13/2017)   Exercise Vital Sign    Days of Exercise per Week: 5 days    Minutes of Exercise per Session: 30 min  Stress: No Stress Concern Present (03/13/2017)   [employer] of Occupational Health - Occupational Stress Questionnaire    Feeling of Stress : Only a little  Social Connections: Somewhat Isolated (03/13/2017)   Social Connection and Isolation Panel [NHANES]    Frequency of Communication with Friends and Family: More than three times a week    Frequency of Social Gatherings with Friends and Family: More than three times a week    Attends Religious Services: More than 4 times per year    Active Member of [employer] or Organizations: No    Attends Banker Meetings: Never    Marital Status: Widowed  Intimate  Partner Violence: Not At Risk (03/13/2017)   Humiliation, Afraid, Rape, and Kick questionnaire    Fear of Current or Ex-Partner: No    Emotionally Abused: No    Physically Abused: No    Sexually Abused: No    PHYSICAL EXAM  GENERAL EXAM/CONSTITUTIONAL: Vitals:  Vitals:   08/08/21 1003  BP: 129/63  Pulse: 86  Weight: 107 lb (48.5 kg)  Height: 5\' 5"  (1.651 m)   Body mass index is 17.81 kg/m. Wt Readings from Last 3 Encounters:  08/08/21 107 lb (48.5 kg)  07/30/21 103 lb 4.8 oz (46.9 kg)  07/11/21 100 lb 6.4 oz (45.5 kg)   Patient is in no distress; well developed, nourished and groomed; neck is supple  EYES: Pupils round and reactive to light, Visual fields full to confrontation, Extraocular movements intacts,  No results found.  MUSCULOSKELETAL: Gait, strength, tone, movements noted in Neurologic exam below  NEUROLOGIC: MENTAL STATUS:      No data to display         awake, alert, oriented to person, place and time recent and remote memory intact normal attention and concentration language fluent, comprehension intact, naming intact fund of knowledge appropriate  CRANIAL NERVE:  2nd, 3rd, 4th, 6th - pupils equal and reactive to light, visual fields full to confrontation, extraocular muscles intact, no nystagmus 5th - facial sensation symmetric 7th - facial strength symmetric 8th - hearing intact 9th - palate elevates symmetrically, uvula midline 11th - shoulder shrug symmetric 12th - tongue protrusion midline  MOTOR:  normal bulk and tone, full strength in the BUE, BLE  SENSORY:  normal and symmetric to light touch, pinprick, temperature, vibration  COORDINATION:  finger-nose-finger, fine finger movements normal  REFLEXES:  deep tendon reflexes present and symmetric  GAIT/STATION:  Normal with the walker    DIAGNOSTIC DATA (LABS, IMAGING, TESTING) - I reviewed patient records, labs, notes, testing and imaging myself where available.  Lab  Results  Component Value Date   WBC 4.9 07/05/2021   HGB 12.9 07/05/2021   HCT 39 07/05/2021   MCV 96.5 06/26/2021   PLT 300 07/05/2021      Component Value Date/Time   NA 136 (A) 07/05/2021 0000   K 4.3 07/05/2021 0000   CL 97 (A) 07/05/2021 0000   CO2 27 (A) 07/05/2021 0000   GLUCOSE 186 (H) 06/26/2021 0056   BUN 13 07/05/2021 0000   CREATININE 0.5 07/05/2021 0000   CREATININE 0.54 06/26/2021  0056   CREATININE 0.61 08/15/2020 0813   CALCIUM 8.9 07/05/2021 0000   PROT 6.6 06/21/2021 1145   ALBUMIN 4.3 06/21/2021 1145   AST 30 06/21/2021 1145   ALT 32 06/21/2021 1145   ALKPHOS 99 06/21/2021 1145   BILITOT 0.5 06/21/2021 1145   GFRNONAA >60 06/26/2021 0056   GFRNONAA 86 08/15/2020 0813   GFRAA 99 08/15/2020 0813   Lab Results  Component Value Date   CHOL 153 08/15/2020   HDL 48 (L) 08/15/2020   LDLCALC 79 08/15/2020   TRIG 159 (H) 08/15/2020   Lab Results  Component Value Date   HGBA1C 5.7 (H) 04/25/2021   No results found for: "VITAMINB12" Lab Results  Component Value Date   TSH 3.20 12/21/2020    MRI Brain 06/25/21 1. No acute finding, including infarct. 2. Generalized brain atrophy  ASSESSMENT AND PLAN  81 y.o. year old female  with with past medical history of primary insomnia, seizure disorder and macular degeneration, and falls s/p left hip and lumbar fractures who is presenting for dizziness.  Patient describes dizziness as feeling woozy, denies any room spinning sensation.  Reported dizziness is occasional, currently she is not symptomatic.  If symptomatic she does take meclizine which seems to help.  On further note, patient reports poor water intake.  I did advise her to increase her water intake, to mix it with Gatorade or Powerade if needed.  Also upon standing up, to take her time prior to taking her first step. She voices understanding.  Ask her to continue following up with her primary care doctor and return as needed.   1. Dizziness   2.  Nonintractable epileptic spasms without status epilepticus ([name])   3. Primary insomnia     Patient Instructions  Continue current medications  Continue with Meclizine as needed  Increase fluid intake  Return as needed    Per [facility] statutes, patients with seizures are not allowed to drive until they have been seizure-free for six months.  Other recommendations include using caution when using heavy equipment or power tools. Avoid working on ladders or at heights. Take showers instead of baths.  Do not swim alone.  Ensure the water temperature is not too high on the home water heater. Do not go swimming alone. Do not lock yourself in a room alone (i.e. bathroom). When caring for infants or small children, sit down when holding, feeding, or changing them to minimize risk of injury to the child in the event you have a seizure. Maintain good sleep hygiene. Avoid alcohol.  Also recommend adequate sleep, hydration, good diet and minimize stress.   During the Seizure  - First, ensure adequate ventilation and place patients on the floor on their left side  Loosen clothing around the neck and ensure the airway is patent. If the patient is clenching the teeth, do not force the mouth open with any object as this can cause severe damage - Remove all items from the surrounding that can be hazardous. The patient may be oblivious to what's happening and may not even know what he or she is doing. If the patient is confused and wandering, either gently guide him/her away and block access to outside areas - Reassure the individual and be comforting - Call 911. In most cases, the seizure ends before EMS arrives. However, there are cases when seizures may last over 3 to 5 minutes. Or the individual may have developed breathing difficulties or severe injuries. If a pregnant  patient or a person with diabetes develops a seizure, it is prudent to call an ambulance. - Finally, if the patient does not regain  full consciousness, then call EMS. Most patients will remain confused for about 45 to 90 minutes after a seizure, so you must use judgment in calling for help. - Avoid restraints but make sure the patient is in a bed with padded side rails - Place the individual in a lateral position with the neck slightly flexed; this will help the saliva drain from the mouth and prevent the tongue from falling backward - Remove all nearby furniture and other hazards from the area - Provide verbal assurance as the individual is regaining consciousness - Provide the patient with privacy if possible - Call for help and start treatment as ordered by the caregiver   After the Seizure (Postictal Stage)  After a seizure, most patients experience confusion, fatigue, muscle pain and/or a headache. Thus, one should permit the individual to sleep. For the next few days, reassurance is essential. Being calm and helping reorient the person is also of importance.  Most seizures are painless and end spontaneously. Seizures are not harmful to others but can lead to complications such as stress on the lungs, brain and the heart. Individuals with prior lung problems may develop labored breathing and respiratory distress.     No orders of the defined types were placed in this encounter.    No orders of the defined types were placed in this encounter.    Return if symptoms worsen or fail to improve.  I have spent a total of 30 minutes dedicated to this patient today, preparing to see patient, performing a medically appropriate examination and evaluation, ordering tests and/or medications and procedures, and counseling and educating the patient/family/caregiver; independently interpreting result and communicating results to the family/patient/caregiver; and documenting clinical information in the electronic medical record.   Windell Norfolk, MD 08/08/2021, 1:06 PM  [facility] [address] [place], [place] [address] [phone]

## 2021-08-09 DIAGNOSIS — R296 Repeated falls: Secondary | ICD-10-CM | POA: Diagnosis not present

## 2021-08-09 DIAGNOSIS — R42 Dizziness and giddiness: Secondary | ICD-10-CM | POA: Diagnosis not present

## 2021-08-09 DIAGNOSIS — R41841 Cognitive communication deficit: Secondary | ICD-10-CM | POA: Diagnosis not present

## 2021-08-09 DIAGNOSIS — R29898 Other symptoms and signs involving the musculoskeletal system: Secondary | ICD-10-CM | POA: Diagnosis not present

## 2021-08-09 DIAGNOSIS — M6281 Muscle weakness (generalized): Secondary | ICD-10-CM | POA: Diagnosis not present

## 2021-08-09 DIAGNOSIS — S32000D Wedge compression fracture of unspecified lumbar vertebra, subsequent encounter for fracture with routine healing: Secondary | ICD-10-CM | POA: Diagnosis not present

## 2021-08-12 DIAGNOSIS — R41841 Cognitive communication deficit: Secondary | ICD-10-CM | POA: Diagnosis not present

## 2021-08-12 DIAGNOSIS — S32000D Wedge compression fracture of unspecified lumbar vertebra, subsequent encounter for fracture with routine healing: Secondary | ICD-10-CM | POA: Diagnosis not present

## 2021-08-12 DIAGNOSIS — M6281 Muscle weakness (generalized): Secondary | ICD-10-CM | POA: Diagnosis not present

## 2021-08-12 DIAGNOSIS — R296 Repeated falls: Secondary | ICD-10-CM | POA: Diagnosis not present

## 2021-08-12 DIAGNOSIS — R29898 Other symptoms and signs involving the musculoskeletal system: Secondary | ICD-10-CM | POA: Diagnosis not present

## 2021-08-12 DIAGNOSIS — R42 Dizziness and giddiness: Secondary | ICD-10-CM | POA: Diagnosis not present

## 2021-08-13 DIAGNOSIS — M6281 Muscle weakness (generalized): Secondary | ICD-10-CM | POA: Diagnosis not present

## 2021-08-13 DIAGNOSIS — R42 Dizziness and giddiness: Secondary | ICD-10-CM | POA: Diagnosis not present

## 2021-08-13 DIAGNOSIS — R41841 Cognitive communication deficit: Secondary | ICD-10-CM | POA: Diagnosis not present

## 2021-08-13 DIAGNOSIS — R29898 Other symptoms and signs involving the musculoskeletal system: Secondary | ICD-10-CM | POA: Diagnosis not present

## 2021-08-13 DIAGNOSIS — R296 Repeated falls: Secondary | ICD-10-CM | POA: Diagnosis not present

## 2021-08-13 DIAGNOSIS — S32000D Wedge compression fracture of unspecified lumbar vertebra, subsequent encounter for fracture with routine healing: Secondary | ICD-10-CM | POA: Diagnosis not present

## 2021-08-14 DIAGNOSIS — R296 Repeated falls: Secondary | ICD-10-CM | POA: Diagnosis not present

## 2021-08-14 DIAGNOSIS — R29898 Other symptoms and signs involving the musculoskeletal system: Secondary | ICD-10-CM | POA: Diagnosis not present

## 2021-08-14 DIAGNOSIS — S32000D Wedge compression fracture of unspecified lumbar vertebra, subsequent encounter for fracture with routine healing: Secondary | ICD-10-CM | POA: Diagnosis not present

## 2021-08-14 DIAGNOSIS — R42 Dizziness and giddiness: Secondary | ICD-10-CM | POA: Diagnosis not present

## 2021-08-14 DIAGNOSIS — M6281 Muscle weakness (generalized): Secondary | ICD-10-CM | POA: Diagnosis not present

## 2021-08-14 DIAGNOSIS — R41841 Cognitive communication deficit: Secondary | ICD-10-CM | POA: Diagnosis not present

## 2021-08-15 DIAGNOSIS — R41841 Cognitive communication deficit: Secondary | ICD-10-CM | POA: Diagnosis not present

## 2021-08-15 DIAGNOSIS — S32000D Wedge compression fracture of unspecified lumbar vertebra, subsequent encounter for fracture with routine healing: Secondary | ICD-10-CM | POA: Diagnosis not present

## 2021-08-15 DIAGNOSIS — R296 Repeated falls: Secondary | ICD-10-CM | POA: Diagnosis not present

## 2021-08-15 DIAGNOSIS — R29898 Other symptoms and signs involving the musculoskeletal system: Secondary | ICD-10-CM | POA: Diagnosis not present

## 2021-08-15 DIAGNOSIS — R42 Dizziness and giddiness: Secondary | ICD-10-CM | POA: Diagnosis not present

## 2021-08-15 DIAGNOSIS — M6281 Muscle weakness (generalized): Secondary | ICD-10-CM | POA: Diagnosis not present

## 2021-08-16 DIAGNOSIS — R42 Dizziness and giddiness: Secondary | ICD-10-CM | POA: Diagnosis not present

## 2021-08-16 DIAGNOSIS — R29898 Other symptoms and signs involving the musculoskeletal system: Secondary | ICD-10-CM | POA: Diagnosis not present

## 2021-08-16 DIAGNOSIS — R41841 Cognitive communication deficit: Secondary | ICD-10-CM | POA: Diagnosis not present

## 2021-08-16 DIAGNOSIS — S32000D Wedge compression fracture of unspecified lumbar vertebra, subsequent encounter for fracture with routine healing: Secondary | ICD-10-CM | POA: Diagnosis not present

## 2021-08-16 DIAGNOSIS — M6281 Muscle weakness (generalized): Secondary | ICD-10-CM | POA: Diagnosis not present

## 2021-08-16 DIAGNOSIS — R296 Repeated falls: Secondary | ICD-10-CM | POA: Diagnosis not present

## 2021-08-19 DIAGNOSIS — M6281 Muscle weakness (generalized): Secondary | ICD-10-CM | POA: Diagnosis not present

## 2021-08-19 DIAGNOSIS — R42 Dizziness and giddiness: Secondary | ICD-10-CM | POA: Diagnosis not present

## 2021-08-19 DIAGNOSIS — R29898 Other symptoms and signs involving the musculoskeletal system: Secondary | ICD-10-CM | POA: Diagnosis not present

## 2021-08-19 DIAGNOSIS — R296 Repeated falls: Secondary | ICD-10-CM | POA: Diagnosis not present

## 2021-08-19 DIAGNOSIS — M810 Age-related osteoporosis without current pathological fracture: Secondary | ICD-10-CM | POA: Diagnosis not present

## 2021-08-19 DIAGNOSIS — R2689 Other abnormalities of gait and mobility: Secondary | ICD-10-CM | POA: Diagnosis not present

## 2021-08-19 DIAGNOSIS — R41841 Cognitive communication deficit: Secondary | ICD-10-CM | POA: Diagnosis not present

## 2021-08-19 DIAGNOSIS — S32010D Wedge compression fracture of first lumbar vertebra, subsequent encounter for fracture with routine healing: Secondary | ICD-10-CM | POA: Diagnosis not present

## 2021-08-19 DIAGNOSIS — Z9181 History of falling: Secondary | ICD-10-CM | POA: Diagnosis not present

## 2021-08-19 DIAGNOSIS — S32000D Wedge compression fracture of unspecified lumbar vertebra, subsequent encounter for fracture with routine healing: Secondary | ICD-10-CM | POA: Diagnosis not present

## 2021-08-20 DIAGNOSIS — R41841 Cognitive communication deficit: Secondary | ICD-10-CM | POA: Diagnosis not present

## 2021-08-20 DIAGNOSIS — R42 Dizziness and giddiness: Secondary | ICD-10-CM | POA: Diagnosis not present

## 2021-08-20 DIAGNOSIS — M6281 Muscle weakness (generalized): Secondary | ICD-10-CM | POA: Diagnosis not present

## 2021-08-20 DIAGNOSIS — R296 Repeated falls: Secondary | ICD-10-CM | POA: Diagnosis not present

## 2021-08-20 DIAGNOSIS — S32000D Wedge compression fracture of unspecified lumbar vertebra, subsequent encounter for fracture with routine healing: Secondary | ICD-10-CM | POA: Diagnosis not present

## 2021-08-20 DIAGNOSIS — R29898 Other symptoms and signs involving the musculoskeletal system: Secondary | ICD-10-CM | POA: Diagnosis not present

## 2021-08-21 ENCOUNTER — Encounter: Payer: Medicare Other | Admitting: Family Medicine

## 2021-08-21 DIAGNOSIS — R41841 Cognitive communication deficit: Secondary | ICD-10-CM | POA: Diagnosis not present

## 2021-08-21 DIAGNOSIS — R42 Dizziness and giddiness: Secondary | ICD-10-CM | POA: Diagnosis not present

## 2021-08-21 DIAGNOSIS — R296 Repeated falls: Secondary | ICD-10-CM | POA: Diagnosis not present

## 2021-08-21 DIAGNOSIS — R29898 Other symptoms and signs involving the musculoskeletal system: Secondary | ICD-10-CM | POA: Diagnosis not present

## 2021-08-21 DIAGNOSIS — M6281 Muscle weakness (generalized): Secondary | ICD-10-CM | POA: Diagnosis not present

## 2021-08-21 DIAGNOSIS — S32000D Wedge compression fracture of unspecified lumbar vertebra, subsequent encounter for fracture with routine healing: Secondary | ICD-10-CM | POA: Diagnosis not present

## 2021-08-22 DIAGNOSIS — S32000D Wedge compression fracture of unspecified lumbar vertebra, subsequent encounter for fracture with routine healing: Secondary | ICD-10-CM | POA: Diagnosis not present

## 2021-08-22 DIAGNOSIS — R29898 Other symptoms and signs involving the musculoskeletal system: Secondary | ICD-10-CM | POA: Diagnosis not present

## 2021-08-22 DIAGNOSIS — R42 Dizziness and giddiness: Secondary | ICD-10-CM | POA: Diagnosis not present

## 2021-08-22 DIAGNOSIS — M6281 Muscle weakness (generalized): Secondary | ICD-10-CM | POA: Diagnosis not present

## 2021-08-22 DIAGNOSIS — R41841 Cognitive communication deficit: Secondary | ICD-10-CM | POA: Diagnosis not present

## 2021-08-22 DIAGNOSIS — R296 Repeated falls: Secondary | ICD-10-CM | POA: Diagnosis not present

## 2021-08-23 DIAGNOSIS — M6281 Muscle weakness (generalized): Secondary | ICD-10-CM | POA: Diagnosis not present

## 2021-08-23 DIAGNOSIS — R42 Dizziness and giddiness: Secondary | ICD-10-CM | POA: Diagnosis not present

## 2021-08-23 DIAGNOSIS — S32000D Wedge compression fracture of unspecified lumbar vertebra, subsequent encounter for fracture with routine healing: Secondary | ICD-10-CM | POA: Diagnosis not present

## 2021-08-23 DIAGNOSIS — R29898 Other symptoms and signs involving the musculoskeletal system: Secondary | ICD-10-CM | POA: Diagnosis not present

## 2021-08-23 DIAGNOSIS — R41841 Cognitive communication deficit: Secondary | ICD-10-CM | POA: Diagnosis not present

## 2021-08-23 DIAGNOSIS — R296 Repeated falls: Secondary | ICD-10-CM | POA: Diagnosis not present

## 2021-08-26 DIAGNOSIS — R42 Dizziness and giddiness: Secondary | ICD-10-CM | POA: Diagnosis not present

## 2021-08-26 DIAGNOSIS — R29898 Other symptoms and signs involving the musculoskeletal system: Secondary | ICD-10-CM | POA: Diagnosis not present

## 2021-08-26 DIAGNOSIS — M6281 Muscle weakness (generalized): Secondary | ICD-10-CM | POA: Diagnosis not present

## 2021-08-26 DIAGNOSIS — R296 Repeated falls: Secondary | ICD-10-CM | POA: Diagnosis not present

## 2021-08-26 DIAGNOSIS — R41841 Cognitive communication deficit: Secondary | ICD-10-CM | POA: Diagnosis not present

## 2021-08-26 DIAGNOSIS — S32000D Wedge compression fracture of unspecified lumbar vertebra, subsequent encounter for fracture with routine healing: Secondary | ICD-10-CM | POA: Diagnosis not present

## 2021-08-27 DIAGNOSIS — R29898 Other symptoms and signs involving the musculoskeletal system: Secondary | ICD-10-CM | POA: Diagnosis not present

## 2021-08-27 DIAGNOSIS — M6281 Muscle weakness (generalized): Secondary | ICD-10-CM | POA: Diagnosis not present

## 2021-08-27 DIAGNOSIS — R296 Repeated falls: Secondary | ICD-10-CM | POA: Diagnosis not present

## 2021-08-27 DIAGNOSIS — R42 Dizziness and giddiness: Secondary | ICD-10-CM | POA: Diagnosis not present

## 2021-08-27 DIAGNOSIS — S32000D Wedge compression fracture of unspecified lumbar vertebra, subsequent encounter for fracture with routine healing: Secondary | ICD-10-CM | POA: Diagnosis not present

## 2021-08-27 DIAGNOSIS — R41841 Cognitive communication deficit: Secondary | ICD-10-CM | POA: Diagnosis not present

## 2021-08-28 DIAGNOSIS — S32000D Wedge compression fracture of unspecified lumbar vertebra, subsequent encounter for fracture with routine healing: Secondary | ICD-10-CM | POA: Diagnosis not present

## 2021-08-28 DIAGNOSIS — R42 Dizziness and giddiness: Secondary | ICD-10-CM | POA: Diagnosis not present

## 2021-08-28 DIAGNOSIS — M6281 Muscle weakness (generalized): Secondary | ICD-10-CM | POA: Diagnosis not present

## 2021-08-28 DIAGNOSIS — R29898 Other symptoms and signs involving the musculoskeletal system: Secondary | ICD-10-CM | POA: Diagnosis not present

## 2021-08-28 DIAGNOSIS — R296 Repeated falls: Secondary | ICD-10-CM | POA: Diagnosis not present

## 2021-08-28 DIAGNOSIS — R41841 Cognitive communication deficit: Secondary | ICD-10-CM | POA: Diagnosis not present

## 2021-08-29 DIAGNOSIS — R296 Repeated falls: Secondary | ICD-10-CM | POA: Diagnosis not present

## 2021-08-29 DIAGNOSIS — R41841 Cognitive communication deficit: Secondary | ICD-10-CM | POA: Diagnosis not present

## 2021-08-29 DIAGNOSIS — R29898 Other symptoms and signs involving the musculoskeletal system: Secondary | ICD-10-CM | POA: Diagnosis not present

## 2021-08-29 DIAGNOSIS — S32000D Wedge compression fracture of unspecified lumbar vertebra, subsequent encounter for fracture with routine healing: Secondary | ICD-10-CM | POA: Diagnosis not present

## 2021-08-29 DIAGNOSIS — R42 Dizziness and giddiness: Secondary | ICD-10-CM | POA: Diagnosis not present

## 2021-08-29 DIAGNOSIS — M6281 Muscle weakness (generalized): Secondary | ICD-10-CM | POA: Diagnosis not present

## 2021-08-30 DIAGNOSIS — M6281 Muscle weakness (generalized): Secondary | ICD-10-CM | POA: Diagnosis not present

## 2021-08-30 DIAGNOSIS — R42 Dizziness and giddiness: Secondary | ICD-10-CM | POA: Diagnosis not present

## 2021-08-30 DIAGNOSIS — R29898 Other symptoms and signs involving the musculoskeletal system: Secondary | ICD-10-CM | POA: Diagnosis not present

## 2021-08-30 DIAGNOSIS — S32000D Wedge compression fracture of unspecified lumbar vertebra, subsequent encounter for fracture with routine healing: Secondary | ICD-10-CM | POA: Diagnosis not present

## 2021-08-30 DIAGNOSIS — R41841 Cognitive communication deficit: Secondary | ICD-10-CM | POA: Diagnosis not present

## 2021-08-30 DIAGNOSIS — R296 Repeated falls: Secondary | ICD-10-CM | POA: Diagnosis not present

## 2021-09-01 DIAGNOSIS — R41841 Cognitive communication deficit: Secondary | ICD-10-CM | POA: Diagnosis not present

## 2021-09-01 DIAGNOSIS — M6281 Muscle weakness (generalized): Secondary | ICD-10-CM | POA: Diagnosis not present

## 2021-09-01 DIAGNOSIS — R296 Repeated falls: Secondary | ICD-10-CM | POA: Diagnosis not present

## 2021-09-01 DIAGNOSIS — R42 Dizziness and giddiness: Secondary | ICD-10-CM | POA: Diagnosis not present

## 2021-09-01 DIAGNOSIS — R29898 Other symptoms and signs involving the musculoskeletal system: Secondary | ICD-10-CM | POA: Diagnosis not present

## 2021-09-01 DIAGNOSIS — S32000D Wedge compression fracture of unspecified lumbar vertebra, subsequent encounter for fracture with routine healing: Secondary | ICD-10-CM | POA: Diagnosis not present

## 2021-09-02 DIAGNOSIS — R42 Dizziness and giddiness: Secondary | ICD-10-CM | POA: Diagnosis not present

## 2021-09-02 DIAGNOSIS — R296 Repeated falls: Secondary | ICD-10-CM | POA: Diagnosis not present

## 2021-09-02 DIAGNOSIS — S32000D Wedge compression fracture of unspecified lumbar vertebra, subsequent encounter for fracture with routine healing: Secondary | ICD-10-CM | POA: Diagnosis not present

## 2021-09-02 DIAGNOSIS — R29898 Other symptoms and signs involving the musculoskeletal system: Secondary | ICD-10-CM | POA: Diagnosis not present

## 2021-09-02 DIAGNOSIS — M6281 Muscle weakness (generalized): Secondary | ICD-10-CM | POA: Diagnosis not present

## 2021-09-02 DIAGNOSIS — R41841 Cognitive communication deficit: Secondary | ICD-10-CM | POA: Diagnosis not present

## 2021-09-03 DIAGNOSIS — S72145D Nondisplaced intertrochanteric fracture of left femur, subsequent encounter for closed fracture with routine healing: Secondary | ICD-10-CM | POA: Diagnosis not present

## 2021-09-03 DIAGNOSIS — R296 Repeated falls: Secondary | ICD-10-CM | POA: Diagnosis not present

## 2021-09-03 DIAGNOSIS — R41841 Cognitive communication deficit: Secondary | ICD-10-CM | POA: Diagnosis not present

## 2021-09-03 DIAGNOSIS — M6281 Muscle weakness (generalized): Secondary | ICD-10-CM | POA: Diagnosis not present

## 2021-09-03 DIAGNOSIS — G40909 Epilepsy, unspecified, not intractable, without status epilepticus: Secondary | ICD-10-CM | POA: Diagnosis not present

## 2021-09-03 DIAGNOSIS — R29898 Other symptoms and signs involving the musculoskeletal system: Secondary | ICD-10-CM | POA: Diagnosis not present

## 2021-09-03 DIAGNOSIS — S72002D Fracture of unspecified part of neck of left femur, subsequent encounter for closed fracture with routine healing: Secondary | ICD-10-CM | POA: Diagnosis not present

## 2021-09-03 DIAGNOSIS — S32000D Wedge compression fracture of unspecified lumbar vertebra, subsequent encounter for fracture with routine healing: Secondary | ICD-10-CM | POA: Diagnosis not present

## 2021-09-03 DIAGNOSIS — R2681 Unsteadiness on feet: Secondary | ICD-10-CM | POA: Diagnosis not present

## 2021-09-04 DIAGNOSIS — S72145D Nondisplaced intertrochanteric fracture of left femur, subsequent encounter for closed fracture with routine healing: Secondary | ICD-10-CM | POA: Diagnosis not present

## 2021-09-04 DIAGNOSIS — R41841 Cognitive communication deficit: Secondary | ICD-10-CM | POA: Diagnosis not present

## 2021-09-04 DIAGNOSIS — S32000D Wedge compression fracture of unspecified lumbar vertebra, subsequent encounter for fracture with routine healing: Secondary | ICD-10-CM | POA: Diagnosis not present

## 2021-09-04 DIAGNOSIS — R29898 Other symptoms and signs involving the musculoskeletal system: Secondary | ICD-10-CM | POA: Diagnosis not present

## 2021-09-04 DIAGNOSIS — G40909 Epilepsy, unspecified, not intractable, without status epilepticus: Secondary | ICD-10-CM | POA: Diagnosis not present

## 2021-09-04 DIAGNOSIS — M6281 Muscle weakness (generalized): Secondary | ICD-10-CM | POA: Diagnosis not present

## 2021-09-05 DIAGNOSIS — R41841 Cognitive communication deficit: Secondary | ICD-10-CM | POA: Diagnosis not present

## 2021-09-05 DIAGNOSIS — S72145D Nondisplaced intertrochanteric fracture of left femur, subsequent encounter for closed fracture with routine healing: Secondary | ICD-10-CM | POA: Diagnosis not present

## 2021-09-05 DIAGNOSIS — M6281 Muscle weakness (generalized): Secondary | ICD-10-CM | POA: Diagnosis not present

## 2021-09-05 DIAGNOSIS — S32000D Wedge compression fracture of unspecified lumbar vertebra, subsequent encounter for fracture with routine healing: Secondary | ICD-10-CM | POA: Diagnosis not present

## 2021-09-05 DIAGNOSIS — R29898 Other symptoms and signs involving the musculoskeletal system: Secondary | ICD-10-CM | POA: Diagnosis not present

## 2021-09-05 DIAGNOSIS — G40909 Epilepsy, unspecified, not intractable, without status epilepticus: Secondary | ICD-10-CM | POA: Diagnosis not present

## 2021-09-06 DIAGNOSIS — G40909 Epilepsy, unspecified, not intractable, without status epilepticus: Secondary | ICD-10-CM | POA: Diagnosis not present

## 2021-09-06 DIAGNOSIS — R29898 Other symptoms and signs involving the musculoskeletal system: Secondary | ICD-10-CM | POA: Diagnosis not present

## 2021-09-06 DIAGNOSIS — R41841 Cognitive communication deficit: Secondary | ICD-10-CM | POA: Diagnosis not present

## 2021-09-06 DIAGNOSIS — S32000D Wedge compression fracture of unspecified lumbar vertebra, subsequent encounter for fracture with routine healing: Secondary | ICD-10-CM | POA: Diagnosis not present

## 2021-09-06 DIAGNOSIS — M6281 Muscle weakness (generalized): Secondary | ICD-10-CM | POA: Diagnosis not present

## 2021-09-06 DIAGNOSIS — S72145D Nondisplaced intertrochanteric fracture of left femur, subsequent encounter for closed fracture with routine healing: Secondary | ICD-10-CM | POA: Diagnosis not present

## 2021-09-09 DIAGNOSIS — R41841 Cognitive communication deficit: Secondary | ICD-10-CM | POA: Diagnosis not present

## 2021-09-09 DIAGNOSIS — M6281 Muscle weakness (generalized): Secondary | ICD-10-CM | POA: Diagnosis not present

## 2021-09-09 DIAGNOSIS — S32000D Wedge compression fracture of unspecified lumbar vertebra, subsequent encounter for fracture with routine healing: Secondary | ICD-10-CM | POA: Diagnosis not present

## 2021-09-09 DIAGNOSIS — S72145D Nondisplaced intertrochanteric fracture of left femur, subsequent encounter for closed fracture with routine healing: Secondary | ICD-10-CM | POA: Diagnosis not present

## 2021-09-09 DIAGNOSIS — G40909 Epilepsy, unspecified, not intractable, without status epilepticus: Secondary | ICD-10-CM | POA: Diagnosis not present

## 2021-09-09 DIAGNOSIS — R29898 Other symptoms and signs involving the musculoskeletal system: Secondary | ICD-10-CM | POA: Diagnosis not present

## 2021-09-10 DIAGNOSIS — S32000D Wedge compression fracture of unspecified lumbar vertebra, subsequent encounter for fracture with routine healing: Secondary | ICD-10-CM | POA: Diagnosis not present

## 2021-09-10 DIAGNOSIS — S72145D Nondisplaced intertrochanteric fracture of left femur, subsequent encounter for closed fracture with routine healing: Secondary | ICD-10-CM | POA: Diagnosis not present

## 2021-09-10 DIAGNOSIS — R41841 Cognitive communication deficit: Secondary | ICD-10-CM | POA: Diagnosis not present

## 2021-09-10 DIAGNOSIS — M6281 Muscle weakness (generalized): Secondary | ICD-10-CM | POA: Diagnosis not present

## 2021-09-10 DIAGNOSIS — R29898 Other symptoms and signs involving the musculoskeletal system: Secondary | ICD-10-CM | POA: Diagnosis not present

## 2021-09-10 DIAGNOSIS — G40909 Epilepsy, unspecified, not intractable, without status epilepticus: Secondary | ICD-10-CM | POA: Diagnosis not present

## 2021-09-11 DIAGNOSIS — R41841 Cognitive communication deficit: Secondary | ICD-10-CM | POA: Diagnosis not present

## 2021-09-11 DIAGNOSIS — S72145D Nondisplaced intertrochanteric fracture of left femur, subsequent encounter for closed fracture with routine healing: Secondary | ICD-10-CM | POA: Diagnosis not present

## 2021-09-11 DIAGNOSIS — S32000D Wedge compression fracture of unspecified lumbar vertebra, subsequent encounter for fracture with routine healing: Secondary | ICD-10-CM | POA: Diagnosis not present

## 2021-09-11 DIAGNOSIS — G40909 Epilepsy, unspecified, not intractable, without status epilepticus: Secondary | ICD-10-CM | POA: Diagnosis not present

## 2021-09-11 DIAGNOSIS — R29898 Other symptoms and signs involving the musculoskeletal system: Secondary | ICD-10-CM | POA: Diagnosis not present

## 2021-09-11 DIAGNOSIS — M6281 Muscle weakness (generalized): Secondary | ICD-10-CM | POA: Diagnosis not present

## 2021-09-12 ENCOUNTER — Encounter: Payer: Medicare Other | Admitting: Nurse Practitioner

## 2021-09-12 ENCOUNTER — Encounter: Payer: Self-pay | Admitting: Nurse Practitioner

## 2021-09-12 DIAGNOSIS — M1990 Unspecified osteoarthritis, unspecified site: Secondary | ICD-10-CM

## 2021-09-12 DIAGNOSIS — M6281 Muscle weakness (generalized): Secondary | ICD-10-CM | POA: Diagnosis not present

## 2021-09-12 DIAGNOSIS — G40822 Epileptic spasms, not intractable, without status epilepticus: Secondary | ICD-10-CM

## 2021-09-12 DIAGNOSIS — F5101 Primary insomnia: Secondary | ICD-10-CM

## 2021-09-12 DIAGNOSIS — E785 Hyperlipidemia, unspecified: Secondary | ICD-10-CM

## 2021-09-12 DIAGNOSIS — R41841 Cognitive communication deficit: Secondary | ICD-10-CM | POA: Diagnosis not present

## 2021-09-12 DIAGNOSIS — M810 Age-related osteoporosis without current pathological fracture: Secondary | ICD-10-CM

## 2021-09-12 DIAGNOSIS — D500 Iron deficiency anemia secondary to blood loss (chronic): Secondary | ICD-10-CM

## 2021-09-12 DIAGNOSIS — S72145D Nondisplaced intertrochanteric fracture of left femur, subsequent encounter for closed fracture with routine healing: Secondary | ICD-10-CM | POA: Diagnosis not present

## 2021-09-12 DIAGNOSIS — R7303 Prediabetes: Secondary | ICD-10-CM

## 2021-09-12 DIAGNOSIS — G40909 Epilepsy, unspecified, not intractable, without status epilepticus: Secondary | ICD-10-CM | POA: Diagnosis not present

## 2021-09-12 DIAGNOSIS — S32000D Wedge compression fracture of unspecified lumbar vertebra, subsequent encounter for fracture with routine healing: Secondary | ICD-10-CM | POA: Diagnosis not present

## 2021-09-12 DIAGNOSIS — K3189 Other diseases of stomach and duodenum: Secondary | ICD-10-CM

## 2021-09-12 DIAGNOSIS — R42 Dizziness and giddiness: Secondary | ICD-10-CM

## 2021-09-12 DIAGNOSIS — R29898 Other symptoms and signs involving the musculoskeletal system: Secondary | ICD-10-CM | POA: Diagnosis not present

## 2021-09-12 DIAGNOSIS — K5904 Chronic idiopathic constipation: Secondary | ICD-10-CM

## 2021-09-12 NOTE — Assessment & Plan Note (Signed)
stable, on MiraLax, Senokot S II bid.  

## 2021-09-12 NOTE — Assessment & Plan Note (Addendum)
takes Reclast, t-score -3.6 09/13/19 

## 2021-09-12 NOTE — Progress Notes (Signed)
This encounter was created in error - please disregard.

## 2021-09-12 NOTE — Assessment & Plan Note (Signed)
on Antivert 12.5mg  bid.

## 2021-09-12 NOTE — Assessment & Plan Note (Signed)
s/p 2 wks of PPI, Diet controls.

## 2021-09-12 NOTE — Assessment & Plan Note (Signed)
Hgb a1c 5.7 04/25/21 

## 2021-09-12 NOTE — Assessment & Plan Note (Signed)
s/p left hip surgery, s/p Kyphplasty 

## 2021-09-12 NOTE — Assessment & Plan Note (Signed)
takes Atorvastatin 5mg qd, LDL 79 08/15/20 

## 2021-09-12 NOTE — Assessment & Plan Note (Signed)
no active seizures since last seen, failed generic Tegretol in the past, on Tegretol 200mg qd, Clonazepam hs (GDR to stop on her own didn't cause active seizures, but flare up insomnia and anxiety). S/p Neurology  

## 2021-09-12 NOTE — Assessment & Plan Note (Signed)
takes Clonazepam, failed GDR,  failed Ambien. TSH 3.2 12/21/20 

## 2021-09-12 NOTE — Assessment & Plan Note (Signed)
Hgb 12.9 07/05/21 

## 2021-09-16 DIAGNOSIS — G40909 Epilepsy, unspecified, not intractable, without status epilepticus: Secondary | ICD-10-CM | POA: Diagnosis not present

## 2021-09-16 DIAGNOSIS — M6281 Muscle weakness (generalized): Secondary | ICD-10-CM | POA: Diagnosis not present

## 2021-09-16 DIAGNOSIS — S32000D Wedge compression fracture of unspecified lumbar vertebra, subsequent encounter for fracture with routine healing: Secondary | ICD-10-CM | POA: Diagnosis not present

## 2021-09-16 DIAGNOSIS — S72145D Nondisplaced intertrochanteric fracture of left femur, subsequent encounter for closed fracture with routine healing: Secondary | ICD-10-CM | POA: Diagnosis not present

## 2021-09-16 DIAGNOSIS — R29898 Other symptoms and signs involving the musculoskeletal system: Secondary | ICD-10-CM | POA: Diagnosis not present

## 2021-09-16 DIAGNOSIS — R41841 Cognitive communication deficit: Secondary | ICD-10-CM | POA: Diagnosis not present

## 2021-09-18 DIAGNOSIS — S32000D Wedge compression fracture of unspecified lumbar vertebra, subsequent encounter for fracture with routine healing: Secondary | ICD-10-CM | POA: Diagnosis not present

## 2021-09-18 DIAGNOSIS — G40909 Epilepsy, unspecified, not intractable, without status epilepticus: Secondary | ICD-10-CM | POA: Diagnosis not present

## 2021-09-18 DIAGNOSIS — R29898 Other symptoms and signs involving the musculoskeletal system: Secondary | ICD-10-CM | POA: Diagnosis not present

## 2021-09-18 DIAGNOSIS — R296 Repeated falls: Secondary | ICD-10-CM | POA: Diagnosis not present

## 2021-09-18 DIAGNOSIS — R2681 Unsteadiness on feet: Secondary | ICD-10-CM | POA: Diagnosis not present

## 2021-09-18 DIAGNOSIS — M6281 Muscle weakness (generalized): Secondary | ICD-10-CM | POA: Diagnosis not present

## 2021-09-18 DIAGNOSIS — R41841 Cognitive communication deficit: Secondary | ICD-10-CM | POA: Diagnosis not present

## 2021-09-18 DIAGNOSIS — S72002D Fracture of unspecified part of neck of left femur, subsequent encounter for closed fracture with routine healing: Secondary | ICD-10-CM | POA: Diagnosis not present

## 2021-09-18 DIAGNOSIS — S72145D Nondisplaced intertrochanteric fracture of left femur, subsequent encounter for closed fracture with routine healing: Secondary | ICD-10-CM | POA: Diagnosis not present

## 2021-09-19 ENCOUNTER — Non-Acute Institutional Stay: Payer: Medicare Other | Admitting: Nurse Practitioner

## 2021-09-19 ENCOUNTER — Encounter: Payer: Self-pay | Admitting: Nurse Practitioner

## 2021-09-19 DIAGNOSIS — M810 Age-related osteoporosis without current pathological fracture: Secondary | ICD-10-CM | POA: Diagnosis not present

## 2021-09-19 DIAGNOSIS — F5101 Primary insomnia: Secondary | ICD-10-CM | POA: Diagnosis not present

## 2021-09-19 DIAGNOSIS — K3189 Other diseases of stomach and duodenum: Secondary | ICD-10-CM | POA: Diagnosis not present

## 2021-09-19 DIAGNOSIS — M1990 Unspecified osteoarthritis, unspecified site: Secondary | ICD-10-CM

## 2021-09-19 DIAGNOSIS — R7303 Prediabetes: Secondary | ICD-10-CM

## 2021-09-19 DIAGNOSIS — R42 Dizziness and giddiness: Secondary | ICD-10-CM

## 2021-09-19 DIAGNOSIS — D500 Iron deficiency anemia secondary to blood loss (chronic): Secondary | ICD-10-CM

## 2021-09-19 DIAGNOSIS — G40822 Epileptic spasms, not intractable, without status epilepticus: Secondary | ICD-10-CM

## 2021-09-19 DIAGNOSIS — G4700 Insomnia, unspecified: Secondary | ICD-10-CM | POA: Diagnosis not present

## 2021-09-19 DIAGNOSIS — E785 Hyperlipidemia, unspecified: Secondary | ICD-10-CM

## 2021-09-19 DIAGNOSIS — R41841 Cognitive communication deficit: Secondary | ICD-10-CM | POA: Diagnosis not present

## 2021-09-19 DIAGNOSIS — S72145D Nondisplaced intertrochanteric fracture of left femur, subsequent encounter for closed fracture with routine healing: Secondary | ICD-10-CM | POA: Diagnosis not present

## 2021-09-19 DIAGNOSIS — K5904 Chronic idiopathic constipation: Secondary | ICD-10-CM

## 2021-09-19 DIAGNOSIS — S32000D Wedge compression fracture of unspecified lumbar vertebra, subsequent encounter for fracture with routine healing: Secondary | ICD-10-CM | POA: Diagnosis not present

## 2021-09-19 DIAGNOSIS — R29898 Other symptoms and signs involving the musculoskeletal system: Secondary | ICD-10-CM | POA: Diagnosis not present

## 2021-09-19 DIAGNOSIS — G40909 Epilepsy, unspecified, not intractable, without status epilepticus: Secondary | ICD-10-CM | POA: Diagnosis not present

## 2021-09-19 DIAGNOSIS — M6281 Muscle weakness (generalized): Secondary | ICD-10-CM | POA: Diagnosis not present

## 2021-09-19 MED ORDER — MECLIZINE HCL 25 MG PO TABS: 12.5000 mg | ORAL_TABLET | Freq: Three times a day (TID) | ORAL | 2 refills | Status: DC | PRN

## 2021-09-19 MED ORDER — CLONAZEPAM 1 MG PO TABS: 1.0000 mg | ORAL_TABLET | Freq: Every day | ORAL | 0 refills | Status: DC

## 2021-09-19 NOTE — Assessment & Plan Note (Signed)
s/p left hip surgery, s/p Kyphplasty 

## 2021-09-19 NOTE — Assessment & Plan Note (Signed)
takes Reclast, t-score -3.6 09/13/19 

## 2021-09-19 NOTE — Assessment & Plan Note (Signed)
stable, on MiraLax, Senokot S II bid.  

## 2021-09-19 NOTE — Assessment & Plan Note (Signed)
takes Clonazepam, failed GDR,  failed Ambien. TSH 3.2 12/21/20 

## 2021-09-19 NOTE — Progress Notes (Signed)
Location:   clinic [facility] Room Number: NO/34/A Place of Service:  SNF (31) Provider: Chipper Oman NP  Code Status: DNR Goals of Care: [place]    09/19/2021    9:22 AM  Advanced Directives  Does Patient Have a Medical Advance Directive? Yes  Type of Advance Directive Healthcare Power of Attorney  Does patient want to make changes to medical advance directive? No - Patient declined  Copy of Healthcare Power of Attorney in Chart? Yes - validated most recent copy scanned in chart (See row information)     Chief Complaint  Patient presents with   Acute Visit    Patient is here for nausea and dizziness     HPI: Patient is a 81 y.o. female seen today for medical management of chronic diseases.    OA, s/p left hip surgery, s/p Kyphplasty  Dizziness: better, on Antivert 12.5mg  bid.    Post op anemia, Hgb 12.9 07/05/21             Seizures, no active seizures since last seen, failed generic Tegretol in the past, on Tegretol 200mg  qd, Clonazepam hs (GDR to stop on her own didn't cause active seizures, but flare up insomnia and anxiety). S/p Neurology              Constipation, stable, on MiraLax, Senokot S II bid.              Hyperlipidemia, takes Atorvastatin 5mg  qd, LDL 79 08/15/20             Prediabetic, Hgb a1c 5.7 04/25/21             OP takes Reclast, t-score -3.6 09/13/19             Insomnia, takes Clonazepam, failed GDR,  failed Ambien. TSH 3.2 12/21/20             Erosive gastropathy, off PPI, stable Past Medical History:  Diagnosis Date   Anxiety    Cataract    Chronic constipation    Depression    Osteoporosis    Seizures ([name])    epilepsy  last seizure 1977    Past Surgical History:  Procedure Laterality Date   ABDOMINAL HYSTERECTOMY  1995   Dr. 1978   CATARACT EXTRACTION Bilateral 12/2016   COLONOSCOPY  2012   patchy increased intraepithelial lymphocytes - draelos   ESOPHAGOGASTRODUODENOSCOPY     multiple   FISSURECTOMY     INTRAMEDULLARY (IM) NAIL  INTERTROCHANTERIC Left 05/09/2021   Procedure: INTRAMEDULLARY (IM) NAIL INTERTROCHANTRIC;  Surgeon: 2013, MD;  Location: [facility] OR;  Service: Orthopedics;  Laterality: Left;   IR KYPHO EA ADDL LEVEL THORACIC OR LUMBAR  06/26/2021   IR KYPHO THORACIC WITH BONE BIOPSY  06/26/2021   WRIST SURGERY Left    due to fracture    Allergies  Allergen Reactions   Cat Hair Extract Other (See Comments)    Patient states that it causes her eyes to swell shut   Cefaclor Other (See Comments)    Unknown reaction   Codeine Nausea Only   Fish Allergy Nausea Only   Iodinated Contrast Media Other (See Comments)    Unknown reaction     Other Other (See Comments)    Seeds -- Reflux   Penicillins Itching   Percocet [Oxycodone-Acetaminophen] Nausea And Vomiting   Red Dye Other (See Comments)    Reflux   Senna-Docusate Sodium [Sennosides-Docusate Sodium] Other (See Comments)    Severe stomach  pains   Shrimp (Diagnostic) Nausea Only    Stomach pain    Tomato Other (See Comments)    Reflux   Hm Lidocaine Patch [Lidocaine] Rash    Allergies as of 09/19/2021       Reactions   Cat Hair Extract Other (See Comments)   Patient states that it causes her eyes to swell shut   Cefaclor Other (See Comments)   Unknown reaction   Codeine Nausea Only   Fish Allergy Nausea Only   Iodinated Contrast Media Other (See Comments)   Unknown reaction   Other Other (See Comments)   Seeds -- Reflux   Penicillins Itching   Percocet [oxycodone-acetaminophen] Nausea And Vomiting   Red Dye Other (See Comments)   Reflux   Senna-docusate Sodium [sennosides-docusate Sodium] Other (See Comments)   Severe stomach pains   Shrimp (diagnostic) Nausea Only   Stomach pain   Tomato Other (See Comments)   Reflux   Hm Lidocaine Patch [lidocaine] Rash        Medication List        Accurate as of September 19, 2021 11:59 PM. If you have any questions, ask your nurse or doctor.          acetaminophen 500 MG  tablet Commonly known as: TYLENOL Take 1,000 mg by mouth every 8 (eight) hours as needed for mild pain.   atorvastatin 10 MG tablet Commonly known as: LIPITOR Take by mouth daily. 1/2 tablet   BOOST GLUCOSE CONTROL PO Take 1 Package by mouth daily.   Caltrate 600+D Plus Minerals 600-800 MG-UNIT Chew Chew 2 each by mouth daily.   carbamazepine 200 MG tablet Commonly known as: TEGRETOL Take 200 mg by mouth daily. 1 & 1/2 tablets   cetirizine 10 MG tablet Commonly known as: ZYRTEC Take 10 mg by mouth daily.   cholestyramine light 4 g packet Commonly known as: PREVALITE Take 4 g by mouth 2 (two) times daily.   clonazePAM 1 MG tablet Commonly known as: KLONOPIN Take 1 tablet (1 mg total) by mouth at bedtime.   HYDROcodone-acetaminophen 5-325 MG tablet Commonly known as: NORCO/VICODIN Take 1 tablet by mouth every 4 (four) hours as needed for moderate pain.   meclizine 12.5 MG tablet Commonly known as: ANTIVERT Take 12.5 mg by mouth 2 (two) times daily.   meclizine 25 MG tablet Commonly known as: ANTIVERT Take 0.5 tablets (12.5 mg total) by mouth 3 (three) times daily as needed for dizziness.   polyethylene glycol powder 17 GM/SCOOP powder Commonly known as: GLYCOLAX/MIRALAX Take 17 g by mouth daily.   PRESERVISION AREDS PO Take 1 tablet by mouth 2 (two) times daily.   senna-docusate 8.6-50 MG tablet Commonly known as: Senokot-S Take 2 tablets by mouth 2 (two) times daily.   Soothe XP Soln Place 1 drop into both eyes in the morning, at noon, in the evening, and at bedtime.   Vitamin D (Ergocalciferol) 1.25 MG (50000 UNIT) Caps capsule Commonly known as: DRISDOL Take 50,000 Units by mouth every 7 (seven) days.        Review of Systems:  Review of Systems  Constitutional:  Negative for appetite change, fatigue and fever.  HENT:  Positive for hearing loss. Negative for congestion and voice change.   Eyes:  Negative for visual disturbance.  Respiratory:   Negative for shortness of breath.   Cardiovascular:  Negative for leg swelling.  Gastrointestinal:  Negative for abdominal pain and constipation.  Genitourinary:  Positive for frequency. Negative for dysuria  and urgency.       The patient stated she pushes her suprapubic region to help emptying her bladder  Musculoskeletal:  Positive for arthralgias, back pain and gait problem.       Left hip pain when walking, better.   Skin:  Negative for color change.       Left hip surgical incision is healed.   Neurological:  Positive for numbness. Negative for seizures and weakness.       Tingling/numbness left foot sometimes. Occasionally left hip/leg pain sometimes. Lightheaded when not sleeping well in lifetime.   Psychiatric/Behavioral:  Positive for sleep disturbance. Negative for behavioral problems. The patient is not nervous/anxious.        Feels lightheaded if not sleep well at night.    Health Maintenance  Topic Date Due   MAMMOGRAM  08/13/2017   URINE MICROALBUMIN  04/19/2021   INFLUENZA VACCINE  09/17/2021   HEMOGLOBIN A1C  10/26/2021   FOOT EXAM  04/11/2022   OPHTHALMOLOGY EXAM  05/07/2022   TETANUS/TDAP  08/21/2027   Pneumonia Vaccine 19+ Years old  Completed   DEXA SCAN  Completed   COVID-19 Vaccine  Completed   Zoster Vaccines- Shingrix  Completed   HPV VACCINES  Aged Out    Physical Exam: Vitals:   09/19/21 1505  BP: (!) 120/52  Pulse: 82  Resp: 18  Temp: (!) 97.3 F (36.3 C)  SpO2: 96%  Weight: 110 lb (49.9 kg)  Height: 5\' 5"  (1.651 m)   Body mass index is 18.3 kg/m. Physical Exam Vitals and nursing note reviewed.  Constitutional:      Appearance: Normal appearance.  HENT:     Head: Normocephalic and atraumatic.     Mouth/Throat:     Mouth: Mucous membranes are moist.  Eyes:     Extraocular Movements: Extraocular movements intact.     Conjunctiva/sclera: Conjunctivae normal.     Pupils: Pupils are equal, round, and reactive to light.  Cardiovascular:      Rate and Rhythm: Normal rate and regular rhythm.     Heart sounds: No murmur heard. Pulmonary:     Effort: Pulmonary effort is normal.     Breath sounds: No rales.  Abdominal:     General: Bowel sounds are normal.     Palpations: Abdomen is soft.     Tenderness: There is no abdominal tenderness.  Musculoskeletal:     Cervical back: Normal range of motion and neck supple.     Right lower leg: No edema.     Left lower leg: No edema.     Comments: S/p left hip ORIF.   Skin:    General: Skin is warm and dry.     Comments: Left hip surgical incision is covered in clean intact dressing, slightly swelling in the area.   Neurological:     General: No focal deficit present.     Mental Status: She is alert and oriented to person, place, and time. Mental status is at baseline.     Gait: Gait abnormal.     Comments: Ambulates with walker.   Psychiatric:        Mood and Affect: Mood normal.        Behavior: Behavior normal.        Thought Content: Thought content normal.        Judgment: Judgment normal.     Labs reviewed: Basic Metabolic Panel: Recent Labs    12/21/20 0000 05/09/21 1406 06/21/21 1145 06/22/21 0312 06/26/21 0056  07/05/21 0000  NA 140   < > 130* 133* 130* 136*  K 4.0   < > 4.2 3.7 4.0 4.3  CL 105   < > 94* 100 97* 97*  CO2 30*   < > 28 28 26  27*  GLUCOSE  --    < > 170* 155* 186*  --   BUN 21   < > 13 9 13 13   CREATININE 0.7   < > 0.60 0.52 0.54 0.5  CALCIUM 9.4   < > 9.4 8.7* 9.2 8.9  TSH 3.20  --   --   --   --   --    < > = values in this interval not displayed.   Liver Function Tests: Recent Labs    12/21/20 0000 05/16/21 0000 06/21/21 1145  AST 17 28 30   ALT 16 30 32  ALKPHOS 38 47 99  BILITOT  --   --  0.5  PROT  --   --  6.6  ALBUMIN 3.8 3.3* 4.3   No results for input(s): "LIPASE", "AMYLASE" in the last 8760 hours. No results for input(s): "AMMONIA" in the last 8760 hours. CBC: Recent Labs    05/12/21 0506 05/16/21 0000 06/21/21 1145  06/26/21 0056 07/05/21 0000  WBC 7.6   < > 10.0 6.1 4.9  NEUTROABS  --    < > 8.3* 3.9 2,685.00  HGB 9.4*   < > 13.0 13.1 12.9  HCT 29.2*   < > 39.7 38.2 39  MCV 99.7  --  97.3 96.5  --   PLT 152   < > 215 194 300   < > = values in this interval not displayed.   Lipid Panel: No results for input(s): "CHOL", "HDL", "LDLCALC", "TRIG", "CHOLHDL", "LDLDIRECT" in the last 8760 hours. Lab Results  Component Value Date   HGBA1C 5.7 (H) 04/25/2021    Procedures since last visit: No results found.  Assessment/Plan Erosive gastropathy  off PPI, stable, had endoscopy  Insomnia  takes Clonazepam, failed GDR,  failed Ambien. TSH 3.2 12/21/20  Osteoporosis takes Reclast, t-score -3.6 09/13/19  Pre-diabetes  Hgb a1c 5.7 04/25/21  Hyperlipidemia  takes Atorvastatin 5mg  qd, LDL 79 08/15/20  Constipation stable, on MiraLax, Senokot S II bid.   Epilepsy without status epilepticus, not intractable ([place]) no active seizures since last seen, failed generic Tegretol in the past, on Tegretol 200mg  qd, Clonazepam hs (GDR to stop on her own didn't cause active seizures, but flare up insomnia and anxiety). S/p Neurology   Blood loss anemia Hgb 12.9 07/05/21  Vertigo better, on Antivert 12.5mg  bid.   Osteoarthritis s/p left hip surgery, s/p Kyphplasty    Labs/tests ordered:  none  Next appt:  3 months

## 2021-09-19 NOTE — Assessment & Plan Note (Signed)
Hgb a1c 5.7 04/25/21 

## 2021-09-19 NOTE — Assessment & Plan Note (Signed)
takes Atorvastatin 5mg qd, LDL 79 08/15/20 

## 2021-09-19 NOTE — Assessment & Plan Note (Signed)
Hgb 12.9 07/05/21 

## 2021-09-19 NOTE — Assessment & Plan Note (Signed)
no active seizures since last seen, failed generic Tegretol in the past, on Tegretol 200mg qd, Clonazepam hs (GDR to stop on her own didn't cause active seizures, but flare up insomnia and anxiety). S/p Neurology  

## 2021-09-19 NOTE — Assessment & Plan Note (Signed)
better, on Antivert 12.5mg bid.  

## 2021-09-19 NOTE — Assessment & Plan Note (Signed)
off PPI, stable, had endoscopy

## 2021-09-20 ENCOUNTER — Encounter: Payer: Self-pay | Admitting: Nurse Practitioner

## 2021-09-23 DIAGNOSIS — R41841 Cognitive communication deficit: Secondary | ICD-10-CM | POA: Diagnosis not present

## 2021-09-23 DIAGNOSIS — M6281 Muscle weakness (generalized): Secondary | ICD-10-CM | POA: Diagnosis not present

## 2021-09-23 DIAGNOSIS — G40909 Epilepsy, unspecified, not intractable, without status epilepticus: Secondary | ICD-10-CM | POA: Diagnosis not present

## 2021-09-23 DIAGNOSIS — S72145D Nondisplaced intertrochanteric fracture of left femur, subsequent encounter for closed fracture with routine healing: Secondary | ICD-10-CM | POA: Diagnosis not present

## 2021-09-23 DIAGNOSIS — S32000D Wedge compression fracture of unspecified lumbar vertebra, subsequent encounter for fracture with routine healing: Secondary | ICD-10-CM | POA: Diagnosis not present

## 2021-09-23 DIAGNOSIS — R29898 Other symptoms and signs involving the musculoskeletal system: Secondary | ICD-10-CM | POA: Diagnosis not present

## 2021-09-25 DIAGNOSIS — G40909 Epilepsy, unspecified, not intractable, without status epilepticus: Secondary | ICD-10-CM | POA: Diagnosis not present

## 2021-09-25 DIAGNOSIS — R41841 Cognitive communication deficit: Secondary | ICD-10-CM | POA: Diagnosis not present

## 2021-09-25 DIAGNOSIS — S32000D Wedge compression fracture of unspecified lumbar vertebra, subsequent encounter for fracture with routine healing: Secondary | ICD-10-CM | POA: Diagnosis not present

## 2021-09-25 DIAGNOSIS — R29898 Other symptoms and signs involving the musculoskeletal system: Secondary | ICD-10-CM | POA: Diagnosis not present

## 2021-09-25 DIAGNOSIS — M6281 Muscle weakness (generalized): Secondary | ICD-10-CM | POA: Diagnosis not present

## 2021-09-25 DIAGNOSIS — S72145D Nondisplaced intertrochanteric fracture of left femur, subsequent encounter for closed fracture with routine healing: Secondary | ICD-10-CM | POA: Diagnosis not present

## 2021-09-26 ENCOUNTER — Encounter: Payer: Self-pay | Admitting: Nurse Practitioner

## 2021-09-26 ENCOUNTER — Non-Acute Institutional Stay: Payer: Medicare Other | Admitting: Nurse Practitioner

## 2021-09-26 DIAGNOSIS — S72145D Nondisplaced intertrochanteric fracture of left femur, subsequent encounter for closed fracture with routine healing: Secondary | ICD-10-CM | POA: Diagnosis not present

## 2021-09-26 DIAGNOSIS — R29898 Other symptoms and signs involving the musculoskeletal system: Secondary | ICD-10-CM | POA: Diagnosis not present

## 2021-09-26 DIAGNOSIS — R7303 Prediabetes: Secondary | ICD-10-CM | POA: Diagnosis not present

## 2021-09-26 DIAGNOSIS — G40822 Epileptic spasms, not intractable, without status epilepticus: Secondary | ICD-10-CM

## 2021-09-26 DIAGNOSIS — K3189 Other diseases of stomach and duodenum: Secondary | ICD-10-CM

## 2021-09-26 DIAGNOSIS — S32000D Wedge compression fracture of unspecified lumbar vertebra, subsequent encounter for fracture with routine healing: Secondary | ICD-10-CM | POA: Diagnosis not present

## 2021-09-26 DIAGNOSIS — E785 Hyperlipidemia, unspecified: Secondary | ICD-10-CM | POA: Diagnosis not present

## 2021-09-26 DIAGNOSIS — R208 Other disturbances of skin sensation: Secondary | ICD-10-CM

## 2021-09-26 DIAGNOSIS — F5101 Primary insomnia: Secondary | ICD-10-CM | POA: Diagnosis not present

## 2021-09-26 DIAGNOSIS — R42 Dizziness and giddiness: Secondary | ICD-10-CM | POA: Diagnosis not present

## 2021-09-26 DIAGNOSIS — M810 Age-related osteoporosis without current pathological fracture: Secondary | ICD-10-CM | POA: Diagnosis not present

## 2021-09-26 DIAGNOSIS — G40909 Epilepsy, unspecified, not intractable, without status epilepticus: Secondary | ICD-10-CM | POA: Diagnosis not present

## 2021-09-26 DIAGNOSIS — R41841 Cognitive communication deficit: Secondary | ICD-10-CM | POA: Diagnosis not present

## 2021-09-26 DIAGNOSIS — M1990 Unspecified osteoarthritis, unspecified site: Secondary | ICD-10-CM

## 2021-09-26 DIAGNOSIS — D500 Iron deficiency anemia secondary to blood loss (chronic): Secondary | ICD-10-CM

## 2021-09-26 DIAGNOSIS — M6281 Muscle weakness (generalized): Secondary | ICD-10-CM | POA: Diagnosis not present

## 2021-09-26 DIAGNOSIS — K5904 Chronic idiopathic constipation: Secondary | ICD-10-CM | POA: Diagnosis not present

## 2021-09-26 MED ORDER — HYDROCORTISONE 1 % EX CREA: 1.0000 | TOPICAL_CREAM | Freq: Two times a day (BID) | CUTANEOUS | 0 refills | Status: DC

## 2021-09-26 NOTE — Assessment & Plan Note (Signed)
Post op anemia, Hgb 12.9 07/05/21

## 2021-09-26 NOTE — Assessment & Plan Note (Signed)
Hgb a1c 5.7 04/25/21 

## 2021-09-26 NOTE — Assessment & Plan Note (Signed)
takes Atorvastatin 5mg qd, LDL 79 08/15/20 

## 2021-09-26 NOTE — Progress Notes (Addendum)
Location:   clinic [facility] Room Number: NO35/A Place of Service:  Clinic (12) Provider: Marlana Latus NP  Code Status: DNR Goals of Care: [place]    09/19/2021    9:22 AM  Advanced Directives  Does Patient Have a Medical Advance Directive? Yes  Type of Advance Directive [place]  Does patient want to make changes to medical advance directive? No - Patient declined  Copy of [place] in Chart? Yes - validated most recent copy scanned in chart (See row information)     Chief Complaint  Patient presents with   Acute Visit    Patient is having stomach pain and generalized lack of energy    HPI: Patient is a 81 y.o. female seen today for abd epigastric area skin burning sensation since last used Biofreeze about 2 weeks ago. She denied heartburns, indigestion, nausea, vomiting, diarrhea, fever, or chest pain.    OA, s/p left hip surgery, s/p Kyphplasty             Dizziness: better, on Antivert 12.5mg  bid.               Post op anemia, Hgb 12.9 07/05/21             Seizures, no active seizures since last seen, failed generic Tegretol in the past, on Tegretol 200mg  qd, Clonazepam hs (GDR to stop on her own didn't cause active seizures, but flare up insomnia and anxiety). S/p Neurology              Constipation, stable, on MiraLax, Senokot S II bid.              Hyperlipidemia, takes Atorvastatin 5mg  qd, LDL 79 08/15/20             Prediabetic, Hgb a1c 5.7 04/25/21             OP takes Reclast, t-score -3.6 09/13/19             Insomnia, takes Clonazepam, failed GDR,  failed Ambien. TSH 3.2 12/21/20             Erosive gastropathy, off PPI   Past Medical History:  Diagnosis Date   Anxiety    Cataract    Chronic constipation    Depression    Osteoporosis    Seizures ([place])    epilepsy  last seizure 1977    Past Surgical History:  Procedure Laterality Date   ABDOMINAL HYSTERECTOMY  1995   Dr. Edwyna Shell   CATARACT EXTRACTION Bilateral  12/2016   COLONOSCOPY  2012   patchy increased intraepithelial lymphocytes - draelos   ESOPHAGOGASTRODUODENOSCOPY     multiple   FISSURECTOMY     INTRAMEDULLARY (IM) NAIL INTERTROCHANTERIC Left 05/09/2021   Procedure: INTRAMEDULLARY (IM) NAIL INTERTROCHANTRIC;  Surgeon: Meredith Pel, MD;  Location: [facility];  Service: Orthopedics;  Laterality: Left;   IR KYPHO EA ADDL LEVEL THORACIC OR LUMBAR  06/26/2021   IR KYPHO THORACIC WITH BONE BIOPSY  06/26/2021   WRIST SURGERY Left    due to fracture    Allergies  Allergen Reactions   Cat Hair Extract Other (See Comments)    Patient states that it causes her eyes to swell shut   Cefaclor Other (See Comments)    Unknown reaction   Codeine Nausea Only   Fish Allergy Nausea Only   Iodinated Contrast Media Other (See Comments)    Unknown reaction     Other  Other (See Comments)    Seeds -- Reflux   Penicillins Itching   Percocet [Oxycodone-Acetaminophen] Nausea And Vomiting   Red Dye Other (See Comments)    Reflux   Senna-Docusate Sodium [Sennosides-Docusate Sodium] Other (See Comments)    Severe stomach pains   Shrimp (Diagnostic) Nausea Only    Stomach pain    Tomato Other (See Comments)    Reflux   Hm Lidocaine Patch [Lidocaine] Rash    Allergies as of 09/26/2021       Reactions   Cat Hair Extract Other (See Comments)   Patient states that it causes her eyes to swell shut   Cefaclor Other (See Comments)   Unknown reaction   Codeine Nausea Only   Fish Allergy Nausea Only   Iodinated Contrast Media Other (See Comments)   Unknown reaction   Other Other (See Comments)   Seeds -- Reflux   Penicillins Itching   Percocet [oxycodone-acetaminophen] Nausea And Vomiting   Red Dye Other (See Comments)   Reflux   Senna-docusate Sodium [sennosides-docusate Sodium] Other (See Comments)   Severe stomach pains   Shrimp (diagnostic) Nausea Only   Stomach pain   Tomato Other (See Comments)   Reflux   Hm Lidocaine Patch [lidocaine]  Rash        Medication List        Accurate as of September 26, 2021 11:59 PM. If you have any questions, ask your nurse or doctor.          acetaminophen 500 MG tablet Commonly known as: TYLENOL Take 1,000 mg by mouth every 8 (eight) hours as needed for mild pain.   atorvastatin 10 MG tablet Commonly known as: LIPITOR Take by mouth daily. 1/2 tablet   BOOST GLUCOSE CONTROL PO Take 1 Package by mouth daily.   Caltrate 600+D Plus Minerals 600-800 MG-UNIT Chew Chew 2 each by mouth daily.   carbamazepine 200 MG tablet Commonly known as: TEGRETOL Take 200 mg by mouth daily. 1 & 1/2 tablets   cetirizine 10 MG tablet Commonly known as: ZYRTEC Take 10 mg by mouth daily.   cholestyramine light 4 g packet Commonly known as: PREVALITE Take 4 g by mouth 2 (two) times daily.   clonazePAM 1 MG tablet Commonly known as: KLONOPIN Take 1 tablet (1 mg total) by mouth at bedtime.   HYDROcodone-acetaminophen 5-325 MG tablet Commonly known as: NORCO/VICODIN Take 1 tablet by mouth every 4 (four) hours as needed for moderate pain.   hydrocortisone cream 1 % Apply 1 Application topically 2 (two) times daily. Apply to epigastric area skin until burning sensation improved. Started by: Kory Rains X Ashana Tullo, NP   meclizine 12.5 MG tablet Commonly known as: ANTIVERT Take 12.5 mg by mouth 2 (two) times daily.   meclizine 25 MG tablet Commonly known as: ANTIVERT Take 0.5 tablets (12.5 mg total) by mouth 3 (three) times daily as needed for dizziness.   polyethylene glycol powder 17 GM/SCOOP powder Commonly known as: GLYCOLAX/MIRALAX Take 17 g by mouth daily.   PRESERVISION AREDS PO Take 1 tablet by mouth 2 (two) times daily.   senna-docusate 8.6-50 MG tablet Commonly known as: Senokot-S Take 2 tablets by mouth 2 (two) times daily.   Soothe XP Soln Place 1 drop into both eyes in the morning, at noon, in the evening, and at bedtime.   Vitamin D (Ergocalciferol) 1.25 MG (50000 UNIT) Caps  capsule Commonly known as: DRISDOL Take 50,000 Units by mouth every 7 (seven) days.  Review of Systems:  Review of Systems  Constitutional:  Negative for appetite change, fatigue and fever.  HENT:  Positive for hearing loss. Negative for congestion and voice change.   Eyes:  Negative for visual disturbance.  Respiratory:  Negative for shortness of breath.   Cardiovascular:  Negative for leg swelling.  Gastrointestinal:  Negative for abdominal pain, constipation, diarrhea, nausea and vomiting.  Genitourinary:  Positive for frequency. Negative for dysuria and urgency.       The patient stated she pushes her suprapubic region to help emptying her bladder  Musculoskeletal:  Positive for arthralgias, back pain and gait problem.       Left hip pain when walking, better.   Skin:  Negative for color change.       Left hip surgical incision is healed. Feels burning sensation about her palm size in the epigastric area, no rash or bruise or irritation noted.   Neurological:  Positive for numbness. Negative for seizures and weakness.       Tingling/numbness left foot sometimes. Occasionally left hip/leg pain sometimes. Lightheaded when not sleeping well in lifetime.   Psychiatric/Behavioral:  Positive for sleep disturbance. Negative for behavioral problems. The patient is not nervous/anxious.        Feels lightheaded if not sleep well at night.    Health Maintenance  Topic Date Due   MAMMOGRAM  08/13/2017   URINE MICROALBUMIN  04/19/2021   COVID-19 Vaccine (5 - Mixed Product risk series) 08/30/2021   INFLUENZA VACCINE  09/17/2021   HEMOGLOBIN A1C  10/26/2021   FOOT EXAM  04/11/2022   OPHTHALMOLOGY EXAM  05/07/2022   TETANUS/TDAP  08/21/2027   Pneumonia Vaccine 63+ Years old  Completed   DEXA SCAN  Completed   Zoster Vaccines- Shingrix  Completed   HPV VACCINES  Aged Out    Physical Exam: Vitals:   09/26/21 1512  BP: (!) 104/50  Pulse: 73  Resp: 18  Temp: (!) 97.1 F (36.2  C)  SpO2: 99%  Weight: 111 lb (50.3 kg)  Height: 5\' 5"  (1.651 m)   Body mass index is 18.47 kg/m. Physical Exam Vitals and nursing note reviewed.  Constitutional:      Appearance: Normal appearance.  HENT:     Head: Normocephalic and atraumatic.     Mouth/Throat:     Mouth: Mucous membranes are moist.  Eyes:     Extraocular Movements: Extraocular movements intact.     Conjunctiva/sclera: Conjunctivae normal.     Pupils: Pupils are equal, round, and reactive to light.  Cardiovascular:     Rate and Rhythm: Normal rate and regular rhythm.     Heart sounds: No murmur heard. Pulmonary:     Effort: Pulmonary effort is normal.     Breath sounds: No rales.  Abdominal:     General: Bowel sounds are normal.     Palpations: Abdomen is soft.     Tenderness: There is no abdominal tenderness.  Musculoskeletal:     Cervical back: Normal range of motion and neck supple.     Right lower leg: No edema.     Left lower leg: No edema.     Comments: S/p left hip ORIF.   Skin:    General: Skin is warm and dry.     Findings: No bruising, erythema or rash.     Comments: Left hip surgical incision is covered in clean intact dressing, slightly swelling in the area.   Neurological:     General: No focal deficit  present.     Mental Status: She is alert and oriented to person, place, and time. Mental status is at baseline.     Gait: Gait abnormal.     Comments: Ambulates with walker.   Psychiatric:        Mood and Affect: Mood normal.        Behavior: Behavior normal.        Thought Content: Thought content normal.        Judgment: Judgment normal.     Labs reviewed: Basic Metabolic Panel: Recent Labs    12/21/20 0000 05/09/21 1406 06/21/21 1145 06/22/21 0312 06/26/21 0056 07/05/21 0000  NA 140   < > 130* 133* 130* 136*  K 4.0   < > 4.2 3.7 4.0 4.3  CL 105   < > 94* 100 97* 97*  CO2 30*   < > 28 28 26  27*  GLUCOSE  --    < > 170* 155* 186*  --   BUN 21   < > 13 9 13 13    CREATININE 0.7   < > 0.60 0.52 0.54 0.5  CALCIUM 9.4   < > 9.4 8.7* 9.2 8.9  TSH 3.20  --   --   --   --   --    < > = values in this interval not displayed.   Liver Function Tests: Recent Labs    12/21/20 0000 05/16/21 0000 06/21/21 1145  AST 17 28 30   ALT 16 30 32  ALKPHOS 38 47 99  BILITOT  --   --  0.5  PROT  --   --  6.6  ALBUMIN 3.8 3.3* 4.3   No results for input(s): "LIPASE", "AMYLASE" in the last 8760 hours. No results for input(s): "AMMONIA" in the last 8760 hours. CBC: Recent Labs    05/12/21 0506 05/16/21 0000 06/21/21 1145 06/26/21 0056 07/05/21 0000  WBC 7.6   < > 10.0 6.1 4.9  NEUTROABS  --    < > 8.3* 3.9 2,685.00  HGB 9.4*   < > 13.0 13.1 12.9  HCT 29.2*   < > 39.7 38.2 39  MCV 99.7  --  97.3 96.5  --   PLT 152   < > 215 194 300   < > = values in this interval not displayed.   Lipid Panel: No results for input(s): "CHOL", "HDL", "LDLCALC", "TRIG", "CHOLHDL", "LDLDIRECT" in the last 8760 hours. Lab Results  Component Value Date   HGBA1C 5.7 (H) 04/25/2021    Procedures since last visit: No results found.  Assessment/Plan  Erosive gastropathy off PPI in the past, asymptomatic.   Osteoarthritis  s/p left hip surgery, s/p Kyphplasty  Vertigo better, on Antivert 12.5mg  bid.   Blood loss anemia  Post op anemia, Hgb 12.9 07/05/21  Epilepsy without status epilepticus, not intractable ([name]) no active seizures since last seen, failed generic Tegretol in the past, on Tegretol 200mg  qd, Clonazepam hs (GDR to stop on her own didn't cause active seizures, but flare up insomnia and anxiety). S/p Neurology   Constipation  stable, on MiraLax, Senokot S II bid.   Hyperlipidemia takes Atorvastatin 5mg  qd, LDL 79 08/15/20  Pre-diabetes Hgb a1c 5.7 04/25/21  Osteoporosis takes Reclast, t-score -3.6 09/13/19  Insomnia takes Clonazepam, failed GDR,  failed Ambien. TSH 3.2 12/21/20  Burning sensation of skin  abd epigastric area skin burning  sensation since last used Biofreeze about 2 weeks ago. She denied heartburns, indigestion, nausea, vomiting, diarrhea, fever, or  chest pain.  Will apply 1% Hydrocortisone cream bid until better.    Labs/tests ordered:  none Next appt:  10/29/2021

## 2021-09-26 NOTE — Assessment & Plan Note (Signed)
better, on Antivert 12.5mg  bid.

## 2021-09-26 NOTE — Assessment & Plan Note (Addendum)
off PPI in the past, asymptomatic.

## 2021-09-26 NOTE — Assessment & Plan Note (Signed)
stable, on MiraLax, Senokot S II bid.  

## 2021-09-26 NOTE — Assessment & Plan Note (Signed)
s/p left hip surgery, s/p Kyphplasty 

## 2021-09-26 NOTE — Assessment & Plan Note (Signed)
no active seizures since last seen, failed generic Tegretol in the past, on Tegretol 200mg qd, Clonazepam hs (GDR to stop on her own didn't cause active seizures, but flare up insomnia and anxiety). S/p Neurology  

## 2021-09-26 NOTE — Assessment & Plan Note (Signed)
takes Reclast, t-score -3.6 09/13/19 

## 2021-09-26 NOTE — Assessment & Plan Note (Signed)
takes Clonazepam, failed GDR,  failed Ambien. TSH 3.2 12/21/20 

## 2021-09-26 NOTE — Assessment & Plan Note (Signed)
abd epigastric area skin burning sensation since last used Biofreeze about 2 weeks ago. She denied heartburns, indigestion, nausea, vomiting, diarrhea, fever, or chest pain.  Will apply 1% Hydrocortisone cream bid until better.

## 2021-09-30 ENCOUNTER — Encounter: Payer: Self-pay | Admitting: Nurse Practitioner

## 2021-09-30 DIAGNOSIS — M6281 Muscle weakness (generalized): Secondary | ICD-10-CM | POA: Diagnosis not present

## 2021-09-30 DIAGNOSIS — S32000D Wedge compression fracture of unspecified lumbar vertebra, subsequent encounter for fracture with routine healing: Secondary | ICD-10-CM | POA: Diagnosis not present

## 2021-09-30 DIAGNOSIS — S72145D Nondisplaced intertrochanteric fracture of left femur, subsequent encounter for closed fracture with routine healing: Secondary | ICD-10-CM | POA: Diagnosis not present

## 2021-09-30 DIAGNOSIS — R41841 Cognitive communication deficit: Secondary | ICD-10-CM | POA: Diagnosis not present

## 2021-09-30 DIAGNOSIS — R29898 Other symptoms and signs involving the musculoskeletal system: Secondary | ICD-10-CM | POA: Diagnosis not present

## 2021-09-30 DIAGNOSIS — G40909 Epilepsy, unspecified, not intractable, without status epilepticus: Secondary | ICD-10-CM | POA: Diagnosis not present

## 2021-10-02 DIAGNOSIS — G40909 Epilepsy, unspecified, not intractable, without status epilepticus: Secondary | ICD-10-CM | POA: Diagnosis not present

## 2021-10-02 DIAGNOSIS — M6281 Muscle weakness (generalized): Secondary | ICD-10-CM | POA: Diagnosis not present

## 2021-10-02 DIAGNOSIS — S32000D Wedge compression fracture of unspecified lumbar vertebra, subsequent encounter for fracture with routine healing: Secondary | ICD-10-CM | POA: Diagnosis not present

## 2021-10-02 DIAGNOSIS — R29898 Other symptoms and signs involving the musculoskeletal system: Secondary | ICD-10-CM | POA: Diagnosis not present

## 2021-10-02 DIAGNOSIS — R41841 Cognitive communication deficit: Secondary | ICD-10-CM | POA: Diagnosis not present

## 2021-10-02 DIAGNOSIS — S72145D Nondisplaced intertrochanteric fracture of left femur, subsequent encounter for closed fracture with routine healing: Secondary | ICD-10-CM | POA: Diagnosis not present

## 2021-10-03 DIAGNOSIS — M6281 Muscle weakness (generalized): Secondary | ICD-10-CM | POA: Diagnosis not present

## 2021-10-03 DIAGNOSIS — G40909 Epilepsy, unspecified, not intractable, without status epilepticus: Secondary | ICD-10-CM | POA: Diagnosis not present

## 2021-10-03 DIAGNOSIS — R41841 Cognitive communication deficit: Secondary | ICD-10-CM | POA: Diagnosis not present

## 2021-10-03 DIAGNOSIS — S32000D Wedge compression fracture of unspecified lumbar vertebra, subsequent encounter for fracture with routine healing: Secondary | ICD-10-CM | POA: Diagnosis not present

## 2021-10-03 DIAGNOSIS — S72145D Nondisplaced intertrochanteric fracture of left femur, subsequent encounter for closed fracture with routine healing: Secondary | ICD-10-CM | POA: Diagnosis not present

## 2021-10-03 DIAGNOSIS — R29898 Other symptoms and signs involving the musculoskeletal system: Secondary | ICD-10-CM | POA: Diagnosis not present

## 2021-10-04 ENCOUNTER — Other Ambulatory Visit: Payer: Self-pay | Admitting: Nurse Practitioner

## 2021-10-04 DIAGNOSIS — G4700 Insomnia, unspecified: Secondary | ICD-10-CM

## 2021-10-04 MED ORDER — CLONAZEPAM 1 MG PO TABS: 1.0000 mg | ORAL_TABLET | Freq: Every day | ORAL | 0 refills | Status: DC

## 2021-10-07 DIAGNOSIS — R41841 Cognitive communication deficit: Secondary | ICD-10-CM | POA: Diagnosis not present

## 2021-10-07 DIAGNOSIS — S72145D Nondisplaced intertrochanteric fracture of left femur, subsequent encounter for closed fracture with routine healing: Secondary | ICD-10-CM | POA: Diagnosis not present

## 2021-10-07 DIAGNOSIS — R29898 Other symptoms and signs involving the musculoskeletal system: Secondary | ICD-10-CM | POA: Diagnosis not present

## 2021-10-07 DIAGNOSIS — S32000D Wedge compression fracture of unspecified lumbar vertebra, subsequent encounter for fracture with routine healing: Secondary | ICD-10-CM | POA: Diagnosis not present

## 2021-10-07 DIAGNOSIS — G40909 Epilepsy, unspecified, not intractable, without status epilepticus: Secondary | ICD-10-CM | POA: Diagnosis not present

## 2021-10-07 DIAGNOSIS — M6281 Muscle weakness (generalized): Secondary | ICD-10-CM | POA: Diagnosis not present

## 2021-10-09 DIAGNOSIS — G40909 Epilepsy, unspecified, not intractable, without status epilepticus: Secondary | ICD-10-CM | POA: Diagnosis not present

## 2021-10-09 DIAGNOSIS — S32000D Wedge compression fracture of unspecified lumbar vertebra, subsequent encounter for fracture with routine healing: Secondary | ICD-10-CM | POA: Diagnosis not present

## 2021-10-09 DIAGNOSIS — R29898 Other symptoms and signs involving the musculoskeletal system: Secondary | ICD-10-CM | POA: Diagnosis not present

## 2021-10-09 DIAGNOSIS — R41841 Cognitive communication deficit: Secondary | ICD-10-CM | POA: Diagnosis not present

## 2021-10-09 DIAGNOSIS — S72145D Nondisplaced intertrochanteric fracture of left femur, subsequent encounter for closed fracture with routine healing: Secondary | ICD-10-CM | POA: Diagnosis not present

## 2021-10-09 DIAGNOSIS — M6281 Muscle weakness (generalized): Secondary | ICD-10-CM | POA: Diagnosis not present

## 2021-10-10 DIAGNOSIS — S72145D Nondisplaced intertrochanteric fracture of left femur, subsequent encounter for closed fracture with routine healing: Secondary | ICD-10-CM | POA: Diagnosis not present

## 2021-10-10 DIAGNOSIS — G40909 Epilepsy, unspecified, not intractable, without status epilepticus: Secondary | ICD-10-CM | POA: Diagnosis not present

## 2021-10-10 DIAGNOSIS — M6281 Muscle weakness (generalized): Secondary | ICD-10-CM | POA: Diagnosis not present

## 2021-10-10 DIAGNOSIS — R41841 Cognitive communication deficit: Secondary | ICD-10-CM | POA: Diagnosis not present

## 2021-10-10 DIAGNOSIS — S32000D Wedge compression fracture of unspecified lumbar vertebra, subsequent encounter for fracture with routine healing: Secondary | ICD-10-CM | POA: Diagnosis not present

## 2021-10-10 DIAGNOSIS — R29898 Other symptoms and signs involving the musculoskeletal system: Secondary | ICD-10-CM | POA: Diagnosis not present

## 2021-10-14 DIAGNOSIS — S72145D Nondisplaced intertrochanteric fracture of left femur, subsequent encounter for closed fracture with routine healing: Secondary | ICD-10-CM | POA: Diagnosis not present

## 2021-10-14 DIAGNOSIS — R41841 Cognitive communication deficit: Secondary | ICD-10-CM | POA: Diagnosis not present

## 2021-10-14 DIAGNOSIS — S32000D Wedge compression fracture of unspecified lumbar vertebra, subsequent encounter for fracture with routine healing: Secondary | ICD-10-CM | POA: Diagnosis not present

## 2021-10-14 DIAGNOSIS — G40909 Epilepsy, unspecified, not intractable, without status epilepticus: Secondary | ICD-10-CM | POA: Diagnosis not present

## 2021-10-14 DIAGNOSIS — R29898 Other symptoms and signs involving the musculoskeletal system: Secondary | ICD-10-CM | POA: Diagnosis not present

## 2021-10-14 DIAGNOSIS — M6281 Muscle weakness (generalized): Secondary | ICD-10-CM | POA: Diagnosis not present

## 2021-10-16 DIAGNOSIS — R29898 Other symptoms and signs involving the musculoskeletal system: Secondary | ICD-10-CM | POA: Diagnosis not present

## 2021-10-16 DIAGNOSIS — S72145D Nondisplaced intertrochanteric fracture of left femur, subsequent encounter for closed fracture with routine healing: Secondary | ICD-10-CM | POA: Diagnosis not present

## 2021-10-16 DIAGNOSIS — G40909 Epilepsy, unspecified, not intractable, without status epilepticus: Secondary | ICD-10-CM | POA: Diagnosis not present

## 2021-10-16 DIAGNOSIS — M6281 Muscle weakness (generalized): Secondary | ICD-10-CM | POA: Diagnosis not present

## 2021-10-16 DIAGNOSIS — S32000D Wedge compression fracture of unspecified lumbar vertebra, subsequent encounter for fracture with routine healing: Secondary | ICD-10-CM | POA: Diagnosis not present

## 2021-10-16 DIAGNOSIS — R41841 Cognitive communication deficit: Secondary | ICD-10-CM | POA: Diagnosis not present

## 2021-10-17 ENCOUNTER — Other Ambulatory Visit: Payer: Self-pay | Admitting: *Deleted

## 2021-10-17 DIAGNOSIS — R29898 Other symptoms and signs involving the musculoskeletal system: Secondary | ICD-10-CM | POA: Diagnosis not present

## 2021-10-17 DIAGNOSIS — M6281 Muscle weakness (generalized): Secondary | ICD-10-CM | POA: Diagnosis not present

## 2021-10-17 DIAGNOSIS — G40909 Epilepsy, unspecified, not intractable, without status epilepticus: Secondary | ICD-10-CM | POA: Diagnosis not present

## 2021-10-17 DIAGNOSIS — G4700 Insomnia, unspecified: Secondary | ICD-10-CM

## 2021-10-17 DIAGNOSIS — S32000D Wedge compression fracture of unspecified lumbar vertebra, subsequent encounter for fracture with routine healing: Secondary | ICD-10-CM | POA: Diagnosis not present

## 2021-10-17 DIAGNOSIS — R41841 Cognitive communication deficit: Secondary | ICD-10-CM | POA: Diagnosis not present

## 2021-10-17 DIAGNOSIS — S72145D Nondisplaced intertrochanteric fracture of left femur, subsequent encounter for closed fracture with routine healing: Secondary | ICD-10-CM | POA: Diagnosis not present

## 2021-10-17 MED ORDER — CLONAZEPAM 1 MG PO TABS: 1.0000 mg | ORAL_TABLET | Freq: Every day | ORAL | 0 refills | Status: DC

## 2021-10-17 NOTE — Telephone Encounter (Signed)
Received refill Request from [facility].  Last Rx refilled was 10/04/2021 but was set to "Print" not sure that the pharmacy received.   Pended Rx and sent to [facility] for approval.

## 2021-10-21 DIAGNOSIS — S32000D Wedge compression fracture of unspecified lumbar vertebra, subsequent encounter for fracture with routine healing: Secondary | ICD-10-CM | POA: Diagnosis not present

## 2021-10-21 DIAGNOSIS — M6281 Muscle weakness (generalized): Secondary | ICD-10-CM | POA: Diagnosis not present

## 2021-10-21 DIAGNOSIS — G40909 Epilepsy, unspecified, not intractable, without status epilepticus: Secondary | ICD-10-CM | POA: Diagnosis not present

## 2021-10-21 DIAGNOSIS — R2681 Unsteadiness on feet: Secondary | ICD-10-CM | POA: Diagnosis not present

## 2021-10-21 DIAGNOSIS — R296 Repeated falls: Secondary | ICD-10-CM | POA: Diagnosis not present

## 2021-10-21 DIAGNOSIS — R29898 Other symptoms and signs involving the musculoskeletal system: Secondary | ICD-10-CM | POA: Diagnosis not present

## 2021-10-21 DIAGNOSIS — S72145D Nondisplaced intertrochanteric fracture of left femur, subsequent encounter for closed fracture with routine healing: Secondary | ICD-10-CM | POA: Diagnosis not present

## 2021-10-21 DIAGNOSIS — S72002D Fracture of unspecified part of neck of left femur, subsequent encounter for closed fracture with routine healing: Secondary | ICD-10-CM | POA: Diagnosis not present

## 2021-10-23 DIAGNOSIS — M6281 Muscle weakness (generalized): Secondary | ICD-10-CM | POA: Diagnosis not present

## 2021-10-23 DIAGNOSIS — R296 Repeated falls: Secondary | ICD-10-CM | POA: Diagnosis not present

## 2021-10-23 DIAGNOSIS — G40909 Epilepsy, unspecified, not intractable, without status epilepticus: Secondary | ICD-10-CM | POA: Diagnosis not present

## 2021-10-23 DIAGNOSIS — S72145D Nondisplaced intertrochanteric fracture of left femur, subsequent encounter for closed fracture with routine healing: Secondary | ICD-10-CM | POA: Diagnosis not present

## 2021-10-23 DIAGNOSIS — S32000D Wedge compression fracture of unspecified lumbar vertebra, subsequent encounter for fracture with routine healing: Secondary | ICD-10-CM | POA: Diagnosis not present

## 2021-10-23 DIAGNOSIS — R29898 Other symptoms and signs involving the musculoskeletal system: Secondary | ICD-10-CM | POA: Diagnosis not present

## 2021-10-24 DIAGNOSIS — M6281 Muscle weakness (generalized): Secondary | ICD-10-CM | POA: Diagnosis not present

## 2021-10-24 DIAGNOSIS — R29898 Other symptoms and signs involving the musculoskeletal system: Secondary | ICD-10-CM | POA: Diagnosis not present

## 2021-10-24 DIAGNOSIS — S72145D Nondisplaced intertrochanteric fracture of left femur, subsequent encounter for closed fracture with routine healing: Secondary | ICD-10-CM | POA: Diagnosis not present

## 2021-10-24 DIAGNOSIS — R296 Repeated falls: Secondary | ICD-10-CM | POA: Diagnosis not present

## 2021-10-24 DIAGNOSIS — G40909 Epilepsy, unspecified, not intractable, without status epilepticus: Secondary | ICD-10-CM | POA: Diagnosis not present

## 2021-10-24 DIAGNOSIS — S32000D Wedge compression fracture of unspecified lumbar vertebra, subsequent encounter for fracture with routine healing: Secondary | ICD-10-CM | POA: Diagnosis not present

## 2021-10-28 DIAGNOSIS — S32000D Wedge compression fracture of unspecified lumbar vertebra, subsequent encounter for fracture with routine healing: Secondary | ICD-10-CM | POA: Diagnosis not present

## 2021-10-28 DIAGNOSIS — R29898 Other symptoms and signs involving the musculoskeletal system: Secondary | ICD-10-CM | POA: Diagnosis not present

## 2021-10-28 DIAGNOSIS — S72145D Nondisplaced intertrochanteric fracture of left femur, subsequent encounter for closed fracture with routine healing: Secondary | ICD-10-CM | POA: Diagnosis not present

## 2021-10-28 DIAGNOSIS — G40909 Epilepsy, unspecified, not intractable, without status epilepticus: Secondary | ICD-10-CM | POA: Diagnosis not present

## 2021-10-28 DIAGNOSIS — M6281 Muscle weakness (generalized): Secondary | ICD-10-CM | POA: Diagnosis not present

## 2021-10-28 DIAGNOSIS — R296 Repeated falls: Secondary | ICD-10-CM | POA: Diagnosis not present

## 2021-10-29 ENCOUNTER — Telehealth: Payer: Self-pay

## 2021-10-29 ENCOUNTER — Encounter: Payer: Self-pay | Admitting: Nurse Practitioner

## 2021-10-29 ENCOUNTER — Ambulatory Visit (INDEPENDENT_AMBULATORY_CARE_PROVIDER_SITE_OTHER): Payer: Medicare Other | Admitting: Nurse Practitioner

## 2021-10-29 DIAGNOSIS — Z0000 Encounter for general adult medical examination without abnormal findings: Secondary | ICD-10-CM

## 2021-10-29 DIAGNOSIS — E2839 Other primary ovarian failure: Secondary | ICD-10-CM | POA: Diagnosis not present

## 2021-10-29 NOTE — Progress Notes (Signed)
Subjective:   Erin Ochoa is a 81 y.o. female who presents for Medicare Annual (Subsequent) preventive examination.  Review of Systems     Cardiac Risk Factors include: advanced age (>19men, >67 women);sedentary lifestyle     Objective:    There were no vitals filed for this visit. There is no height or weight on file to calculate BMI.     10/29/2021   11:16 AM 09/19/2021    9:22 AM 09/12/2021   11:11 AM 07/30/2021    2:10 PM 07/11/2021    4:27 PM 07/02/2021    1:36 PM 07/01/2021    2:17 PM  Advanced Directives  Does Patient Have a Medical Advance Directive? Yes Yes Yes Yes Yes Yes Yes  Type of Social research officer, government Power of [employer] Power of [employer] Power of [employer] Power of Attorney Healthcare Power of Attorney  Does patient want to make changes to medical advance directive? No - Patient declined No - Patient declined No - Patient declined No - Patient declined No - Patient declined No - Patient declined No - Patient declined  Copy of Healthcare Power of Attorney in Chart? Yes - validated most recent copy scanned in chart (See row information) Yes - validated most recent copy scanned in chart (See row information) Yes - validated most recent copy scanned in chart (See row information) Yes - validated most recent copy scanned in chart (See row information) Yes - validated most recent copy scanned in chart (See row information) Yes - validated most recent copy scanned in chart (See row information) Yes - validated most recent copy scanned in chart (See row information)    Current Medications (verified) Outpatient Encounter Medications as of 10/29/2021  Medication Sig   acetaminophen (TYLENOL) 500 MG tablet Take 1,000 mg by mouth 2 (two) times a week.   Artificial Tear Solution (SOOTHE XP) SOLN Place 1 drop into both eyes 4 (four) times daily.   atorvastatin (LIPITOR) 10 MG tablet Take by  mouth daily. 1/2 tablet   Calcium Carbonate-Vit D-Min (CALTRATE 600+D PLUS MINERALS) 600-800 MG-UNIT CHEW Chew 2 each by mouth daily.   carbamazepine (TEGRETOL) 200 MG tablet Take 200 mg by mouth daily. 1 & 1/2 tablets   clonazePAM (KLONOPIN) 1 MG tablet Take 1 tablet (1 mg total) by mouth at bedtime.   meclizine (ANTIVERT) 12.5 MG tablet Take 12.5 mg by mouth 2 (two) times a week.   meclizine (ANTIVERT) 25 MG tablet Take 0.5 tablets (12.5 mg total) by mouth 3 (three) times daily as needed for dizziness.   Menthol, Topical Analgesic, (BIOFREEZE EX) Apply topically.   Multiple Vitamins-Minerals (PRESERVISION AREDS PO) Take 1 tablet by mouth 2 (two) times daily.   Nutritional Supplements (ENSURE MAX PROTEIN PO) Take 1 Bottle by mouth daily.   polyethylene glycol powder (GLYCOLAX/MIRALAX) 17 GM/SCOOP powder Take 17 g by mouth daily.   Vitamin D, Ergocalciferol, (DRISDOL) 1.25 MG (50000 UNIT) CAPS capsule Take 50,000 Units by mouth every 7 (seven) days.   [DISCONTINUED] Nutritional Supplements (BOOST GLUCOSE CONTROL PO) Take 1 Package by mouth daily.   cetirizine (ZYRTEC) 10 MG tablet Take 10 mg by mouth daily.   hydrocortisone cream 1 % Apply 1 Application topically 2 (two) times daily. Apply to epigastric area skin until burning sensation improved.   senna-docusate (SENOKOT-S) 8.6-50 MG tablet Take 2 tablets by mouth 2 (two) times daily. (Patient not taking: Reported on 10/29/2021)   [DISCONTINUED] cholestyramine light (  PREVALITE) 4 g packet Take 4 g by mouth 2 (two) times daily.   [DISCONTINUED] HYDROcodone-acetaminophen (NORCO/VICODIN) 5-325 MG tablet Take 1 tablet by mouth every 4 (four) hours as needed for moderate pain.   No facility-administered encounter medications on file as of 10/29/2021.    Allergies (verified) Cat hair extract, Cefaclor, Codeine, Fish allergy, Iodinated contrast media, Other, Penicillins, Percocet [oxycodone-acetaminophen], Red dye, Senna-docusate sodium  [sennosides-docusate sodium], Shrimp (diagnostic), Tomato, and Hm lidocaine patch [lidocaine]   History: Past Medical History:  Diagnosis Date   Anxiety    Cataract    Chronic constipation    Depression    Osteoporosis    Seizures ([name])    epilepsy  last seizure 1977   Past Surgical History:  Procedure Laterality Date   ABDOMINAL HYSTERECTOMY  1995   Dr. Carlis Abbott   CATARACT EXTRACTION Bilateral 12/2016   COLONOSCOPY  2012   patchy increased intraepithelial lymphocytes - draelos   ESOPHAGOGASTRODUODENOSCOPY     multiple   FISSURECTOMY     INTRAMEDULLARY (IM) NAIL INTERTROCHANTERIC Left 05/09/2021   Procedure: INTRAMEDULLARY (IM) NAIL INTERTROCHANTRIC;  Surgeon: Cammy Copa, MD;  Location: [facility] OR;  Service: Orthopedics;  Laterality: Left;   IR KYPHO EA ADDL LEVEL THORACIC OR LUMBAR  06/26/2021   IR KYPHO THORACIC WITH BONE BIOPSY  06/26/2021   WRIST SURGERY Left    due to fracture   Family History  Problem Relation Age of Onset   Alcohol abuse Father    Diabetes Father    Diabetes Maternal Grandmother    Diabetes Paternal Grandmother    Colon cancer Maternal Grandfather    Esophageal cancer Neg Hx    Rectal cancer Neg Hx    Stomach cancer Neg Hx    Social History   Socioeconomic History   Marital status: Widowed    Spouse name: Not on file   Number of children: 0   Years of education: Not on file   Highest education level: Some college, no degree  Occupational History   Occupation: Retired  Tobacco Use   Smoking status: Former    Packs/day: 1.00    Types: Cigarettes    Start date: 02/18/1956    Quit date: 02/17/1981    Years since quitting: 40.7   Smokeless tobacco: Never  Vaping Use   Vaping Use: Never used  Substance and Sexual Activity   Alcohol use: No    Alcohol/week: 0.0 standard drinks of alcohol   Drug use: No   Sexual activity: Not on file  Other Topics Concern   Not on file  Social History Narrative   Social History     Marital status:  Widowed           Spouse name:                        Years of education:  1 year college              Number of children:0           Widowed no children      Occupational History: Editor, commissioning, Adm. Therapist, music care, [employer].)     None on file      Social History Main Topics     Smoking status: Former Smoker  Packs/day: 1.00      Years: 0.00            Types: Cigarettes        Smokeless tobacco: Not on file                        Alcohol use: No               Drug use: No               Sexual activity: Not on file            Does not drink caffeine, but does eat chocolate.   Does live in an apartment ([address]) retirement community does exercise : walks daily   Right handed         Social Determinants of Health   Financial Resource Strain: Low Risk  (03/13/2017)   Overall Financial Resource Strain (CARDIA)    Difficulty of Paying Living Expenses: Not hard at all  Food Insecurity: No Food Insecurity (03/13/2017)   Hunger Vital Sign    Worried About Running Out of Food in the Last Year: Never true    Ran Out of Food in the Last Year: Never true  Transportation Needs: No Transportation Needs (03/13/2017)   PRAPARE - Administrator, Civil Service (Medical): No    Lack of Transportation (Non-Medical): No  Physical Activity: Sufficiently Active (03/13/2017)   Exercise Vital Sign    Days of Exercise per Week: 5 days    Minutes of Exercise per Session: 30 min  Stress: No Stress Concern Present (03/13/2017)   [employer] of Occupational Health - Occupational Stress Questionnaire    Feeling of Stress : Only a little  Social Connections: Somewhat Isolated (03/13/2017)   Social Connection and Isolation Panel [NHANES]    Frequency of Communication with Friends and Family: More than three times a week    Frequency of Social Gatherings with Friends and Family: More than three times a week    Attends  Religious Services: More than 4 times per year    Active Member of [employer] or Organizations: No    Attends Banker Meetings: Never    Marital Status: Widowed    Tobacco Counseling Counseling given: Not Answered   Clinical Intake:  Pre-visit preparation completed: Yes  Pain : No/denies pain     BMI - recorded: 18 Nutritional Status: BMI <19  Underweight Nutritional Risks: None Diabetes: Yes  How often do you need to have someone help you when you read instructions, pamphlets, or other written materials from your doctor or pharmacy?: 1 - Never  Diabetic?no         Activities of Daily Living    10/29/2021   11:24 AM 05/11/2021   11:00 AM  In your present state of health, do you have any difficulty performing the following activities:  Hearing? 0 0  Vision? 0 0  Difficulty concentrating or making decisions? 0 1  Walking or climbing stairs? 0 0  Dressing or bathing? 0 0  Doing errands, shopping? 0 0  Preparing Food and eating ? N   Using the Toilet? N   In the past six months, have you accidently leaked urine? N   Do you have problems with loss of bowel control? N   Managing your Medications? N   Managing your Finances? N   Housekeeping or managing your Housekeeping? N     Patient Care Team: Mast,  Man X, NP as PCP - General (Internal Medicine) Mast, Man X, NP as Nurse Practitioner (Internal Medicine)  Indicate any recent Medical Services you may have received from other than [facility] providers in the past year (date may be approximate).     Assessment:   This is a routine wellness examination for Erin Ochoa.  Hearing/Vision screen Hearing Screening - Comments:: Patient has no  Vision Screening - Comments:: Patient wears glasses. Patient has upcoming routine eye exam. Patient goes to [facility]  Dietary issues and exercise activities discussed: Current Exercise Habits: Home exercise routine, Type of exercise: walking, Time (Minutes): 20, Frequency  (Times/Week): 5, Weekly Exercise (Minutes/Week): 100   Goals Addressed   None    Depression Screen    10/29/2021   11:13 AM 10/18/2020    1:13 PM 07/29/2018    2:08 PM 03/13/2017   11:11 AM 12/11/2016    3:11 PM 09/26/2014    1:56 PM 08/29/2014   10:52 AM  PHQ 2/9 Scores  PHQ - 2 Score 0 0 0 0 0 0 1    Fall Risk    10/29/2021   11:15 AM 04/11/2021    1:58 PM 03/07/2021    1:28 PM 12/13/2020    2:58 PM 11/20/2020    1:44 PM  Fall Risk   Falls in the past year? 1 0 1 0 0  Number falls in past yr: 0 0 0 0 0  Injury with Fall? 1 0 0 0 0  Risk for fall due to : History of fall(s) No Fall Risks No Fall Risks  No Fall Risks  Follow up Falls evaluation completed Falls evaluation completed;Education provided;Falls prevention discussed Falls evaluation completed Falls evaluation completed Falls evaluation completed    FALL RISK PREVENTION PERTAINING TO THE HOME:  Any stairs in or around the home? No  If so, are there any without handrails? No  Home free of loose throw rugs in walkways, pet beds, electrical cords, etc? Yes  Adequate lighting in your home to reduce risk of falls? Yes   ASSISTIVE DEVICES UTILIZED TO PREVENT FALLS:  Life alert? No  Use of a cane, walker or w/c? Yes  Grab bars in the bathroom? Yes  Shower chair or bench in shower? Yes  Elevated toilet seat or a handicapped toilet? Yes   TIMED UP AND GO:  Was the test performed? No .    Cognitive Function:        10/29/2021   11:16 AM 10/18/2020    1:16 PM  6CIT Screen  What Year? 0 points 0 points  What month? 0 points 0 points  What time? 0 points 0 points  Count back from 20 0 points 2 points  Months in reverse 0 points 0 points  Repeat phrase 0 points 0 points  Total Score 0 points 2 points    Immunizations Immunization History  Administered Date(s) Administered   Fluad Quad(high Dose 65+) 12/03/2020   Influenza Whole 11/19/2017   Influenza, High Dose Seasonal PF 11/26/2016, 12/01/2018, 11/30/2019    Influenza-Unspecified 11/18/2014, 11/29/2015   Moderna Covid-19 Vaccine Bivalent Booster 4460yrs & up 07/05/2021   Moderna Sars-Covid-2 Vaccination 02/21/2019, 03/21/2019, 12/27/2019, 06/26/2020   Pfizer Covid-19 Vaccine Bivalent Booster 6056yrs & up 11/06/2020   Pneumococcal Conjugate-13 09/24/2017   Pneumococcal Polysaccharide-23 12/30/2012, 09/18/2018   Tdap 08/20/2017   Zoster Recombinat (Shingrix) 07/21/2017, 12/08/2017   Zoster, Live 05/28/2007    TDAP status: Up to date  Flu Vaccine status: Due, Education has been provided  regarding the importance of this vaccine. Advised may receive this vaccine at local pharmacy or Health Dept. Aware to provide a copy of the vaccination record if obtained from local pharmacy or Health Dept. Verbalized acceptance and understanding.  Pneumococcal vaccine status: Up to date  Covid-19 vaccine status: Information provided on how to obtain vaccines.   Qualifies for Shingles Vaccine? Yes   Zostavax completed No   Shingrix Completed?: Yes  Screening Tests Health Maintenance  Topic Date Due   MAMMOGRAM  08/13/2017   URINE MICROALBUMIN  04/19/2021   COVID-19 Vaccine (7 - Moderna risk series) 08/30/2021   INFLUENZA VACCINE  09/17/2021   HEMOGLOBIN A1C  10/26/2021   FOOT EXAM  04/11/2022   OPHTHALMOLOGY EXAM  05/07/2022   TETANUS/TDAP  08/21/2027   Pneumonia Vaccine 46+ Years old  Completed   DEXA SCAN  Completed   Zoster Vaccines- Shingrix  Completed   HPV VACCINES  Aged Out    Health Maintenance  Health Maintenance Due  Topic Date Due   MAMMOGRAM  08/13/2017   URINE MICROALBUMIN  04/19/2021   COVID-19 Vaccine (7 - Moderna risk series) 08/30/2021   INFLUENZA VACCINE  09/17/2021   HEMOGLOBIN A1C  10/26/2021    Colorectal cancer screening: No longer required.   Mammogram status: No longer required due to age.  Bone Density status: Ordered today. Pt provided with contact info and advised to call to schedule appt.  Lung Cancer  Screening: (Low Dose CT Chest recommended if Age 76-80 years, 30 pack-year currently smoking OR have quit w/in 15years.) does not qualify.   Lung Cancer Screening Referral: na  Additional Screening:  Hepatitis C Screening: does not qualify; Completed   Vision Screening: Recommended annual ophthalmology exams for early detection of glaucoma and other disorders of the eye. Is the patient up to date with their annual eye exam?  Yes  Who is the provider or what is the name of the office in which the patient attends annual eye exams? [facility] If pt is not established with a provider, would they like to be referred to a provider to establish care? No .   Dental Screening: Recommended annual dental exams for proper oral hygiene  Community Resource Referral / Chronic Care Management: CRR required this visit?  No   CCM required this visit?  No      Plan:     I have personally reviewed and noted the following in the patient's chart:   Medical and social history Use of alcohol, tobacco or illicit drugs  Current medications and supplements including opioid prescriptions. Patient is currently taking opioid prescriptions. Information provided to patient regarding non-opioid alternatives. Patient advised to discuss non-opioid treatment plan with their provider. Functional ability and status Nutritional status Physical activity Advanced directives List of other physicians Hospitalizations, surgeries, and ER visits in previous 12 months Vitals Screenings to include cognitive, depression, and falls Referrals and appointments  In addition, I have reviewed and discussed with patient certain preventive protocols, quality metrics, and best practice recommendations. A written personalized care plan for preventive services as well as general preventive health recommendations were provided to patient.     Sharon Seller, NP   10/29/2021    Virtual Visit via Telephone Note  I connected with  patient 10/29/21 at 11:00 AM EDT by telephone and verified that I am speaking with the correct person using two identifiers.  Location: Patient: home Provider: [facility]    I discussed the limitations, risks, security and privacy concerns  of performing an evaluation and management service by telephone and the availability of in person appointments. I also discussed with the patient that there may be a patient responsible charge related to this service. The patient expressed understanding and agreed to proceed.   I discussed the assessment and treatment plan with the patient. The patient was provided an opportunity to ask questions and all were answered. The patient agreed with the plan and demonstrated an understanding of the instructions.   The patient was advised to call back or seek an in-person evaluation if the symptoms worsen or if the condition fails to improve as anticipated.  I provided 15 minutes of non-face-to-face time during this encounter.  Janene Harvey. [employer] [employer] printed and mailed

## 2021-10-29 NOTE — Telephone Encounter (Signed)
Ms. jamacia, jester are scheduled for a virtual visit with your provider today.    Just as we do with appointments in the office, we must obtain your consent to participate.  Your consent will be active for this visit and any virtual visit you may have with one of our providers in the next 365 days.    If you have a MyChart account, I can also send a copy of this consent to you electronically.  All virtual visits are billed to your insurance company just like a traditional visit in the office.  As this is a virtual visit, video technology does not allow for your provider to perform a traditional examination.  This may limit your provider's ability to fully assess your condition.  If your provider identifies any concerns that need to be evaluated in person or the need to arrange testing such as labs, EKG, etc, we will make arrangements to do so.    Although advances in technology are sophisticated, we cannot ensure that it will always work on either your end or our end.  If the connection with a video visit is poor, we may have to switch to a telephone visit.  With either a video or telephone visit, we are not always able to ensure that we have a secure connection.   I need to obtain your verbal consent now.   Are you willing to proceed with your visit today?   Erin Ochoa has provided verbal consent on 10/29/2021 for a virtual visit (video or telephone).   [name], CMA 10/29/2021  11:22 AM

## 2021-10-29 NOTE — Patient Instructions (Signed)
Erin Ochoa , Thank you for taking time to come for your Medicare Wellness Visit. I appreciate your ongoing commitment to your health goals. Please review the following plan we discussed and let me know if I can assist you in the future.   Screening recommendations/referrals: Colonoscopy aged out  Mammogram aged out Bone Density -due- call [phone] to schedule  Recommended yearly ophthalmology/optometry visit for glaucoma screening and checkup Recommended yearly dental visit for hygiene and checkup  Vaccinations: Influenza vaccine- due annually in September/October Pneumococcal vaccine up to date Tdap vaccine  up to date Shingles vaccine up to date     Advanced directives: on file  Conditions/risks identified: falls  Next appointment: yearly- next year in person.    Preventive Care 81 Years and Older, Female Preventive care refers to lifestyle choices and visits with your health care provider that can promote health and wellness. What does preventive care include? A yearly physical exam. This is also called an annual well check. Dental exams once or twice a year. Routine eye exams. Ask your health care provider how often you should have your eyes checked. Personal lifestyle choices, including: Daily care of your teeth and gums. Regular physical activity. Eating a healthy diet. Avoiding tobacco and drug use. Limiting alcohol use. Practicing safe sex. Taking low-dose aspirin every day. Taking vitamin and mineral supplements as recommended by your health care provider. What happens during an annual well check? The services and screenings done by your health care provider during your annual well check will depend on your age, overall health, lifestyle risk factors, and family history of disease. Counseling  Your health care provider may ask you questions about your: Alcohol use. Tobacco use. Drug use. Emotional well-being. Home and relationship well-being. Sexual  activity. Eating habits. History of falls. Memory and ability to understand (cognition). Work and work Astronomer. Reproductive health. Screening  You may have the following tests or measurements: Height, weight, and BMI. Blood pressure. Lipid and cholesterol levels. These may be checked every 5 years, or more frequently if you are over 37 years old. Skin check. Lung cancer screening. You may have this screening every year starting at age 27 if you have a 30-pack-year history of smoking and currently smoke or have quit within the past 15 years. Fecal occult blood test (FOBT) of the stool. You may have this test every year starting at age 15. Flexible sigmoidoscopy or colonoscopy. You may have a sigmoidoscopy every 5 years or a colonoscopy every 10 years starting at age 13. Hepatitis C blood test. Hepatitis B blood test. Sexually transmitted disease (STD) testing. Diabetes screening. This is done by checking your blood sugar (glucose) after you have not eaten for a while (fasting). You may have this done every 1-3 years. Bone density scan. This is done to screen for osteoporosis. You may have this done starting at age 35. Mammogram. This may be done every 1-2 years. Talk to your health care provider about how often you should have regular mammograms. Talk with your health care provider about your test results, treatment options, and if necessary, the need for more tests. Vaccines  Your health care provider may recommend certain vaccines, such as: Influenza vaccine. This is recommended every year. Tetanus, diphtheria, and acellular pertussis (Tdap, Td) vaccine. You may need a Td booster every 10 years. Zoster vaccine. You may need this after age 32. Pneumococcal 13-valent conjugate (PCV13) vaccine. One dose is recommended after age 76. Pneumococcal polysaccharide (PPSV23) vaccine. One dose is recommended after  age 25. Talk to your health care provider about which screenings and vaccines  you need and how often you need them. This information is not intended to replace advice given to you by your health care provider. Make sure you discuss any questions you have with your health care provider. Document Released: 03/02/2015 Document Revised: 10/24/2015 Document Reviewed: 12/05/2014 Elsevier Interactive Patient Education  2017 [employer] Prevention in the Home Falls can cause injuries. They can happen to people of all ages. There are many things you can do to make your home safe and to help prevent falls. What can I do on the outside of my home? Regularly fix the edges of walkways and driveways and fix any cracks. Remove anything that might make you trip as you walk through a door, such as a raised step or threshold. Trim any bushes or trees on the path to your home. Use bright outdoor lighting. Clear any walking paths of anything that might make someone trip, such as rocks or tools. Regularly check to see if handrails are loose or broken. Make sure that both sides of any steps have handrails. Any raised decks and porches should have guardrails on the edges. Have any leaves, snow, or ice cleared regularly. Use sand or salt on walking paths during winter. Clean up any spills in your garage right away. This includes oil or grease spills. What can I do in the bathroom? Use night lights. Install grab bars by the toilet and in the tub and shower. Do not use towel bars as grab bars. Use non-skid mats or decals in the tub or shower. If you need to sit down in the shower, use a plastic, non-slip stool. Keep the floor dry. Clean up any water that spills on the floor as soon as it happens. Remove soap buildup in the tub or shower regularly. Attach bath mats securely with double-sided non-slip rug tape. Do not have throw rugs and other things on the floor that can make you trip. What can I do in the bedroom? Use night lights. Make sure that you have a light by your bed that  is easy to reach. Do not use any sheets or blankets that are too big for your bed. They should not hang down onto the floor. Have a firm chair that has side arms. You can use this for support while you get dressed. Do not have throw rugs and other things on the floor that can make you trip. What can I do in the kitchen? Clean up any spills right away. Avoid walking on wet floors. Keep items that you use a lot in easy-to-reach places. If you need to reach something above you, use a strong step stool that has a grab bar. Keep electrical cords out of the way. Do not use floor polish or wax that makes floors slippery. If you must use wax, use non-skid floor wax. Do not have throw rugs and other things on the floor that can make you trip. What can I do with my stairs? Do not leave any items on the stairs. Make sure that there are handrails on both sides of the stairs and use them. Fix handrails that are broken or loose. Make sure that handrails are as long as the stairways. Check any carpeting to make sure that it is firmly attached to the stairs. Fix any carpet that is loose or worn. Avoid having throw rugs at the top or bottom of the stairs. If you do  have throw rugs, attach them to the floor with carpet tape. Make sure that you have a light switch at the top of the stairs and the bottom of the stairs. If you do not have them, ask someone to add them for you. What else can I do to help prevent falls? Wear shoes that: Do not have high heels. Have rubber bottoms. Are comfortable and fit you well. Are closed at the toe. Do not wear sandals. If you use a stepladder: Make sure that it is fully opened. Do not climb a closed stepladder. Make sure that both sides of the stepladder are locked into place. Ask someone to hold it for you, if possible. Clearly mark and make sure that you can see: Any grab bars or handrails. First and last steps. Where the edge of each step is. Use tools that help you  move around (mobility aids) if they are needed. These include: Canes. Walkers. Scooters. Crutches. Turn on the lights when you go into a dark area. Replace any light bulbs as soon as they burn out. Set up your furniture so you have a clear path. Avoid moving your furniture around. If any of your floors are uneven, fix them. If there are any pets around you, be aware of where they are. Review your medicines with your doctor. Some medicines can make you feel dizzy. This can increase your chance of falling. Ask your doctor what other things that you can do to help prevent falls. This information is not intended to replace advice given to you by your health care provider. Make sure you discuss any questions you have with your health care provider. Document Released: 11/30/2008 Document Revised: 07/12/2015 Document Reviewed: 03/10/2014 Elsevier Interactive Patient Education  2017 [employer].

## 2021-10-29 NOTE — Progress Notes (Signed)
This service is provided via telemedicine  No vital signs collected/recorded due to the encounter was a telemedicine visit.   Location of patient (ex: home, work):  Home  Patient consents to a telephone visit:  Yes, see encounter dated 10/29/2021  Location of the provider (ex: office, home):  [facility]  Name of any referring provider:  Mast, Man X, NP  Names of all persons participating in the telemedicine service and their role in the encounter:  Abbey Chatters, Nurse Practitioner, [name], CMA, and patient.    Time spent on call:  21 minutes with medical assistant

## 2021-10-30 DIAGNOSIS — S32000D Wedge compression fracture of unspecified lumbar vertebra, subsequent encounter for fracture with routine healing: Secondary | ICD-10-CM | POA: Diagnosis not present

## 2021-10-30 DIAGNOSIS — R29898 Other symptoms and signs involving the musculoskeletal system: Secondary | ICD-10-CM | POA: Diagnosis not present

## 2021-10-30 DIAGNOSIS — G40909 Epilepsy, unspecified, not intractable, without status epilepticus: Secondary | ICD-10-CM | POA: Diagnosis not present

## 2021-10-30 DIAGNOSIS — R296 Repeated falls: Secondary | ICD-10-CM | POA: Diagnosis not present

## 2021-10-30 DIAGNOSIS — M6281 Muscle weakness (generalized): Secondary | ICD-10-CM | POA: Diagnosis not present

## 2021-10-30 DIAGNOSIS — S72145D Nondisplaced intertrochanteric fracture of left femur, subsequent encounter for closed fracture with routine healing: Secondary | ICD-10-CM | POA: Diagnosis not present

## 2021-10-31 DIAGNOSIS — S72145D Nondisplaced intertrochanteric fracture of left femur, subsequent encounter for closed fracture with routine healing: Secondary | ICD-10-CM | POA: Diagnosis not present

## 2021-10-31 DIAGNOSIS — R29898 Other symptoms and signs involving the musculoskeletal system: Secondary | ICD-10-CM | POA: Diagnosis not present

## 2021-10-31 DIAGNOSIS — G40909 Epilepsy, unspecified, not intractable, without status epilepticus: Secondary | ICD-10-CM | POA: Diagnosis not present

## 2021-10-31 DIAGNOSIS — M6281 Muscle weakness (generalized): Secondary | ICD-10-CM | POA: Diagnosis not present

## 2021-10-31 DIAGNOSIS — S32000D Wedge compression fracture of unspecified lumbar vertebra, subsequent encounter for fracture with routine healing: Secondary | ICD-10-CM | POA: Diagnosis not present

## 2021-10-31 DIAGNOSIS — R296 Repeated falls: Secondary | ICD-10-CM | POA: Diagnosis not present

## 2021-11-01 NOTE — Progress Notes (Signed)
[facility] - Clinic Note  11/04/2021     CHIEF COMPLAINT Patient presents for Retina Follow Up  HISTORY OF PRESENT ILLNESS: Erin Ochoa is a 81 y.o. female who presents to the clinic today for:   HPI     Retina Follow Up   Patient presents with  Dry AMD.  In both eyes.  This started 6 months ago.  I, the attending physician,  performed the HPI with the patient and updated documentation appropriately.        Comments   Patient here for 6 months retina follow up for non exu ARMD OU. Patient states vision doing ok. No eye pain. Recently put smooth XP drops in eyes. They itched a little. They did today the second time used drops today. Since last visit fell and broke left femer. Had a rod put in leg. After taken off pain meds. Got vertigo and fell and crushed 2 vertebrates. Had to be cemented.      Last edited by Rennis Chris, MD on 11/04/2021 10:35 PM.    Since she was here last, pt fell and broke her left femur, she had sx and now has a rod in her leg, she states the morning after she came home from assisted living, she had a bout of vertigo and fell straight down and crushed 2 vertebrae, she states they had to be glued back together, pt is using Soothe XP QID  Referring physician: Mast, Man X, NP [id number] [place],  [place] [address]  HISTORICAL INFORMATION:   Selected notes from the MEDICAL RECORD NUMBER Referred by Dr. Elmer Picker for progressing ARMD  LEE:  Ocular Hx- PMH-    CURRENT MEDICATIONS: Current Outpatient Medications (Ophthalmic Drugs)  Medication Sig   Artificial Tear Solution (SOOTHE XP) SOLN Place 1 drop into both eyes 4 (four) times daily.   No current facility-administered medications for this visit. (Ophthalmic Drugs)   Current Outpatient Medications (Other)  Medication Sig   acetaminophen (TYLENOL) 500 MG tablet Take 1,000 mg by mouth 2 (two) times a week.   atorvastatin (LIPITOR) 10 MG tablet Take by mouth daily. 1/2 tablet    Calcium Carbonate-Vit D-Min (CALTRATE 600+D PLUS MINERALS) 600-800 MG-UNIT CHEW Chew 2 each by mouth daily.   carbamazepine (TEGRETOL) 200 MG tablet Take 200 mg by mouth daily. 1 & 1/2 tablets   clonazePAM (KLONOPIN) 1 MG tablet Take 1 tablet (1 mg total) by mouth at bedtime.   hydrocortisone cream 1 % Apply 1 Application topically 2 (two) times daily. Apply to epigastric area skin until burning sensation improved.   meclizine (ANTIVERT) 12.5 MG tablet Take 12.5 mg by mouth 2 (two) times a week.   meclizine (ANTIVERT) 25 MG tablet Take 0.5 tablets (12.5 mg total) by mouth 3 (three) times daily as needed for dizziness.   Menthol, Topical Analgesic, (BIOFREEZE EX) Apply topically.   Multiple Vitamins-Minerals (PRESERVISION AREDS PO) Take 1 tablet by mouth 2 (two) times daily.   Nutritional Supplements (ENSURE MAX PROTEIN PO) Take 1 Bottle by mouth daily.   polyethylene glycol powder (GLYCOLAX/MIRALAX) 17 GM/SCOOP powder Take 17 g by mouth daily.   Vitamin D, Ergocalciferol, (DRISDOL) 1.25 MG (50000 UNIT) CAPS capsule Take 50,000 Units by mouth every 7 (seven) days.   cetirizine (ZYRTEC) 10 MG tablet Take 10 mg by mouth daily.   senna-docusate (SENOKOT-S) 8.6-50 MG tablet Take 2 tablets by mouth 2 (two) times daily. (Patient not taking: Reported on 10/29/2021)   No current  facility-administered medications for this visit. (Other)   REVIEW OF SYSTEMS: ROS   Positive for: Eyes Negative for: Constitutional, Gastrointestinal, Neurological, Skin, Genitourinary, Musculoskeletal, HENT, Endocrine, Cardiovascular, Respiratory, Psychiatric, Allergic/Imm, Heme/Lymph Last edited by Laddie Aquas, COA on 11/04/2021  1:31 PM.     ALLERGIES Allergies  Allergen Reactions   Cat Hair Extract Other (See Comments)    Patient states that it causes her eyes to swell shut   Cefaclor Other (See Comments)    Unknown reaction   Codeine Nausea Only   Fish Allergy Nausea Only   Iodinated Contrast Media Other  (See Comments)    Unknown reaction     Other Other (See Comments)    Seeds -- Reflux   Penicillins Itching   Percocet [Oxycodone-Acetaminophen] Nausea And Vomiting   Red Dye Other (See Comments)    Reflux   Senna-Docusate Sodium [Sennosides-Docusate Sodium] Other (See Comments)    Severe stomach pains   Shrimp (Diagnostic) Nausea Only    Stomach pain    Tomato Other (See Comments)    Reflux   Hm Lidocaine Patch [Lidocaine] Rash   PAST MEDICAL HISTORY Past Medical History:  Diagnosis Date   Anxiety    Cataract    Chronic constipation    Depression    Osteoporosis    Seizures ([name])    epilepsy  last seizure 1977   Past Surgical History:  Procedure Laterality Date   ABDOMINAL HYSTERECTOMY  1995   Dr. Carlis Abbott   CATARACT EXTRACTION Bilateral 12/2016   COLONOSCOPY  2012   patchy increased intraepithelial lymphocytes - draelos   ESOPHAGOGASTRODUODENOSCOPY     multiple   FISSURECTOMY     INTRAMEDULLARY (IM) NAIL INTERTROCHANTERIC Left 05/09/2021   Procedure: INTRAMEDULLARY (IM) NAIL INTERTROCHANTRIC;  Surgeon: Cammy Copa, MD;  Location: [facility] OR;  Service: Orthopedics;  Laterality: Left;   IR KYPHO EA ADDL LEVEL THORACIC OR LUMBAR  06/26/2021   IR KYPHO THORACIC WITH BONE BIOPSY  06/26/2021   WRIST SURGERY Left    due to fracture   FAMILY HISTORY Family History  Problem Relation Age of Onset   Alcohol abuse Father    Diabetes Father    Diabetes Maternal Grandmother    Diabetes Paternal Grandmother    Colon cancer Maternal Grandfather    Esophageal cancer Neg Hx    Rectal cancer Neg Hx    Stomach cancer Neg Hx    SOCIAL HISTORY Social History   Tobacco Use   Smoking status: Former    Packs/day: 1.00    Types: Cigarettes    Start date: 02/18/1956    Quit date: 02/17/1981    Years since quitting: 40.7   Smokeless tobacco: Never  Vaping Use   Vaping Use: Never used  Substance Use Topics   Alcohol use: No    Alcohol/week: 0.0 standard drinks of alcohol    Drug use: No       OPHTHALMIC EXAM: Base Eye Exam     Visual Acuity (Snellen - Linear)       Right Left   Dist cc 20/20 -2 20/20 -1    Correction: Glasses         Tonometry (Tonopen, 1:25 PM)       Right Left   Pressure 16 14         Pupils       Dark Light Shape React APD   Right 3 2 Round Brisk None   Left 3 2 Round Brisk None  Visual Fields (Counting fingers)       Left Right    Full Full         Extraocular Movement       Right Left    Full, Ortho Full, Ortho         Neuro/Psych     Oriented x3: Yes   Mood/Affect: Normal         Dilation     Both eyes: 1.0% Mydriacyl, 2.5% Phenylephrine @ 1:25 PM           Slit Lamp and Fundus Exam     Slit Lamp Exam       Right Left   Lids/Lashes Dermatochalasis - upper lid, mild MGD Dermatochalasis - upper lid, mild MGD   Conjunctiva/Sclera White and quiet White and quiet   Cornea mild arcus, 3+ Punctate epithelial erosions with irregular epi, mild EBMD 2-3+ Punctate epithelial erosions with irregular epi inferiorly, arcus   Anterior Chamber deep and clear deep and clear   Iris Round and dilated Round and dilated   Lens PC IOL in good position PC IOL in good position, 1+ Posterior capsular opacification   Anterior Vitreous Vitreous syneresis Vitreous syneresis, Posterior vitreous detachment, vitreous condensations         Fundus Exam       Right Left   Disc Pink and Sharp Pink and Sharp   C/D Ratio 0.6 0.6   Macula Flat, Blunted foveal reflex, Drusen, RPE mottling, +PED, No heme or edema Flat, Blunted foveal reflex, central PEDs, Drusen, RPE mottling and clumping, No heme or edema   Vessels attenuated, mild tortuosity mild tortuousity, attenuated, Tortuous, mild copper wiring   Periphery Attached, mild reticular degeneration, No heme Attached, mild reticular degeneration, No heme           Refraction     Wearing Rx       Sphere Cylinder Axis Add   Right -1.75 +1.25  002 +2.50   Left -1.00 +1.75 172 +2.50    Type: PAL           IMAGING AND PROCEDURES  Imaging and Procedures for 11/04/2021  OCT, Retina - OU - Both Eyes       Right Eye Quality was good. Central Foveal Thickness: 304. Progression has been stable. Findings include normal foveal contour, no IRF, no SRF, retinal drusen , pigment epithelial detachment.   Left Eye Quality was good. Central Foveal Thickness: 309. Progression has been stable. Findings include normal foveal contour, no IRF, no SRF, retinal drusen , pigment epithelial detachment (Prominent central PEDs ).   Notes *Images captured and stored on drive  Diagnosis / Impression:  Non-exu ARMD OU - prominent PEDs OU -- stable  Clinical management:  See below  Abbreviations: NFP - Normal foveal profile. CME - cystoid macular edema. PED - pigment epithelial detachment. IRF - intraretinal fluid. SRF - subretinal fluid. EZ - ellipsoid zone. ERM - epiretinal membrane. ORA - outer retinal atrophy. ORT - outer retinal tubulation. SRHM - subretinal hyper-reflective material. IRHM - intraretinal hyper-reflective material            ASSESSMENT/PLAN:    ICD-10-CM   1. Intermediate stage nonexudative age-related macular degeneration of both eyes  H35.3132 OCT, Retina - OU - Both Eyes    2. Pseudophakia, both eyes  Z96.1       1. Age related macular degeneration, non-exudative, both eyes  - intermediate stage with prominent PEDs OU (OS > OD) -- stable  -  OCT without significant change from prior  - Recommend amsler grid monitoring  - f/u 6-9 months, DFE, OCT  2. Pseudophakia OU  - s/p CE/IOL (Dr. Elmer Picker)  - IOLs in good position, doing well  - monitor  Ophthalmic Meds Ordered this visit:  No orders of the defined types were placed in this encounter.    Return for f/u 6-9 months, non-exu ARMD OU, DFE, OCT.  There are no Patient Instructions on file for this visit.   Explained the diagnoses, plan, and follow up  with the patient and they expressed understanding.  Patient expressed understanding of the importance of proper follow up care.   This document serves as a record of services personally performed by Karie Chimera, MD, PhD. It was created on their behalf by Annalee Genta, COMT. The creation of this record is the provider's dictation and/or activities during the visit.  Electronically signed by: Annalee Genta, COMT 11/04/21 10:36 PM  This document serves as a record of services personally performed by Karie Chimera, MD, PhD. It was created on their behalf by Glee Arvin. Manson Passey, OA an ophthalmic technician. The creation of this record is the provider's dictation and/or activities during the visit.    Electronically signed by: Glee Arvin. Kristopher Oppenheim 09.18.2023 10:36 PM   Karie Chimera, M.D., Ph.D. Diseases & Surgery of the Retina and Vitreous [facility]     Abbreviations: M myopia (nearsighted); A astigmatism; H hyperopia (farsighted); P presbyopia; Mrx spectacle prescription;  CTL contact lenses; OD right eye; OS left eye; OU both eyes  XT exotropia; ET esotropia; PEK punctate epithelial keratitis; PEE punctate epithelial erosions; DES dry eye syndrome; MGD meibomian gland dysfunction; ATs artificial tears; PFAT's preservative free artificial tears; NSC nuclear sclerotic cataract; PSC posterior subcapsular cataract; ERM epi-retinal membrane; PVD posterior vitreous detachment; RD retinal detachment; DM diabetes mellitus; DR diabetic retinopathy; NPDR non-proliferative diabetic retinopathy; PDR proliferative diabetic retinopathy; CSME clinically significant macular edema; DME diabetic macular edema; dbh dot blot hemorrhages; CWS cotton wool spot; POAG primary open angle glaucoma; C/D cup-to-disc ratio; HVF humphrey visual field; GVF goldmann visual field; OCT optical coherence tomography; IOP intraocular pressure; BRVO Branch retinal vein occlusion; CRVO central retinal vein  occlusion; CRAO central retinal artery occlusion; BRAO branch retinal artery occlusion; RT retinal tear; SB scleral buckle; PPV pars plana vitrectomy; VH Vitreous hemorrhage; PRP panretinal laser photocoagulation; IVK intravitreal kenalog; VMT vitreomacular traction; MH Macular hole;  NVD neovascularization of the disc; NVE neovascularization elsewhere; AREDS age related eye disease study; ARMD age related macular degeneration; POAG primary open angle glaucoma; EBMD epithelial/anterior basement membrane dystrophy; ACIOL anterior chamber intraocular lens; IOL intraocular lens; PCIOL posterior chamber intraocular lens; Phaco/IOL phacoemulsification with intraocular lens placement; PRK photorefractive keratectomy; LASIK laser assisted in situ keratomileusis; HTN hypertension; DM diabetes mellitus; COPD chronic obstructive pulmonary disease

## 2021-11-04 ENCOUNTER — Encounter (INDEPENDENT_AMBULATORY_CARE_PROVIDER_SITE_OTHER): Payer: Self-pay | Admitting: Ophthalmology

## 2021-11-04 ENCOUNTER — Ambulatory Visit (INDEPENDENT_AMBULATORY_CARE_PROVIDER_SITE_OTHER): Payer: Medicare Other | Admitting: Ophthalmology

## 2021-11-04 DIAGNOSIS — H353132 Nonexudative age-related macular degeneration, bilateral, intermediate dry stage: Secondary | ICD-10-CM

## 2021-11-04 DIAGNOSIS — R29898 Other symptoms and signs involving the musculoskeletal system: Secondary | ICD-10-CM | POA: Diagnosis not present

## 2021-11-04 DIAGNOSIS — Z961 Presence of intraocular lens: Secondary | ICD-10-CM

## 2021-11-04 DIAGNOSIS — S32000D Wedge compression fracture of unspecified lumbar vertebra, subsequent encounter for fracture with routine healing: Secondary | ICD-10-CM | POA: Diagnosis not present

## 2021-11-04 DIAGNOSIS — G40909 Epilepsy, unspecified, not intractable, without status epilepticus: Secondary | ICD-10-CM | POA: Diagnosis not present

## 2021-11-04 DIAGNOSIS — S72145D Nondisplaced intertrochanteric fracture of left femur, subsequent encounter for closed fracture with routine healing: Secondary | ICD-10-CM | POA: Diagnosis not present

## 2021-11-04 DIAGNOSIS — R296 Repeated falls: Secondary | ICD-10-CM | POA: Diagnosis not present

## 2021-11-04 DIAGNOSIS — M6281 Muscle weakness (generalized): Secondary | ICD-10-CM | POA: Diagnosis not present

## 2021-11-06 DIAGNOSIS — M6281 Muscle weakness (generalized): Secondary | ICD-10-CM | POA: Diagnosis not present

## 2021-11-06 DIAGNOSIS — G40909 Epilepsy, unspecified, not intractable, without status epilepticus: Secondary | ICD-10-CM | POA: Diagnosis not present

## 2021-11-06 DIAGNOSIS — R29898 Other symptoms and signs involving the musculoskeletal system: Secondary | ICD-10-CM | POA: Diagnosis not present

## 2021-11-06 DIAGNOSIS — S32000D Wedge compression fracture of unspecified lumbar vertebra, subsequent encounter for fracture with routine healing: Secondary | ICD-10-CM | POA: Diagnosis not present

## 2021-11-06 DIAGNOSIS — R296 Repeated falls: Secondary | ICD-10-CM | POA: Diagnosis not present

## 2021-11-06 DIAGNOSIS — S72145D Nondisplaced intertrochanteric fracture of left femur, subsequent encounter for closed fracture with routine healing: Secondary | ICD-10-CM | POA: Diagnosis not present

## 2021-11-07 DIAGNOSIS — R296 Repeated falls: Secondary | ICD-10-CM | POA: Diagnosis not present

## 2021-11-07 DIAGNOSIS — M6281 Muscle weakness (generalized): Secondary | ICD-10-CM | POA: Diagnosis not present

## 2021-11-07 DIAGNOSIS — G40909 Epilepsy, unspecified, not intractable, without status epilepticus: Secondary | ICD-10-CM | POA: Diagnosis not present

## 2021-11-07 DIAGNOSIS — R29898 Other symptoms and signs involving the musculoskeletal system: Secondary | ICD-10-CM | POA: Diagnosis not present

## 2021-11-07 DIAGNOSIS — S72145D Nondisplaced intertrochanteric fracture of left femur, subsequent encounter for closed fracture with routine healing: Secondary | ICD-10-CM | POA: Diagnosis not present

## 2021-11-07 DIAGNOSIS — S32000D Wedge compression fracture of unspecified lumbar vertebra, subsequent encounter for fracture with routine healing: Secondary | ICD-10-CM | POA: Diagnosis not present

## 2021-11-11 DIAGNOSIS — R296 Repeated falls: Secondary | ICD-10-CM | POA: Diagnosis not present

## 2021-11-11 DIAGNOSIS — S72145D Nondisplaced intertrochanteric fracture of left femur, subsequent encounter for closed fracture with routine healing: Secondary | ICD-10-CM | POA: Diagnosis not present

## 2021-11-11 DIAGNOSIS — M6281 Muscle weakness (generalized): Secondary | ICD-10-CM | POA: Diagnosis not present

## 2021-11-11 DIAGNOSIS — G40909 Epilepsy, unspecified, not intractable, without status epilepticus: Secondary | ICD-10-CM | POA: Diagnosis not present

## 2021-11-11 DIAGNOSIS — S32000D Wedge compression fracture of unspecified lumbar vertebra, subsequent encounter for fracture with routine healing: Secondary | ICD-10-CM | POA: Diagnosis not present

## 2021-11-11 DIAGNOSIS — R29898 Other symptoms and signs involving the musculoskeletal system: Secondary | ICD-10-CM | POA: Diagnosis not present

## 2021-11-13 DIAGNOSIS — R296 Repeated falls: Secondary | ICD-10-CM | POA: Diagnosis not present

## 2021-11-13 DIAGNOSIS — R29898 Other symptoms and signs involving the musculoskeletal system: Secondary | ICD-10-CM | POA: Diagnosis not present

## 2021-11-13 DIAGNOSIS — S72145D Nondisplaced intertrochanteric fracture of left femur, subsequent encounter for closed fracture with routine healing: Secondary | ICD-10-CM | POA: Diagnosis not present

## 2021-11-13 DIAGNOSIS — G40909 Epilepsy, unspecified, not intractable, without status epilepticus: Secondary | ICD-10-CM | POA: Diagnosis not present

## 2021-11-13 DIAGNOSIS — M6281 Muscle weakness (generalized): Secondary | ICD-10-CM | POA: Diagnosis not present

## 2021-11-13 DIAGNOSIS — S32000D Wedge compression fracture of unspecified lumbar vertebra, subsequent encounter for fracture with routine healing: Secondary | ICD-10-CM | POA: Diagnosis not present

## 2021-11-21 ENCOUNTER — Non-Acute Institutional Stay: Payer: Medicare Other | Admitting: Nurse Practitioner

## 2021-11-21 ENCOUNTER — Encounter: Payer: Self-pay | Admitting: Nurse Practitioner

## 2021-11-21 VITALS — BP 100/54 | HR 81 | Temp 98.4°F | Resp 18 | Ht 65.0 in | Wt 108.0 lb

## 2021-11-21 DIAGNOSIS — R7303 Prediabetes: Secondary | ICD-10-CM | POA: Diagnosis not present

## 2021-11-21 DIAGNOSIS — Z0389 Encounter for observation for other suspected diseases and conditions ruled out: Secondary | ICD-10-CM

## 2021-11-21 DIAGNOSIS — E785 Hyperlipidemia, unspecified: Secondary | ICD-10-CM | POA: Diagnosis not present

## 2021-11-21 DIAGNOSIS — R42 Dizziness and giddiness: Secondary | ICD-10-CM

## 2021-11-21 DIAGNOSIS — M810 Age-related osteoporosis without current pathological fracture: Secondary | ICD-10-CM | POA: Diagnosis not present

## 2021-11-21 DIAGNOSIS — Z8669 Personal history of other diseases of the nervous system and sense organs: Secondary | ICD-10-CM | POA: Diagnosis not present

## 2021-11-21 DIAGNOSIS — K5904 Chronic idiopathic constipation: Secondary | ICD-10-CM

## 2021-11-21 DIAGNOSIS — E2839 Other primary ovarian failure: Secondary | ICD-10-CM | POA: Diagnosis not present

## 2021-11-21 DIAGNOSIS — D500 Iron deficiency anemia secondary to blood loss (chronic): Secondary | ICD-10-CM | POA: Diagnosis not present

## 2021-11-21 DIAGNOSIS — M1990 Unspecified osteoarthritis, unspecified site: Secondary | ICD-10-CM | POA: Diagnosis not present

## 2021-11-21 NOTE — Assessment & Plan Note (Signed)
Hgb 12.9 07/05/21 

## 2021-11-21 NOTE — Progress Notes (Signed)
Location:   clinic [place]   Place of Service:  Clinic (12) Provider: Marlana Latus NP  Code Status: DNR Goals of Care: [place]    11/21/2021   10:01 AM  Advanced Directives  Does Patient Have a Medical Advance Directive? Yes  Type of Advance Directive [place]  Does patient want to make changes to medical advance directive? No - Patient declined  Copy of [place] in Chart? Yes - validated most recent copy scanned in chart (See row information)     Chief Complaint  Patient presents with   Medical Management of Chronic Issues    Patient is here for a 3 month follow up   Quality Metric Gaps    Patient is due for flu and covid booster Mammogram Urine ACR, and A1C    HPI: Patient is a 81 y.o. female seen today for medical management of chronic diseases.    OA, s/p left hip surgery, s/p Kyphplasty             Dizziness: better, on Antivert prn, rarely used, MRI brain 06/25/21, 1. No acute finding, including infarct. 2. Generalized brain atrophy.              Post op anemia, Hgb 12.9 07/05/21             Seizures, no active seizures since last seen, failed generic Tegretol in the past, on Tegretol 200mg  qd, Clonazepam hs (GDR to stop on her own didn't cause active seizures, but flare up insomnia and anxiety). S/p Neurology              Constipation, stable, on MiraLax, Senokot S II bid.              Hyperlipidemia, takes Atorvastatin 5mg  qd, LDL 79 08/15/20             Prediabetic, Hgb a1c 5.7 04/25/21             OP takes Reclast, t-score -3.6 09/13/19             Insomnia, takes Clonazepam, failed GDR,  failed Ambien. TSH 3.2 12/21/20             Erosive gastropathy, off PPI  Past Medical History:  Diagnosis Date   Anxiety    Cataract    Chronic constipation    Depression    Osteoporosis    Seizures ([place])    epilepsy  last seizure 1977    Past Surgical History:  Procedure Laterality Date   ABDOMINAL HYSTERECTOMY  1995   Dr. Edwyna Shell    CATARACT EXTRACTION Bilateral 12/2016   COLONOSCOPY  2012   patchy increased intraepithelial lymphocytes - draelos   ESOPHAGOGASTRODUODENOSCOPY     multiple   FISSURECTOMY     INTRAMEDULLARY (IM) NAIL INTERTROCHANTERIC Left 05/09/2021   Procedure: INTRAMEDULLARY (IM) NAIL INTERTROCHANTRIC;  Surgeon: Meredith Pel, MD;  Location: [place];  Service: Orthopedics;  Laterality: Left;   IR KYPHO EA ADDL LEVEL THORACIC OR LUMBAR  06/26/2021   IR KYPHO THORACIC WITH BONE BIOPSY  06/26/2021   WRIST SURGERY Left    due to fracture    Allergies  Allergen Reactions   Cat Hair Extract Other (See Comments)    Patient states that it causes her eyes to swell shut   Cefaclor Other (See Comments)    Unknown reaction   Codeine Nausea Only   Fish Allergy Nausea Only   Iodinated Contrast Media Other (See  Comments)    Unknown reaction     Other Other (See Comments)    Seeds -- Reflux   Penicillins Itching   Percocet [Oxycodone-Acetaminophen] Nausea And Vomiting   Red Dye Other (See Comments)    Reflux   Senna-Docusate Sodium [Sennosides-Docusate Sodium] Other (See Comments)    Severe stomach pains   Shrimp (Diagnostic) Nausea Only    Stomach pain    Tomato Other (See Comments)    Reflux   Hm Lidocaine Patch [Lidocaine] Rash    Allergies as of 11/21/2021       Reactions   Cat Hair Extract Other (See Comments)   Patient states that it causes her eyes to swell shut   Cefaclor Other (See Comments)   Unknown reaction   Codeine Nausea Only   Fish Allergy Nausea Only   Iodinated Contrast Media Other (See Comments)   Unknown reaction   Other Other (See Comments)   Seeds -- Reflux   Penicillins Itching   Percocet [oxycodone-acetaminophen] Nausea And Vomiting   Red Dye Other (See Comments)   Reflux   Senna-docusate Sodium [sennosides-docusate Sodium] Other (See Comments)   Severe stomach pains   Shrimp (diagnostic) Nausea Only   Stomach pain   Tomato Other (See Comments)   Reflux    Hm Lidocaine Patch [lidocaine] Rash        Medication List        Accurate as of November 21, 2021 11:59 PM. If you have any questions, ask your nurse or doctor.          acetaminophen 500 MG tablet Commonly known as: TYLENOL Take 1,000 mg by mouth 2 (two) times a week.   atorvastatin 10 MG tablet Commonly known as: LIPITOR Take by mouth daily. 1/2 tablet   BIOFREEZE EX Apply topically.   Caltrate 600+D Plus Minerals 600-800 MG-UNIT Chew Chew 2 each by mouth daily.   carbamazepine 200 MG tablet Commonly known as: TEGRETOL Take 200 mg by mouth daily. 1 & 1/2 tablets   cetirizine 10 MG tablet Commonly known as: ZYRTEC Take 10 mg by mouth daily.   clonazePAM 1 MG tablet Commonly known as: KLONOPIN Take 1 tablet (1 mg total) by mouth at bedtime.   ENSURE MAX PROTEIN PO Take 1 Bottle by mouth daily.   hydrocortisone cream 1 % Apply 1 Application topically 2 (two) times daily. Apply to epigastric area skin until burning sensation improved.   meclizine 12.5 MG tablet Commonly known as: ANTIVERT Take 12.5 mg by mouth 2 (two) times a week.   meclizine 25 MG tablet Commonly known as: ANTIVERT Take 0.5 tablets (12.5 mg total) by mouth 3 (three) times daily as needed for dizziness.   polyethylene glycol powder 17 GM/SCOOP powder Commonly known as: GLYCOLAX/MIRALAX Take 17 g by mouth daily.   PRESERVISION AREDS PO Take 1 tablet by mouth 2 (two) times daily.   senna-docusate 8.6-50 MG tablet Commonly known as: Senokot-S Take 2 tablets by mouth 2 (two) times daily.   Soothe XP Soln Place 1 drop into both eyes 4 (four) times daily.   Vitamin D (Ergocalciferol) 1.25 MG (50000 UNIT) Caps capsule Commonly known as: DRISDOL Take 50,000 Units by mouth every 7 (seven) days.        Review of Systems:  Review of Systems  Constitutional:  Negative for appetite change, fatigue and fever.  HENT:  Positive for hearing loss. Negative for congestion and voice change.    Eyes:  Negative for visual disturbance.  Respiratory:  Negative for shortness of breath.   Cardiovascular:  Negative for leg swelling.  Gastrointestinal:  Negative for abdominal pain, constipation, diarrhea, nausea and vomiting.  Genitourinary:  Positive for frequency. Negative for dysuria and urgency.       The patient stated she pushes her suprapubic region to help emptying her bladder  Musculoskeletal:  Positive for arthralgias, back pain and gait problem.       Left hip pain when walking, better.   Skin:  Negative for color change.       Left hip surgical incision is healed.   Neurological:  Positive for numbness. Negative for seizures and weakness.       Tingling/numbness left foot sometimes. Occasionally left hip/leg pain sometimes. Lightheaded when not sleeping well in lifetime.   Psychiatric/Behavioral:  Positive for sleep disturbance. Negative for behavioral problems. The patient is not nervous/anxious.        Feels lightheaded if not sleep well at night.    Health Maintenance  Topic Date Due   MAMMOGRAM  08/13/2017   Diabetic kidney evaluation - Urine ACR  04/19/2021   COVID-19 Vaccine (7 - Moderna risk series) 08/30/2021   INFLUENZA VACCINE  09/17/2021   HEMOGLOBIN A1C  10/26/2021   FOOT EXAM  04/11/2022   OPHTHALMOLOGY EXAM  05/07/2022   Diabetic kidney evaluation - GFR measurement  07/06/2022   TETANUS/TDAP  08/21/2027   Pneumonia Vaccine 81+ Years old  Completed   DEXA SCAN  Completed   Zoster Vaccines- Shingrix  Completed   HPV VACCINES  Aged Out    Physical Exam: Vitals:   11/21/21 1519  BP: (!) 100/54  Pulse: 81  Resp: 18  Temp: 98.4 F (36.9 C)  SpO2: 96%  Weight: 108 lb (49 kg)  Height: 5\' 5"  (1.651 m)   Body mass index is 17.97 kg/m. Physical Exam Vitals and nursing note reviewed.  Constitutional:      Appearance: Normal appearance.  HENT:     Head: Normocephalic and atraumatic.     Mouth/Throat:     Mouth: Mucous membranes are moist.   Eyes:     Extraocular Movements: Extraocular movements intact.     Conjunctiva/sclera: Conjunctivae normal.     Pupils: Pupils are equal, round, and reactive to light.  Cardiovascular:     Rate and Rhythm: Normal rate and regular rhythm.     Heart sounds: No murmur heard. Pulmonary:     Effort: Pulmonary effort is normal.     Breath sounds: No rales.  Abdominal:     General: Bowel sounds are normal.     Palpations: Abdomen is soft.     Tenderness: There is no abdominal tenderness.  Musculoskeletal:     Cervical back: Normal range of motion and neck supple.     Right lower leg: No edema.     Left lower leg: No edema.     Comments: S/p left hip ORIF.   Skin:    General: Skin is warm and dry.     Comments: Left hip surgical scar  Neurological:     General: No focal deficit present.     Mental Status: She is alert and oriented to person, place, and time. Mental status is at baseline.     Gait: Gait abnormal.     Comments: Ambulates with walker.   Psychiatric:        Mood and Affect: Mood normal.        Behavior: Behavior normal.  Thought Content: Thought content normal.        Judgment: Judgment normal.     Labs reviewed: Basic Metabolic Panel: Recent Labs    12/21/20 0000 05/09/21 1406 06/21/21 1145 06/22/21 0312 06/26/21 0056 07/05/21 0000  NA 140   < > 130* 133* 130* 136*  K 4.0   < > 4.2 3.7 4.0 4.3  CL 105   < > 94* 100 97* 97*  CO2 30*   < > 28 28 26  27*  GLUCOSE  --    < > 170* 155* 186*  --   BUN 21   < > 13 9 13 13   CREATININE 0.7   < > 0.60 0.52 0.54 0.5  CALCIUM 9.4   < > 9.4 8.7* 9.2 8.9  TSH 3.20  --   --   --   --   --    < > = values in this interval not displayed.   Liver Function Tests: Recent Labs    12/21/20 0000 05/16/21 0000 06/21/21 1145  AST 17 28 30   ALT 16 30 32  ALKPHOS 38 47 99  BILITOT  --   --  0.5  PROT  --   --  6.6  ALBUMIN 3.8 3.3* 4.3   No results for input(s): "LIPASE", "AMYLASE" in the last 8760 hours. No  results for input(s): "AMMONIA" in the last 8760 hours. CBC: Recent Labs    05/12/21 0506 05/16/21 0000 06/21/21 1145 06/26/21 0056 07/05/21 0000  WBC 7.6   < > 10.0 6.1 4.9  NEUTROABS  --    < > 8.3* 3.9 2,685.00  HGB 9.4*   < > 13.0 13.1 12.9  HCT 29.2*   < > 39.7 38.2 39  MCV 99.7  --  97.3 96.5  --   PLT 152   < > 215 194 300   < > = values in this interval not displayed.   Lipid Panel: No results for input(s): "CHOL", "HDL", "LDLCALC", "TRIG", "CHOLHDL", "LDLDIRECT" in the last 8760 hours. Lab Results  Component Value Date   HGBA1C 5.7 (H) 04/25/2021    Procedures since last visit: OCT, Retina - OU - Both Eyes  Result Date: 11/04/2021 Right Eye Quality was good. Central Foveal Thickness: 304. Progression has been stable. Findings include normal foveal contour, no IRF, no SRF, retinal drusen , pigment epithelial detachment. Left Eye Quality was good. Central Foveal Thickness: 309. Progression has been stable. Findings include normal foveal contour, no IRF, no SRF, retinal drusen , pigment epithelial detachment (Prominent central PEDs ). Notes *Images captured and stored on drive Diagnosis / Impression: Non-exu ARMD OU - prominent PEDs OU -- stable Clinical management: See below Abbreviations: NFP - Normal foveal profile. CME - cystoid macular edema. PED - pigment epithelial detachment. IRF - intraretinal fluid. SRF - subretinal fluid. EZ - ellipsoid zone. ERM - epiretinal membrane. ORA - outer retinal atrophy. ORT - outer retinal tubulation. SRHM - subretinal hyper-reflective material. IRHM - intraretinal hyper-reflective material    Assessment/Plan  Osteoarthritis s/p left hip surgery, s/p Kyphplasty, ambulates with walker.   Vertigo  better, on Antivert prn, rarely used, MRI brain 06/25/21, 1. No acute finding, including infarct. 2. Generalized brain atrophy.  Blood loss anemia Hgb 12.9 07/05/21  History of epilepsy no active seizures since last seen, failed generic  Tegretol in the past, on Tegretol 200mg  qd, Clonazepam hs (GDR to stop on her own didn't cause active seizures, but flare up insomnia and anxiety). S/p Neurology  Constipation stable, on MiraLax, Senokot S II bid.   Hyperlipidemia takes Atorvastatin 5mg  qd, LDL 79 08/15/20  Pre-diabetes Hgb a1c 5.7 04/25/21  Osteoporosis  takes Reclast, t-score -3.6 09/13/19   Labs/tests ordered:  DEXA Mammogram  Next appt:  3 months.

## 2021-11-21 NOTE — Assessment & Plan Note (Signed)
takes Reclast, t-score -3.6 09/13/19 

## 2021-11-21 NOTE — Assessment & Plan Note (Signed)
s/p left hip surgery, s/p Kyphplasty, ambulates with walker.

## 2021-11-21 NOTE — Assessment & Plan Note (Signed)
stable, on MiraLax, Senokot S II bid.

## 2021-11-21 NOTE — Assessment & Plan Note (Signed)
Hgb a1c 5.7 04/25/21

## 2021-11-21 NOTE — Assessment & Plan Note (Signed)
takes Atorvastatin 5mg  qd, LDL 79 08/15/20

## 2021-11-21 NOTE — Assessment & Plan Note (Signed)
no active seizures since last seen, failed generic Tegretol in the past, on Tegretol 200mg  qd, Clonazepam hs (GDR to stop on her own didn't cause active seizures, but flare up insomnia and anxiety). S/p Neurology

## 2021-11-21 NOTE — Assessment & Plan Note (Addendum)
better, on Antivert prn, rarely used, MRI brain 06/25/21, 1. No acute finding, including infarct. 2. Generalized brain atrophy.

## 2021-11-22 ENCOUNTER — Encounter: Payer: Self-pay | Admitting: Nurse Practitioner

## 2021-11-27 ENCOUNTER — Telehealth: Payer: Self-pay

## 2021-11-27 ENCOUNTER — Other Ambulatory Visit: Payer: Self-pay | Admitting: Nurse Practitioner

## 2021-11-27 DIAGNOSIS — Z1231 Encounter for screening mammogram for malignant neoplasm of breast: Secondary | ICD-10-CM

## 2021-11-27 NOTE — Telephone Encounter (Signed)
Received a call from [facility] and they need to know if patient is to have a diagnostic mammogram or just a screening. Because based on diagnosis they need to know if she is having any problems with her breasts.  Message routed to Man Otho Darner, NP

## 2021-11-27 NOTE — Telephone Encounter (Signed)
Called [facility] and informed them that mammogram is screening mammogram.

## 2021-12-03 ENCOUNTER — Other Ambulatory Visit: Payer: Self-pay | Admitting: Nurse Practitioner

## 2021-12-03 DIAGNOSIS — Z23 Encounter for immunization: Secondary | ICD-10-CM | POA: Diagnosis not present

## 2021-12-19 DIAGNOSIS — Z23 Encounter for immunization: Secondary | ICD-10-CM | POA: Diagnosis not present

## 2021-12-30 ENCOUNTER — Other Ambulatory Visit: Payer: Self-pay | Admitting: Nurse Practitioner

## 2021-12-30 DIAGNOSIS — G40909 Epilepsy, unspecified, not intractable, without status epilepticus: Secondary | ICD-10-CM

## 2021-12-31 NOTE — Telephone Encounter (Signed)
High Risk Warning Populated when attempting to refill, I will send to Provider for further review

## 2022-02-03 ENCOUNTER — Other Ambulatory Visit: Payer: Self-pay | Admitting: Nurse Practitioner

## 2022-02-03 DIAGNOSIS — G40909 Epilepsy, unspecified, not intractable, without status epilepticus: Secondary | ICD-10-CM

## 2022-02-04 ENCOUNTER — Other Ambulatory Visit: Payer: Self-pay | Admitting: Nurse Practitioner

## 2022-02-04 DIAGNOSIS — G40909 Epilepsy, unspecified, not intractable, without status epilepticus: Secondary | ICD-10-CM

## 2022-02-05 ENCOUNTER — Telehealth: Payer: Medicare Other

## 2022-02-05 ENCOUNTER — Other Ambulatory Visit: Payer: Self-pay | Admitting: Nurse Practitioner

## 2022-02-05 MED ORDER — CARBAMAZEPINE 100 MG PO CHEW: 150.0000 mg | CHEWABLE_TABLET | Freq: Every day | ORAL | 1 refills | Status: DC

## 2022-02-05 MED ORDER — TEGRETOL 200 MG PO TABS: 300.0000 mg | ORAL_TABLET | Freq: Every day | ORAL | 2 refills | Status: DC

## 2022-02-05 MED ORDER — CARBAMAZEPINE 200 MG PO TABS: 300.0000 mg | ORAL_TABLET | Freq: Every day | ORAL | 2 refills | Status: DC

## 2022-02-05 NOTE — Telephone Encounter (Signed)
[facility] called stating that they received refill for tegratol 100mg  take 1 1/2 tablets. Patient has been on BRAND name tegratol 200 mg take 1/12 tablets according to pharmacy. What should patient be taking. A new prescription will need to be sent if patient is to take the 200 mg dose and written for brand name.  Message routed to Man X Mast, NP

## 2022-02-05 NOTE — Addendum Note (Signed)
Addended by: Maurice Small on: 02/05/2022 01:54 PM   Modules accepted: Orders

## 2022-02-19 ENCOUNTER — Encounter: Payer: Self-pay | Admitting: Nurse Practitioner

## 2022-02-20 ENCOUNTER — Encounter: Payer: Self-pay | Admitting: Nurse Practitioner

## 2022-02-20 ENCOUNTER — Non-Acute Institutional Stay: Payer: Medicare Other | Admitting: Nurse Practitioner

## 2022-02-20 VITALS — BP 118/70 | HR 94 | Temp 97.7°F | Ht 65.0 in | Wt 108.0 lb

## 2022-02-20 DIAGNOSIS — E785 Hyperlipidemia, unspecified: Secondary | ICD-10-CM | POA: Diagnosis not present

## 2022-02-20 DIAGNOSIS — R7303 Prediabetes: Secondary | ICD-10-CM

## 2022-02-20 DIAGNOSIS — M810 Age-related osteoporosis without current pathological fracture: Secondary | ICD-10-CM | POA: Diagnosis not present

## 2022-02-20 DIAGNOSIS — F419 Anxiety disorder, unspecified: Secondary | ICD-10-CM

## 2022-02-20 DIAGNOSIS — D500 Iron deficiency anemia secondary to blood loss (chronic): Secondary | ICD-10-CM

## 2022-02-20 DIAGNOSIS — K3189 Other diseases of stomach and duodenum: Secondary | ICD-10-CM

## 2022-02-20 DIAGNOSIS — M25552 Pain in left hip: Secondary | ICD-10-CM

## 2022-02-20 DIAGNOSIS — R42 Dizziness and giddiness: Secondary | ICD-10-CM | POA: Diagnosis not present

## 2022-02-20 DIAGNOSIS — G40822 Epileptic spasms, not intractable, without status epilepticus: Secondary | ICD-10-CM

## 2022-02-20 DIAGNOSIS — K5904 Chronic idiopathic constipation: Secondary | ICD-10-CM

## 2022-02-20 NOTE — Assessment & Plan Note (Signed)
Stable, on MiraLax, Senokot S II bid.

## 2022-02-20 NOTE — Assessment & Plan Note (Signed)
takes Clonazepam, failed GDR,  failed Ambien. TSH 3.2 12/21/20

## 2022-02-20 NOTE — Assessment & Plan Note (Signed)
off PPI

## 2022-02-20 NOTE — Assessment & Plan Note (Signed)
takes Reclast, t-score -3.6 09/13/19

## 2022-02-20 NOTE — Assessment & Plan Note (Signed)
Hgb 12.9 07/05/21

## 2022-02-20 NOTE — Assessment & Plan Note (Signed)
Hgb a1c 5.7 04/25/21, update Hgb A1c prior to the next appt

## 2022-02-20 NOTE — Assessment & Plan Note (Signed)
s/p left hip surgery, s/p Kyphplasty

## 2022-02-20 NOTE — Progress Notes (Signed)
Location:   Clinic [facility]   Place of Service:  Clinic (12) Provider: Marlana Latus NP  Code Status: DNR Goals of Care:     02/20/2022    8:01 AM  Advanced Directives  Does Patient Have a Medical Advance Directive? Yes  Type of Advance Directive Living will;Healthcare Power of Attorney  Does patient want to make changes to medical advance directive? No - Patient declined  Copy of [place] in Chart? Yes - validated most recent copy scanned in chart (See row information)     Chief Complaint  Patient presents with   Medical Management of Chronic Issues    3 month follow-up. Discuss need for A1c and covid boosters or post pone if patient refuses or is not a candidate. [facility] verified.     HPI: Patient is a 82 y.o. female seen today for medical management of chronic diseases.     OA, s/p left hip surgery, s/p Kyphplasty             Dizziness: better, MRI brain 06/25/21, 1. No acute finding, including infarct. 2. Generalized brain atrophy.              Post op anemia, Hgb 12.9 07/05/21             Seizures, no active seizures since last seen, failed generic Tegretol in the past, on Tegretol 237m qd, Clonazepam hs (GDR to stop on her own didn't cause active seizures, but flare up insomnia and anxiety). S/p Neurology              Constipation, stable, on MiraLax, Senokot S II bid.              Hyperlipidemia, takes Atorvastatin 545mqd, LDL 79 08/15/20             Prediabetic, Hgb a1c 5.7 04/25/21             OP takes Reclast, t-score -3.6 09/13/19             Insomnia, takes Clonazepam, failed GDR,  failed Ambien. TSH 3.2 12/21/20             Erosive gastropathy, off PPI   Past Medical History:  Diagnosis Date   Anxiety    Cataract    Chronic constipation    Depression    Osteoporosis    [place]   [place]  [place] 1977    Past Surgical History:  Procedure Laterality Date   ABDOMINAL HYSTERECTOMY  1995   Dr. VaEdwyna Shell CATARACT EXTRACTION Bilateral  12/2016   COLONOSCOPY  2012   patchy increased intraepithelial lymphocytes - draelos   ESOPHAGOGASTRODUODENOSCOPY     multiple   FISSURECTOMY     INTRAMEDULLARY (IM) NAIL INTERTROCHANTERIC Left 05/09/2021   Procedure: INTRAMEDULLARY (IM) NAIL INTERTROCHANTRIC;  Surgeon: DeMeredith PelMD;  [facility]: [facility];  Laterality: Left;   IR KYPHO EA ADDL LEVEL THORACIC OR LUMBAR  06/26/2021   IR KYPHO THORACIC WITH BONE BIOPSY  06/26/2021   WRIST SURGERY Left    due to fracture    Allergies  Allergen Reactions   Cat Hair Extract Other (See Comments)    Patient states that it causes her eyes to swell shut   Cefaclor Other (See Comments)    Unknown reaction   Codeine Nausea Only   Fish Allergy Nausea Only   Iodinated Contrast Media Other (See Comments)    Unknown reaction  Other Other (See Comments)    Seeds -- Reflux   Penicillins Itching   Percocet [Oxycodone-Acetaminophen] Nausea And Vomiting   Red Dye Other (See Comments)    Reflux   Senna-Docusate Sodium [Sennosides-Docusate Sodium] Other (See Comments)    Severe stomach pains   Shrimp (Diagnostic) Nausea Only    Stomach pain    Tomato Other (See Comments)    Reflux   Hm Lidocaine Patch [Lidocaine] Rash    Allergies as of 02/20/2022       Reactions   Cat Hair Extract Other (See Comments)   Patient states that it causes her eyes to swell shut   Cefaclor Other (See Comments)   Unknown reaction   Codeine Nausea Only   Fish Allergy Nausea Only   Iodinated Contrast Media Other (See Comments)   Unknown reaction   Other Other (See Comments)   Seeds -- Reflux   Penicillins Itching   Percocet [oxycodone-acetaminophen] Nausea And Vomiting   Red Dye Other (See Comments)   Reflux   Senna-docusate Sodium [sennosides-docusate Sodium] Other (See Comments)   Severe stomach pains   Shrimp (diagnostic) Nausea Only   Stomach pain   Tomato Other (See Comments)   Reflux   Hm Lidocaine Patch [lidocaine]  Rash        Medication List        Accurate as of February 20, 2022  4:44 PM. If you have any questions, ask your nurse or doctor.          STOP taking these medications    cetirizine 10 MG tablet Commonly known as: ZYRTEC Stopped by: Liliana Brentlinger X Brayten Komar, NP   hydrocortisone cream 1 % Stopped by: Xiong Haidar X Jakhia Buxton, NP   senna-docusate 8.6-50 MG tablet Commonly known as: Senokot-S Stopped by: Sollie Vultaggio X Britten Parady, NP       TAKE these medications    acetaminophen 500 MG tablet Commonly known as: TYLENOL Take 1,000 mg by mouth 2 (two) times a week.   atorvastatin 10 MG tablet Commonly known as: LIPITOR Take by mouth daily. 1/2 tablet   BIOFREEZE EX Apply topically.   Caltrate 600+D Plus Minerals 600-800 MG-UNIT Chew Chew 2 each by mouth daily.   clonazePAM 1 MG tablet Commonly known as: KLONOPIN Take 1 tablet (1 mg total) by mouth at bedtime.   ENSURE MAX PROTEIN PO Take 1 Bottle by mouth daily.   meclizine 25 MG tablet Commonly known as: ANTIVERT Take 0.5 tablets (12.5 mg total) by mouth 3 (three) times daily as needed for dizziness. What changed: Another medication with the same name was removed. Continue taking this medication, and follow the directions you see here. Changed by: Marypat Kimmet X Aviyon Hocevar, NP   polyethylene glycol powder 17 GM/SCOOP powder Commonly known as: GLYCOLAX/MIRALAX Take 17 g by mouth daily.   PRESERVISION AREDS PO Take 1 tablet by mouth 2 (two) times daily.   Soothe XP Soln Place 1 drop into both eyes 4 (four) times daily.   TEGretol 200 MG tablet Generic drug: carbamazepine Take 1.5 tablets (300 mg total) by mouth at bedtime.   Vitamin D (Ergocalciferol) 1.25 MG (50000 UNIT) Caps capsule Commonly known as: DRISDOL TAKE ONE CAPSULE BY MOUTH ONCE A WEEK        Review of Systems:  Review of Systems  Constitutional:  Negative for appetite change, fatigue and fever.  HENT:  Positive for hearing loss. Negative for congestion and voice change.   Eyes:   Negative for visual disturbance.  Respiratory:  Negative  for shortness of breath.   Cardiovascular:  Negative for leg swelling.  Gastrointestinal:  Negative for abdominal pain, constipation, diarrhea, nausea and vomiting.  Genitourinary:  Positive for frequency. Negative for dysuria and urgency.       The patient stated she pushes her suprapubic region to help emptying her bladder  Musculoskeletal:  Positive for arthralgias, back pain and gait problem.       Left hip pain when walking, better.   Skin:  Negative for color change.       Left hip surgical incision is healed.   Neurological:  Positive for numbness. Negative for seizures and weakness.       Tingling/numbness left foot sometimes. Occasionally left hip/leg pain sometimes. Lightheaded when not sleeping well in lifetime.   Psychiatric/Behavioral:  Positive for sleep disturbance. Negative for behavioral problems. The patient is not nervous/anxious.        Feels lightheaded if not sleep well at night.    Health Maintenance  Topic Date Due   MAMMOGRAM  08/13/2017   Diabetic kidney evaluation - Urine ACR  04/19/2021   COVID-19 Vaccine (7 - 2023-24 season) 10/18/2021   HEMOGLOBIN A1C  10/26/2021   FOOT EXAM  04/11/2022   OPHTHALMOLOGY EXAM  05/07/2022   Diabetic kidney evaluation - eGFR measurement  07/06/2022   Medicare Annual Wellness (AWV)  10/30/2022   DTaP/Tdap/Td (2 - Td or Tdap) 08/21/2027   Pneumonia Vaccine 49+ Years old  Completed   INFLUENZA VACCINE  Completed   DEXA SCAN  Completed   Zoster Vaccines- Shingrix  Completed   HPV VACCINES  Aged Out    Physical Exam: Vitals:   02/19/22 1406  BP: 118/70  Pulse: 94  Temp: 97.7 F (36.5 C)  TempSrc: Temporal  SpO2: 98%  Weight: 108 lb (49 kg)  Height: _0  (1.651 m)   Body mass index is 17.97 kg/m. Physical Exam Vitals and nursing note reviewed.  Constitutional:      Appearance: Normal appearance.  HENT:     Head: Normocephalic and atraumatic.      Mouth/Throat:     Mouth: Mucous membranes are moist.  Eyes:     Extraocular Movements: Extraocular movements intact.     Conjunctiva/sclera: Conjunctivae normal.     Pupils: Pupils are equal, round, and reactive to light.  Cardiovascular:     Rate and Rhythm: Normal rate and regular rhythm.     Heart sounds: No murmur heard. Pulmonary:     Effort: Pulmonary effort is normal.     Breath sounds: No rales.  Abdominal:     General: Bowel sounds are normal.     Palpations: Abdomen is soft.     Tenderness: There is no abdominal tenderness.  Musculoskeletal:     Cervical back: Normal range of motion and neck supple.     Right lower leg: No edema.     Left lower leg: No edema.     Comments: S/p left hip ORIF.   Skin:    General: Skin is warm and dry.     Comments: Left hip surgical scar  Neurological:     General: No focal deficit present.     Mental Status: She is alert and oriented to person, place, and time. Mental status is at baseline.     Gait: Gait abnormal.     Comments: Ambulates with walker.   Psychiatric:        Mood and Affect: Mood normal.        Behavior:  Behavior normal.        Thought Content: Thought content normal.        Judgment: Judgment normal.     Labs reviewed: Basic Metabolic Panel: Recent Labs    06/21/21 1145 06/22/21 0312 06/26/21 0056 07/05/21 0000  NA 130* 133* 130* 136*  K 4.2 3.7 4.0 4.3  CL 94* 100 97* 97*  CO2 _0 27*  GLUCOSE 170* 155* 186*  --   BUN _1 CREATININE 0.60 0.52 0.54 0.5  CALCIUM 9.4 8.7* 9.2 8.9   Liver Function Tests: Recent Labs    05/16/21 0000 06/21/21 1145  AST 28 30  ALT 30 32  ALKPHOS 47 99  BILITOT  --  0.5  PROT  --  6.6  ALBUMIN 3.3* 4.3   No results for input(s): "LIPASE", "AMYLASE" in the last 8760 hours. No results for input(s): "AMMONIA" in the last 8760 hours. CBC: Recent Labs    05/12/21 0506 05/16/21 0000 06/21/21 1145 06/26/21 0056 07/05/21 0000  WBC 7.6   < > 10.0 6.1  4.9  NEUTROABS  --    < > 8.3* 3.9 2,685.00  HGB 9.4*   < > 13.0 13.1 12.9  HCT 29.2*   < > 39.7 38.2 39  MCV 99.7  --  97.3 96.5  --   PLT 152   < > 215 194 300   < > = values in this interval not displayed.   Lipid Panel: No results for input(s): "CHOL", "HDL", "LDLCALC", "TRIG", "CHOLHDL", "LDLDIRECT" in the last 8760 hours. Lab Results  Component Value Date   HGBA1C 5.7 (H) 04/25/2021    Procedures since last visit: No results found.  Assessment/Plan  Pre-diabetes Hgb a1c 5.7 04/25/21, update Hgb A1c prior to the next appt  Osteoporosis takes Reclast, t-score -3.6 09/13/19  Anxiety takes Clonazepam, failed GDR,  failed Ambien. TSH 3.2 12/21/20  Erosive gastropathy off PPI  Hyperlipidemia takes Atorvastatin 69m qd, LDL 79 08/15/20, update Lipid panel.  Constipation Stable, on MiraLax, Senokot S II bid.   Epilepsy without status epilepticus, not intractable [place] no active [place] since last seen, failed generic Tegretol in the past, on Tegretol 201mqd, Clonazepam hs (GDR to stop on her own didn't cause active seizures, but flare up insomnia and anxiety). S/p Neurology  CBC/diff, CMP/eGFR prior to the next appt  Blood loss anemia Hgb 12.9 07/05/21  Vertigo better, MRI brain 06/25/21, 1. No acute finding, including infarct. 2. Generalized brain atroph  Left hip pain s/p left hip surgery, s/p Kyphplasty   Labs/tests ordered: CBC/diff, CMP/eGFR, TSH, lipid panel, Hgb A1c prior to the next appt.   Next appt:  4 months

## 2022-02-20 NOTE — Assessment & Plan Note (Signed)
takes Atorvastatin 5mg  qd, LDL 79 08/15/20, update Lipid panel.

## 2022-02-20 NOTE — Assessment & Plan Note (Signed)
no active seizures since last seen, failed generic Tegretol in the past, on Tegretol 263m qd, Clonazepam hs (GDR to stop on her own didn't cause active seizures, but flare up insomnia and anxiety). S/p Neurology  CBC/diff, CMP/eGFR prior to the next appt

## 2022-02-20 NOTE — Assessment & Plan Note (Signed)
better, MRI brain 06/25/21, 1. No acute finding, including infarct. 2. Generalized brain atroph

## 2022-04-02 ENCOUNTER — Other Ambulatory Visit: Payer: Self-pay | Admitting: Nurse Practitioner

## 2022-04-02 DIAGNOSIS — E785 Hyperlipidemia, unspecified: Secondary | ICD-10-CM

## 2022-04-23 NOTE — Progress Notes (Signed)
[facility] Note  05/05/2022     CHIEF COMPLAINT Patient presents for Retina Follow Up  HISTORY OF PRESENT ILLNESS: Erin Ochoa is a 82 y.o. female who presents to the clinic today for:   HPI     Retina Follow Up   Patient presents with  Dry AMD.  In both eyes.  Severity is moderate.  Duration of 6 months.  Since onset it is stable.  I, the attending physician,  performed the HPI with the patient and updated documentation appropriately.        Comments   Pt here for 6 mo f/u non exu ARMD OU. Pt states VA is about the same, no changes shes noted. Pt using Sotthe Tears XP TID OU and Systane or GenTeal Tears QHS OU.       Last edited by Bernarda Caffey, MD on 05/07/2022 10:06 PM.    Pt states vision is stable, doesn't feel like eyes are dry, she uses Soothe XP during the day and either Systane or Genteal gel at bedtime  Referring physician: Monna Fam, MD [place],  [place] [address]  HISTORICAL INFORMATION:   Selected notes from the MEDICAL RECORD NUMBER Referred by Dr. Herbert Deaner for progressing ARMD  LEE:  Ocular Hx- PMH-    CURRENT MEDICATIONS: Current Outpatient Medications (Ophthalmic Drugs)  Medication Sig   Artificial Tear Solution (SOOTHE XP) SOLN Place 1 drop into both eyes 4 (four) times daily.   No current facility-administered medications for this visit. (Ophthalmic Drugs)   Current Outpatient Medications (Other)  Medication Sig   acetaminophen (TYLENOL) 500 MG tablet Take 1,000 mg by mouth 2 (two) times a week.   atorvastatin (LIPITOR) 10 MG tablet TAKE (1/2) TABLET DAILY.   Calcium Carbonate-Vit D-Min (CALTRATE 600+D PLUS MINERALS) 600-800 MG-UNIT CHEW Chew 2 each by mouth daily.   clonazePAM (KLONOPIN) 1 MG tablet Take 1 tablet (1 mg total) by mouth at bedtime.   meclizine (ANTIVERT) 25 MG tablet Take 0.5 tablets (12.5 mg total) by mouth 3 (three) times daily as needed for dizziness.   Menthol, Topical  Analgesic, (BIOFREEZE EX) Apply topically.   Multiple Vitamins-Minerals (PRESERVISION AREDS PO) Take 1 tablet by mouth 2 (two) times daily.   Nutritional Supplements (ENSURE MAX PROTEIN PO) Take 1 Bottle by mouth daily.   polyethylene glycol powder (GLYCOLAX/MIRALAX) 17 GM/SCOOP powder Take 17 g by mouth daily.   TEGRETOL 200 MG tablet Take 1.5 tablets (300 mg total) by mouth at bedtime.   Vitamin D, Ergocalciferol, (DRISDOL) 1.25 MG (50000 UNIT) CAPS capsule TAKE ONE CAPSULE BY MOUTH ONCE A WEEK   No current facility-administered medications for this visit. (Other)   REVIEW OF SYSTEMS: ROS   Positive for: Eyes Negative for: Constitutional, Gastrointestinal, Neurological, Skin, Genitourinary, Musculoskeletal, HENT, Endocrine, Cardiovascular, Respiratory, Psychiatric, Allergic/Imm, Heme/Lymph Last edited by Kingsley Spittle, COT on 05/05/2022  1:44 PM.     ALLERGIES Allergies  Allergen Reactions   Cat Hair Extract Other (See Comments)    Patient states that it causes her eyes to swell shut   Cefaclor Other (See Comments)    Unknown reaction   Codeine Nausea Only   Fish Allergy Nausea Only   Iodinated Contrast Media Other (See Comments)    Unknown reaction     Other Other (See Comments)    Seeds -- Reflux   Penicillins Itching   Percocet [Oxycodone-Acetaminophen] Nausea And Vomiting   Red Dye Other (See Comments)  Reflux   Senna-Docusate Sodium [Sennosides-Docusate Sodium] Other (See Comments)    Severe stomach pains   Shrimp (Diagnostic) Nausea Only    Stomach pain    Tomato Other (See Comments)    Reflux   Hm Lidocaine Patch [Lidocaine] Rash   PAST MEDICAL HISTORY Past Medical History:  Diagnosis Date   Anxiety    Cataract    Chronic constipation    Depression    Osteoporosis    Seizures ([place])    epilepsy  last seizure 1977   Past Surgical History:  Procedure Laterality Date   ABDOMINAL HYSTERECTOMY  1995   Dr. Edwyna Shell   CATARACT EXTRACTION  Bilateral 12/2016   COLONOSCOPY  2012   patchy increased intraepithelial lymphocytes - draelos   ESOPHAGOGASTRODUODENOSCOPY     multiple   FISSURECTOMY     INTRAMEDULLARY (IM) NAIL INTERTROCHANTERIC Left 05/09/2021   Procedure: INTRAMEDULLARY (IM) NAIL INTERTROCHANTRIC;  Surgeon: Meredith Pel, MD;  Location: [facility];  Service: Orthopedics;  Laterality: Left;   IR KYPHO EA ADDL LEVEL THORACIC OR LUMBAR  06/26/2021   IR KYPHO THORACIC WITH BONE BIOPSY  06/26/2021   WRIST SURGERY Left    due to fracture   FAMILY HISTORY Family History  Problem Relation Age of Onset   Alcohol abuse Father    Diabetes Father    Diabetes Maternal Grandmother    Diabetes Paternal Grandmother    Colon cancer Maternal Grandfather    Esophageal cancer Neg Hx    Rectal cancer Neg Hx    Stomach cancer Neg Hx    SOCIAL HISTORY Social History   Tobacco Use   Smoking status: Former    Packs/day: 1    Types: Cigarettes    Start date: 02/18/1956    Quit date: 02/17/1981    Years since quitting: 41.2   Smokeless tobacco: Never  Vaping Use   Vaping Use: Never used  Substance Use Topics   Alcohol use: No    Alcohol/week: 0.0 standard drinks of alcohol   Drug use: No       OPHTHALMIC EXAM: Base Eye Exam     Visual Acuity (Snellen - Linear)       Right Left   Dist cc 20/25 20/30 -2   Dist ph cc 20/25 +2 20/30    Correction: Glasses         Tonometry (Tonopen, 1:55 PM)       Right Left   Pressure 15 13         Pupils       Pupils Dark Light Shape React APD   Right PERRL 3 2 Round Brisk None   Left PERRL 3 2 Round Brisk None         Visual Fields (Counting fingers)       Left Right    Full Full         Extraocular Movement       Right Left    Full, Ortho Full, Ortho         Neuro/Psych     Oriented x3: Yes   Mood/Affect: Normal         Dilation     Both eyes: 1.0% Mydriacyl, 2.5% Phenylephrine @ 1:55 PM           Slit Lamp and Fundus Exam     Slit  Lamp Exam       Right Left   Lids/Lashes Dermatochalasis - upper lid, mild MGD Dermatochalasis - upper lid, mild MGD  Conjunctiva/Sclera White and quiet White and quiet   Cornea mild arcus, 3+ Punctate epithelial erosions with irregular epi, mild EBMD 2-3+ Punctate epithelial erosions with irregular epi inferiorly, arcus   Anterior Chamber deep and clear deep and clear   Iris Round and dilated Round and dilated   Lens PC IOL in good position PC IOL in good position, 1+ Posterior capsular opacification   Anterior Vitreous syneresis Vitreous syneresis, Posterior vitreous detachment, vitreous condensations         Fundus Exam       Right Left   Disc Pink and Sharp Pink and Sharp   C/D Ratio 0.6 0.6   Macula Flat, Blunted foveal reflex, Drusen, RPE mottling, +PED, No heme or edema Flat, Blunted foveal reflex, central PEDs, Drusen, RPE mottling and clumping, No heme or edema   Vessels attenuated, mild tortuosity mild attenuation, mild tortuosity   Periphery Attached, mild reticular degeneration, No heme Attached, mild reticular degeneration, No heme           Refraction     Wearing Rx       Sphere Cylinder Axis Add   Right -1.75 +1.25 002 +2.50   Left -1.00 +1.75 172 +2.50    Type: PAL         Manifest Refraction       Sphere Cylinder Axis Dist VA   Right       Left -1.00 +2.00 165 20/30-1           IMAGING AND PROCEDURES  Imaging and Procedures for 05/05/2022  OCT, Retina - OU - Both Eyes       Right Eye Quality was good. Central Foveal Thickness: 310. Progression has been stable. Findings include normal foveal contour, no IRF, no SRF, retinal drusen , pigment epithelial detachment.   Left Eye Quality was good. Central Foveal Thickness: 327. Progression has been stable. Findings include normal foveal contour, no IRF, no SRF, retinal drusen , pigment epithelial detachment (Prominent central PEDs ).   Notes *Images captured and stored on drive  Diagnosis /  Impression:  Non-exu ARMD OU - prominent PEDs OU -- stable  Clinical management:  See below  Abbreviations: NFP - Normal foveal profile. CME - cystoid macular edema. PED - pigment epithelial detachment. IRF - intraretinal fluid. SRF - subretinal fluid. EZ - ellipsoid zone. ERM - epiretinal membrane. ORA - outer retinal atrophy. ORT - outer retinal tubulation. SRHM - subretinal hyper-reflective material. IRHM - intraretinal hyper-reflective material            ASSESSMENT/PLAN:    ICD-10-CM   1. Intermediate stage nonexudative age-related macular degeneration of both eyes  H35.3132 OCT, Retina - OU - Both Eyes    2. Pseudophakia, both eyes  Z96.1     3. Dry eyes  H04.123      1. Age related macular degeneration, non-exudative, both eyes  - intermediate stage with prominent PEDs OU (OS > OD) -- stable  - OCT without significant change from prior  - Recommend amsler grid monitoring  - f/u 6-9 months, DFE, OCT  2. Pseudophakia OU  - s/p CE/IOL (Dr. Herbert Deaner)  - IOLs in good position, doing well  - monitor  3. Dry eyes OU - recommend artificial tears and lubricating ointment as needed -- pt is using Soothe XP TID and Systane / Genteal gel QHS OU - will increase Soothe to q2h - will refer to Dr. Patrice Paradise for evaluation  Ophthalmic Meds Ordered this visit:  No orders of  the defined types were placed in this encounter.    Return for f/u 6-9 months, non-exu ARMD OU, DFE, OCT.  There are no Patient Instructions on file for this visit.  Explained the diagnoses, plan, and follow up with the patient and they expressed understanding.  Patient expressed understanding of the importance of proper follow up care.   This document serves as a record of services personally performed by Gardiner Sleeper, MD, PhD. It was created on their behalf by Orvan Falconer, an ophthalmic technician. The creation of this record is the provider's dictation and/or activities during the visit.     Electronically signed by: Orvan Falconer, OA, 05/07/22  10:08 PM  Gardiner Sleeper, M.D., Ph.D. Diseases & Surgery of the Retina and Vitreous [facility]  I have reviewed the above documentation for accuracy and completeness, and I agree with the above. Gardiner Sleeper, M.D., Ph.D. 05/07/22 10:10 PM   Abbreviations: M myopia (nearsighted); A astigmatism; H hyperopia (farsighted); P presbyopia; Mrx spectacle prescription;  CTL contact lenses; OD right eye; OS left eye; OU both eyes  XT exotropia; ET esotropia; PEK punctate epithelial keratitis; PEE punctate epithelial erosions; DES dry eye syndrome; MGD meibomian gland dysfunction; ATs artificial tears; PFAT's preservative free artificial tears; [place] nuclear sclerotic cataract; PSC posterior subcapsular cataract; ERM epi-retinal membrane; PVD posterior vitreous detachment; RD retinal detachment; DM diabetes mellitus; DR diabetic retinopathy; NPDR non-proliferative diabetic retinopathy; PDR proliferative diabetic retinopathy; CSME clinically significant macular edema; DME diabetic macular edema; dbh dot blot hemorrhages; CWS cotton wool spot; POAG primary open angle glaucoma; C/D cup-to-disc ratio; HVF humphrey visual field; GVF goldmann visual field; OCT optical coherence tomography; IOP intraocular pressure; BRVO Branch retinal vein occlusion; CRVO central retinal vein occlusion; CRAO central retinal artery occlusion; BRAO branch retinal artery occlusion; RT retinal tear; SB scleral buckle; PPV pars plana vitrectomy; VH Vitreous hemorrhage; PRP panretinal laser photocoagulation; IVK intravitreal kenalog; VMT vitreomacular traction; MH Macular hole;  NVD neovascularization of the disc; NVE neovascularization elsewhere; AREDS age related eye disease study; ARMD age related macular degeneration; POAG primary open angle glaucoma; EBMD epithelial/anterior basement membrane dystrophy; ACIOL anterior chamber intraocular lens; IOL  intraocular lens; PCIOL posterior chamber intraocular lens; Phaco/IOL phacoemulsification with intraocular lens placement; [place] photorefractive keratectomy; LASIK laser assisted in situ keratomileusis; HTN hypertension; DM diabetes mellitus; COPD chronic obstructive pulmonary disease

## 2022-05-05 ENCOUNTER — Encounter: Payer: Self-pay | Admitting: Nurse Practitioner

## 2022-05-05 ENCOUNTER — Encounter (INDEPENDENT_AMBULATORY_CARE_PROVIDER_SITE_OTHER): Payer: Self-pay | Admitting: Ophthalmology

## 2022-05-05 ENCOUNTER — Ambulatory Visit (INDEPENDENT_AMBULATORY_CARE_PROVIDER_SITE_OTHER): Payer: Medicare Other | Admitting: Ophthalmology

## 2022-05-05 DIAGNOSIS — H04123 Dry eye syndrome of bilateral lacrimal glands: Secondary | ICD-10-CM

## 2022-05-05 DIAGNOSIS — H353132 Nonexudative age-related macular degeneration, bilateral, intermediate dry stage: Secondary | ICD-10-CM | POA: Diagnosis not present

## 2022-05-05 DIAGNOSIS — Z961 Presence of intraocular lens: Secondary | ICD-10-CM | POA: Diagnosis not present

## 2022-05-07 ENCOUNTER — Encounter (INDEPENDENT_AMBULATORY_CARE_PROVIDER_SITE_OTHER): Payer: Self-pay | Admitting: Ophthalmology

## 2022-05-12 ENCOUNTER — Ambulatory Visit
Admission: RE | Admit: 2022-05-12 | Discharge: 2022-05-12 | Disposition: A | Discharge status: Home / Self Care | Payer: Medicare Other | Source: Ambulatory Visit | Attending: Nurse Practitioner | Admitting: Nurse Practitioner

## 2022-05-12 DIAGNOSIS — Z780 Asymptomatic menopausal state: Secondary | ICD-10-CM | POA: Diagnosis not present

## 2022-05-12 DIAGNOSIS — M810 Age-related osteoporosis without current pathological fracture: Secondary | ICD-10-CM

## 2022-05-12 DIAGNOSIS — Z1231 Encounter for screening mammogram for malignant neoplasm of breast: Secondary | ICD-10-CM | POA: Diagnosis not present

## 2022-05-22 ENCOUNTER — Other Ambulatory Visit: Payer: Self-pay | Admitting: Nurse Practitioner

## 2022-05-22 DIAGNOSIS — G4700 Insomnia, unspecified: Secondary | ICD-10-CM

## 2022-05-27 ENCOUNTER — Other Ambulatory Visit: Payer: Self-pay

## 2022-05-27 DIAGNOSIS — K3189 Other diseases of stomach and duodenum: Secondary | ICD-10-CM

## 2022-05-27 DIAGNOSIS — R7303 Prediabetes: Secondary | ICD-10-CM

## 2022-05-27 DIAGNOSIS — E785 Hyperlipidemia, unspecified: Secondary | ICD-10-CM

## 2022-05-30 ENCOUNTER — Telehealth: Payer: Self-pay | Admitting: *Deleted

## 2022-05-30 NOTE — Telephone Encounter (Signed)
Rosey Bath called back and stated that it is on patient's Tegretol.   Trying to initiate Prior Authorization through Cover My Meds. Called patient to confirm dosage of Tegretol, LMOM for patient to return call.   chart note states that patient is on Tegretol 200mg  daily but the Current Medication list stated that patient is taking 300mg  By mouth daily. LMOM for patient to return call to clarify.  Awaiting callback.

## 2022-05-30 NOTE — Telephone Encounter (Signed)
Rosey Bath, Nurse with [facility] called and Left Message on Clinical intake and stated that patient has been waiting on a Prior Authorization since Monday on one of her medications.  I didn't understand on Voicemail which medication she was referring to and there is no documentation in patient's chart.   Called Teresa back and [facility] to return call regarding what medication she is referring to.  Awaiting callback.

## 2022-05-30 NOTE — Telephone Encounter (Signed)
Patient stated that she takes Tegretol 200 One and Half (300mg ) daily. Has to take [facility].   Initiated Prior Authorization through [employer].  OIZ:[id number]  Went into Determination, to allow 48-72 hours.

## 2022-06-03 ENCOUNTER — Other Ambulatory Visit: Payer: Medicare Other

## 2022-06-03 ENCOUNTER — Encounter: Payer: Self-pay | Admitting: Nurse Practitioner

## 2022-06-03 DIAGNOSIS — R7303 Prediabetes: Secondary | ICD-10-CM | POA: Diagnosis not present

## 2022-06-03 DIAGNOSIS — K3189 Other diseases of stomach and duodenum: Secondary | ICD-10-CM | POA: Diagnosis not present

## 2022-06-03 DIAGNOSIS — E785 Hyperlipidemia, unspecified: Secondary | ICD-10-CM | POA: Diagnosis not present

## 2022-06-03 LAB — CBC WITH DIFFERENTIAL/PLATELET
Basophils Absolute: 60 cells/uL (ref 0–200)
Eosinophils Relative: 5.5 %
Hemoglobin: 13.3 g/dL (ref 11.7–15.5)
Lymphs Abs: 1141 cells/uL (ref 850–3900)
MCH: 31.4 pg (ref 27.0–33.0)
RDW: 12.4 % (ref 11.0–15.0)
Total Lymphocyte: 24.8 %

## 2022-06-03 LAB — LIPID PANEL: LDL Cholesterol (Calc): 81 mg/dL (calc)

## 2022-06-03 NOTE — Progress Notes (Signed)
This encounter was created in error - please disregard.

## 2022-06-04 LAB — LIPID PANEL
Cholesterol: 153 mg/dL (ref <200)
HDL: 51 mg/dL (ref >=50)
Non-HDL Cholesterol (Calc): 102 mg/dL (calc) (ref <130)
Total CHOL/HDL Ratio: 3 (calc) (ref <5.0)
Triglycerides: 112 mg/dL (ref <150)

## 2022-06-04 LAB — CBC WITH DIFFERENTIAL/PLATELET
Absolute Monocytes: 575 cells/uL (ref 200–950)
Basophils Relative: 1.3 %
Eosinophils Absolute: 253 cells/uL (ref 15–500)
HCT: 40.9 % (ref 35.0–45.0)
MCHC: 32.5 g/dL (ref 32.0–36.0)
MCV: 96.5 fL (ref 80.0–100.0)
MPV: 11.1 fL (ref 7.5–12.5)
Monocytes Relative: 12.5 %
Neutro Abs: 2571 cells/uL (ref 1500–7800)
Neutrophils Relative %: 55.9 %
Platelets: 227 10*3/uL (ref 140–400)
RBC: 4.24 10*6/uL (ref 3.80–5.10)
WBC: 4.6 10*3/uL (ref 3.8–10.8)

## 2022-06-04 LAB — COMPLETE METABOLIC PANEL WITH GFR
AG Ratio: 1.7 (calc) (ref 1.0–2.5)
ALT: 20 U/L (ref 6–29)
AST: 26 U/L (ref 10–35)
Albumin: 4 g/dL (ref 3.6–5.1)
Alkaline phosphatase (APISO): 54 U/L (ref 37–153)
BUN: 18 mg/dL (ref 7–25)
CO2: 29 mmol/L (ref 20–32)
Calcium: 9.3 mg/dL (ref 8.6–10.4)
Chloride: 106 mmol/L (ref 98–110)
Creat: 0.63 mg/dL (ref 0.60–0.95)
Globulin: 2.3 g/dL (calc) (ref 1.9–3.7)
Glucose, Bld: 111 mg/dL — ABNORMAL HIGH (ref 65–99)
Potassium: 4 mmol/L (ref 3.5–5.3)
Sodium: 144 mmol/L (ref 135–146)
Total Bilirubin: 0.6 mg/dL (ref 0.2–1.2)
Total Protein: 6.3 g/dL (ref 6.1–8.1)
eGFR: 89 mL/min/{1.73_m2} (ref >=60)

## 2022-06-04 LAB — HEMOGLOBIN A1C
Hgb A1c MFr Bld: 6.2 % of total Hgb — ABNORMAL HIGH (ref <5.7)
Mean Plasma Glucose: 131 mg/dL
eAG (mmol/L): 7.3 mmol/L

## 2022-06-04 LAB — TSH: TSH: 3.4 mIU/L (ref 0.40–4.50)

## 2022-06-05 ENCOUNTER — Encounter: Payer: Self-pay | Admitting: Nurse Practitioner

## 2022-06-05 ENCOUNTER — Non-Acute Institutional Stay: Payer: Medicare Other | Admitting: Nurse Practitioner

## 2022-06-05 VITALS — BP 100/58 | HR 88 | Temp 97.4°F | Ht 65.0 in | Wt 106.0 lb

## 2022-06-05 DIAGNOSIS — M810 Age-related osteoporosis without current pathological fracture: Secondary | ICD-10-CM | POA: Diagnosis not present

## 2022-06-05 DIAGNOSIS — E785 Hyperlipidemia, unspecified: Secondary | ICD-10-CM

## 2022-06-05 DIAGNOSIS — M1990 Unspecified osteoarthritis, unspecified site: Secondary | ICD-10-CM | POA: Diagnosis not present

## 2022-06-05 DIAGNOSIS — R7303 Prediabetes: Secondary | ICD-10-CM | POA: Diagnosis not present

## 2022-06-05 DIAGNOSIS — K5904 Chronic idiopathic constipation: Secondary | ICD-10-CM

## 2022-06-05 DIAGNOSIS — G40822 Epileptic spasms, not intractable, without status epilepticus: Secondary | ICD-10-CM

## 2022-06-05 DIAGNOSIS — F5101 Primary insomnia: Secondary | ICD-10-CM | POA: Diagnosis not present

## 2022-06-05 NOTE — Assessment & Plan Note (Addendum)
s/p left hip surgery, s/p Kyphplasty, ambulate independently

## 2022-06-05 NOTE — Assessment & Plan Note (Signed)
takes Atorvastatin  qd, LDL 81 06/13/22

## 2022-06-05 NOTE — Assessment & Plan Note (Signed)
stable, on MiraLax, Senokot S II bid.  

## 2022-06-05 NOTE — Progress Notes (Signed)
Location:   Clinic [facility]   Place of Service:  Clinic (12) Provider: Chipper Oman NP  Code Status: DNR Goals of Care:     02/20/2022    8:01 AM  Advanced Directives  Does Patient Have a Medical Advance Directive? Yes  Type of Advance Directive Living will;Healthcare Power of Attorney  Does patient want to make changes to medical advance directive? No - Patient declined  Copy of Healthcare Power of Attorney in Chart? Yes - validated most recent copy scanned in chart (See row information)     Chief Complaint  Patient presents with   Medical Management of Chronic Issues    Patient presents today for a 3 month follow-up   Quality Metric Gaps    Eye & foot exam, urine microalbumin, COVID#7    HPI: Patient is a 82 y.o. female seen today for medical management of chronic diseases.      OA, s/p left hip surgery, s/p Kyphplasty             Dizziness: better, MRI brain 06/25/21, 1. No acute finding, including infarct. 2. Generalized brain atrophy.              Post op anemia, Hgb 12.5 06/03/22             Seizures, no active seizures since last seen, failed generic Tegretol in the past, on Tegretol 200mg  qd, Clonazepam hs (GDR to stop on her own didn't cause active seizures, but flare up insomnia and anxiety). S/p Neurology              Constipation, stable, on MiraLax, Senokot S II bid.              Hyperlipidemia, takes Atorvastatin 5mg  qd, LDL 81 06/13/22             Prediabetic, Hgb a1c 5.7 04/25/21<<6.2 06/03/22, lifestyle modification             OP takes Reclast, t-score -3.6 09/13/19, -3.5 05/12/22             Insomnia, takes Clonazepam, failed GDR,  failed Ambien. TSH 3.4 06/03/22             Erosive gastropathy, off PPI   Past Medical History:  Diagnosis Date   Anxiety    Cataract    Chronic constipation    Depression    Osteoporosis    Seizures    epilepsy  last seizure 1977    Past Surgical History:  Procedure Laterality Date   ABDOMINAL HYSTERECTOMY  1995   Dr. Carlis Abbott   CATARACT EXTRACTION Bilateral 12/2016   COLONOSCOPY  2012   patchy increased intraepithelial lymphocytes - draelos   ESOPHAGOGASTRODUODENOSCOPY     multiple   FISSURECTOMY     INTRAMEDULLARY (IM) NAIL INTERTROCHANTERIC Left 05/09/2021   Procedure: INTRAMEDULLARY (IM) NAIL INTERTROCHANTRIC;  Surgeon: Cammy Copa, MD;  Location: [facility] OR;  Service: Orthopedics;  Laterality: Left;   IR KYPHO EA ADDL LEVEL THORACIC OR LUMBAR  06/26/2021   IR KYPHO THORACIC WITH BONE BIOPSY  06/26/2021   WRIST SURGERY Left    due to fracture    Allergies  Allergen Reactions   Cat Hair Extract Other (See Comments)    Patient states that it causes her eyes to swell shut   Cefaclor Other (See Comments)    Unknown reaction   Codeine Nausea Only   Fish Allergy Nausea Only   Iodinated Contrast Media Other (See Comments)  Unknown reaction     Other Other (See Comments)    Seeds -- Reflux   Penicillins Itching   Percocet [Oxycodone-Acetaminophen] Nausea And Vomiting   Red Dye Other (See Comments)    Reflux   Senna-Docusate Sodium [Sennosides-Docusate Sodium] Other (See Comments)    Severe stomach pains   Shrimp (Diagnostic) Nausea Only    Stomach pain    Tomato Other (See Comments)    Reflux   Hm Lidocaine Patch [Lidocaine] Rash    Allergies as of 06/05/2022       Reactions   Cat Hair Extract Other (See Comments)   Patient states that it causes her eyes to swell shut   Cefaclor Other (See Comments)   Unknown reaction   Codeine Nausea Only   Fish Allergy Nausea Only   Iodinated Contrast Media Other (See Comments)   Unknown reaction   Other Other (See Comments)   Seeds -- Reflux   Penicillins Itching   Percocet [oxycodone-acetaminophen] Nausea And Vomiting   Red Dye Other (See Comments)   Reflux   Senna-docusate Sodium [sennosides-docusate Sodium] Other (See Comments)   Severe stomach pains   Shrimp (diagnostic) Nausea Only   Stomach pain   Tomato Other (See Comments)    Reflux   Hm Lidocaine Patch [lidocaine] Rash        Medication List        Accurate as of June 05, 2022 11:59 PM. If you have any questions, ask your nurse or doctor.          acetaminophen 500 MG tablet Commonly known as: TYLENOL Take 1,000 mg by mouth every 6 (six) hours as needed.   atorvastatin 10 MG tablet Commonly known as: LIPITOR TAKE (1/2) TABLET DAILY.   BIOFREEZE EX Apply topically.   Caltrate 600+D Plus Minerals 600-800 MG-UNIT Chew Chew 2 each by mouth daily.   clonazePAM 1 MG tablet Commonly known as: KLONOPIN *BOTTLE* TAKE 1 TABLET BY MOUTH EVERY NIGHT AT BEDTIME *HAZARDOUS DRUG: WEAR GLOVES* *DO NOT CRUSH* [phone] CALL TO LET HER KNOW THE DAY WE COME   ENSURE MAX PROTEIN PO Take 1 Bottle by mouth daily.   meclizine 25 MG tablet Commonly known as: ANTIVERT Take 0.5 tablets (12.5 mg total) by mouth 3 (three) times daily as needed for dizziness.   polyethylene glycol powder 17 GM/SCOOP powder Commonly known as: GLYCOLAX/MIRALAX Take 17 g by mouth daily.   PRESERVISION AREDS PO Take 1 tablet by mouth 2 (two) times daily.   Soothe XP Soln Place 1 drop into both eyes 4 (four) times daily.   TEGretol 200 MG tablet Generic drug: carbamazepine Take 1.5 tablets (300 mg total) by mouth at bedtime.   Vitamin D (Ergocalciferol) 1.25 MG (50000 UNIT) Caps capsule Commonly known as: DRISDOL TAKE ONE CAPSULE BY MOUTH ONCE A WEEK        Review of Systems:  Review of Systems  Constitutional:  Negative for appetite change, fatigue and fever.  HENT:  Positive for hearing loss. Negative for congestion and voice change.   Eyes:  Negative for visual disturbance.  Respiratory:  Negative for shortness of breath.   Cardiovascular:  Negative for leg swelling.  Gastrointestinal:  Negative for abdominal pain, constipation, diarrhea, nausea and vomiting.  Genitourinary:  Positive for frequency. Negative for dysuria and urgency.       The patient stated  she pushes her suprapubic region to help emptying her bladder  Musculoskeletal:  Positive for arthralgias, back pain and gait problem.  Left hip pain when walking, better.   Skin:  Negative for color change.       Left hip surgical incision is healed.   Neurological:  Positive for numbness. Negative for seizures and weakness.       Tingling/numbness left foot sometimes. Occasionally left hip/leg pain sometimes. Lightheaded when not sleeping well in lifetime.   Psychiatric/Behavioral:  Positive for sleep disturbance. Negative for behavioral problems. The patient is not nervous/anxious.        Feels lightheaded if not sleep well at night.    Health Maintenance  Topic Date Due   Diabetic kidney evaluation - Urine ACR  04/19/2021   COVID-19 Vaccine (7 - 2023-24 season) 10/18/2021   FOOT EXAM  04/11/2022   OPHTHALMOLOGY EXAM  05/07/2022   INFLUENZA VACCINE  09/18/2022   Medicare Annual Wellness (AWV)  10/30/2022   HEMOGLOBIN A1C  12/03/2022   Diabetic kidney evaluation - eGFR measurement  06/03/2023   MAMMOGRAM  05/11/2024   DTaP/Tdap/Td (2 - Td or Tdap) 08/21/2027   Pneumonia Vaccine 1+ Years old  Completed   DEXA SCAN  Completed   Zoster Vaccines- Shingrix  Completed   HPV VACCINES  Aged Out    Physical Exam: Vitals:   06/05/22 1303  BP: (!) 100/58  Pulse: 88  Temp: (!) 97.4 F (36.3 C)  SpO2: 98%  Weight: 106 lb (48.1 kg)  Height: 5\' 5"  (1.651 m)   Body mass index is 17.64 kg/m. Physical Exam Vitals and nursing note reviewed.  Constitutional:      Appearance: Normal appearance.  HENT:     Head: Normocephalic and atraumatic.     Mouth/Throat:     Mouth: Mucous membranes are moist.  Eyes:     Extraocular Movements: Extraocular movements intact.     Conjunctiva/sclera: Conjunctivae normal.     Pupils: Pupils are equal, round, and reactive to light.  Cardiovascular:     Rate and Rhythm: Normal rate and regular rhythm.     Heart sounds: No murmur  heard. Pulmonary:     Effort: Pulmonary effort is normal.     Breath sounds: No rales.  Abdominal:     General: Bowel sounds are normal.     Palpations: Abdomen is soft.     Tenderness: There is no abdominal tenderness.  Musculoskeletal:     Cervical back: Normal range of motion and neck supple.     Right lower leg: No edema.     Left lower leg: No edema.     Comments: S/p left hip ORIF.   Skin:    General: Skin is warm and dry.     Comments: Left hip surgical scar  Neurological:     General: No focal deficit present.     Mental Status: She is alert and oriented to person, place, and time. Mental status is at baseline.     Gait: Gait abnormal.  Psychiatric:        Mood and Affect: Mood normal.        Behavior: Behavior normal.        Thought Content: Thought content normal.        Judgment: Judgment normal.     Labs reviewed: Basic Metabolic Panel: Recent Labs    06/22/21 0312 06/26/21 0056 07/05/21 0000 06/03/22 0718  NA 133* 130* 136* 144  K 3.7 4.0 4.3 4.0  CL 100 97* 97* 106  CO2 28 26 27* 29  GLUCOSE 155* 186*  --  111*  BUN 9  13 13 18   CREATININE 0.52 0.54 0.5 0.63  CALCIUM 8.7* 9.2 8.9 9.3  TSH  --   --   --  3.40   Liver Function Tests: Recent Labs    06/21/21 1145 06/03/22 0718  AST 30 26  ALT 32 20  ALKPHOS 99  --   BILITOT 0.5 0.6  PROT 6.6 6.3  ALBUMIN 4.3  --    No results for input(s): "LIPASE", "AMYLASE" in the last 8760 hours. No results for input(s): "AMMONIA" in the last 8760 hours. CBC: Recent Labs    06/21/21 1145 06/26/21 0056 07/05/21 0000 06/03/22 0718  WBC 10.0 6.1 4.9 4.6  NEUTROABS 8.3* 3.9 2,685.00 2,571  HGB 13.0 13.1 12.9 13.3  HCT 39.7 38.2 39 40.9  MCV 97.3 96.5  --  96.5  PLT 215 194 300 227   Lipid Panel: Recent Labs    06/03/22 0718  CHOL 153  HDL 51  LDLCALC 81  TRIG 112  CHOLHDL 3.0   Lab Results  Component Value Date   HGBA1C 6.2 (H) 06/03/2022    Procedures since last visit: MM 3D SCREEN  BREAST BILATERAL  Result Date: 05/13/2022 CLINICAL DATA:  Screening. EXAM: DIGITAL SCREENING BILATERAL MAMMOGRAM WITH TOMOSYNTHESIS AND CAD TECHNIQUE: Bilateral screening digital craniocaudal and mediolateral oblique mammograms were obtained. Bilateral screening digital breast tomosynthesis was performed. The images were evaluated with computer-aided detection. COMPARISON:  Previous exam(s). ACR Breast Density Category c: The breasts are heterogeneously dense, which may obscure small masses. FINDINGS: There are no findings suspicious for malignancy. IMPRESSION: No mammographic evidence of malignancy. A result letter of this screening mammogram will be mailed directly to the patient. RECOMMENDATION: Screening mammogram in one year. (Code:SM-B-01Y) BI-RADS CATEGORY  1: Negative. Electronically Signed   By: Sande Brothers M.D.   On: 05/13/2022 13:26   DG BONE DENSITY (DXA)  Result Date: 05/12/2022 EXAM: DUAL X-RAY ABSORPTIOMETRY (DXA) FOR BONE MINERAL DENSITY IMPRESSION: Referring Physician:  Tonya Wantz X Camaryn Lumbert Your patient completed a bone mineral density test using GE Lunar iDXA system (analysis version: 16). Technologist: lmn PATIENT: Name: Erin Ochoa, Erin Ochoa Patient ID: [id number] Birth Date: 05-06-1940 Height: 64.8 in. Sex: Female Measured: 05/12/2022 Weight: 104.8 lbs. Indications: Advanced Age, Bilateral Ovariectomy, Caucasian, Estrogen Deficient, History of Fracture (Adult) (V15.51), Hysterectomy, Klonopin, Osteoporosis (733), Postmenopausal, Tegretol Fractures: Left Femur, Left Wrist, Vertebrae Treatments: Calcium (E943.0), Vitamin D (E933.5) ASSESSMENT: The BMD measured at Femur Total is 0.516 g/cm2 with a T-score of -3.9. This patient is considered osteoporotic according to World Health Organization [facility]) criteria. The quality of the exam is good. L1 was excluded due to kyphoplasty. Left hip excluded due to surgical hardware. Site Region Measured Date Measured Age YA BMD Significant CHANGE T-score AP Spine    L2-L4   5/12[age]2022    81.8         -3.3    0.800 g/cm2 AP Spine    L2-L4  09/13/2019    79.1         -3.5    0.781 g/cm2 Right Femur Total  5/12/2022    81.8         -3.9    0.516 g/cm2 * Right Femur Total  09/13/2019    79.1         -3.6    0.560 g/cm2 World Health Organization [facility]) criteria for post-menopausal, Caucasian Women: Normal       T-score at or above -1 SD Osteopenia   T-score between -1 and -2.5 SD Osteoporosis T-score at or  below -2.5 SD RECOMMENDATION: 1. All patients should optimize calcium and vitamin D intake. 2. Consider FDA-approved medical therapies in postmenopausal women and men aged 39 years and older, based on the following: a. A hip or vertebral (clinical or morphometric) fracture. b. T-score = -2.5 at the femoral neck or spine after appropriate evaluation to exclude secondary causes. c. Low bone mass (T-score between -1.0 and -2.5 at the femoral neck or spine) and a 10-year probability of a hip fracture = 3% or a 10-year probability of a major osteoporosis-related fracture = 20% based on the [place]-adapted WHO algorithm. d. Clinician judgment and/or patient preferences may indicate treatment for people with 10-year fracture probabilities above or below these levels. FOLLOW-UP: Patients with diagnosis of osteoporosis or at high risk for fracture should have regular bone mineral density tests.? Patients eligible for Medicare are allowed routine testing every 2 years.? The testing frequency can be increased to one year for patients who have rapidly progressing disease, are receiving or discontinuing medical therapy to restore bone mass, or have additional risk factors. I have reviewed this study and agree with the findings. [facility] Electronically Signed   By: Romona Curls M.D.   On: 05/12/2022 12:39    Assessment/Plan  Hyperlipidemia takes Atorvastatin  qd, LDL 81 06/13/22  Pre-diabetes Hgb a1c 5.7 04/25/21<<6.2 06/03/22, lifestyle modification  Osteoporosis  takes  Reclast, t-score -3.6 09/13/19, -3.5 05/12/22  Insomnia  takes Clonazepam, failed GDR,  failed Ambien. TSH 3.4 06/03/22  Constipation stable, on MiraLax, Senokot S II bid.   Epilepsy without status epilepticus, not intractable ([name]) no active seizures since last seen, failed generic Tegretol in the past, on Tegretol  qd, Clonazepam hs (GDR to stop on her own didn't cause active seizures, but flare up insomnia and anxiety). S/p Neurology   Osteoarthritis s/p left hip surgery, s/p Kyphplasty, ambulate independently   Labs/tests ordered:  none  Next appt:  11/06/2022

## 2022-06-05 NOTE — Assessment & Plan Note (Signed)
takes Reclast, t-score -3.6 09/13/19, -3.5 05/12/22

## 2022-06-05 NOTE — Telephone Encounter (Signed)
Called [employer].  [employer] [phone] BIN [id number] PCN [id number] Grp [id number] ID: [id number]  [employer] regarding Prior Authorization on patient's Tegretol and spoke with Toniann Fail. Prior Authorization is Pending with insurance. Stated that it will take 24 hours for determination and it will be faxed to office.   Patient Notified.  Awaiting Determination.

## 2022-06-05 NOTE — Assessment & Plan Note (Signed)
takes Clonazepam, failed GDR,  failed Ambien. TSH 3.4 06/03/22

## 2022-06-05 NOTE — Assessment & Plan Note (Signed)
no active seizures since last seen, failed generic Tegretol in the past, on Tegretol 200mg qd, Clonazepam hs (GDR to stop on her own didn't cause active seizures, but flare up insomnia and anxiety). S/p Neurology  

## 2022-06-05 NOTE — Assessment & Plan Note (Signed)
Hgb a1c 5.7 04/25/21<<6.2 06/03/22, lifestyle modification

## 2022-06-06 ENCOUNTER — Telehealth: Payer: Self-pay

## 2022-06-06 NOTE — Telephone Encounter (Signed)
Attempted to call patient to let her know that her Medication has been approved and that she should reach out to the pharmacy.

## 2022-06-06 NOTE — Telephone Encounter (Signed)
Patient insurance call to let us know that the commission will make a decision of approval or denial for Pre- Authorization for Tegretol within 72 hours of begin faxed to the office.  Confirmation number is [id number] [id number].

## 2022-06-09 ENCOUNTER — Encounter: Payer: Self-pay | Admitting: Nurse Practitioner

## 2022-06-16 NOTE — Telephone Encounter (Signed)
Patient notified and agreed.  

## 2022-06-17 DIAGNOSIS — H353132 Nonexudative age-related macular degeneration, bilateral, intermediate dry stage: Secondary | ICD-10-CM | POA: Diagnosis not present

## 2022-06-17 DIAGNOSIS — H26493 Other secondary cataract, bilateral: Secondary | ICD-10-CM | POA: Diagnosis not present

## 2022-06-17 DIAGNOSIS — H40013 Open angle with borderline findings, low risk, bilateral: Secondary | ICD-10-CM | POA: Diagnosis not present

## 2022-06-17 DIAGNOSIS — H04123 Dry eye syndrome of bilateral lacrimal glands: Secondary | ICD-10-CM | POA: Diagnosis not present

## 2022-06-17 DIAGNOSIS — H35722 Serous detachment of retinal pigment epithelium, left eye: Secondary | ICD-10-CM | POA: Diagnosis not present

## 2022-07-02 DIAGNOSIS — H26492 Other secondary cataract, left eye: Secondary | ICD-10-CM | POA: Diagnosis not present

## 2022-07-02 DIAGNOSIS — H26493 Other secondary cataract, bilateral: Secondary | ICD-10-CM | POA: Diagnosis not present

## 2022-07-02 DIAGNOSIS — Z961 Presence of intraocular lens: Secondary | ICD-10-CM | POA: Diagnosis not present

## 2022-09-24 ENCOUNTER — Other Ambulatory Visit: Payer: Self-pay | Admitting: Nurse Practitioner

## 2022-09-24 DIAGNOSIS — E785 Hyperlipidemia, unspecified: Secondary | ICD-10-CM

## 2022-09-26 ENCOUNTER — Encounter: Payer: Self-pay | Admitting: Family Medicine

## 2022-09-29 ENCOUNTER — Encounter: Payer: Self-pay | Admitting: Family Medicine

## 2022-11-04 ENCOUNTER — Other Ambulatory Visit: Payer: Self-pay | Admitting: Nurse Practitioner

## 2022-11-06 ENCOUNTER — Non-Acute Institutional Stay (INDEPENDENT_AMBULATORY_CARE_PROVIDER_SITE_OTHER): Payer: Medicare Other | Admitting: Nurse Practitioner

## 2022-11-06 ENCOUNTER — Encounter: Payer: Self-pay | Admitting: Nurse Practitioner

## 2022-11-06 VITALS — BP 122/76 | HR 74 | Temp 98.6°F | Ht 65.0 in | Wt 105.0 lb

## 2022-11-06 DIAGNOSIS — D500 Iron deficiency anemia secondary to blood loss (chronic): Secondary | ICD-10-CM | POA: Diagnosis not present

## 2022-11-06 DIAGNOSIS — K5904 Chronic idiopathic constipation: Secondary | ICD-10-CM | POA: Diagnosis not present

## 2022-11-06 DIAGNOSIS — Z0000 Encounter for general adult medical examination without abnormal findings: Secondary | ICD-10-CM | POA: Diagnosis not present

## 2022-11-06 DIAGNOSIS — R7303 Prediabetes: Secondary | ICD-10-CM

## 2022-11-06 DIAGNOSIS — R42 Dizziness and giddiness: Secondary | ICD-10-CM

## 2022-11-06 DIAGNOSIS — M810 Age-related osteoporosis without current pathological fracture: Secondary | ICD-10-CM

## 2022-11-06 DIAGNOSIS — E785 Hyperlipidemia, unspecified: Secondary | ICD-10-CM | POA: Diagnosis not present

## 2022-11-06 DIAGNOSIS — K3189 Other diseases of stomach and duodenum: Secondary | ICD-10-CM | POA: Diagnosis not present

## 2022-11-06 DIAGNOSIS — F5101 Primary insomnia: Secondary | ICD-10-CM | POA: Diagnosis not present

## 2022-11-06 DIAGNOSIS — G40822 Epileptic spasms, not intractable, without status epilepticus: Secondary | ICD-10-CM

## 2022-11-06 NOTE — Assessment & Plan Note (Signed)
takes Reclast, t-score -3.6 09/13/19, -3.5 05/12/22

## 2022-11-06 NOTE — Assessment & Plan Note (Addendum)
Stable, off acid reducer, had endoscopy showed non bleeding erosive gastropathy, multiple gastric polyps.

## 2022-11-06 NOTE — Progress Notes (Signed)
Subjective:   Erin Ochoa is a 82 y.o. female who presents for Medicare Annual (Subsequent) preventive examination.  Visit Complete: In person  Patient Medicare AWV questionnaire was completed by the patient on 11/05/22; I have confirmed that all information answered by patient is correct and no changes since this date.  Cardiac Risk Factors include: advanced age (>75men, >73 women)     Objective:    Today's Vitals   11/06/22 1522 11/06/22 1542  BP: 122/76   Pulse: 74   Temp: 98.6 F (37 C)   TempSrc: Temporal   SpO2: 99%   Weight: 105 lb (47.6 kg)   Height: 5\' 5"  (1.651 m)   PainSc: 0-No pain 0-No pain   Body mass index is 17.47 kg/m.     11/06/2022    3:21 PM 02/20/2022    8:01 AM 02/19/2022    2:08 PM 11/21/2021   10:01 AM 10/29/2021   11:16 AM 09/19/2021    9:22 AM 09/12/2021   11:11 AM  Advanced Directives  Does Patient Have a Medical Advance Directive? Yes Yes Yes Yes Yes Yes Yes  Type of Diplomatic Services operational officer Living will;Healthcare Power of Attorney Living will;Healthcare Power of [employer] Power of [employer] Power of [employer] Power of Attorney Healthcare Power of Attorney  Does patient want to make changes to medical advance directive? No - Patient declined No - Patient declined No - Patient declined No - Patient declined No - Patient declined No - Patient declined No - Patient declined  Copy of Healthcare Power of Attorney in Chart? Yes - validated most recent copy scanned in chart (See row information) Yes - validated most recent copy scanned in chart (See row information) Yes - validated most recent copy scanned in chart (See row information) Yes - validated most recent copy scanned in chart (See row information) Yes - validated most recent copy scanned in chart (See row information) Yes - validated most recent copy scanned in chart (See row information) Yes - validated most recent copy scanned in chart (See row  information)    Current Medications (verified) Outpatient Encounter Medications as of 11/06/2022  Medication Sig   acetaminophen (TYLENOL) 500 MG tablet Take 1,000 mg by mouth every 6 (six) hours as needed.   Artificial Tear Solution (SOOTHE XP) SOLN Place 1 drop into both eyes 3 (three) times daily.   atorvastatin (LIPITOR) 10 MG tablet TAKE (1/2) TABLET DAILY.   Calcium Carbonate-Vit D-Min (CALTRATE 600+D PLUS MINERALS) 600-800 MG-UNIT CHEW Chew 2 each by mouth daily.   clonazePAM (KLONOPIN) 1 MG tablet *BOTTLE* TAKE 1 TABLET BY MOUTH EVERY NIGHT AT BEDTIME *HAZARDOUS DRUG: WEAR GLOVES* *DO NOT CRUSH* [phone] CALL TO LET HER KNOW THE DAY WE COME   Multiple Vitamins-Minerals (PRESERVISION AREDS PO) Take 1 tablet by mouth 2 (two) times daily.   Nutritional Supplements (ENSURE MAX PROTEIN PO) Take 1 Bottle by mouth daily.   polyethylene glycol powder (GLYCOLAX/MIRALAX) 17 GM/SCOOP powder Take 17 g by mouth daily.   TEGRETOL 200 MG tablet Take 1.5 tablets (300 mg total) by mouth at bedtime.   Vitamin D, Ergocalciferol, 50000 units CAPS TAKE ONE CAPSULE BY MOUTH ONCE A WEEK   [DISCONTINUED] meclizine (ANTIVERT) 25 MG tablet Take 0.5 tablets (12.5 mg total) by mouth 3 (three) times daily as needed for dizziness. (Patient not taking: Reported on 06/05/2022)   [DISCONTINUED] Menthol, Topical Analgesic, (BIOFREEZE EX) Apply topically.   No facility-administered encounter medications on file as of  11/06/2022.    Allergies (verified) Cat hair extract, Cefaclor, Codeine, Fish allergy, Iodinated contrast media, Other, Penicillins, Percocet [oxycodone-acetaminophen], Red dye #40 (allura red), Senna-docusate sodium [sennosides-docusate sodium], Shrimp (diagnostic), Tomato, and Hm lidocaine patch [lidocaine]   History: Past Medical History:  Diagnosis Date   Anxiety    Cataract    Chronic constipation    Depression    Osteoporosis    Seizures ([name])    epilepsy  last seizure 1977   Past  Surgical History:  Procedure Laterality Date   ABDOMINAL HYSTERECTOMY  1995   Dr. Carlis Abbott   CATARACT EXTRACTION Bilateral 12/2016   COLONOSCOPY  2012   patchy increased intraepithelial lymphocytes - draelos   ESOPHAGOGASTRODUODENOSCOPY     multiple   FISSURECTOMY     INTRAMEDULLARY (IM) NAIL INTERTROCHANTERIC Left 05/09/2021   Procedure: INTRAMEDULLARY (IM) NAIL INTERTROCHANTRIC;  Surgeon: Cammy Copa, MD;  Location: [facility] OR;  Service: Orthopedics;  Laterality: Left;   IR KYPHO EA ADDL LEVEL THORACIC OR LUMBAR  06/26/2021   IR KYPHO THORACIC WITH BONE BIOPSY  06/26/2021   WRIST SURGERY Left    due to fracture   Family History  Problem Relation Age of Onset   Alcohol abuse Father    Diabetes Father    Diabetes Maternal Grandmother    Diabetes Paternal Grandmother    Colon cancer Maternal Grandfather    Esophageal cancer Neg Hx    Rectal cancer Neg Hx    Stomach cancer Neg Hx    Social History   Socioeconomic History   Marital status: Widowed    Spouse name: Not on file   Number of children: 0   Years of education: Not on file   Highest education level: Some college, no degree  Occupational History   Occupation: Retired  Tobacco Use   Smoking status: Former    Current packs/day: 0.00    Average packs/day: 1 pack/day for 25.0 years (25.0 ttl pk-yrs)    Types: Cigarettes    Start date: 02/18/1956    Quit date: 02/17/1981    Years since quitting: 41.7   Smokeless tobacco: Never  Vaping Use   Vaping status: Never Used  Substance and Sexual Activity   Alcohol use: No    Alcohol/week: 0.0 standard drinks of alcohol   Drug use: No   Sexual activity: Not Currently  Other Topics Concern   Not on file  Social History Narrative   Social History     Marital status: Widowed           Spouse name:                        Years of education:  1 year college              Number of children:0           Widowed no children      Occupational History: Armed forces technical officer, Adm. Therapist, music care, [employer]     None on file      Social History Main Topics     Smoking status: Former Smoker                                             Packs/day: 1.00      Years: 0.00  Types: Cigarettes        Smokeless tobacco: Not on file                        Alcohol use: No               Drug use: No               Sexual activity: Not on file            Does not drink caffeine, but does eat chocolate.   Does live in an apartment ([address]) retirement community does exercise : walks daily   Right handed         Social Determinants of Health   Financial Resource Strain: Low Risk  (03/13/2017)   Overall Financial Resource Strain (CARDIA)    Difficulty of Paying Living Expenses: Not hard at all  Food Insecurity: No Food Insecurity (03/13/2017)   Hunger Vital Sign    Worried About Running Out of Food in the Last Year: Never true    Ran Out of Food in the Last Year: Never true  Transportation Needs: No Transportation Needs (03/13/2017)   PRAPARE - Administrator, Civil Service (Medical): No    Lack of Transportation (Non-Medical): No  Physical Activity: Sufficiently Active (03/13/2017)   Exercise Vital Sign    Days of Exercise per Week: 5 days    Minutes of Exercise per Session: 30 min  Stress: No Stress Concern Present (03/13/2017)   [employer] of Occupational Health - Occupational Stress Questionnaire    Feeling of Stress : Only a little  Social Connections: Somewhat Isolated (03/13/2017)   Social Connection and Isolation Panel [NHANES]    Frequency of Communication with Friends and Family: More than three times a week    Frequency of Social Gatherings with Friends and Family: More than three times a week    Attends Religious Services: More than 4 times per year    Active Member of [employer] or Organizations: No    Attends Banker Meetings: Never    Marital Status: Widowed    Tobacco  Counseling Counseling given: Not Answered   Clinical Intake:  Pre-visit preparation completed: Yes  Pain : No/denies pain Pain Score: 0-No pain     BMI - recorded: 17.47 Nutritional Status: BMI <19  Underweight Nutritional Risks: None Diabetes: No  How often do you need to have someone help you when you read instructions, pamphlets, or other written materials from your doctor or pharmacy?: 1 - Never What is the last grade level you completed in school?: business college  Interpreter Needed?: No  Information entered by :: Asahel Risden Nedra Hai NP   Activities of Daily Living    11/06/2022    3:48 PM  In your present state of health, do you have any difficulty performing the following activities:  Hearing? 0  Vision? 0  Difficulty concentrating or making decisions? 0  Walking or climbing stairs? 0  Dressing or bathing? 0  Doing errands, shopping? 0  Preparing Food and eating ? N  Using the Toilet? N  Do you have problems with loss of bowel control? N  Managing your Medications? N  Managing your Finances? N  Housekeeping or managing your Housekeeping? N    Patient Care Team: Bayne Fosnaugh X, NP as PCP - General (Internal Medicine) Tynlee Bayle X, NP as Nurse Practitioner (Internal Medicine)  Indicate any recent Medical Services you may have received  from other than [facility] providers in the past year (date may be approximate).     Assessment:   This is a routine wellness examination for Erin Ochoa.  Hearing/Vision screen Hearing Screening - Comments:: No hearing issues  Vision Screening - Comments:: Last eye exam greater than 12 months ago. Pending appointment with Dr.Hecker,  In October 2024    Goals Addressed             This Visit's Progress    Maintain Mobility and Function       Evidence-based guidance:  Acknowledge and validate impact of pain, loss of strength and potential disfigurement (hand osteoarthritis) on mental health and daily life, such as social isolation,  anxiety, depression, impaired sexual relationship and   injury from falls.  Anticipate referral to physical or occupational therapy for assessment, therapeutic exercise and recommendation for adaptive equipment or assistive devices; encourage participation.  Assess impact on ability to perform activities of daily living, as well as engage in sports and leisure events or requirements of work or school.  Provide anticipatory guidance and reassurance about the benefit of exercise to maintain function; acknowledge and normalize fear that exercise may worsen symptoms.  Encourage regular exercise, at least 10 minutes at a time for 45 minutes per week; consider yoga, water exercise and proprioceptive exercises; encourage use of wearable activity tracker to increase motivation and adherence.  Encourage maintenance or resumption of daily activities, including employment, as pain allows and with minimal exposure to trauma.  Assist patient to advocate for adaptations to the work environment.  Consider level of pain and function, gender, age, lifestyle, patient preference, quality of life, readiness and ?ocapacity to benefit? when recommending patients for orthopaedic surgery consultation.  Explore strategies, such as changes to medication regimen or activity that enables patient to anticipate and manage flare-ups that increase deconditioning and disability.  Explore patient preferences; encourage exposure to a broader range of activities that have been avoided for fear of experiencing pain.  Identify barriers to participation in therapy or exercise, such as pain with activity, anticipated or imagined pain.  Monitor postoperative joint replacement or any preexisting joint replacement for ongoing pain and loss of function; provide social support and encouragement throughout recovery.   Notes:       Depression Screen    11/06/2022    3:41 PM 11/06/2022    3:20 PM 10/29/2021   11:13 AM 10/18/2020    1:13 PM  07/29/2018    2:08 PM 03/13/2017   11:11 AM 12/11/2016    3:11 PM  PHQ 2/9 Scores  PHQ - 2 Score 0 0 0 0 0 0 0    Fall Risk    11/06/2022    3:42 PM 11/06/2022    3:20 PM 06/05/2022    1:04 PM 02/20/2022    1:00 PM 11/21/2021   10:01 AM  Fall Risk   Falls in the past year? 0 0 0 0 1  Number falls in past yr:  0 0 0 0  Injury with Fall?  0 0 0 0  Risk for fall due to :  No Fall Risks No Fall Risks No Fall Risks History of fall(s)  Follow up  Falls evaluation completed Falls evaluation completed Falls evaluation completed Falls evaluation completed    MEDICARE RISK AT HOME:    TIMED UP AND GO:  Was the test performed?  No    Cognitive Function:    11/06/2022    3:26 PM  MMSE - Mini Mental State  Exam  Orientation to time 5  Orientation to Place 5  Registration 3  Attention/ Calculation 5  Recall 2  Language- name 2 objects 2  Language- repeat 1  Language- follow 3 step command 3  Language- read & follow direction 1  Write a sentence 1  Copy design 1  Total score 29        10/29/2021   11:16 AM 10/18/2020    1:16 PM  6CIT Screen  What Year? 0 points 0 points  What month? 0 points 0 points  What time? 0 points 0 points  Count back from 20 0 points 2 points  Months in reverse 0 points 0 points  Repeat phrase 0 points 0 points  Total Score 0 points 2 points    Immunizations Immunization History  Administered Date(s) Administered   Fluad Quad(high Dose 65+) 12/03/2020   Influenza Whole 11/19/2017   Influenza, High Dose Seasonal PF 11/26/2016, 12/01/2018, 11/30/2019, 11/21/2021   Influenza-Unspecified 11/18/2014, 11/29/2015   Moderna Covid-19 Vaccine Bivalent Booster 6yrs & up 07/05/2021   Moderna Sars-Covid-2 Vaccination 02/21/2019, 03/21/2019, 12/27/2019, 06/26/2020   Pfizer Covid-19 Vaccine Bivalent Booster 76yrs & up 11/06/2020   Pneumococcal Conjugate-13 09/24/2017   Pneumococcal Polysaccharide-23 12/30/2012, 09/18/2018   Respiratory Syncytial Virus  Vaccine,Recomb Aduvanted(Arexvy) 02/01/2022, 02/06/2022   Tdap 08/20/2017   Zoster Recombinant(Shingrix) 07/21/2017, 12/08/2017   Zoster, Live 05/28/2007    TDAP status: Up to date  Flu Vaccine status: Up to date  Pneumococcal vaccine status: Up to date  Covid-19 vaccine status: Completed vaccines  Qualifies for Shingles Vaccine? Yes   Zostavax completed Yes   Shingrix Completed?: Yes  Screening Tests Health Maintenance  Topic Date Due   Diabetic kidney evaluation - Urine ACR  04/19/2021   INFLUENZA VACCINE  09/18/2022   COVID-19 Vaccine (7 - 2023-24 season) 10/19/2022   OPHTHALMOLOGY EXAM  12/18/2022 (Originally 05/07/2022)   HEMOGLOBIN A1C  12/03/2022   Diabetic kidney evaluation - eGFR measurement  06/03/2023   FOOT EXAM  11/06/2023   Medicare Annual Wellness (AWV)  11/06/2023   MAMMOGRAM  05/11/2024   DTaP/Tdap/Td (2 - Td or Tdap) 08/21/2027   Pneumonia Vaccine 59+ Years old  Completed   DEXA SCAN  Completed   Zoster Vaccines- Shingrix  Completed   HPV VACCINES  Aged Out    Health Maintenance  Health Maintenance Due  Topic Date Due   Diabetic kidney evaluation - Urine ACR  04/19/2021   INFLUENZA VACCINE  09/18/2022   COVID-19 Vaccine (7 - 2023-24 season) 10/19/2022    Colorectal cancer screening: No longer required.   Mammogram status: No longer required due to aged out.  Bone Density status: Completed 05/12/22. Results reflect: Bone density results: OSTEOPOROSIS. Repeat every 2 years.  Lung Cancer Screening: (Low Dose CT Chest recommended if Age 55-80 years, 20 pack-year currently smoking OR have quit w/in 15years.) does not qualify.   Lung Cancer Screening Referral: no  Additional Screening:  Hepatitis C Screening: does not qualify; Completed   Vision Screening: Recommended annual ophthalmology exams for early detection of glaucoma and other disorders of the eye. Is the patient up to date with their annual eye exam?  Yes  Who is the provider or what  is the name of the office in which the patient attends annual eye exams? Dr. Marya Landry If pt is not established with a provider, would they like to be referred to a provider to establish care? No .   Dental Screening: Recommended annual dental exams for proper  oral hygiene  Diabetic Foot Exam: done 11/06/22  Community Resource Referral / Chronic Care Management: CRR required this visit?  No   CCM required this visit?  No1     Plan:     I have personally reviewed and noted the following in the patient's chart:   Medical and social history Use of alcohol, tobacco or illicit drugs  Current medications and supplements including opioid prescriptions. Patient is not currently taking opioid prescriptions. Functional ability and status Nutritional status Physical activity Advanced directives List of other physicians Hospitalizations, surgeries, and ER visits in previous 12 months Vitals Screenings to include cognitive, depression, and falls Referrals and appointments  In addition, I have reviewed and discussed with patient certain preventive protocols, quality metrics, and best practice recommendations. A written personalized care plan for preventive services as well as general preventive health recommendations were provided to patient.     Malyia Moro X Caroleann Casler, NP   11/08/2022   After Visit Summary: (In Person-Printed) AVS printed and given to the patient

## 2022-11-06 NOTE — Assessment & Plan Note (Signed)
better, MRI brain 06/25/21, 1. No acute finding, including infarct. 2. Generalized brain atrophy.

## 2022-11-06 NOTE — Assessment & Plan Note (Signed)
stable, on MiraLax, Senokot S II bid.

## 2022-11-06 NOTE — Assessment & Plan Note (Signed)
takes Atorvastatin 5mg  qd, LDL 81 06/13/22

## 2022-11-06 NOTE — Assessment & Plan Note (Signed)
no active seizures since last seen, failed generic Tegretol in the past, on Tegretol 200mg  qd, Clonazepam hs (GDR to stop on her own didn't cause active seizures, but flare up insomnia and anxiety). S/p Neurology

## 2022-11-06 NOTE — Assessment & Plan Note (Signed)
takes Clonazepam, failed GDR,  failed Ambien. TSH 3.4 06/03/22

## 2022-11-06 NOTE — Assessment & Plan Note (Signed)
Hgb 12.5 06/03/22

## 2022-11-06 NOTE — Assessment & Plan Note (Signed)
Hgb a1c 5.7 04/25/21<<6.2 06/03/22, lifestyle modification

## 2022-11-06 NOTE — Progress Notes (Deleted)
Location:   [facility]   Place of Service:    Provider: Chipper Oman NP  Code Status: DNR Goals of Care:     02/20/2022    8:01 AM  Advanced Directives  Does Erin Ochoa Have a Medical Advance Directive? Yes  Type of Advance Directive Living will;Healthcare Power of Attorney  Does Erin Ochoa want to make changes to medical advance directive? No - Erin Ochoa declined  Copy of Healthcare Power of Attorney in Chart? Yes - validated most recent copy scanned in chart (See row information)     No chief complaint on file.   HPI: Erin Ochoa is a 82 y.o. female seen today for medical management of chronic diseases.     OA, s/p left hip surgery, s/p Kyphplasty, ambulates with walker.              Dizziness: better, MRI brain 06/25/21, 1. No acute finding, including infarct. 2. Generalized brain atrophy.              Post op anemia, Hgb 12.5 06/03/22             Seizures, no active seizures since last seen, failed generic Tegretol in the past, on Tegretol 200mg  qd, Clonazepam hs (GDR to stop on her own didn't cause active seizures, but flare up insomnia and anxiety). S/p Neurology              Constipation, stable, on MiraLax, Senokot S II bid.              Hyperlipidemia, takes Atorvastatin 5mg  qd, LDL 81 06/13/22             Prediabetic, Hgb a1c 5.7 04/25/21<<6.2 06/03/22, lifestyle modification             OP takes Reclast, t-score -3.6 09/13/19, -3.5 05/12/22             Insomnia, takes Clonazepam, failed GDR,  failed Ambien. TSH 3.4 06/03/22             Erosive gastropathy, off PPI, had endoscopy showed non bleeding erosive gastropathy, multiple gastric polyps.             Past Medical History:  Diagnosis Date  . Anxiety   . Cataract   . Chronic constipation   . Depression   . Osteoporosis   . Seizures ([name])    epilepsy  last seizure 1977    Past Surgical History:  Procedure Laterality Date  . ABDOMINAL HYSTERECTOMY  1995   Dr. Carlis Abbott  . CATARACT EXTRACTION Bilateral 12/2016  .  COLONOSCOPY  2012   patchy increased intraepithelial lymphocytes - draelos  . ESOPHAGOGASTRODUODENOSCOPY     multiple  . FISSURECTOMY    . INTRAMEDULLARY (IM) NAIL INTERTROCHANTERIC Left 05/09/2021   Procedure: INTRAMEDULLARY (IM) NAIL INTERTROCHANTRIC;  Surgeon: Cammy Copa, MD;  Location: [facility] OR;  Service: Orthopedics;  Laterality: Left;  . IR KYPHO EA ADDL LEVEL THORACIC OR LUMBAR  06/26/2021  . IR KYPHO THORACIC WITH BONE BIOPSY  06/26/2021  . WRIST SURGERY Left    due to fracture    Allergies  Allergen Reactions  . Cat Hair Extract Other (See Comments)    Erin Ochoa states that it causes her eyes to swell shut  . Cefaclor Other (See Comments)    Unknown reaction  . Codeine Nausea Only  . Fish Allergy Nausea Only  . Iodinated Contrast Media Other (See Comments)    Unknown reaction    . Other Other (See Comments)  Seeds -- Reflux  . Penicillins Itching  . Percocet [Oxycodone-Acetaminophen] Nausea And Vomiting  . Red Dye #40 (Allura Red) Other (See Comments)    Reflux  . Senna-Docusate Sodium [Sennosides-Docusate Sodium] Other (See Comments)    Severe stomach pains  . Shrimp (Diagnostic) Nausea Only    Stomach pain   . Tomato Other (See Comments)    Reflux  . Hm Lidocaine Patch [Lidocaine] Rash    Allergies as of 11/06/2022       Reactions   Cat Hair Extract Other (See Comments)   Erin Ochoa states that it causes her eyes to swell shut   Cefaclor Other (See Comments)   Unknown reaction   Codeine Nausea Only   Fish Allergy Nausea Only   Iodinated Contrast Media Other (See Comments)   Unknown reaction   Other Other (See Comments)   Seeds -- Reflux   Penicillins Itching   Percocet [oxycodone-acetaminophen] Nausea And Vomiting   Red Dye #40 (allura Red) Other (See Comments)   Reflux   Senna-docusate Sodium [sennosides-docusate Sodium] Other (See Comments)   Severe stomach pains   Shrimp (diagnostic) Nausea Only   Stomach pain   Tomato Other (See Comments)    Reflux   Hm Lidocaine Patch [lidocaine] Rash        Medication List        Accurate as of November 06, 2022 11:28 AM. If you have any questions, ask your nurse or doctor.          acetaminophen 500 MG tablet Commonly known as: TYLENOL Take 1,000 mg by mouth every 6 (six) hours as needed.   atorvastatin 10 MG tablet Commonly known as: LIPITOR TAKE (1/2) TABLET DAILY.   BIOFREEZE EX Apply topically.   Caltrate 600+D Plus Minerals 600-800 MG-UNIT Chew Chew 2 each by mouth daily.   clonazePAM 1 MG tablet Commonly known as: KLONOPIN *BOTTLE* TAKE 1 TABLET BY MOUTH EVERY NIGHT AT BEDTIME *HAZARDOUS DRUG: WEAR GLOVES* *DO NOT CRUSH* [phone] CALL TO LET HER KNOW THE DAY WE COME   ENSURE MAX PROTEIN PO Take 1 Bottle by mouth daily.   meclizine 25 MG tablet Commonly known as: ANTIVERT Take 0.5 tablets (12.5 mg total) by mouth 3 (three) times daily as needed for dizziness.   polyethylene glycol powder 17 GM/SCOOP powder Commonly known as: GLYCOLAX/MIRALAX Take 17 g by mouth daily.   PRESERVISION AREDS PO Take 1 tablet by mouth 2 (two) times daily.   Soothe XP Soln Place 1 drop into both eyes 4 (four) times daily.   TEGretol 200 MG tablet Generic drug: carbamazepine Take 1.5 tablets (300 mg total) by mouth at bedtime.   Vitamin D (Ergocalciferol) 50000 units Caps TAKE ONE CAPSULE BY MOUTH ONCE A WEEK        Review of Systems:  Review of Systems  Constitutional:  Negative for appetite change, fatigue and fever.  HENT:  Positive for hearing loss. Negative for congestion and voice change.   Eyes:  Negative for visual disturbance.  Respiratory:  Negative for shortness of breath.   Cardiovascular:  Negative for leg swelling.  Gastrointestinal:  Negative for abdominal pain, constipation, diarrhea, nausea and vomiting.  Genitourinary:  Positive for frequency. Negative for dysuria and urgency.       The Erin Ochoa stated she pushes her suprapubic region to help  emptying her bladder  Musculoskeletal:  Positive for arthralgias, back pain and gait problem.       Left hip pain when walking, better.   Skin:  Negative for color change.       Left hip surgical incision is healed.   Neurological:  Positive for numbness. Negative for seizures and weakness.       Tingling/numbness left foot sometimes. Occasionally left hip/leg pain sometimes. Lightheaded when not sleeping well in lifetime.   Psychiatric/Behavioral:  Positive for sleep disturbance. Negative for behavioral problems. The Erin Ochoa is not nervous/anxious.        Feels lightheaded if not sleep well at night.    Health Maintenance  Topic Date Due  . Diabetic kidney evaluation - Urine ACR  04/19/2021  . FOOT EXAM  04/11/2022  . OPHTHALMOLOGY EXAM  05/07/2022  . INFLUENZA VACCINE  09/18/2022  . COVID-19 Vaccine (7 - 2023-24 season) 10/19/2022  . Medicare Annual Wellness (AWV)  10/30/2022  . HEMOGLOBIN A1C  12/03/2022  . Diabetic kidney evaluation - eGFR measurement  06/03/2023  . MAMMOGRAM  05/11/2024  . DTaP/Tdap/Td (2 - Td or Tdap) 08/21/2027  . Pneumonia Vaccine 77+ Years old  Completed  . DEXA SCAN  Completed  . Zoster Vaccines- Shingrix  Completed  . HPV VACCINES  Aged Out    Physical Exam: There were no vitals filed for this visit. There is no height or weight on file to calculate BMI. Physical Exam Vitals and nursing note reviewed.  Constitutional:      Appearance: Normal appearance.  HENT:     Head: Normocephalic and atraumatic.     Mouth/Throat:     Mouth: Mucous membranes are moist.  Eyes:     Extraocular Movements: Extraocular movements intact.     Conjunctiva/sclera: Conjunctivae normal.     Pupils: Pupils are equal, round, and reactive to light.  Cardiovascular:     Rate and Rhythm: Normal rate and regular rhythm.     Heart sounds: No murmur heard. Pulmonary:     Effort: Pulmonary effort is normal.     Breath sounds: No rales.  Abdominal:     General: Bowel  sounds are normal.     Palpations: Abdomen is soft.     Tenderness: There is no abdominal tenderness.  Musculoskeletal:     Cervical back: Normal range of motion and neck supple.     Right lower leg: No edema.     Left lower leg: No edema.     Comments: S/p left hip ORIF.   Skin:    General: Skin is warm and dry.     Comments: Left hip surgical scar  Neurological:     General: No focal deficit present.     Mental Status: She is alert and oriented to person, place, and time. Mental status is at baseline.     Gait: Gait abnormal.  Psychiatric:        Mood and Affect: Mood normal.        Behavior: Behavior normal.        Thought Content: Thought content normal.        Judgment: Judgment normal.    Labs reviewed: Basic Metabolic Panel: Recent Labs    06/03/22 0718  NA 144  K 4.0  CL 106  CO2 29  GLUCOSE 111*  BUN 18  CREATININE 0.63  CALCIUM 9.3  TSH 3.40   Liver Function Tests: Recent Labs    06/03/22 0718  AST 26  ALT 20  BILITOT 0.6  PROT 6.3   No results for input(s): "LIPASE", "AMYLASE" in the last 8760 hours. No results for input(s): "AMMONIA" in the last 8760 hours. CBC:  Recent Labs    06/03/22 0718  WBC 4.6  NEUTROABS 2,571  HGB 13.3  HCT 40.9  MCV 96.5  PLT 227   Lipid Panel: Recent Labs    06/03/22 0718  CHOL 153  HDL 51  LDLCALC 81  TRIG 112  CHOLHDL 3.0   Lab Results  Component Value Date   HGBA1C 6.2 (H) 06/03/2022    Procedures since last visit: No results found.  Assessment/Plan  No problem-specific Assessment & Plan notes found for this encounter.   Labs/tests ordered:  none  Next appt:  12/04/2022

## 2022-11-06 NOTE — Assessment & Plan Note (Signed)
s/p left hip surgery, s/p Kyphplasty, ambulates with walker.

## 2022-11-11 ENCOUNTER — Other Ambulatory Visit: Payer: Medicare Other

## 2022-11-11 DIAGNOSIS — R7303 Prediabetes: Secondary | ICD-10-CM | POA: Diagnosis not present

## 2022-11-11 LAB — COMPLETE METABOLIC PANEL WITH GFR
AG Ratio: 1.9 (calc) (ref 1.0–2.5)
ALT: 19 U/L (ref 6–29)
AST: 22 U/L (ref 10–35)
Albumin: 4.1 g/dL (ref 3.6–5.1)
Alkaline phosphatase (APISO): 42 U/L (ref 37–153)
BUN: 20 mg/dL (ref 7–25)
CO2: 30 mmol/L (ref 20–32)
Calcium: 9.5 mg/dL (ref 8.6–10.4)
Chloride: 104 mmol/L (ref 98–110)
Creat: 0.63 mg/dL (ref 0.60–0.95)
Globulin: 2.2 g/dL (calc) (ref 1.9–3.7)
Glucose, Bld: 93 mg/dL (ref 65–99)
Potassium: 4.2 mmol/L (ref 3.5–5.3)
Sodium: 142 mmol/L (ref 135–146)
Total Bilirubin: 0.6 mg/dL (ref 0.2–1.2)
Total Protein: 6.3 g/dL (ref 6.1–8.1)
eGFR: 89 mL/min/{1.73_m2} (ref >=60)

## 2022-11-11 LAB — MICROALBUMIN / CREATININE URINE RATIO
Creatinine, Urine: 74 mg/dL (ref 20–275)
Microalb Creat Ratio: 5 mg/g creat (ref <30)
Microalb, Ur: 0.4 mg/dL

## 2022-11-11 LAB — EXTRA URINE SPECIMEN

## 2022-11-18 ENCOUNTER — Other Ambulatory Visit: Payer: Self-pay | Admitting: Nurse Practitioner

## 2022-11-18 DIAGNOSIS — G4700 Insomnia, unspecified: Secondary | ICD-10-CM

## 2022-11-18 NOTE — Telephone Encounter (Signed)
Patient has request refill on medication Clonazepam 1mg . Patient medication last refilled 4/42024. Patient has Non Opioid Contract on file dayed 11/01/2019. Patient has upcoming appointment 12/11/2022. Update Contract added to appointment notes. Patient medication pend and sent to PCP Mast, Man X, NP for approval.

## 2022-12-03 DIAGNOSIS — Z23 Encounter for immunization: Secondary | ICD-10-CM | POA: Diagnosis not present

## 2022-12-04 ENCOUNTER — Encounter: Payer: Medicare Other | Admitting: Nurse Practitioner

## 2022-12-11 ENCOUNTER — Encounter: Payer: Self-pay | Admitting: Nurse Practitioner

## 2022-12-11 ENCOUNTER — Non-Acute Institutional Stay: Payer: Medicare Other | Admitting: Nurse Practitioner

## 2022-12-11 VITALS — BP 104/68 | HR 79 | Temp 97.5°F | Ht 65.0 in | Wt 107.0 lb

## 2022-12-11 DIAGNOSIS — G40822 Epileptic spasms, not intractable, without status epilepticus: Secondary | ICD-10-CM

## 2022-12-11 DIAGNOSIS — D500 Iron deficiency anemia secondary to blood loss (chronic): Secondary | ICD-10-CM | POA: Diagnosis not present

## 2022-12-11 DIAGNOSIS — Z780 Asymptomatic menopausal state: Secondary | ICD-10-CM | POA: Diagnosis not present

## 2022-12-11 DIAGNOSIS — M1990 Unspecified osteoarthritis, unspecified site: Secondary | ICD-10-CM | POA: Diagnosis not present

## 2022-12-11 DIAGNOSIS — R7303 Prediabetes: Secondary | ICD-10-CM

## 2022-12-11 DIAGNOSIS — E785 Hyperlipidemia, unspecified: Secondary | ICD-10-CM | POA: Diagnosis not present

## 2022-12-11 DIAGNOSIS — M810 Age-related osteoporosis without current pathological fracture: Secondary | ICD-10-CM

## 2022-12-11 DIAGNOSIS — E559 Vitamin D deficiency, unspecified: Secondary | ICD-10-CM

## 2022-12-11 DIAGNOSIS — K5904 Chronic idiopathic constipation: Secondary | ICD-10-CM

## 2022-12-11 DIAGNOSIS — F5101 Primary insomnia: Secondary | ICD-10-CM | POA: Diagnosis not present

## 2022-12-11 NOTE — Assessment & Plan Note (Signed)
takes Atorvastatin 5mg  qd, LDL 81 06/13/22, update lipids prior to the next appt.

## 2022-12-11 NOTE — Assessment & Plan Note (Signed)
s/p left hip surgery, s/p Kyphplasty 

## 2022-12-11 NOTE — Assessment & Plan Note (Signed)
takes Reclast, t-score -3.6 09/13/19, -3.5 05/12/22

## 2022-12-11 NOTE — Assessment & Plan Note (Signed)
no active seizures since last seen, failed generic Tegretol in the past, on Tegretol 200mg  qd, Clonazepam hs (GDR to stop on her own didn't cause active seizures, but flare up insomnia and anxiety). S/p Neurology  Update CBC/diff, CMP/eGFR, TSH, Hgb A1c, lipids, Vit d prior to the next appointment.

## 2022-12-11 NOTE — Assessment & Plan Note (Signed)
stable, on MiraLax, Senokot S II bid.

## 2022-12-11 NOTE — Assessment & Plan Note (Signed)
Hgb 12.5 06/03/22, update CBC/diff

## 2022-12-11 NOTE — Progress Notes (Signed)
Location:   [facility]   Place of Service:  Clinic (12) Provider: Chipper Oman NP  Code Status: DNR Goals of Care:     12/11/2022   12:59 PM  Advanced Directives  Does Patient Have a Medical Advance Directive? Yes  Type of Estate agent of [place];Living will  Does patient want to make changes to medical advance directive? No - Patient declined  Copy of Healthcare Power of Attorney in Chart? Yes - validated most recent copy scanned in chart (See row information)     Chief Complaint  Patient presents with   Medical Management of Chronic Issues    6 month follow-up and sign treatment agreement. Patient will get the flu vaccine next Wednesday at [facility]. Discuss DNR. Discuss Miralax, benefiber and seed recommendation.    HPI: Patient is a 82 y.o. female seen today for medical management of chronic diseases.    OA, s/p left hip surgery, s/p Kyphplasty             Dizziness: better, MRI brain 06/25/21, 1. No acute finding, including infarct. 2. Generalized brain atrophy.              Post op anemia, Hgb 12.5 06/03/22             Seizures, no active seizures since last seen, failed generic Tegretol in the past, on Tegretol 200mg  qd, Clonazepam hs (GDR to stop on her own didn't cause active seizures, but flare up insomnia and anxiety). S/p Neurology              Constipation, stable, on MiraLax, Senokot S II bid.              Hyperlipidemia, takes Atorvastatin 5mg  qd, LDL 81 06/13/22             Prediabetic, Hgb a1c 5.7 04/25/21<<6.2 06/03/22, lifestyle modification             OP takes Reclast, t-score -3.6 09/13/19, -3.5 05/12/22             Insomnia, takes Clonazepam, failed GDR,  failed Ambien. TSH 3.4 06/03/22             Erosive gastropathy, off PPI   Past Medical History:  Diagnosis Date   Anxiety    Cataract    Chronic constipation    Depression    Osteoporosis    Seizures ([name])    epilepsy  last seizure 1977    Past Surgical History:  Procedure  Laterality Date   ABDOMINAL HYSTERECTOMY  1995   Dr. Carlis Abbott   CATARACT EXTRACTION Bilateral 12/2016   COLONOSCOPY  2012   patchy increased intraepithelial lymphocytes - draelos   ESOPHAGOGASTRODUODENOSCOPY     multiple   FISSURECTOMY     INTRAMEDULLARY (IM) NAIL INTERTROCHANTERIC Left 05/09/2021   Procedure: INTRAMEDULLARY (IM) NAIL INTERTROCHANTRIC;  Surgeon: Cammy Copa, MD;  Location: [facility] OR;  Service: Orthopedics;  Laterality: Left;   IR KYPHO EA ADDL LEVEL THORACIC OR LUMBAR  06/26/2021   IR KYPHO THORACIC WITH BONE BIOPSY  06/26/2021   WRIST SURGERY Left    due to fracture    Allergies  Allergen Reactions   Cat Hair Extract Other (See Comments)    Patient states that it causes her eyes to swell shut   Cefaclor Other (See Comments)    Unknown reaction   Codeine Nausea Only   Fish Allergy Nausea Only   Iodinated Contrast Media Other (See Comments)  Unknown reaction     Other Other (See Comments)    Seeds -- Reflux   Penicillins Itching   Percocet [Oxycodone-Acetaminophen] Nausea And Vomiting   Red Dye #40 (Allura Red) Other (See Comments)    Reflux   Senna-Docusate Sodium [Sennosides-Docusate Sodium] Other (See Comments)    Severe stomach pains   Shrimp (Diagnostic) Nausea Only    Stomach pain    Tomato Other (See Comments)    Reflux   Hm Lidocaine Patch [Lidocaine] Rash    Allergies as of 12/11/2022       Reactions   Cat Hair Extract Other (See Comments)   Patient states that it causes her eyes to swell shut   Cefaclor Other (See Comments)   Unknown reaction   Codeine Nausea Only   Fish Allergy Nausea Only   Iodinated Contrast Media Other (See Comments)   Unknown reaction   Other Other (See Comments)   Seeds -- Reflux   Penicillins Itching   Percocet [oxycodone-acetaminophen] Nausea And Vomiting   Red Dye #40 (allura Red) Other (See Comments)   Reflux   Senna-docusate Sodium [sennosides-docusate Sodium] Other (See Comments)   Severe  stomach pains   Shrimp (diagnostic) Nausea Only   Stomach pain   Tomato Other (See Comments)   Reflux   Hm Lidocaine Patch [lidocaine] Rash        Medication List        Accurate as of December 11, 2022  4:17 PM. If you have any questions, ask your nurse or doctor.          acetaminophen 500 MG tablet Commonly known as: TYLENOL Take 1,000 mg by mouth every 6 (six) hours as needed.   atorvastatin 10 MG tablet Commonly known as: LIPITOR TAKE (1/2) TABLET DAILY.   Caltrate 600+D Plus Minerals 600-800 MG-UNIT Chew Chew 2 each by mouth daily.   clonazePAM 1 MG tablet Commonly known as: KLONOPIN *BOTTLE* TAKE 1 TABLET BY MOUTH EVERY NIGHT AT BEDTIME *HAZARDOUS DRUG: WEAR GLOVES* *DO NOT CRUSH* [phone] CALL TO LET HER KNOW THE DAY WE COME   ENSURE MAX PROTEIN PO Take 1 Bottle by mouth every other day.   polyethylene glycol powder 17 GM/SCOOP powder Commonly known as: GLYCOLAX/MIRALAX Take 17 g by mouth daily.   PRESERVISION AREDS PO Take 1 tablet by mouth 2 (two) times daily.   Soothe XP Soln Place 1 drop into both eyes 3 (three) times daily.   TEGretol 200 MG tablet Generic drug: carbamazepine Take 1.5 tablets (300 mg total) by mouth at bedtime.   Vitamin D (Ergocalciferol) 50000 units Caps TAKE ONE CAPSULE BY MOUTH ONCE A WEEK        Review of Systems:  Review of Systems  Constitutional:  Negative for appetite change, fatigue and fever.  HENT:  Positive for hearing loss. Negative for congestion and voice change.   Eyes:  Negative for visual disturbance.  Respiratory:  Negative for shortness of breath.   Cardiovascular:  Negative for leg swelling.  Gastrointestinal:  Negative for abdominal pain, constipation, diarrhea, nausea and vomiting.  Genitourinary:  Positive for frequency. Negative for dysuria and urgency.       The patient stated she pushes her suprapubic region to help emptying her bladder  Musculoskeletal:  Positive for arthralgias, back  pain and gait problem.       Left hip pain when walking, better.   Skin:  Negative for color change.       Left hip surgical incision is healed.  Neurological:  Positive for numbness. Negative for seizures and weakness.       Tingling/numbness left foot sometimes. Occasionally left hip/leg pain sometimes. Lightheaded when not sleeping well in lifetime.   Psychiatric/Behavioral:  Positive for sleep disturbance. Negative for behavioral problems. The patient is not nervous/anxious.        Feels lightheaded if not sleep well at night.    Health Maintenance  Topic Date Due   INFLUENZA VACCINE  09/18/2022   HEMOGLOBIN A1C  12/03/2022   OPHTHALMOLOGY EXAM  12/18/2022 (Originally 05/07/2022)   COVID-19 Vaccine (8 - 2023-24 season) 01/28/2023   FOOT EXAM  11/06/2023   Medicare Annual Wellness (AWV)  11/06/2023   Diabetic kidney evaluation - eGFR measurement  11/11/2023   Diabetic kidney evaluation - Urine ACR  11/11/2023   MAMMOGRAM  05/11/2024   DTaP/Tdap/Td (2 - Td or Tdap) 08/21/2027   Pneumonia Vaccine 39+ Years old  Completed   DEXA SCAN  Completed   Zoster Vaccines- Shingrix  Completed   HPV VACCINES  Aged Out    Physical Exam: Vitals:   12/11/22 1302  BP: 104/68  Pulse: 79  Temp: (!) 97.5 F (36.4 C)  TempSrc: Temporal  SpO2: 98%  Weight: 107 lb (48.5 kg)  Height: 5\' 5"  (1.651 m)   Body mass index is 17.81 kg/m. Physical Exam Vitals and nursing note reviewed.  Constitutional:      Appearance: Normal appearance.  HENT:     Head: Normocephalic and atraumatic.     Mouth/Throat:     Mouth: Mucous membranes are moist.  Eyes:     Extraocular Movements: Extraocular movements intact.     Conjunctiva/sclera: Conjunctivae normal.     Pupils: Pupils are equal, round, and reactive to light.  Cardiovascular:     Rate and Rhythm: Normal rate and regular rhythm.     Heart sounds: No murmur heard. Pulmonary:     Effort: Pulmonary effort is normal.     Breath sounds: No rales.   Abdominal:     General: Bowel sounds are normal.     Palpations: Abdomen is soft.     Tenderness: There is no abdominal tenderness.  Musculoskeletal:     Cervical back: Normal range of motion and neck supple.     Right lower leg: No edema.     Left lower leg: No edema.     Comments: S/p left hip ORIF.   Skin:    General: Skin is warm and dry.     Comments: Left hip surgical scar  Neurological:     General: No focal deficit present.     Mental Status: She is alert and oriented to person, place, and time. Mental status is at baseline.     Gait: Gait abnormal.  Psychiatric:        Mood and Affect: Mood normal.        Behavior: Behavior normal.        Thought Content: Thought content normal.        Judgment: Judgment normal.     Labs reviewed: Basic Metabolic Panel: Recent Labs    06/03/22 0718 11/11/22 0725  NA 144 142  K 4.0 4.2  CL 106 104  CO2 29 30  GLUCOSE 111* 93  BUN 18 20  CREATININE 0.63 0.63  CALCIUM 9.3 9.5  TSH 3.40  --    Liver Function Tests: Recent Labs    06/03/22 0718 11/11/22 0725  AST 26 22  ALT 20 19  BILITOT 0.6  0.6  PROT 6.3 6.3   No results for input(s): "LIPASE", "AMYLASE" in the last 8760 hours. No results for input(s): "AMMONIA" in the last 8760 hours. CBC: Recent Labs    06/03/22 0718  WBC 4.6  NEUTROABS 2,571  HGB 13.3  HCT 40.9  MCV 96.5  PLT 227   Lipid Panel: Recent Labs    06/03/22 0718  CHOL 153  HDL 51  LDLCALC 81  TRIG 112  CHOLHDL 3.0   Lab Results  Component Value Date   HGBA1C 6.2 (H) 06/03/2022    Procedures since last visit: No results found.  Assessment/Plan  Epilepsy without status epilepticus, not intractable ([name])  no active seizures since last seen, failed generic Tegretol in the past, on Tegretol 200mg  qd, Clonazepam hs (GDR to stop on her own didn't cause active seizures, but flare up insomnia and anxiety). S/p Neurology  Update CBC/diff, CMP/eGFR, TSH, Hgb A1c, lipids, Vit d prior to  the next appointment.   Osteoarthritis  s/p left hip surgery, s/p Kyphplasty  Blood loss anemia  Hgb 12.5 06/03/22, update CBC/diff  Constipation  stable, on MiraLax, Senokot S II bid.   Hyperlipidemia  takes Atorvastatin 5mg  qd, LDL 81 06/13/22, update lipids prior to the next appt.   Pre-diabetes Hgb a1c 5.7 04/25/21<<6.2 06/03/22, lifestyle modification  Osteoporosis takes Reclast, t-score -3.6 09/13/19, -3.5 05/12/22  Insomnia  takes Clonazepam, failed GDR,  failed Ambien. TSH 3.4 06/03/22   Labs/tests ordered:  CBC/diff, CMP/eGFR, TSH, Hgb A1c, lipids, Vit d  Next appt: 6 months 05/28/22

## 2022-12-11 NOTE — Assessment & Plan Note (Signed)
Hgb a1c 5.7 04/25/21<<6.2 06/03/22, lifestyle modification

## 2022-12-11 NOTE — Assessment & Plan Note (Signed)
takes Clonazepam, failed GDR,  failed Ambien. TSH 3.4 06/03/22

## 2022-12-17 DIAGNOSIS — Z23 Encounter for immunization: Secondary | ICD-10-CM | POA: Diagnosis not present

## 2022-12-25 ENCOUNTER — Other Ambulatory Visit: Payer: Self-pay | Admitting: Nurse Practitioner

## 2022-12-25 NOTE — Telephone Encounter (Signed)
Patient is requesting a refill of the following medications: Requested Prescriptions   Pending Prescriptions Disp Refills   TEGRETOL 200 MG tablet [Pharmacy Med Name: Tegretol 200 mg tablet] 90 tablet 2    Sig: TAKE 1 AND 1/2 TABLETS BY MOUTH AT BEDTIME    Date of last refill:02/05/2022  Refill amount:90 tablets 2 refills

## 2022-12-29 NOTE — Progress Notes (Signed)
[facility] - Clinic Note  01/05/2023     CHIEF COMPLAINT Patient presents for Retina Follow Up  HISTORY OF PRESENT ILLNESS: Erin Ochoa is a 82 y.o. female who presents to the clinic today for:   HPI     Retina Follow Up   Patient presents with  Dry AMD.  In both eyes.  This started years ago.  Severity is moderate.  Duration of 8 months.  Since onset it is stable.  I, the attending physician,  performed the HPI with the patient and updated documentation appropriately.        Comments   Patient states the vision is the same. She is using AT's TID and Systane OU at bedtime.       Last edited by Rennis Chris, MD on 01/05/2023  5:20 PM.     Pt states she is using Soothe XP TID OU and Systane gel at night, she states she can't tell any difference in her vision  Referring physician: Mateo Flow, MD [address] [place],  [place] [address]  HISTORICAL INFORMATION:   Selected notes from the MEDICAL RECORD NUMBER Referred by Dr. Elmer Picker for progressing ARMD  LEE:  Ocular Hx- PMH-    CURRENT MEDICATIONS: Current Outpatient Medications (Ophthalmic Drugs)  Medication Sig   Artificial Tear Solution (SOOTHE XP) SOLN Place 1 drop into both eyes 3 (three) times daily.   No current facility-administered medications for this visit. (Ophthalmic Drugs)   Current Outpatient Medications (Other)  Medication Sig   acetaminophen (TYLENOL) 500 MG tablet Take 1,000 mg by mouth every 6 (six) hours as needed.   atorvastatin (LIPITOR) 10 MG tablet TAKE (1/2) TABLET DAILY.   Calcium Carbonate-Vit D-Min (CALTRATE 600+D PLUS MINERALS) 600-800 MG-UNIT CHEW Chew 2 each by mouth daily.   clonazePAM (KLONOPIN) 1 MG tablet *BOTTLE* TAKE 1 TABLET BY MOUTH EVERY NIGHT AT BEDTIME *HAZARDOUS DRUG: WEAR GLOVES* *DO NOT CRUSH* [phone] CALL TO LET HER KNOW THE DAY WE COME   Multiple Vitamins-Minerals (PRESERVISION AREDS PO) Take 1 tablet by mouth 2 (two) times  daily.   Nutritional Supplements (ENSURE MAX PROTEIN PO) Take 1 Bottle by mouth every other day.   polyethylene glycol powder (GLYCOLAX/MIRALAX) 17 GM/SCOOP powder Take 17 g by mouth daily.   TEGRETOL 200 MG tablet TAKE 1 AND 1/2 TABLETS BY MOUTH AT BEDTIME   Vitamin D, Ergocalciferol, 50000 units CAPS TAKE ONE CAPSULE BY MOUTH ONCE A WEEK   No current facility-administered medications for this visit. (Other)   REVIEW OF SYSTEMS: ROS   Positive for: Eyes Negative for: Constitutional, Gastrointestinal, Neurological, Skin, Genitourinary, Musculoskeletal, HENT, Endocrine, Cardiovascular, Respiratory, Psychiatric, Allergic/Imm, Heme/Lymph Last edited by Charlette Caffey, COT on 01/05/2023  1:41 PM.      ALLERGIES Allergies  Allergen Reactions   Cat Hair Extract Other (See Comments)    Patient states that it causes her eyes to swell shut   Cefaclor Other (See Comments)    Unknown reaction   Codeine Nausea Only   Fish Allergy Nausea Only   Iodinated Contrast Media Other (See Comments)    Unknown reaction     Other Other (See Comments)    Seeds -- Reflux   Penicillins Itching   Percocet [Oxycodone-Acetaminophen] Nausea And Vomiting   Red Dye #40 (Allura Red) Other (See Comments)    Reflux   Senna-Docusate Sodium [Sennosides-Docusate Sodium] Other (See Comments)    Severe stomach pains   Shrimp (Diagnostic) Nausea Only    Stomach  pain    Tomato Other (See Comments)    Reflux   Hm Lidocaine Patch [Lidocaine] Rash   PAST MEDICAL HISTORY Past Medical History:  Diagnosis Date   Anxiety    Cataract    Chronic constipation    Depression    Osteoporosis    Seizures ([name])    epilepsy  last seizure 1977   Past Surgical History:  Procedure Laterality Date   ABDOMINAL HYSTERECTOMY  1995   Dr. Carlis Abbott   CATARACT EXTRACTION Bilateral 12/2016   COLONOSCOPY  2012   patchy increased intraepithelial lymphocytes - draelos   ESOPHAGOGASTRODUODENOSCOPY     multiple    FISSURECTOMY     INTRAMEDULLARY (IM) NAIL INTERTROCHANTERIC Left 05/09/2021   Procedure: INTRAMEDULLARY (IM) NAIL INTERTROCHANTRIC;  Surgeon: Cammy Copa, MD;  Location: [facility] OR;  Service: Orthopedics;  Laterality: Left;   IR KYPHO EA ADDL LEVEL THORACIC OR LUMBAR  06/26/2021   IR KYPHO THORACIC WITH BONE BIOPSY  06/26/2021   WRIST SURGERY Left    due to fracture   FAMILY HISTORY Family History  Problem Relation Age of Onset   Alcohol abuse Father    Diabetes Father    Diabetes Maternal Grandmother    Diabetes Paternal Grandmother    Colon cancer Maternal Grandfather    Esophageal cancer Neg Hx    Rectal cancer Neg Hx    Stomach cancer Neg Hx    SOCIAL HISTORY Social History   Tobacco Use   Smoking status: Former    Current packs/day: 0.00    Average packs/day: 1 pack/day for 25.0 years (25.0 ttl pk-yrs)    Types: Cigarettes    Start date: 02/18/1956    Quit date: 02/17/1981    Years since quitting: 41.9   Smokeless tobacco: Never  Vaping Use   Vaping status: Never Used  Substance Use Topics   Alcohol use: No    Alcohol/week: 0.0 standard drinks of alcohol   Drug use: No       OPHTHALMIC EXAM: Base Eye Exam     Visual Acuity (Snellen - Linear)       Right Left   Dist cc 20/20 +2 20/30   Dist ph cc  NI    Correction: Glasses         Tonometry (Tonopen, 1:44 PM)       Right Left   Pressure 16 16         Pupils       Dark Light Shape React APD   Right 3 2 Round Brisk None   Left 3 2 Round Brisk None         Visual Fields       Left Right    Full Full         Extraocular Movement       Right Left    Full, Ortho Full, Ortho         Neuro/Psych     Oriented x3: Yes   Mood/Affect: Normal         Dilation     Both eyes: 2.5% Phenylephrine, 1.0% Mydriacyl @ 1:41 PM           Slit Lamp and Fundus Exam     Slit Lamp Exam       Right Left   Lids/Lashes Dermatochalasis - upper lid Dermatochalasis - upper lid, mild MGD    Conjunctiva/Sclera White and quiet White and quiet   Cornea mild arcus, 2-3+ fine Punctate epithelial erosions, mild  EBMD, well healed cataract wound 2+ fine Punctate epithelial erosions, arcus, well healed cataract wound   Anterior Chamber deep and clear deep and clear   Iris Round and dilated Round and dilated   Lens PC IOL in good position, trace posterior capsular opacification PC IOL in good position with open PC   Anterior Vitreous syneresis Vitreous syneresis, Posterior vitreous detachment, vitreous condensations         Fundus Exam       Right Left   Disc Pink and Sharp Pink and Sharp   C/D Ratio 0.6 0.6   Macula Flat, Blunted foveal reflex, Drusen, RPE mottling, +PED, No heme or edema Flat, Blunted foveal reflex, central PEDs, Drusen, RPE mottling and clumping, No heme or edema   Vessels attenuated, Tortuous attenuated, Tortuous   Periphery Attached, mild reticular degeneration, No heme Attached, mild reticular degeneration, No heme           Refraction     Wearing Rx       Sphere Cylinder Axis Add   Right -1.75 +1.25 002 +2.50   Left -1.00 +1.75 172 +2.50    Type: PAL           IMAGING AND PROCEDURES  Imaging and Procedures for 01/05/2023  OCT, Retina - OU - Both Eyes       Right Eye Quality was good. Central Foveal Thickness: 314. Progression has been stable. Findings include normal foveal contour, no IRF, no SRF, retinal drusen , pigment epithelial detachment.   Left Eye Quality was good. Central Foveal Thickness: 322. Progression has been stable. Findings include normal foveal contour, no IRF, no SRF, retinal drusen , pigment epithelial detachment (Prominent central PEDs ).   Notes *Images captured and stored on drive  Diagnosis / Impression:  Non-exu ARMD OU - prominent PEDs OU -- stable  Clinical management:  See below  Abbreviations: NFP - Normal foveal profile. CME - cystoid macular edema. PED - pigment epithelial detachment. IRF -  intraretinal fluid. SRF - subretinal fluid. EZ - ellipsoid zone. ERM - epiretinal membrane. ORA - outer retinal atrophy. ORT - outer retinal tubulation. SRHM - subretinal hyper-reflective material. IRHM - intraretinal hyper-reflective material            ASSESSMENT/PLAN:    ICD-10-CM   1. Intermediate stage nonexudative age-related macular degeneration of both eyes  H35.3132 OCT, Retina - OU - Both Eyes    2. Pseudophakia, both eyes  Z96.1     3. Dry eyes  H04.123      1. Age related macular degeneration, non-exudative, both eyes  - intermediate stage with prominent PEDs OU (OS > OD) -- stable  - OCT without significant change from prior  - Recommend amsler grid monitoring  - f/u 9 months, DFE, OCT  2. Pseudophakia OU  - s/p CE/IOL (Dr. Elmer Picker)  - IOLs in good position, doing well  - monitor  3. Dry eyes OU - recommend artificial tears and lubricating ointment as needed -- pt is using Soothe XP TID and Systane / Genteal gel QHS OU - referred to Dr. Noel Gerold for evaluation  Ophthalmic Meds Ordered this visit:  No orders of the defined types were placed in this encounter.    Return in about 9 months (around 10/05/2023) for f/u non-exu ARMD OU, DFE, OCT.  There are no Patient Instructions on file for this visit.  Explained the diagnoses, plan, and follow up with the patient and they expressed understanding.  Patient expressed understanding of the  importance of proper follow up care.   This document serves as a record of services personally performed by Karie Chimera, MD, PhD. It was created on their behalf by Glee Arvin. Manson Passey, OA an ophthalmic technician. The creation of this record is the provider's dictation and/or activities during the visit.    Electronically signed by: Glee Arvin. Manson Passey, OA 01/05/23 5:23 PM  Karie Chimera, M.D., Ph.D. Diseases & Surgery of the Retina and Vitreous [facility]  I have reviewed the above documentation for accuracy  and completeness, and I agree with the above. Karie Chimera, M.D., Ph.D. 01/05/23 5:23 PM  Abbreviations: M myopia (nearsighted); A astigmatism; H hyperopia (farsighted); P presbyopia; Mrx spectacle prescription;  CTL contact lenses; OD right eye; OS left eye; OU both eyes  XT exotropia; ET esotropia; PEK punctate epithelial keratitis; PEE punctate epithelial erosions; DES dry eye syndrome; MGD meibomian gland dysfunction; ATs artificial tears; PFAT's preservative free artificial tears; NSC nuclear sclerotic cataract; PSC posterior subcapsular cataract; ERM epi-retinal membrane; PVD posterior vitreous detachment; RD retinal detachment; DM diabetes mellitus; DR diabetic retinopathy; NPDR non-proliferative diabetic retinopathy; PDR proliferative diabetic retinopathy; CSME clinically significant macular edema; DME diabetic macular edema; dbh dot blot hemorrhages; CWS cotton wool spot; POAG primary open angle glaucoma; C/D cup-to-disc ratio; HVF humphrey visual field; GVF goldmann visual field; OCT optical coherence tomography; IOP intraocular pressure; BRVO Branch retinal vein occlusion; CRVO central retinal vein occlusion; CRAO central retinal artery occlusion; BRAO branch retinal artery occlusion; RT retinal tear; SB scleral buckle; PPV pars plana vitrectomy; VH Vitreous hemorrhage; PRP panretinal laser photocoagulation; IVK intravitreal kenalog; VMT vitreomacular traction; MH Macular hole;  NVD neovascularization of the disc; NVE neovascularization elsewhere; AREDS age related eye disease study; ARMD age related macular degeneration; POAG primary open angle glaucoma; EBMD epithelial/anterior basement membrane dystrophy; ACIOL anterior chamber intraocular lens; IOL intraocular lens; PCIOL posterior chamber intraocular lens; Phaco/IOL phacoemulsification with intraocular lens placement; PRK photorefractive keratectomy; LASIK laser assisted in situ keratomileusis; HTN hypertension; DM diabetes mellitus; COPD  chronic obstructive pulmonary disease

## 2023-01-02 ENCOUNTER — Encounter: Payer: Self-pay | Admitting: Sports Medicine

## 2023-01-02 ENCOUNTER — Non-Acute Institutional Stay: Payer: Medicare Other | Admitting: Sports Medicine

## 2023-01-02 VITALS — BP 122/78 | HR 85 | Temp 97.5°F | Resp 18 | Ht 65.0 in | Wt 110.0 lb

## 2023-01-02 DIAGNOSIS — K5900 Constipation, unspecified: Secondary | ICD-10-CM

## 2023-01-02 NOTE — Progress Notes (Signed)
Careteam: Patient Care Team: Mast, Man X, NP as PCP - General (Internal Medicine) Mast, Man X, NP as Nurse Practitioner (Internal Medicine)  PLACE OF SERVICE:  [facility]  Advanced Directive information Does Patient Have a Medical Advance Directive?: Yes, Type of Advance Directive: Healthcare Power of [place];Living will, Does patient want to make changes to medical advance directive?: No - Patient declined  Allergies  Allergen Reactions   Cat Hair Extract Other (See Comments)    Patient states that it causes her eyes to swell shut   Cefaclor Other (See Comments)    Unknown reaction   Codeine Nausea Only   Fish Allergy Nausea Only   Iodinated Contrast Media Other (See Comments)    Unknown reaction     Other Other (See Comments)    Seeds -- Reflux   Penicillins Itching   Percocet [Oxycodone-Acetaminophen] Nausea And Vomiting   Red Dye #40 (Allura Red) Other (See Comments)    Reflux   Senna-Docusate Sodium [Sennosides-Docusate Sodium] Other (See Comments)    Severe stomach pains   Shrimp (Diagnostic) Nausea Only    Stomach pain    Tomato Other (See Comments)    Reflux   Hm Lidocaine Patch [Lidocaine] Rash    Chief Complaint  Patient presents with   Acute Visit    Constipation     HPI: Patient is a 82 y.o. female is here for acute visit for constipation   Constipation  Moving her bowels every day  C/o passing hard stools and straining with BW Denies bloody or dark stools. She  abdominal pain, nausea, vomiting  Reports that she does not  drink enough water   Pt states she tries to be active and walk every day  Goes to dining hall for her meals   Review of Systems:  Review of Systems  Constitutional:  Negative for chills and fever.  HENT:  Negative for congestion and sore throat.   Eyes:  Negative for double vision.  Respiratory:  Negative for cough, sputum production and shortness of breath.   Cardiovascular:  Negative for chest pain, palpitations and  leg swelling.  Gastrointestinal:  Positive for constipation. Negative for abdominal pain, heartburn and nausea.  Genitourinary:  Negative for dysuria, frequency and hematuria.  Musculoskeletal:  Negative for falls and myalgias.  Neurological:  Negative for dizziness, sensory change and focal weakness.    Past Medical History:  Diagnosis Date   Anxiety    Cataract    Chronic constipation    Depression    Osteoporosis    Seizures ([name])    epilepsy  last seizure 1977   Past Surgical History:  Procedure Laterality Date   ABDOMINAL HYSTERECTOMY  1995   Dr. Carlis Abbott   CATARACT EXTRACTION Bilateral 12/2016   COLONOSCOPY  2012   patchy increased intraepithelial lymphocytes - draelos   ESOPHAGOGASTRODUODENOSCOPY     multiple   FISSURECTOMY     INTRAMEDULLARY (IM) NAIL INTERTROCHANTERIC Left 05/09/2021   Procedure: INTRAMEDULLARY (IM) NAIL INTERTROCHANTRIC;  Surgeon: Cammy Copa, MD;  Location: [facility] OR;  Service: Orthopedics;  Laterality: Left;   IR KYPHO EA ADDL LEVEL THORACIC OR LUMBAR  06/26/2021   IR KYPHO THORACIC WITH BONE BIOPSY  06/26/2021   WRIST SURGERY Left    due to fracture   Social History:   reports that she quit smoking about 41 years ago. Her smoking use included cigarettes. She started smoking about 66 years ago. She has a 25 pack-year smoking history. She has  never used smokeless tobacco. She reports that she does not drink alcohol and does not use drugs.  Family History  Problem Relation Age of Onset   Alcohol abuse Father    Diabetes Father    Diabetes Maternal Grandmother    Diabetes Paternal Grandmother    Colon cancer Maternal Grandfather    Esophageal cancer Neg Hx    Rectal cancer Neg Hx    Stomach cancer Neg Hx     Medications: Patient's Medications  New Prescriptions   No medications on file  Previous Medications   ACETAMINOPHEN (TYLENOL) 500 MG TABLET    Take 1,000 mg by mouth every 6 (six) hours as needed.   ARTIFICIAL TEAR SOLUTION  (SOOTHE XP) SOLN    Place 1 drop into both eyes 3 (three) times daily.   ATORVASTATIN (LIPITOR) 10 MG TABLET    TAKE (1/2) TABLET DAILY.   CALCIUM CARBONATE-VIT D-MIN (CALTRATE 600+D PLUS MINERALS) 600-800 MG-UNIT CHEW    Chew 2 each by mouth daily.   CLONAZEPAM (KLONOPIN) 1 MG TABLET    *BOTTLE* TAKE 1 TABLET BY MOUTH EVERY NIGHT AT BEDTIME *HAZARDOUS DRUG: WEAR GLOVES* *DO NOT CRUSH* [phone] CALL TO LET HER KNOW THE DAY WE COME   MULTIPLE VITAMINS-MINERALS (PRESERVISION AREDS PO)    Take 1 tablet by mouth 2 (two) times daily.   NUTRITIONAL SUPPLEMENTS (ENSURE MAX PROTEIN PO)    Take 1 Bottle by mouth every other day.   POLYETHYLENE GLYCOL POWDER (GLYCOLAX/MIRALAX) 17 GM/SCOOP POWDER    Take 17 g by mouth daily.   TEGRETOL 200 MG TABLET    TAKE 1 AND 1/2 TABLETS BY MOUTH AT BEDTIME   VITAMIN D, ERGOCALCIFEROL, 50000 UNITS CAPS    TAKE ONE CAPSULE BY MOUTH ONCE A WEEK  Modified Medications   No medications on file  Discontinued Medications   No medications on file    Physical Exam:  There were no vitals filed for this visit. There is no height or weight on file to calculate BMI. Wt Readings from Last 3 Encounters:  12/11/22 107 lb (48.5 kg)  11/06/22 105 lb (47.6 kg)  06/05/22 106 lb (48.1 kg)    Physical Exam Constitutional:      Appearance: Normal appearance.  HENT:     Head: Normocephalic and atraumatic.  Cardiovascular:     Rate and Rhythm: Normal rate and regular rhythm.     Heart sounds: No murmur heard. Pulmonary:     Effort: Pulmonary effort is normal. No respiratory distress.     Breath sounds: Normal breath sounds. No wheezing.  Abdominal:     General: Bowel sounds are normal. There is no distension.     Tenderness: There is no abdominal tenderness. There is no guarding or rebound.  Musculoskeletal:        General: No swelling or tenderness.  Skin:    General: Skin is dry.  Neurological:     Mental Status: She is alert. Mental status is at baseline.      Sensory: No sensory deficit.     Motor: No weakness.     Labs reviewed: Basic Metabolic Panel: Recent Labs    06/03/22 0718 11/11/22 0725  NA 144 142  K 4.0 4.2  CL 106 104  CO2 29 30  GLUCOSE 111* 93  BUN 18 20  CREATININE 0.63 0.63  CALCIUM 9.3 9.5  TSH 3.40  --    Liver Function Tests: Recent Labs    06/03/22 0718 11/11/22 0725  AST 26  22  ALT 20 19  BILITOT 0.6 0.6  PROT 6.3 6.3   No results for input(s): "LIPASE", "AMYLASE" in the last 8760 hours. No results for input(s): "AMMONIA" in the last 8760 hours. CBC: Recent Labs    06/03/22 0718  WBC 4.6  NEUTROABS 2,571  HGB 13.3  HCT 40.9  MCV 96.5  PLT 227   Lipid Panel: Recent Labs    06/03/22 0718  CHOL 153  HDL 51  LDLCALC 81  TRIG 112  CHOLHDL 3.0   TSH: Recent Labs    06/03/22 0718  TSH 3.40   A1C: Lab Results  Component Value Date   HGBA1C 6.2 (H) 06/03/2022     Assessment/Plan  1. Constipation, unspecified constipation type Instructed patient to increase oral hydration  Increase fiber intake Take miralax daily prn  Drink prune juice     No follow-ups on file.:

## 2023-01-05 ENCOUNTER — Encounter (INDEPENDENT_AMBULATORY_CARE_PROVIDER_SITE_OTHER): Payer: Self-pay | Admitting: Ophthalmology

## 2023-01-05 ENCOUNTER — Ambulatory Visit (INDEPENDENT_AMBULATORY_CARE_PROVIDER_SITE_OTHER): Payer: Medicare Other | Admitting: Ophthalmology

## 2023-01-05 DIAGNOSIS — H04123 Dry eye syndrome of bilateral lacrimal glands: Secondary | ICD-10-CM

## 2023-01-05 DIAGNOSIS — H353132 Nonexudative age-related macular degeneration, bilateral, intermediate dry stage: Secondary | ICD-10-CM | POA: Diagnosis not present

## 2023-01-05 DIAGNOSIS — Z961 Presence of intraocular lens: Secondary | ICD-10-CM | POA: Diagnosis not present

## 2023-01-05 DIAGNOSIS — H3581 Retinal edema: Secondary | ICD-10-CM

## 2023-01-09 ENCOUNTER — Encounter: Payer: Self-pay | Admitting: Sports Medicine

## 2023-01-09 ENCOUNTER — Non-Acute Institutional Stay: Payer: Medicare Other | Admitting: Sports Medicine

## 2023-01-09 VITALS — BP 100/62 | HR 91 | Temp 97.8°F | Resp 16 | Ht 65.0 in | Wt 103.8 lb

## 2023-01-09 DIAGNOSIS — R42 Dizziness and giddiness: Secondary | ICD-10-CM

## 2023-01-09 DIAGNOSIS — F331 Major depressive disorder, recurrent, moderate: Secondary | ICD-10-CM

## 2023-01-09 DIAGNOSIS — F5101 Primary insomnia: Secondary | ICD-10-CM

## 2023-01-09 DIAGNOSIS — F321 Major depressive disorder, single episode, moderate: Secondary | ICD-10-CM

## 2023-01-09 MED ORDER — VENLAFAXINE HCL ER 37.5 MG PO CP24: 37.5000 mg | ORAL_CAPSULE | Freq: Every day | ORAL | 1 refills | Status: DC

## 2023-01-09 NOTE — Progress Notes (Signed)
Careteam: Patient Care Team: Mast, Man X, NP as PCP - General (Internal Medicine) Mast, Man X, NP as Nurse Practitioner (Internal Medicine)  PLACE OF SERVICE:  [facility]  Advanced Directive information Does Patient Have a Medical Advance Directive?: Yes, Type of Advance Directive: Healthcare Power of [place];Living will, Does patient want to make changes to medical advance directive?: No - Patient declined  Allergies  Allergen Reactions   Cat Hair Extract Other (See Comments)    Patient states that it causes her eyes to swell shut   Cefaclor Other (See Comments)    Unknown reaction   Codeine Nausea Only   Fish Allergy Nausea Only   Iodinated Contrast Media Other (See Comments)    Unknown reaction     Other Other (See Comments)    Seeds -- Reflux   Penicillins Itching   Percocet [Oxycodone-Acetaminophen] Nausea And Vomiting   Red Dye #40 (Allura Red) Other (See Comments)    Reflux   Senna-Docusate Sodium [Sennosides-Docusate Sodium] Other (See Comments)    Severe stomach pains   Shrimp (Diagnostic) Nausea Only    Stomach pain    Tomato Other (See Comments)    Reflux   Hm Lidocaine Patch [Lidocaine] Rash    Chief Complaint  Patient presents with   Follow-up    Follow up on constipation and other issues.    11 am    HPI: Patient is a 82 y.o. female  presented to clinic for acute visit for feeling dizzy and not able to sleep   Pt states that since she started drinking more water and feeling funny in her head She is drinking about 6-8 glasses of water  States that she is not able to sleep well for the past few days  She takes   1mg  klonopin  tablet  every night at bedtime  She is afraid that she is not sleeping well and that would trigger her seizures  Dizziness - since  few days  Denies vertigo Denies chest pain, palpitations, sob  She is able to speak in full sentences Able to walk from her apartment and go to  dining hall to eat her meals  GAD  Feels  worried all the time  On klonopin  Has no close family , all her family members died Feels sad      Review of Systems:  Review of Systems  Constitutional:  Negative for chills and fever.  HENT:  Negative for congestion and sore throat.   Eyes:  Negative for double vision.  Respiratory:  Negative for cough, sputum production and shortness of breath.   Cardiovascular:  Negative for chest pain, palpitations and leg swelling.  Gastrointestinal:  Negative for abdominal pain, heartburn and nausea.  Genitourinary:  Negative for dysuria, frequency and hematuria.  Musculoskeletal:  Negative for falls and myalgias.  Neurological:  Positive for dizziness. Negative for sensory change and focal weakness.  Psychiatric/Behavioral:  Positive for depression. Negative for suicidal ideas. The patient is nervous/anxious.    Negative unless indicated in HPI.   Past Medical History:  Diagnosis Date   Anxiety    Cataract    Chronic constipation    Depression    Osteoporosis    Seizures ([name])    epilepsy  last seizure 1977   Past Surgical History:  Procedure Laterality Date   ABDOMINAL HYSTERECTOMY  1995   Dr. Carlis Abbott   CATARACT EXTRACTION Bilateral 12/2016   COLONOSCOPY  2012   patchy increased intraepithelial lymphocytes - draelos  ESOPHAGOGASTRODUODENOSCOPY     multiple   FISSURECTOMY     INTRAMEDULLARY (IM) NAIL INTERTROCHANTERIC Left 05/09/2021   Procedure: INTRAMEDULLARY (IM) NAIL INTERTROCHANTRIC;  Surgeon: Cammy Copa, MD;  Location: [facility] OR;  Service: Orthopedics;  Laterality: Left;   IR KYPHO EA ADDL LEVEL THORACIC OR LUMBAR  06/26/2021   IR KYPHO THORACIC WITH BONE BIOPSY  06/26/2021   WRIST SURGERY Left    due to fracture   Social History:   reports that she quit smoking about 41 years ago. Her smoking use included cigarettes. She started smoking about 66 years ago. She has a 25 pack-year smoking history. She has never used smokeless tobacco. She reports that she does  not drink alcohol and does not use drugs.  Family History  Problem Relation Age of Onset   Alcohol abuse Father    Diabetes Father    Diabetes Maternal Grandmother    Diabetes Paternal Grandmother    Colon cancer Maternal Grandfather    Esophageal cancer Neg Hx    Rectal cancer Neg Hx    Stomach cancer Neg Hx     Medications: Patient's Medications  New Prescriptions   No medications on file  Previous Medications   ACETAMINOPHEN (TYLENOL) 500 MG TABLET    Take 1,000 mg by mouth every 6 (six) hours as needed.   ARTIFICIAL TEAR SOLUTION (SOOTHE XP) SOLN    Place 1 drop into both eyes 3 (three) times daily.   ATORVASTATIN (LIPITOR) 10 MG TABLET    TAKE (1/2) TABLET DAILY.   CALCIUM CARBONATE-VIT D-MIN (CALTRATE 600+D PLUS MINERALS) 600-800 MG-UNIT CHEW    Chew 2 each by mouth daily.   CLONAZEPAM (KLONOPIN) 1 MG TABLET    *BOTTLE* TAKE 1 TABLET BY MOUTH EVERY NIGHT AT BEDTIME *HAZARDOUS DRUG: WEAR GLOVES* *DO NOT CRUSH* [phone] CALL TO LET HER KNOW THE DAY WE COME   MULTIPLE VITAMINS-MINERALS (PRESERVISION AREDS PO)    Take 1 tablet by mouth 2 (two) times daily.   NUTRITIONAL SUPPLEMENTS (ENSURE MAX PROTEIN PO)    Take 1 Bottle by mouth every other day.   POLYETHYLENE GLYCOL POWDER (GLYCOLAX/MIRALAX) 17 GM/SCOOP POWDER    Take 17 g by mouth daily.   TEGRETOL 200 MG TABLET    TAKE 1 AND 1/2 TABLETS BY MOUTH AT BEDTIME   VITAMIN D, ERGOCALCIFEROL, 50000 UNITS CAPS    TAKE ONE CAPSULE BY MOUTH ONCE A WEEK  Modified Medications   No medications on file  Discontinued Medications   No medications on file    Physical Exam: Vitals:   01/09/23 1034  BP: 100/62  Pulse: 91  Resp: 16  Temp: 97.8 F (36.6 C)  SpO2: 99%  Weight: 103 lb 12.8 oz (47.1 kg)  Height: 5\' 5"  (1.651 m)   Body mass index is 17.27 kg/m. BP Readings from Last 3 Encounters:  01/09/23 100/62  01/02/23 122/78  12/11/22 104/68   Wt Readings from Last 3 Encounters:  01/09/23 103 lb 12.8 oz (47.1 kg)   01/02/23 110 lb (49.9 kg)  12/11/22 107 lb (48.5 kg)    Physical Exam Constitutional:      Appearance: Normal appearance.  HENT:     Head: Normocephalic and atraumatic.  Cardiovascular:     Rate and Rhythm: Normal rate and regular rhythm.     Heart sounds: No murmur heard. Pulmonary:     Effort: Pulmonary effort is normal. No respiratory distress.     Breath sounds: Normal breath sounds. No wheezing.  Abdominal:  General: Bowel sounds are normal. There is no distension.     Tenderness: There is no abdominal tenderness. There is no guarding or rebound.  Musculoskeletal:        General: No swelling or tenderness.  Skin:    General: Skin is dry.  Neurological:     Mental Status: She is alert. Mental status is at baseline.     Sensory: No sensory deficit.     Motor: No weakness.     Comments: Gait normal  Rombergs negative  Strength and sensations intact Finger nose negative     Labs reviewed: Basic Metabolic Panel: Recent Labs    06/03/22 0718 11/11/22 0725  NA 144 142  K 4.0 4.2  CL 106 104  CO2 29 30  GLUCOSE 111* 93  BUN 18 20  CREATININE 0.63 0.63  CALCIUM 9.3 9.5  TSH 3.40  --    Liver Function Tests: Recent Labs    06/03/22 0718 11/11/22 0725  AST 26 22  ALT 20 19  BILITOT 0.6 0.6  PROT 6.3 6.3   No results for input(s): "LIPASE", "AMYLASE" in the last 8760 hours. No results for input(s): "AMMONIA" in the last 8760 hours. CBC: Recent Labs    06/03/22 0718  WBC 4.6  NEUTROABS 2,571  HGB 13.3  HCT 40.9  MCV 96.5  PLT 227   Lipid Panel: Recent Labs    06/03/22 0718  CHOL 153  HDL 51  LDLCALC 81  TRIG 112  CHOLHDL 3.0   TSH: Recent Labs    06/03/22 0718  TSH 3.40   A1C: Lab Results  Component Value Date   HGBA1C 6.2 (H) 06/03/2022     Assessment/Plan 1. Moderate episode of recurrent major depressive disorder ([name]) Phq9- 10 Gad  12 Will start low dose effexor Follow in 4-6 weeks - venlafaxine XR (EFFEXOR XR) 37.5  MG 24 hr capsule; Take 1 capsule (37.5 mg total) by mouth daily with breakfast.  Dispense: 90 capsule; Refill: 1  2. Current moderate episode of major depressive disorder without prior episode ([name]) Will start effexor   3. Primary insomnia Discussed regarding sleep hygiene Take melatonin at bed time   4. Dizziness Ortho stasis  Will check labs  - CBC (no diff) - Basic Metabolic Panel with eGFR   No follow-ups on file.:    30 Total time spent for obtaining history,  performing a medically appropriate examination and evaluation, reviewing the tests, counseling and educating the patient/family/caregiver, ordering medications,   , documenting clinical information in the electronic or other health record, independently interpreting results and communicating results to the patient/family/caregiver, care coordination (not separately reported)

## 2023-01-13 ENCOUNTER — Other Ambulatory Visit: Payer: Medicare Other

## 2023-01-13 DIAGNOSIS — R42 Dizziness and giddiness: Secondary | ICD-10-CM | POA: Diagnosis not present

## 2023-01-13 LAB — BASIC METABOLIC PANEL WITH GFR
BUN: 19 mg/dL (ref 7–25)
CO2: 28 mmol/L (ref 20–32)
Calcium: 9.4 mg/dL (ref 8.6–10.4)
Chloride: 103 mmol/L (ref 98–110)
Creat: 0.67 mg/dL (ref 0.60–0.95)
Glucose, Bld: 110 mg/dL — ABNORMAL HIGH (ref 65–99)
Potassium: 3.8 mmol/L (ref 3.5–5.3)
Sodium: 141 mmol/L (ref 135–146)
eGFR: 87 mL/min/{1.73_m2} (ref >=60)

## 2023-01-13 LAB — CBC
HCT: 41.8 % (ref 35.0–45.0)
Hemoglobin: 14 g/dL (ref 11.7–15.5)
MCH: 33.4 pg — ABNORMAL HIGH (ref 27.0–33.0)
MCHC: 33.5 g/dL (ref 32.0–36.0)
MCV: 99.8 fL (ref 80.0–100.0)
MPV: 11.2 fL (ref 7.5–12.5)
Platelets: 207 10*3/uL (ref 140–400)
RBC: 4.19 10*6/uL (ref 3.80–5.10)
RDW: 12.5 % (ref 11.0–15.0)
WBC: 5.4 10*3/uL (ref 3.8–10.8)

## 2023-02-05 ENCOUNTER — Non-Acute Institutional Stay: Payer: Medicare Other | Admitting: Nurse Practitioner

## 2023-02-05 ENCOUNTER — Encounter: Payer: Self-pay | Admitting: Nurse Practitioner

## 2023-02-05 VITALS — BP 112/68 | HR 79 | Temp 97.9°F | Resp 18 | Ht 65.0 in | Wt 103.8 lb

## 2023-02-05 DIAGNOSIS — R7303 Prediabetes: Secondary | ICD-10-CM

## 2023-02-05 DIAGNOSIS — F5101 Primary insomnia: Secondary | ICD-10-CM | POA: Diagnosis not present

## 2023-02-05 DIAGNOSIS — D500 Iron deficiency anemia secondary to blood loss (chronic): Secondary | ICD-10-CM

## 2023-02-05 DIAGNOSIS — E785 Hyperlipidemia, unspecified: Secondary | ICD-10-CM

## 2023-02-05 DIAGNOSIS — K219 Gastro-esophageal reflux disease without esophagitis: Secondary | ICD-10-CM | POA: Diagnosis not present

## 2023-02-05 DIAGNOSIS — F419 Anxiety disorder, unspecified: Secondary | ICD-10-CM | POA: Diagnosis not present

## 2023-02-05 DIAGNOSIS — M810 Age-related osteoporosis without current pathological fracture: Secondary | ICD-10-CM | POA: Diagnosis not present

## 2023-02-05 DIAGNOSIS — G40822 Epileptic spasms, not intractable, without status epilepticus: Secondary | ICD-10-CM

## 2023-02-05 DIAGNOSIS — K5904 Chronic idiopathic constipation: Secondary | ICD-10-CM

## 2023-02-05 NOTE — Assessment & Plan Note (Signed)
Anxious and sad feelings during holidays or acute medical situations, denied depression, declined Effexor use to avoid possible side effects

## 2023-02-05 NOTE — Progress Notes (Addendum)
Location:   Clinic [facility]   Place of Service:  Clinic (12) Provider: Chipper Oman NP  Code Status: DNR Goals of Care:     02/05/2023    2:40 PM  Advanced Directives  Does Patient Have a Medical Advance Directive? Yes  Type of Estate agent of [place];Living will  Does patient want to make changes to medical advance directive? No - Patient declined  Copy of Healthcare Power of Attorney in Chart? Yes - validated most recent copy scanned in chart (See row information)     Chief Complaint  Patient presents with   Medical Management of Chronic Issues    4 weel follow up and discuss Hemoglobin A1C, eye exam,and covid vaccine.    HPI: Patient is a 82 y.o. female seen today for   OA, s/p left hip surgery, s/p Kyphplasty             Dizziness: better, MRI brain 06/25/21, 1. No acute finding, including infarct. 2. Generalized brain atrophy.              Post op anemia, Hgb 14.0 01/13/23             Seizures, no active seizures since last seen, failed generic Tegretol in the past, on Tegretol 200mg  qd, Clonazepam hs (GDR to stop on her own didn't cause active seizures, but flare up insomnia and anxiety). S/p Neurology              Constipation, stable, on MiraLax, Senokot S II bid.              Hyperlipidemia, takes Atorvastatin 5mg  qd, LDL 81 06/13/22             Prediabetic, Hgb a1c 5.7 04/25/21<<6.2 06/03/22, lifestyle modification ACR 5 11/11/22, Bun/creat 19/0.67 01/13/23             OP takes Reclast, t-score -3.6 09/13/19, -3.5 05/12/22             Insomnia/anxiety, takes Clonazepam, failed GDR,  failed Ambien. TSH 3.4 06/03/22             Erosive gastropathy, off PPI   Past Medical History:  Diagnosis Date   Anxiety    Cataract    Chronic constipation    Depression    Osteoporosis    Seizures ([name])    epilepsy  last seizure 1977    Past Surgical History:  Procedure Laterality Date   ABDOMINAL HYSTERECTOMY  1995   Dr. Carlis Abbott   CATARACT EXTRACTION  Bilateral 12/2016   COLONOSCOPY  2012   patchy increased intraepithelial lymphocytes - draelos   ESOPHAGOGASTRODUODENOSCOPY     multiple   FISSURECTOMY     INTRAMEDULLARY (IM) NAIL INTERTROCHANTERIC Left 05/09/2021   Procedure: INTRAMEDULLARY (IM) NAIL INTERTROCHANTRIC;  Surgeon: Cammy Copa, MD;  Location: [facility] OR;  Service: Orthopedics;  Laterality: Left;   IR KYPHO EA ADDL LEVEL THORACIC OR LUMBAR  06/26/2021   IR KYPHO THORACIC WITH BONE BIOPSY  06/26/2021   WRIST SURGERY Left    due to fracture    Allergies  Allergen Reactions   Cat Hair Extract Other (See Comments)    Patient states that it causes her eyes to swell shut   Cefaclor Other (See Comments)    Unknown reaction   Codeine Nausea Only   Fish Allergy Nausea Only   Iodinated Contrast Media Other (See Comments)    Unknown reaction     Other Other (See Comments)  Seeds -- Reflux   Penicillins Itching   Percocet [Oxycodone-Acetaminophen] Nausea And Vomiting   Red Dye #40 (Allura Red) Other (See Comments)    Reflux   Senna-Docusate Sodium [Sennosides-Docusate Sodium] Other (See Comments)    Severe stomach pains   Shrimp (Diagnostic) Nausea Only    Stomach pain    Tomato Other (See Comments)    Reflux   Hm Lidocaine Patch [Lidocaine] Rash    Allergies as of 02/05/2023       Reactions   Cat Hair Extract Other (See Comments)   Patient states that it causes her eyes to swell shut   Cefaclor Other (See Comments)   Unknown reaction   Codeine Nausea Only   Fish Allergy Nausea Only   Iodinated Contrast Media Other (See Comments)   Unknown reaction   Other Other (See Comments)   Seeds -- Reflux   Penicillins Itching   Percocet [oxycodone-acetaminophen] Nausea And Vomiting   Red Dye #40 (allura Red) Other (See Comments)   Reflux   Senna-docusate Sodium [sennosides-docusate Sodium] Other (See Comments)   Severe stomach pains   Shrimp (diagnostic) Nausea Only   Stomach pain   Tomato Other (See Comments)    Reflux   Hm Lidocaine Patch [lidocaine] Rash        Medication List        Accurate as of February 05, 2023  4:23 PM. If you have any questions, ask your nurse or doctor.          acetaminophen 500 MG tablet Commonly known as: TYLENOL Take 1,000 mg by mouth every 6 (six) hours as needed.   atorvastatin 10 MG tablet Commonly known as: LIPITOR TAKE (1/2) TABLET DAILY.   Caltrate 600+D Plus Minerals 600-800 MG-UNIT Chew Chew 2 each by mouth daily.   clonazePAM 1 MG tablet Commonly known as: KLONOPIN *BOTTLE* TAKE 1 TABLET BY MOUTH EVERY NIGHT AT BEDTIME *HAZARDOUS DRUG: WEAR GLOVES* *DO NOT CRUSH* [phone] CALL TO LET HER KNOW THE DAY WE COME   ENSURE MAX PROTEIN PO Take 1 Bottle by mouth every other day.   polyethylene glycol powder 17 GM/SCOOP powder Commonly known as: GLYCOLAX/MIRALAX Take 17 g by mouth daily.   PRESERVISION AREDS PO Take 1 tablet by mouth 2 (two) times daily.   Soothe XP Soln Place 1 drop into both eyes 3 (three) times daily.   TEGretol 200 MG tablet Generic drug: carbamazepine TAKE 1 AND 1/2 TABLETS BY MOUTH AT BEDTIME   venlafaxine XR 37.5 MG 24 hr capsule Commonly known as: Effexor XR Take 1 capsule (37.5 mg total) by mouth daily with breakfast.   Vitamin D (Ergocalciferol) 50000 units Caps TAKE ONE CAPSULE BY MOUTH ONCE A WEEK        Review of Systems:  Review of Systems  Constitutional:  Negative for appetite change, fatigue and fever.  HENT:  Positive for hearing loss. Negative for congestion and voice change.   Eyes:  Negative for visual disturbance.  Respiratory:  Negative for shortness of breath.   Cardiovascular:  Negative for leg swelling.  Gastrointestinal:  Negative for abdominal pain and constipation.  Genitourinary:  Positive for frequency. Negative for dysuria and urgency.       The patient stated she pushes her suprapubic region to help emptying her bladder  Musculoskeletal:  Positive for arthralgias, back  pain and gait problem.       Left hip pain when walking, better.   Skin:  Negative for color change.  Left hip surgical incision is healed.   Neurological:  Positive for numbness. Negative for seizures and weakness.       Tingling/numbness left foot sometimes. Occasionally left hip/leg pain sometimes. Lightheaded when not sleeping well in lifetime.   Psychiatric/Behavioral:  Negative for behavioral problems and sleep disturbance. The patient is not nervous/anxious.        Feels lightheaded if not sleep well at night.    Health Maintenance  Topic Date Due   OPHTHALMOLOGY EXAM  05/07/2022   HEMOGLOBIN A1C  12/03/2022   COVID-19 Vaccine (8 - 2024-25 season) 01/28/2023   FOOT EXAM  11/06/2023   Medicare Annual Wellness (AWV)  11/06/2023   Diabetic kidney evaluation - Urine ACR  11/11/2023   Diabetic kidney evaluation - eGFR measurement  01/13/2024   MAMMOGRAM  05/11/2024   DTaP/Tdap/Td (2 - Td or Tdap) 08/21/2027   Pneumonia Vaccine 34+ Years old  Completed   INFLUENZA VACCINE  Completed   DEXA SCAN  Completed   Zoster Vaccines- Shingrix  Completed   HPV VACCINES  Aged Out    Physical Exam: Vitals:   02/05/23 1444  BP: 112/68  Pulse: 79  Resp: 18  Temp: 97.9 F (36.6 C)  SpO2: 96%  Weight: 103 lb 12.8 oz (47.1 kg)  Height: 5\' 5"  (1.651 m)   Body mass index is 17.27 kg/m. Physical Exam Vitals and nursing note reviewed.  Constitutional:      Appearance: Normal appearance.  HENT:     Head: Normocephalic and atraumatic.     Mouth/Throat:     Mouth: Mucous membranes are moist.  Eyes:     Extraocular Movements: Extraocular movements intact.     Conjunctiva/sclera: Conjunctivae normal.     Pupils: Pupils are equal, round, and reactive to light.  Cardiovascular:     Rate and Rhythm: Normal rate and regular rhythm.     Heart sounds: No murmur heard. Pulmonary:     Effort: Pulmonary effort is normal.     Breath sounds: No rales.  Abdominal:     General: Bowel  sounds are normal.     Palpations: Abdomen is soft.     Tenderness: There is no abdominal tenderness.  Musculoskeletal:     Cervical back: Normal range of motion and neck supple.     Right lower leg: No edema.     Left lower leg: No edema.     Comments: S/p left hip ORIF.   Skin:    General: Skin is warm and dry.     Comments: Left hip surgical scar  Neurological:     General: No focal deficit present.     Mental Status: She is alert and oriented to person, place, and time. Mental status is at baseline.     Gait: Gait abnormal.  Psychiatric:        Mood and Affect: Mood normal.        Behavior: Behavior normal.        Thought Content: Thought content normal.        Judgment: Judgment normal.     Comments: Stated she feels sad during holidays, but no change of her participation in playing cards, dinning in dinning room with others, taking [facility] bus for shopping, singing programs, etc.      Labs reviewed: Basic Metabolic Panel: Recent Labs    06/03/22 0718 11/11/22 0725 01/13/23 0725  NA 144 142 141  K 4.0 4.2 3.8  CL 106 104 103  CO2 29 30 28  GLUCOSE 111* 93 110*  BUN 18 20 19   CREATININE 0.63 0.63 0.67  CALCIUM 9.3 9.5 9.4  TSH 3.40  --   --    Liver Function Tests: Recent Labs    06/03/22 0718 11/11/22 0725  AST 26 22  ALT 20 19  BILITOT 0.6 0.6  PROT 6.3 6.3   No results for input(s): "LIPASE", "AMYLASE" in the last 8760 hours. No results for input(s): "AMMONIA" in the last 8760 hours. CBC: Recent Labs    06/03/22 0718 01/13/23 0725  WBC 4.6 5.4  NEUTROABS 2,571  --   HGB 13.3 14.0  HCT 40.9 41.8  MCV 96.5 99.8  PLT 227 207   Lipid Panel: Recent Labs    06/03/22 0718  CHOL 153  HDL 51  LDLCALC 81  TRIG 112  CHOLHDL 3.0   Lab Results  Component Value Date   HGBA1C 6.2 (H) 06/03/2022    Procedures since last visit: No results found.  Assessment/Plan  Gastroesophageal reflux disease without esophagitis  off PPI  Insomnia takes  Clonazepam, failed GDR,  failed Ambien. TSH 3.4 06/03/22  Osteoporosis takes Reclast, t-score -3.6 09/13/19, -3.5 05/12/22  Pre-diabetes Hgb a1c 5.7 04/25/21<<6.2 06/03/22, lifestyle modification ACR 5 11/11/22, Bun/creat 19/0.67 01/13/23  Hyperlipidemia  takes Atorvastatin 5mg  qd, LDL 81 06/13/22  Constipation  stable, on MiraLax, Senokot S II bid.   Epilepsy without status epilepticus, not intractable ([name])  no active seizures since last seen, failed generic Tegretol in the past, on Tegretol 200mg  qd, Clonazepam hs (GDR to stop on her own didn't cause active seizures, but flare up insomnia and anxiety). S/p Neurology   Blood loss anemia Hgb 14.0 01/13/23  Anxiety Anxious and sad feelings during holidays or acute medical situations, denied depression, declined Effexor use to avoid possible side effects   Labs/tests ordered:  none  Next appt:  05/19/2023

## 2023-02-05 NOTE — Assessment & Plan Note (Signed)
stable, on MiraLax, Senokot S II bid.

## 2023-02-05 NOTE — Assessment & Plan Note (Signed)
off PPI

## 2023-02-05 NOTE — Assessment & Plan Note (Signed)
Hgb a1c 5.7 04/25/21<<6.2 06/03/22, lifestyle modification ACR 5 11/11/22, Bun/creat 19/0.67 01/13/23

## 2023-02-05 NOTE — Assessment & Plan Note (Signed)
no active seizures since last seen, failed generic Tegretol in the past, on Tegretol 200mg  qd, Clonazepam hs (GDR to stop on her own didn't cause active seizures, but flare up insomnia and anxiety). S/p Neurology

## 2023-02-05 NOTE — Assessment & Plan Note (Signed)
takes Clonazepam, failed GDR,  failed Ambien. TSH 3.4 06/03/22

## 2023-02-05 NOTE — Assessment & Plan Note (Signed)
Hgb 14.0 01/13/23

## 2023-02-05 NOTE — Assessment & Plan Note (Signed)
takes Atorvastatin 5mg  qd, LDL 81 06/13/22

## 2023-02-05 NOTE — Assessment & Plan Note (Signed)
takes Reclast, t-score -3.6 09/13/19, -3.5 05/12/22

## 2023-03-26 ENCOUNTER — Encounter: Payer: Medicare Other | Admitting: Nurse Practitioner

## 2023-04-15 ENCOUNTER — Other Ambulatory Visit: Payer: Self-pay | Admitting: Nurse Practitioner

## 2023-04-15 DIAGNOSIS — E785 Hyperlipidemia, unspecified: Secondary | ICD-10-CM

## 2023-04-16 ENCOUNTER — Other Ambulatory Visit: Payer: Self-pay | Admitting: Nurse Practitioner

## 2023-04-16 DIAGNOSIS — G4700 Insomnia, unspecified: Secondary | ICD-10-CM

## 2023-04-16 NOTE — Telephone Encounter (Signed)
 Patient is requesting a refill of the following medications: Requested Prescriptions   Pending Prescriptions Disp Refills   clonazePAM (KLONOPIN) 1 MG tablet [Pharmacy Med Name: clonazePAM 1 MG Tablet] 30 tablet 5    Sig: *BOTTLE* TAKE 1 TABLET BY MOUTH EVERY NIGHT AT BEDTIME *HAZARDOUS DRUG: WEAR GLOVES* *DO NOT CRUSH* [phone] CALL TO LET HER KNOW THE DAY WE COME    Date of last refill: 11/18/22  Refill amount: 30/4 additional   Treatment agreement date: 12/11/22

## 2023-04-23 ENCOUNTER — Other Ambulatory Visit: Payer: Self-pay

## 2023-04-23 ENCOUNTER — Emergency Department (HOSPITAL_COMMUNITY)
Admission: EM | Admit: 2023-04-23 | Discharge: 2023-04-23 | Disposition: A | Discharge status: Home / Self Care | Attending: Emergency Medicine | Admitting: Emergency Medicine

## 2023-04-23 ENCOUNTER — Emergency Department (HOSPITAL_COMMUNITY)

## 2023-04-23 DIAGNOSIS — W19XXXA Unspecified fall, initial encounter: Secondary | ICD-10-CM | POA: Insufficient documentation

## 2023-04-23 DIAGNOSIS — E041 Nontoxic single thyroid nodule: Secondary | ICD-10-CM | POA: Diagnosis not present

## 2023-04-23 DIAGNOSIS — R569 Unspecified convulsions: Secondary | ICD-10-CM | POA: Diagnosis not present

## 2023-04-23 DIAGNOSIS — R220 Localized swelling, mass and lump, head: Secondary | ICD-10-CM | POA: Diagnosis not present

## 2023-04-23 DIAGNOSIS — G40909 Epilepsy, unspecified, not intractable, without status epilepticus: Secondary | ICD-10-CM | POA: Diagnosis not present

## 2023-04-23 DIAGNOSIS — R799 Abnormal finding of blood chemistry, unspecified: Secondary | ICD-10-CM | POA: Diagnosis not present

## 2023-04-23 DIAGNOSIS — S0101XA Laceration without foreign body of scalp, initial encounter: Secondary | ICD-10-CM | POA: Diagnosis not present

## 2023-04-23 DIAGNOSIS — R58 Hemorrhage, not elsewhere classified: Secondary | ICD-10-CM | POA: Diagnosis not present

## 2023-04-23 DIAGNOSIS — S0990XA Unspecified injury of head, initial encounter: Secondary | ICD-10-CM | POA: Diagnosis not present

## 2023-04-23 DIAGNOSIS — Z7401 Bed confinement status: Secondary | ICD-10-CM | POA: Diagnosis not present

## 2023-04-23 LAB — CBC WITH DIFFERENTIAL/PLATELET
Abs Immature Granulocytes: 0.03 10*3/uL (ref 0.00–0.07)
Basophils Absolute: 0 10*3/uL (ref 0.0–0.1)
Basophils Relative: 0 %
Eosinophils Absolute: 0 10*3/uL (ref 0.0–0.5)
Eosinophils Relative: 0 %
HCT: 41.6 % (ref 36.0–46.0)
Hemoglobin: 13.8 g/dL (ref 12.0–15.0)
Immature Granulocytes: 0 %
Lymphocytes Relative: 10 %
Lymphs Abs: 0.9 10*3/uL (ref 0.7–4.0)
MCH: 33.7 pg (ref 26.0–34.0)
MCHC: 33.2 g/dL (ref 30.0–36.0)
MCV: 101.5 fL — ABNORMAL HIGH (ref 80.0–100.0)
Monocytes Absolute: 0.8 10*3/uL (ref 0.1–1.0)
Monocytes Relative: 8 %
Neutro Abs: 7.3 10*3/uL (ref 1.7–7.7)
Neutrophils Relative %: 82 %
Platelets: 177 10*3/uL (ref 150–400)
RBC: 4.1 MIL/uL (ref 3.87–5.11)
RDW: 13.4 % (ref 11.5–15.5)
WBC: 9.1 10*3/uL (ref 4.0–10.5)
nRBC: 0 % (ref 0.0–0.2)

## 2023-04-23 LAB — COMPREHENSIVE METABOLIC PANEL
ALT: 25 U/L (ref 0–44)
AST: 29 U/L (ref 15–41)
Albumin: 4.3 g/dL (ref 3.5–5.0)
Alkaline Phosphatase: 39 U/L (ref 38–126)
Anion gap: 10 (ref 5–15)
BUN: 26 mg/dL — ABNORMAL HIGH (ref 8–23)
CO2: 25 mmol/L (ref 22–32)
Calcium: 9.6 mg/dL (ref 8.9–10.3)
Chloride: 105 mmol/L (ref 98–111)
Creatinine, Ser: 0.55 mg/dL (ref 0.44–1.00)
GFR, Estimated: >60 mL/min (ref >60)
Glucose, Bld: 108 mg/dL — ABNORMAL HIGH (ref 70–99)
Potassium: 4.1 mmol/L (ref 3.5–5.1)
Sodium: 140 mmol/L (ref 135–145)
Total Bilirubin: 0.5 mg/dL (ref 0.0–1.2)
Total Protein: 6.6 g/dL (ref 6.5–8.1)

## 2023-04-23 LAB — CBG MONITORING, ED: Glucose-Capillary: 110 mg/dL — ABNORMAL HIGH (ref 70–99)

## 2023-04-23 LAB — CARBAMAZEPINE LEVEL, TOTAL: Carbamazepine Lvl: 5.2 ug/mL (ref 4.0–12.0)

## 2023-04-23 MED ORDER — LIDOCAINE-EPINEPHRINE-TETRACAINE (LET) TOPICAL GEL
3.0000 mL | Freq: Once | TOPICAL | Status: AC
  Administered 2023-04-23: 3 mL via TOPICAL
  Filled 2023-04-23: qty 3

## 2023-04-23 MED ORDER — CARBAMAZEPINE 100 MG PO CHEW
300.0000 mg | CHEWABLE_TABLET | Freq: Once | ORAL | Status: DC
  Filled 2023-04-23: qty 3

## 2023-04-23 NOTE — Discharge Instructions (Addendum)
 You can take 1 and half of the clonazepam tablets tonight only.  Discussed with your primary care doctor further management of this.  Keep your wound clean and dry.  The staples need to be removed in about 1 week.  This should be able to be done by your primary care doctor.  Follow-up with a neurologist.  Return to emergency room if you have any worsening symptoms.  You have a slight increase in your thyroid nodule.  Follow-up with your primary care doctor regarding this.

## 2023-04-23 NOTE — ED Provider Notes (Signed)
 [place] EMERGENCY DEPARTMENT AT [facility] Provider Note   CSN: [id number] Arrival date & time: 04/23/23  1200     History  Chief Complaint  Patient presents with  . Dizziness  . Fall  . Head Injury    Shronda Boeh is a 83 y.o. female.  Patient is a 83 year old female who presents after having a seizure.  She has a history of seizures that she has had since she was 84 years old.  They are typically well-controlled with Tegretol.  She states the only other time that she is had a seizure was when she has not been sleeping well and that was about 40 years ago.  She said today she was in the kitchen and felt an onset of a dizzy feeling that she has had previously prior to having her seizures.  She ended up on the floor and sustained a laceration to the back of her head.  She was incontinent of urine.  After it happened, she called someone at [facility] where she lives and they called EMS.  She has not had any further seizure activity.  She does take clonazepam 1 mg tablets at night.  She says over the last few days she has not been sleeping well.  She denies any other recent illnesses.  No fevers.  No cough or cold symptoms.  No urinary symptoms.  She denies any other injuries from the seizure other than the laceration to the back of her head.  She states her tetanus shot is up-to-date.      Home Medications Prior to Admission medications   Medication Sig Start Date End Date Taking? Authorizing Provider  acetaminophen (TYLENOL) 500 MG tablet Take 1,000 mg by mouth every 6 (six) hours as needed.    [provider]  Artificial Tear Solution (SOOTHE XP) SOLN Place 1 drop into both eyes 3 (three) times daily.    [provider]  atorvastatin (LIPITOR) 10 MG tablet TAKE 1/2 TABLET DAILY 04/15/23   Mast, Man X, NP  Calcium Carbonate-Vit D-Min (CALTRATE 600+D PLUS MINERALS) 600-800 MG-UNIT CHEW Chew 2 each by mouth daily.    [provider]   clonazePAM (KLONOPIN) 1 MG tablet *BOTTLE* TAKE 1 TABLET BY MOUTH EVERY NIGHT AT BEDTIME *HAZARDOUS DRUG: WEAR GLOVES* *DO NOT CRUSH* [phone] CALL TO LET HER KNOW THE DAY WE COME 04/16/23   Mast, Man X, NP  Multiple Vitamins-Minerals (PRESERVISION AREDS PO) Take 1 tablet by mouth 2 (two) times daily.    [provider]  Nutritional Supplements (ENSURE MAX PROTEIN PO) Take 1 Bottle by mouth every other day.    [provider]  polyethylene glycol powder (GLYCOLAX/MIRALAX) 17 GM/SCOOP powder Take 17 g by mouth daily.    [provider]  TEGRETOL 200 MG tablet TAKE 1 AND 1/2 TABLETS BY MOUTH AT BEDTIME 12/25/22   Mast, Man X, NP  venlafaxine XR (EFFEXOR XR) 37.5 MG 24 hr capsule Take 1 capsule (37.5 mg total) by mouth daily with breakfast. Patient not taking: Reported on 02/05/2023 01/09/23   [name], MD  Vitamin D, Ergocalciferol, 50000 units CAPS TAKE ONE CAPSULE BY MOUTH ONCE A WEEK 11/04/22   Mast, Man X, NP      Allergies    Cat dander, Cefaclor, Codeine, Fish allergy, Iodinated contrast media, Other, Penicillins, Percocet [oxycodone-acetaminophen], Red dye #40 (allura red), Senna-docusate sodium [sennosides-docusate sodium], Shrimp (diagnostic), Tomato, and Hm lidocaine patch [lidocaine]    Review of Systems   Review  of Systems  Constitutional:  Negative for chills, diaphoresis, fatigue and fever.  HENT:  Negative for congestion, rhinorrhea and sneezing.   Eyes: Negative.   Respiratory:  Negative for cough, chest tightness and shortness of breath.   Cardiovascular:  Negative for chest pain and leg swelling.  Gastrointestinal:  Negative for abdominal pain, blood in stool, diarrhea, nausea and vomiting.  Genitourinary:  Negative for difficulty urinating, flank pain, frequency and hematuria.  Musculoskeletal:  Negative for arthralgias and back pain.  Skin:  Positive for wound. Negative for rash.  Neurological:  Positive for seizures. Negative for  dizziness, speech difficulty, weakness, numbness and headaches.    Physical Exam Updated Vital Signs BP (!) 120/49   Pulse 77   Temp 98.2 F (36.8 C) (Oral)   Resp 16   Ht 5\' 5"  (1.651 m)   Wt 45.4 kg   SpO2 99%   BMI 16.64 kg/m  Physical Exam Constitutional:      Appearance: She is well-developed.  HENT:     Head: Normocephalic.     Comments: 1.5 cm laceration to the posterior scalp, no active bleeding Eyes:     Pupils: Pupils are equal, round, and reactive to light.  Neck:     Comments: No pain to the cervical, thoracic or lumbosacral spine Cardiovascular:     Rate and Rhythm: Normal rate and regular rhythm.     Heart sounds: Normal heart sounds.  Pulmonary:     Effort: Pulmonary effort is normal. No respiratory distress.     Breath sounds: Normal breath sounds. No wheezing or rales.  Chest:     Chest wall: No tenderness.  Abdominal:     General: Bowel sounds are normal.     Palpations: Abdomen is soft.     Tenderness: There is no abdominal tenderness. There is no guarding or rebound.  Musculoskeletal:        General: Normal range of motion.  Lymphadenopathy:     Cervical: No cervical adenopathy.  Skin:    General: Skin is warm and dry.     Findings: No rash.  Neurological:     General: No focal deficit present.     Mental Status: She is alert and oriented to person, place, and time.    ED Results / Procedures / Treatments   Labs (all labs ordered are listed, but only abnormal results are displayed) Labs Reviewed  CBC WITH DIFFERENTIAL/PLATELET - Abnormal; Notable for the following components:      Result Value   MCV 101.5 (*)    All other components within normal limits  COMPREHENSIVE METABOLIC PANEL - Abnormal; Notable for the following components:   Glucose, Bld 108 (*)    BUN 26 (*)    All other components within normal limits  CBG MONITORING, ED - Abnormal; Notable for the following components:   Glucose-Capillary 110 (*)    All other components  within normal limits  CARBAMAZEPINE LEVEL, TOTAL    EKG None  Radiology CT Head Wo Contrast Result Date: 04/23/2023 CLINICAL DATA:  Seizure, fall with head trauma. EXAM: CT HEAD WITHOUT CONTRAST CT CERVICAL SPINE WITHOUT CONTRAST TECHNIQUE: Multidetector CT imaging of the head and cervical spine was performed following the standard protocol without intravenous contrast. Multiplanar CT image reconstructions of the cervical spine were also generated. RADIATION DOSE REDUCTION: This exam was performed according to the departmental dose-optimization program which includes automated exposure control, adjustment of the mA and/or kV according to patient size and/or use of iterative reconstruction  technique. COMPARISON:  06/21/2021 FINDINGS: CT HEAD FINDINGS Brain: No acute intracranial hemorrhage. No CT evidence of acute infarct. No edema, mass effect, or midline shift. The basilar cisterns are patent. Ventricles: Prominence of the ventricles suggesting underlying parenchymal volume loss. Vascular: No hyperdense vessel or unexpected calcification. Skull: No acute or aggressive finding. Orbits: Orbits are symmetric. Sinuses: The visualized paranasal sinuses are clear. Other: Laceration and mild soft tissue swelling in the right parieto-occipital scalp. Mastoid air cells are clear CT CERVICAL SPINE FINDINGS Alignment: Cervical lordosis is maintained. Trace retrolisthesis of C5 on C6. No facet subluxation or dislocation. Skull base and vertebrae: No compression fracture or displaced fracture in the cervical spine. No suspicious osseous lesion. Soft tissues and spinal canal: No prevertebral fluid or swelling. No visible canal hematoma. 2.0 cm nodule involving the inferior aspect of the right thyroid lobe and isthmus is present since 2011 at which time it measured 1.7 cm. Disc levels: Moderate to severe disc space narrowing at C5-6. Disc bulges at multiple levels without high-grade spinal canal stenosis. Disc osteophyte  complex at C5-6 without high-grade spinal canal stenosis. Facet arthrosis greatest on the left at C4-5. Upper chest: Biapical pleuroparenchymal scarring and pleural calcifications. Other: None. IMPRESSION: 1. No acute intracranial abnormality. 2. Laceration and mild soft tissue swelling in the right parieto-occipital scalp. 3. No acute fracture or traumatic malalignment in the cervical spine. 4. Multilevel degenerative changes of the cervical spine, as described above. 5. 2.0 cm nodule involving the inferior aspect of the right thyroid lobe and isthmus, slightly increased in size from 2011. Consider thyroid ultrasound for further evaluation if not previously performed. Electronically Signed   By: Emily Filbert M.D.   On: 04/23/2023 13:28   CT Cervical Spine Wo Contrast Result Date: 04/23/2023 CLINICAL DATA:  Seizure, fall with head trauma. EXAM: CT HEAD WITHOUT CONTRAST CT CERVICAL SPINE WITHOUT CONTRAST TECHNIQUE: Multidetector CT imaging of the head and cervical spine was performed following the standard protocol without intravenous contrast. Multiplanar CT image reconstructions of the cervical spine were also generated. RADIATION DOSE REDUCTION: This exam was performed according to the departmental dose-optimization program which includes automated exposure control, adjustment of the mA and/or kV according to patient size and/or use of iterative reconstruction technique. COMPARISON:  06/21/2021 FINDINGS: CT HEAD FINDINGS Brain: No acute intracranial hemorrhage. No CT evidence of acute infarct. No edema, mass effect, or midline shift. The basilar cisterns are patent. Ventricles: Prominence of the ventricles suggesting underlying parenchymal volume loss. Vascular: No hyperdense vessel or unexpected calcification. Skull: No acute or aggressive finding. Orbits: Orbits are symmetric. Sinuses: The visualized paranasal sinuses are clear. Other: Laceration and mild soft tissue swelling in the right parieto-occipital  scalp. Mastoid air cells are clear CT CERVICAL SPINE FINDINGS Alignment: Cervical lordosis is maintained. Trace retrolisthesis of C5 on C6. No facet subluxation or dislocation. Skull base and vertebrae: No compression fracture or displaced fracture in the cervical spine. No suspicious osseous lesion. Soft tissues and spinal canal: No prevertebral fluid or swelling. No visible canal hematoma. 2.0 cm nodule involving the inferior aspect of the right thyroid lobe and isthmus is present since 2011 at which time it measured 1.7 cm. Disc levels: Moderate to severe disc space narrowing at C5-6. Disc bulges at multiple levels without high-grade spinal canal stenosis. Disc osteophyte complex at C5-6 without high-grade spinal canal stenosis. Facet arthrosis greatest on the left at C4-5. Upper chest: Biapical pleuroparenchymal scarring and pleural calcifications. Other: None. IMPRESSION: 1. No acute intracranial abnormality. 2. Laceration  and mild soft tissue swelling in the right parieto-occipital scalp. 3. No acute fracture or traumatic malalignment in the cervical spine. 4. Multilevel degenerative changes of the cervical spine, as described above. 5. 2.0 cm nodule involving the inferior aspect of the right thyroid lobe and isthmus, slightly increased in size from 2011. Consider thyroid ultrasound for further evaluation if not previously performed. Electronically Signed   By: Emily Filbert M.D.   On: 04/23/2023 13:28    Procedures .Laceration Repair  Date/Time: 04/23/2023 5:14 PM  Performed by: Rolan Bucco, MD Authorized by: Rolan Bucco, MD   Consent:    Consent obtained:  Verbal   Consent given by:  Patient   Risks discussed:  Infection and pain   Alternatives discussed:  No treatment Anesthesia:    Anesthesia method:  Topical application   Topical anesthetic:  LET Laceration details:    Location:  Scalp   Scalp location:  Occipital   Length (cm):  3.5 Pre-procedure details:    Preparation:   Patient was prepped and draped in usual sterile fashion and imaging obtained to evaluate for foreign bodies Exploration:    Imaging outcome: foreign body not noted     Wound exploration: entire depth of wound visualized     Wound extent: fascia not violated, no foreign body, no signs of injury, no underlying fracture and no vascular damage     Contaminated: no   Treatment:    Area cleansed with:  Saline   Amount of cleaning:  Standard   Irrigation solution:  Sterile saline   Irrigation method:  Syringe   Visualized foreign bodies/material removed: no   Skin repair:    Repair method:  Staples   Number of staples:  6 Approximation:    Approximation:  Close Repair type:    Repair type:  Simple Post-procedure details:    Dressing:  Open (no dressing)   Procedure completion:  Tolerated well, no immediate complications     Medications Ordered in ED Medications  lidocaine-EPINEPHrine-tetracaine (LET) topical gel (3 mLs Topical Given 04/23/23 1635)    ED Course/ Medical Decision Making/ A&P                                 Medical Decision Making Amount and/or Complexity of Data Reviewed Labs: ordered.   Patient is a 83 year old female who presents after she thinks she had a seizure.  This was not witnessed but she had similar symptoms preceding the episode as to what she had typically had when she was having seizures many years ago.  She sustained a laceration to the back of her scalp.  She denies any other injuries.  The laceration was repaired with staples.  She had CT scans of her head and cervical spine which show no acute abnormalities.  Labs reviewed and are nonconcerning.  She has had no further seizure activity.  She feels like the seizure was related to her not sleeping well.  She takes 1 mg of clonazepam at night.  She has been on this dose for about 2 years.  Advised her that for tonight she could take 1-1/2 tablets.  She can then talk to her primary care doctor who manages  this to recommend daily doses.  Recommended that she follow-up with a neurologist as she does not have a neurologist.  Was given a referral to 1.  She was discharged home in good condition.  Return precautions were given.  I did advise her that she has a slight increase in her thyroid nodule and this should be followed up by her primary care doctor.  Final Clinical Impression(s) / ED Diagnoses Final diagnoses:  Seizure ([name])  Laceration of scalp, initial encounter    Rx / DC Orders ED Discharge Orders     None         Rolan Bucco, MD 04/23/23 1727

## 2023-04-23 NOTE — ED Notes (Signed)
 Patient did get dizzy when getting up to go to bathroom

## 2023-04-23 NOTE — ED Triage Notes (Addendum)
 Pt reported a fall. Lives in ALF. Arrived with EMS. Fall was unwitnessed. No thinners. Pt has not has hx of hz. States she had one todAY. Has not had one in 40 years.  Lac to the back of head. Reports hasn't missed any doses of her tegretol. Pt states she urinated on herself and changed her clothes before anyone arrived.

## 2023-04-23 NOTE — ED Provider Triage Note (Signed)
 Emergency Medicine Provider Triage Evaluation Note  Erin Ochoa , a 83 y.o. female  was evaluated in triage.  Pt complains of unwitnessed fall with head injury.  History of epilepsy, felt similar.    Review of Systems  Positive:  Negative:   Physical Exam  BP (!) 109/55 (BP Location: Left Arm)   Pulse 74   Temp 98.2 F (36.8 C) (Oral)   Resp 18   Ht 5\' 5"  (1.651 m)   Wt 45.4 kg   SpO2 100%   BMI 16.64 kg/m  Gen:   Awake, no distress   Resp:  Normal effort  MSK:   Moves extremities without difficulty  Other:  Posterior scalp lack with hemostasis achieved  Medical Decision Making  Medically screening exam initiated at 12:27 PM.  Appropriate orders placed.  Erin Ochoa was informed that the remainder of the evaluation will be completed by another provider, this initial triage assessment does not replace that evaluation, and the importance of remaining in the ED until their evaluation is complete.     Halford Decamp, PA-C 04/23/23 1228

## 2023-04-23 NOTE — ED Notes (Signed)
 [facility] called to take pt back to [facility].

## 2023-04-24 ENCOUNTER — Other Ambulatory Visit: Payer: Self-pay

## 2023-04-24 ENCOUNTER — Encounter (HOSPITAL_COMMUNITY): Payer: Self-pay

## 2023-04-24 ENCOUNTER — Emergency Department (HOSPITAL_COMMUNITY)
Admission: EM | Admit: 2023-04-24 | Discharge: 2023-04-24 | Disposition: A | Discharge status: Home / Self Care | Attending: Emergency Medicine | Admitting: Emergency Medicine

## 2023-04-24 ENCOUNTER — Emergency Department (HOSPITAL_COMMUNITY)

## 2023-04-24 DIAGNOSIS — G40909 Epilepsy, unspecified, not intractable, without status epilepticus: Secondary | ICD-10-CM | POA: Insufficient documentation

## 2023-04-24 DIAGNOSIS — M549 Dorsalgia, unspecified: Secondary | ICD-10-CM | POA: Diagnosis not present

## 2023-04-24 DIAGNOSIS — M438X6 Other specified deforming dorsopathies, lumbar region: Secondary | ICD-10-CM | POA: Diagnosis not present

## 2023-04-24 DIAGNOSIS — W01198A Fall on same level from slipping, tripping and stumbling with subsequent striking against other object, initial encounter: Secondary | ICD-10-CM | POA: Diagnosis not present

## 2023-04-24 DIAGNOSIS — S22050A Wedge compression fracture of T5-T6 vertebra, initial encounter for closed fracture: Secondary | ICD-10-CM

## 2023-04-24 DIAGNOSIS — S22059A Unspecified fracture of T5-T6 vertebra, initial encounter for closed fracture: Secondary | ICD-10-CM | POA: Insufficient documentation

## 2023-04-24 DIAGNOSIS — R42 Dizziness and giddiness: Secondary | ICD-10-CM | POA: Insufficient documentation

## 2023-04-24 DIAGNOSIS — R29890 Loss of height: Secondary | ICD-10-CM | POA: Diagnosis not present

## 2023-04-24 DIAGNOSIS — M5124 Other intervertebral disc displacement, thoracic region: Secondary | ICD-10-CM | POA: Diagnosis not present

## 2023-04-24 DIAGNOSIS — S29002A Unspecified injury of muscle and tendon of back wall of thorax, initial encounter: Secondary | ICD-10-CM | POA: Diagnosis present

## 2023-04-24 DIAGNOSIS — W19XXXA Unspecified fall, initial encounter: Secondary | ICD-10-CM | POA: Diagnosis not present

## 2023-04-24 DIAGNOSIS — M4804 Spinal stenosis, thoracic region: Secondary | ICD-10-CM | POA: Diagnosis not present

## 2023-04-24 DIAGNOSIS — Z7401 Bed confinement status: Secondary | ICD-10-CM | POA: Diagnosis not present

## 2023-04-24 DIAGNOSIS — G40802 Other epilepsy, not intractable, without status epilepticus: Secondary | ICD-10-CM | POA: Diagnosis not present

## 2023-04-24 DIAGNOSIS — I700 Atherosclerosis of aorta: Secondary | ICD-10-CM | POA: Diagnosis not present

## 2023-04-24 DIAGNOSIS — R531 Weakness: Secondary | ICD-10-CM | POA: Diagnosis not present

## 2023-04-24 LAB — BASIC METABOLIC PANEL
Anion gap: 10 (ref 5–15)
BUN: 28 mg/dL — ABNORMAL HIGH (ref 8–23)
CO2: 27 mmol/L (ref 22–32)
Calcium: 9.3 mg/dL (ref 8.9–10.3)
Chloride: 104 mmol/L (ref 98–111)
Creatinine, Ser: 0.61 mg/dL (ref 0.44–1.00)
GFR, Estimated: >60 mL/min (ref >60)
Glucose, Bld: 118 mg/dL — ABNORMAL HIGH (ref 70–99)
Potassium: 3.9 mmol/L (ref 3.5–5.1)
Sodium: 141 mmol/L (ref 135–145)

## 2023-04-24 LAB — CBC
HCT: 41.2 % (ref 36.0–46.0)
Hemoglobin: 13.2 g/dL (ref 12.0–15.0)
MCH: 32.7 pg (ref 26.0–34.0)
MCHC: 32 g/dL (ref 30.0–36.0)
MCV: 102 fL — ABNORMAL HIGH (ref 80.0–100.0)
Platelets: 172 10*3/uL (ref 150–400)
RBC: 4.04 MIL/uL (ref 3.87–5.11)
RDW: 13.4 % (ref 11.5–15.5)
WBC: 8.1 10*3/uL (ref 4.0–10.5)
nRBC: 0 % (ref 0.0–0.2)

## 2023-04-24 MED ORDER — HYDROCODONE-ACETAMINOPHEN 5-325 MG PO TABS: 1.0000 | ORAL_TABLET | Freq: Four times a day (QID) | ORAL | 0 refills | Status: DC | PRN

## 2023-04-24 MED ORDER — FENTANYL CITRATE PF 50 MCG/ML IJ SOSY: 50.0000 ug | PREFILLED_SYRINGE | Freq: Once | INTRAMUSCULAR | Status: DC

## 2023-04-24 MED ORDER — ACETAMINOPHEN 325 MG PO TABS
650.0000 mg | ORAL_TABLET | Freq: Once | ORAL | Status: AC
  Administered 2023-04-24: 650 mg via ORAL
  Filled 2023-04-24: qty 2

## 2023-04-24 MED ORDER — SODIUM CHLORIDE 0.9 % IV BOLUS
500.0000 mL | Freq: Once | INTRAVENOUS | Status: AC
  Administered 2023-04-24: 500 mL via INTRAVENOUS

## 2023-04-24 NOTE — Discharge Instructions (Addendum)
 You have a compression fracture on your thoracic spine.  Follow-up with your doctors.  You potentially could need another injection like before.  Tylenol or the hydrocodone with Tylenol may help with the pain.  You also were a little dehydrated after the time in the ER yesterday.

## 2023-04-24 NOTE — ED Provider Notes (Signed)
 [place] EMERGENCY DEPARTMENT AT [facility] Provider Note   CSN: [id number] Arrival date & time: 04/24/23  1216     History  Chief Complaint  Patient presents with   Fall        Back Pain    Erin Ochoa is a 83 y.o. female.   Fall  Back Pain Patient was seen in the ER yesterday.  Had a seizure yesterday.  Today had dizzy episode where she fell.  States she slept very well last night and then when she got up this morning she felt lightheaded.  States she felt off and then fell.  Pain in her mid back.  States she has had a previous episode of dizziness like this after previous surgery where she slept really well then got up and felt better.  No loss consciousness today.  Not on blood thinners.  No loss conscious.    Past Medical History:  Diagnosis Date   Anxiety    Cataract    Chronic constipation    Depression    Osteoporosis    Seizures ([name])    epilepsy  last seizure 1977    Home Medications Prior to Admission medications   Medication Sig Start Date End Date Taking? Authorizing Provider  acetaminophen (TYLENOL) 500 MG tablet Take 500-1,000 mg by mouth every 6 (six) hours as needed (for pain).   Yes [provider]  Artificial Tear Solution (SOOTHE XP) SOLN Place 1 drop into both eyes 3 (three) times daily.   Yes [provider]  atorvastatin (LIPITOR) 10 MG tablet TAKE 1/2 TABLET DAILY Patient taking differently: Take 5 mg by mouth at bedtime. 04/15/23  Yes Mast, Man X, NP  Calcium Carbonate-Vit D-Min (CALTRATE 600+D PLUS MINERALS) 600-800 MG-UNIT CHEW Chew 1 each by mouth 2 (two) times daily.   Yes [provider]  clonazePAM (KLONOPIN) 1 MG tablet *BOTTLE* TAKE 1 TABLET BY MOUTH EVERY NIGHT AT BEDTIME *HAZARDOUS DRUG: WEAR GLOVES* *DO NOT CRUSH* [phone] CALL TO LET HER KNOW THE DAY WE COME Patient taking differently: Take 1 mg by mouth at bedtime. 04/16/23  Yes Mast, Man X, NP  HYDROcodone-acetaminophen (NORCO/VICODIN)  5-325 MG tablet Take 1-2 tablets by mouth every 6 (six) hours as needed. 04/24/23  Yes Benjiman Core, MD  Multiple Vitamins-Minerals (PRESERVISION AREDS 2) CAPS Take 1 capsule by mouth in the morning and at bedtime.   Yes [provider]  Nutritional Supplements (ENSURE MAX PROTEIN PO) Take 1 Bottle by mouth See admin instructions. Drink 1 bottle by mouth once a day, alternating with one bottle of Ensure original formula every other day   Yes [provider]  Nutritional Supplements (ENSURE ORIGINAL) LIQD Take 1 Bottle by mouth See admin instructions. Drink 1 bottle by mouth every other day- alternating with one bottle of Ensure High Protein formula every other day   Yes [provider]  Polyethyl Glycol-Propyl Glycol (SYSTANE) 0.4-0.3 % GEL ophthalmic gel Place 1 Application into both eyes at bedtime.   Yes [provider]  polyethylene glycol powder (GLYCOLAX/MIRALAX) 17 GM/SCOOP powder Take 17 g by mouth daily.   Yes [provider]  TEGRETOL 200 MG tablet TAKE 1 AND 1/2 TABLETS BY MOUTH AT BEDTIME Patient taking differently: Take 300 mg by mouth at bedtime. 12/25/22  Yes Mast, Man X, NP  Vitamin D, Ergocalciferol, 50000 units CAPS TAKE ONE CAPSULE BY MOUTH ONCE A WEEK Patient taking differently: Take 50,000 Units by mouth every Tuesday. 11/04/22  Yes Mast, Man  X, NP  venlafaxine XR (EFFEXOR XR) 37.5 MG 24 hr capsule Take 1 capsule (37.5 mg total) by mouth daily with breakfast. Patient not taking: Reported on 04/24/2023 01/09/23   Erin Sheffield, MD      Allergies    Carbamazepine, Other, Cat dander, Cefaclor, Codeine, Fish allergy, Iodinated contrast media, Penicillins, Percocet [oxycodone-acetaminophen], Red dye #40 (allura red), Senna-docusate sodium [sennosides-docusate sodium], Shellfish-derived products, Shrimp (diagnostic), Tomato, and Hm lidocaine patch [lidocaine]    Review of Systems   Review of Systems  Musculoskeletal:  Positive for  back pain.    Physical Exam Updated Vital Signs BP (!) 143/67 (BP Location: Right Arm)   Pulse 72   Temp 97.9 F (36.6 C) (Oral)   Resp 16   SpO2 98%  Physical Exam Vitals and nursing note reviewed.  Chest:     Chest wall: No tenderness.  Abdominal:     Tenderness: There is no abdominal tenderness.  Musculoskeletal:        General: Tenderness present.     Cervical back: Neck supple.     Comments: Tenderness over mid thoracic spine.  Skin:    Capillary Refill: Capillary refill takes less than 2 seconds.  Neurological:     Mental Status: She is alert and oriented to person, place, and time.     ED Results / Procedures / Treatments   Labs (all labs ordered are listed, but only abnormal results are displayed) Labs Reviewed  BASIC METABOLIC PANEL - Abnormal; Notable for the following components:      Result Value   Glucose, Bld 118 (*)    BUN 28 (*)    All other components within normal limits  CBC - Abnormal; Notable for the following components:   MCV 102.0 (*)    All other components within normal limits    EKG None  Radiology CT Lumbar Spine Wo Contrast Result Date: 04/24/2023 CLINICAL DATA:  Fall yesterday and today.  Back pain. EXAM: CT LUMBAR SPINE WITHOUT CONTRAST TECHNIQUE: Multidetector CT imaging of the lumbar spine was performed without intravenous contrast administration. Multiplanar CT image reconstructions were also generated. RADIATION DOSE REDUCTION: This exam was performed according to the departmental dose-optimization program which includes automated exposure control, adjustment of the mA and/or kV according to patient size and/or use of iterative reconstruction technique. COMPARISON:  MRI lumbar spine 06/23/2021. FINDINGS: Segmentation: 5 non rib-bearing lumbar type vertebral bodies are present. The lowest fully formed vertebral body is L5. Alignment: No significant listhesis is present. Lumbar lordosis is preserved. Rightward curvature is centered at  L2-3. Vertebrae: Remote spinal augmentation at L1 is noted. Vertebral body heights are otherwise normal. No other acute scratched at no acute fracture is present. Paraspinal and other soft tissues: Atherosclerotic calcifications are present in the aorta without aneurysm. A 4.2 cm simple cyst is present at the upper pole of the right kidney. The visualized abdomen is otherwise within normal limits. No significant adenopathy is present. Disc levels: No significant focal disc protrusion or stenosis is present in lumbar spine. Facet hypertrophy is present bilaterally at L5-S1. IMPRESSION: 1. No acute fracture or traumatic subluxation. 2. Remote spinal augmentation at L1. 3. Rightward curvature of the lumbar spine centered at L2-3. 4. Facet hypertrophy bilaterally at L5-S1. 5. 4.2 cm simple cyst at the upper pole of the right kidney. Electronically Signed   By: Marin Roberts M.D.   On: 04/24/2023 18:15   CT Thoracic Spine Wo Contrast Result Date: 04/24/2023 CLINICAL DATA:  Fall yesterday. Unwitnessed fall  with trauma to head. Dizziness. EXAM: CT THORACIC SPINE WITHOUT CONTRAST TECHNIQUE: Multidetector CT images of the thoracic were obtained using the standard protocol without intravenous contrast. RADIATION DOSE REDUCTION: This exam was performed according to the departmental dose-optimization program which includes automated exposure control, adjustment of the mA and/or kV according to patient size and/or use of iterative reconstruction technique. COMPARISON:  MRI of the thoracic spine 06/23/2021. FINDINGS: Alignment: No significant listhesis is present. Thoracic kyphosis is stable. Mild leftward curvature of the thoracic spine is centered at T10. Vertebrae: Acute superior endplate fracture is present at T6 the vertebral body height is reduced to 10 mm. This compares to 14 mm at T5. No other acute fractures are present. Spinal augmentation is noted at T8 and L1. Paraspinous soft tissues: Minimal soft tissue  density adjacent to the T6 vertebral body is likely posttraumatic. Paraspinous soft tissues are otherwise within normal limits. Descending thoracic aortic calcifications are present without aneurysm. The visualized lung fields are clear. No significant pleural effusion or pneumothorax is present. The upper abdomen is within normal limits. Disc levels: A left paramedian disc protrusion partially effaces the left ventral CSF at T9-10. No other significant central disc disease or stenosis is present. Osseous foraminal narrowing is present on the right at T8-9. The foramina are otherwise patent bilaterally. IMPRESSION: 1. Acute superior endplate fracture at T6 with 10 mm of vertebral body height loss. 2. No other acute fractures. 3. Spinal augmentation at T8 and L1. 4. Left paramedian disc protrusion at T9-10 partially effaces the left ventral CSF. 5. Osseous foraminal narrowing on the right at T8-9. Electronically Signed   By: Marin Roberts M.D.   On: 04/24/2023 18:07   CT Head Wo Contrast Result Date: 04/23/2023 CLINICAL DATA:  Seizure, fall with head trauma. EXAM: CT HEAD WITHOUT CONTRAST CT CERVICAL SPINE WITHOUT CONTRAST TECHNIQUE: Multidetector CT imaging of the head and cervical spine was performed following the standard protocol without intravenous contrast. Multiplanar CT image reconstructions of the cervical spine were also generated. RADIATION DOSE REDUCTION: This exam was performed according to the departmental dose-optimization program which includes automated exposure control, adjustment of the mA and/or kV according to patient size and/or use of iterative reconstruction technique. COMPARISON:  06/21/2021 FINDINGS: CT HEAD FINDINGS Brain: No acute intracranial hemorrhage. No CT evidence of acute infarct. No edema, mass effect, or midline shift. The basilar cisterns are patent. Ventricles: Prominence of the ventricles suggesting underlying parenchymal volume loss. Vascular: No hyperdense vessel or  unexpected calcification. Skull: No acute or aggressive finding. Orbits: Orbits are symmetric. Sinuses: The visualized paranasal sinuses are clear. Other: Laceration and mild soft tissue swelling in the right parieto-occipital scalp. Mastoid air cells are clear CT CERVICAL SPINE FINDINGS Alignment: Cervical lordosis is maintained. Trace retrolisthesis of C5 on C6. No facet subluxation or dislocation. Skull base and vertebrae: No compression fracture or displaced fracture in the cervical spine. No suspicious osseous lesion. Soft tissues and spinal canal: No prevertebral fluid or swelling. No visible canal hematoma. 2.0 cm nodule involving the inferior aspect of the right thyroid lobe and isthmus is present since 2011 at which time it measured 1.7 cm. Disc levels: Moderate to severe disc space narrowing at C5-6. Disc bulges at multiple levels without high-grade spinal canal stenosis. Disc osteophyte complex at C5-6 without high-grade spinal canal stenosis. Facet arthrosis greatest on the left at C4-5. Upper chest: Biapical pleuroparenchymal scarring and pleural calcifications. Other: None. IMPRESSION: 1. No acute intracranial abnormality. 2. Laceration and mild soft tissue swelling  in the right parieto-occipital scalp. 3. No acute fracture or traumatic malalignment in the cervical spine. 4. Multilevel degenerative changes of the cervical spine, as described above. 5. 2.0 cm nodule involving the inferior aspect of the right thyroid lobe and isthmus, slightly increased in size from 2011. Consider thyroid ultrasound for further evaluation if not previously performed. Electronically Signed   By: Emily Filbert M.D.   On: 04/23/2023 13:28   CT Cervical Spine Wo Contrast Result Date: 04/23/2023 CLINICAL DATA:  Seizure, fall with head trauma. EXAM: CT HEAD WITHOUT CONTRAST CT CERVICAL SPINE WITHOUT CONTRAST TECHNIQUE: Multidetector CT imaging of the head and cervical spine was performed following the standard protocol  without intravenous contrast. Multiplanar CT image reconstructions of the cervical spine were also generated. RADIATION DOSE REDUCTION: This exam was performed according to the departmental dose-optimization program which includes automated exposure control, adjustment of the mA and/or kV according to patient size and/or use of iterative reconstruction technique. COMPARISON:  06/21/2021 FINDINGS: CT HEAD FINDINGS Brain: No acute intracranial hemorrhage. No CT evidence of acute infarct. No edema, mass effect, or midline shift. The basilar cisterns are patent. Ventricles: Prominence of the ventricles suggesting underlying parenchymal volume loss. Vascular: No hyperdense vessel or unexpected calcification. Skull: No acute or aggressive finding. Orbits: Orbits are symmetric. Sinuses: The visualized paranasal sinuses are clear. Other: Laceration and mild soft tissue swelling in the right parieto-occipital scalp. Mastoid air cells are clear CT CERVICAL SPINE FINDINGS Alignment: Cervical lordosis is maintained. Trace retrolisthesis of C5 on C6. No facet subluxation or dislocation. Skull base and vertebrae: No compression fracture or displaced fracture in the cervical spine. No suspicious osseous lesion. Soft tissues and spinal canal: No prevertebral fluid or swelling. No visible canal hematoma. 2.0 cm nodule involving the inferior aspect of the right thyroid lobe and isthmus is present since 2011 at which time it measured 1.7 cm. Disc levels: Moderate to severe disc space narrowing at C5-6. Disc bulges at multiple levels without high-grade spinal canal stenosis. Disc osteophyte complex at C5-6 without high-grade spinal canal stenosis. Facet arthrosis greatest on the left at C4-5. Upper chest: Biapical pleuroparenchymal scarring and pleural calcifications. Other: None. IMPRESSION: 1. No acute intracranial abnormality. 2. Laceration and mild soft tissue swelling in the right parieto-occipital scalp. 3. No acute fracture or  traumatic malalignment in the cervical spine. 4. Multilevel degenerative changes of the cervical spine, as described above. 5. 2.0 cm nodule involving the inferior aspect of the right thyroid lobe and isthmus, slightly increased in size from 2011. Consider thyroid ultrasound for further evaluation if not previously performed. Electronically Signed   By: Emily Filbert M.D.   On: 04/23/2023 13:28    Procedures Procedures    Medications Ordered in ED Medications  sodium chloride 0.9 % bolus 500 mL (0 mLs Intravenous Stopped 04/24/23 2006)  acetaminophen (TYLENOL) tablet 650 mg (650 mg Oral Given 04/24/23 1933)    ED Course/ Medical Decision Making/ A&P                                 Medical Decision Making Amount and/or Complexity of Data Reviewed Labs: ordered. Radiology: ordered.  Risk OTC drugs. Prescription drug management.   Patient felt lightheaded and fell today.  Seems more lightheaded notes as opposed to vertiginous.  Pain in the mid back.  Differential diagnose includes compression fractures and other spinal injuries.  Will get CT imaging of thoracic and lumbar spine.  Chest nontender.  Will get basic blood work.  Creatinine mildly increased.  May have some mild dehydration.  Fluid bolus given.  However does have a T6 compression fracture.  Will give pain medicines.  Can follow-up with PCP who has in the past had kyphoplasty done.  Appears stable for discharge home however.        Final Clinical Impression(s) / ED Diagnoses Final diagnoses:  Compression fracture of T6 vertebra, initial encounter [facility])    Rx / DC Orders ED Discharge Orders          Ordered    HYDROcodone-acetaminophen (NORCO/VICODIN) 5-325 MG tablet  Every 6 hours PRN        04/24/23 1953              Benjiman Core, MD 04/24/23 2350

## 2023-04-24 NOTE — ED Triage Notes (Addendum)
 Pt arrived from ALF. Another fall today, fell yesterday. Today pt reports falling, unwitnessed and hit her head. Reports lower back pain, no thinners. Denies any dizziness, cp,shob other concerns. Ambulatory on arrival per EMS

## 2023-04-27 ENCOUNTER — Other Ambulatory Visit: Payer: Self-pay | Admitting: Nurse Practitioner

## 2023-04-27 DIAGNOSIS — G8929 Other chronic pain: Secondary | ICD-10-CM

## 2023-04-27 DIAGNOSIS — M545 Low back pain, unspecified: Secondary | ICD-10-CM

## 2023-04-27 MED ORDER — LIDOCAINE 4 % EX PTCH: 1.0000 | MEDICATED_PATCH | CUTANEOUS | 1 refills | Status: DC

## 2023-04-30 ENCOUNTER — Non-Acute Institutional Stay: Admitting: Nurse Practitioner

## 2023-04-30 ENCOUNTER — Telehealth: Payer: Self-pay

## 2023-04-30 ENCOUNTER — Encounter: Payer: Self-pay | Admitting: Nurse Practitioner

## 2023-04-30 VITALS — BP 110/68 | HR 96 | Temp 97.0°F | Ht 65.0 in | Wt 103.0 lb

## 2023-04-30 DIAGNOSIS — K5904 Chronic idiopathic constipation: Secondary | ICD-10-CM | POA: Diagnosis not present

## 2023-04-30 DIAGNOSIS — G40822 Epileptic spasms, not intractable, without status epilepticus: Secondary | ICD-10-CM

## 2023-04-30 DIAGNOSIS — M1990 Unspecified osteoarthritis, unspecified site: Secondary | ICD-10-CM | POA: Diagnosis not present

## 2023-04-30 DIAGNOSIS — E785 Hyperlipidemia, unspecified: Secondary | ICD-10-CM | POA: Diagnosis not present

## 2023-04-30 DIAGNOSIS — R7303 Prediabetes: Secondary | ICD-10-CM

## 2023-04-30 DIAGNOSIS — R42 Dizziness and giddiness: Secondary | ICD-10-CM | POA: Diagnosis not present

## 2023-04-30 DIAGNOSIS — M810 Age-related osteoporosis without current pathological fracture: Secondary | ICD-10-CM | POA: Diagnosis not present

## 2023-04-30 DIAGNOSIS — S22060A Wedge compression fracture of T7-T8 vertebra, initial encounter for closed fracture: Secondary | ICD-10-CM | POA: Diagnosis not present

## 2023-04-30 NOTE — Assessment & Plan Note (Signed)
stable, on MiraLax, Senokot S II bid.

## 2023-04-30 NOTE — Assessment & Plan Note (Signed)
takes Clonazepam, failed GDR,  failed Ambien. TSH 3.4 06/03/22

## 2023-04-30 NOTE — Assessment & Plan Note (Addendum)
 ED 04/24/23 mid back pain sustained from fall again after day before, she was found to have T6 fx on CT T spine, L spine. Norco and Lidocaine topical for pain.  Referral to neurosurgery

## 2023-04-30 NOTE — Assessment & Plan Note (Signed)
s/p left hip surgery, s/p Kyphplasty 

## 2023-04-30 NOTE — Assessment & Plan Note (Signed)
 Hgb a1c 5.7 04/25/21<<6.2 06/03/22, lifestyle modification ACR 5 11/11/22 Update lipids, A1c, TSH

## 2023-04-30 NOTE — Assessment & Plan Note (Signed)
takes Reclast, t-score -3.6 09/13/19, -3.5 05/12/22

## 2023-04-30 NOTE — Assessment & Plan Note (Signed)
 Recurrent, Clonazepam may contributory

## 2023-04-30 NOTE — Assessment & Plan Note (Signed)
takes Atorvastatin 5mg  qd, LDL 81 06/13/22

## 2023-04-30 NOTE — Telephone Encounter (Signed)
 Copied from CRM [id number]. Topic: General - Other >> Apr 30, 2023  9:01 AM Philippa Chester F wrote: Reason for CRM:  Caller: Bertram Millard (Nurse)  Calling from: [facility] in [place]  They are in need of a current medication sheet for the patient in order to submit the [facility] for the patient. Please fax over the medication list addressed to [facility].  Fax Number: [phone] Phone Number: [phone]

## 2023-04-30 NOTE — Progress Notes (Unsigned)
 Location:   Clinic [facility]   Place of Service:  Clinic (12) Provider: [facility] Rusty Glodowski NP  Jacey Pelc X, NP  Patient Care Team: Angelic Schnelle X, NP as PCP - General (Internal Medicine) Lyrick Worland X, NP as Nurse Practitioner (Internal Medicine)  Extended Emergency Contact Information Primary Emergency Contact: Charlies Constable [place] Mobile Phone: [phone] Relation: Friend  Code Status: DNR Goals of care: Advanced Directive information    04/24/2023   12:27 PM  Advanced Directives  Does Patient Have a Medical Advance Directive? No     Chief Complaint  Patient presents with   Hospitalization Follow-up    Follow-up from recent hospital stay, patient with significant back pain related to fall. Patient is a high fall risk. Patient needs 6 staples removed from the back of head    HPI:  Pt is a 83 y.o. female seen today for an acute visit for f/u hospital stay.   ED 04/24/23 mid back pain sustained from fall again after day before, she was found to have T6 fx on CT T spine, L spine. Norco and Lidocaine topical for pain.  ED 04/23/23 for dizziness, fall, head injury/scal laceration, etiology was deemed to active seizure, followed by Neurology, CT head/cervical spine, EKG, labs unremarkable.   OA, s/p left hip surgery, s/p Kyphplasty             Dizziness: recurrent.               Post op anemia, Hgb 13.2 04/24/23             Seizures, no active seizures since last seen, failed generic Tegretol in the past, on Tegretol 200mg  qd, Clonazepam hs (GDR to stop on her own didn't cause active seizures, but flare up insomnia and anxiety). S/p Neurology              Constipation, stable, on MiraLax, Senokot S II bid.              Hyperlipidemia, takes Atorvastatin 5mg  qd, LDL 81 06/13/22             Prediabetic, Hgb a1c 5.7 04/25/21<<6.2 06/03/22, lifestyle modification ACR 5 11/11/22             OP takes Reclast, t-score -3.6 09/13/19, -3.5 05/12/22             Insomnia/anxiety, takes Clonazepam,  failed GDR,  failed Ambien. TSH 3.4 06/03/22             Erosive gastropathy, off PPI     Past Medical History:  Diagnosis Date   Anxiety    Cataract    Chronic constipation    Depression    Osteoporosis    Seizures ([name])    epilepsy  last seizure 1977   Past Surgical History:  Procedure Laterality Date   ABDOMINAL HYSTERECTOMY  1995   Dr. Carlis Abbott   CATARACT EXTRACTION Bilateral 12/2016   COLONOSCOPY  2012   patchy increased intraepithelial lymphocytes - draelos   ESOPHAGOGASTRODUODENOSCOPY     multiple   FISSURECTOMY     INTRAMEDULLARY (IM) NAIL INTERTROCHANTERIC Left 05/09/2021   Procedure: INTRAMEDULLARY (IM) NAIL INTERTROCHANTRIC;  Surgeon: Cammy Copa, MD;  Location: [facility] OR;  Service: Orthopedics;  Laterality: Left;   IR KYPHO EA ADDL LEVEL THORACIC OR LUMBAR  06/26/2021   IR KYPHO THORACIC WITH BONE BIOPSY  06/26/2021   WRIST SURGERY Left    due to fracture    Allergies  Allergen  Reactions   Carbamazepine Other (See Comments)    HAS TO TAKE NAME BRAND ONLY TEGRETOL OR A SEIZURE OCCURS   Other Other (See Comments)    NO FOODS WITH Seeds -- Reflux   Cat Dander Swelling and Other (See Comments)    Patient states that it causes her eyes to swell shut   Cefaclor Other (See Comments)    Unknown reaction   Codeine Nausea Only   Fish Allergy Nausea Only   Iodinated Contrast Media Other (See Comments)    Unknown reaction     Penicillins Itching   Percocet [Oxycodone-Acetaminophen] Nausea And Vomiting   Red Dye #40 (Allura Red) Other (See Comments)    Reflux   Senna-Docusate Sodium [Sennosides-Docusate Sodium] Other (See Comments)    Severe stomach pains   Shellfish-Derived Products Nausea Only   Shrimp (Diagnostic) Nausea Only    Stomach pain    Tomato Other (See Comments)    Reflux   Hm Lidocaine Patch [Lidocaine] Rash    Says that this was related to putting a heating pad on the area with the lidocaine patch, has used lidocaine patches in other  areas without a reaction    Allergies as of 04/30/2023       Reactions   Carbamazepine Other (See Comments)   HAS TO TAKE NAME BRAND ONLY TEGRETOL OR A SEIZURE OCCURS   Other Other (See Comments)   NO FOODS WITH Seeds -- Reflux   Cat Dander Swelling, Other (See Comments)   Patient states that it causes her eyes to swell shut   Cefaclor Other (See Comments)   Unknown reaction   Codeine Nausea Only   Fish Allergy Nausea Only   Iodinated Contrast Media Other (See Comments)   Unknown reaction   Penicillins Itching   Percocet [oxycodone-acetaminophen] Nausea And Vomiting   Red Dye #40 (allura Red) Other (See Comments)   Reflux   Senna-docusate Sodium [sennosides-docusate Sodium] Other (See Comments)   Severe stomach pains   Shellfish-derived Products Nausea Only   Shrimp (diagnostic) Nausea Only   Stomach pain   Tomato Other (See Comments)   Reflux   Hm Lidocaine Patch [lidocaine] Rash   Says that this was related to putting a heating pad on the area with the lidocaine patch, has used lidocaine patches in other areas without a reaction        Medication List        Accurate as of April 30, 2023 11:59 PM. If you have any questions, ask your nurse or doctor.          acetaminophen 500 MG tablet Commonly known as: TYLENOL Take 500-1,000 mg by mouth every 6 (six) hours as needed (for pain).   atorvastatin 10 MG tablet Commonly known as: LIPITOR TAKE 1/2 TABLET DAILY   Caltrate 600+D Plus Minerals 600-800 MG-UNIT Chew Chew 1 each by mouth 2 (two) times daily.   clonazePAM 1 MG tablet Commonly known as: KLONOPIN *BOTTLE* TAKE 1 TABLET BY MOUTH EVERY NIGHT AT BEDTIME *HAZARDOUS DRUG: WEAR GLOVES* *DO NOT CRUSH* [phone] CALL TO LET HER KNOW THE DAY WE COME   ENSURE MAX PROTEIN PO Take 1 Bottle by mouth See admin instructions. Drink 1 bottle by mouth once a day, alternating with one bottle of Ensure original formula every other day   Ensure Original Liqd Take 1  Bottle by mouth See admin instructions. Drink 1 bottle by mouth every other day- alternating with one bottle of Ensure High Protein formula every other day  HYDROcodone-acetaminophen 5-325 MG tablet Commonly known as: NORCO/VICODIN Take 1-2 tablets by mouth every 6 (six) hours as needed.   lidocaine 4 % Place 1 patch onto the skin daily.   polyethylene glycol powder 17 GM/SCOOP powder Commonly known as: GLYCOLAX/MIRALAX Take 17 g by mouth daily.   PreserVision AREDS 2 Caps Take 1 capsule by mouth in the morning and at bedtime.   Soothe XP Soln Place 1 drop into both eyes 3 (three) times daily.   Systane 0.4-0.3 % Gel ophthalmic gel Generic drug: Polyethyl Glycol-Propyl Glycol Place 1 Application into both eyes at bedtime.   TEGretol 200 MG tablet Generic drug: carbamazepine TAKE 1 AND 1/2 TABLETS BY MOUTH AT BEDTIME   venlafaxine XR 37.5 MG 24 hr capsule Commonly known as: Effexor XR Take 1 capsule (37.5 mg total) by mouth daily with breakfast.   Vitamin D (Ergocalciferol) 50000 units Caps TAKE ONE CAPSULE BY MOUTH ONCE A WEEK        Review of Systems  Constitutional:  Negative for appetite change, fatigue and fever.  HENT:  Positive for hearing loss. Negative for congestion and voice change.   Eyes:  Negative for visual disturbance.  Respiratory:  Negative for shortness of breath.   Cardiovascular:  Negative for leg swelling.  Gastrointestinal:  Negative for abdominal pain and constipation.  Genitourinary:  Positive for frequency. Negative for dysuria and urgency.       The patient stated she pushes her suprapubic region to help emptying her bladder  Musculoskeletal:  Positive for arthralgias, back pain and gait problem.       Left hip pain when walking, better.  Mid back pain  Skin:  Positive for wound.       Left hip surgical incision is healed.   Neurological:  Positive for numbness. Negative for seizures and weakness.       Tingling/numbness left foot  sometimes. Occasionally left hip/leg pain sometimes. Lightheaded when not sleeping well in lifetime.   Psychiatric/Behavioral:  Negative for behavioral problems and sleep disturbance. The patient is not nervous/anxious.        Feels lightheaded if not sleep well at night.    Immunization History  Administered Date(s) Administered   Fluad Quad(high Dose 65+) 12/03/2020, 12/17/2022   Fluad Trivalent(High Dose 65+) 12/17/2022   Influenza Whole 11/19/2017   Influenza, High Dose Seasonal PF 11/26/2016, 12/01/2018, 11/30/2019, 11/21/2021   Influenza-Unspecified 11/18/2014, 11/29/2015   Moderna Covid-19 Fall Seasonal Vaccine 30yrs & older 12/03/2022   Moderna Covid-19 Vaccine Bivalent Booster 19yrs & up 07/05/2021   Moderna Sars-Covid-2 Vaccination 02/21/2019, 03/21/2019, 12/27/2019, 06/26/2020   Pfizer Covid-19 Vaccine Bivalent Booster 47yrs & up 11/06/2020   Pneumococcal Conjugate-13 09/24/2017   Pneumococcal Polysaccharide-23 12/30/2012, 09/18/2018   Respiratory Syncytial Virus Vaccine,Recomb Aduvanted(Arexvy) 02/01/2022, 02/06/2022   Tdap 08/20/2017   Zoster Recombinant(Shingrix) 07/21/2017, 12/08/2017   Zoster, Live 05/28/2007   Pertinent  Health Maintenance Due  Topic Date Due   OPHTHALMOLOGY EXAM  05/07/2022   HEMOGLOBIN A1C  12/03/2022   FOOT EXAM  11/06/2023   MAMMOGRAM  05/11/2024   INFLUENZA VACCINE  Completed   DEXA SCAN  Completed      12/11/2022   12:57 PM 01/02/2023   10:47 AM 01/09/2023   10:38 AM 02/05/2023    2:39 PM 04/30/2023    1:47 PM  Fall Risk  Falls in the past year? 0 0 0 0 1  Was there an injury with Fall? 0 0 0 0 1  Fall Risk Category Calculator 0  0 0 0 3  Patient at Risk for Falls Due to No Fall Risks No Fall Risks No Fall Risks  History of fall(s)  Fall risk Follow up Falls evaluation completed Falls evaluation completed Falls evaluation completed;Education provided;Falls prevention discussed  Falls evaluation completed   Functional Status Survey:     Vitals:   04/30/23 1133 04/30/23 1409  BP: 110/68   Pulse: (!) 103 96  Temp: (!) 97 F (36.1 C)   TempSrc: Temporal   SpO2: 99%   Weight: 103 lb (46.7 kg)   Height: 5\' 5"  (1.651 m)    Body mass index is 17.14 kg/m. Physical Exam Vitals and nursing note reviewed.  Constitutional:      Appearance: Normal appearance.  HENT:     Head: Normocephalic and atraumatic.     Mouth/Throat:     Mouth: Mucous membranes are moist.  Eyes:     Extraocular Movements: Extraocular movements intact.     Conjunctiva/sclera: Conjunctivae normal.     Pupils: Pupils are equal, round, and reactive to light.  Cardiovascular:     Rate and Rhythm: Normal rate and regular rhythm.     Heart sounds: No murmur heard. Pulmonary:     Effort: Pulmonary effort is normal.     Breath sounds: No rales.  Abdominal:     General: Bowel sounds are normal.     Palpations: Abdomen is soft.     Tenderness: There is no abdominal tenderness.  Musculoskeletal:        General: Tenderness present.     Cervical back: Normal range of motion and neck supple.     Right lower leg: No edema.     Left lower leg: No edema.     Comments: S/p left hip ORIF.  Mid back tenderness  Skin:    General: Skin is warm and dry.     Comments: Left hip surgical scar Occipital scalp laceration, healing. Staple x7 removal   Neurological:     General: No focal deficit present.     Mental Status: She is alert and oriented to person, place, and time. Mental status is at baseline.     Gait: Gait abnormal.  Psychiatric:        Mood and Affect: Mood normal.        Behavior: Behavior normal.        Thought Content: Thought content normal.        Judgment: Judgment normal.     Comments: Stated she feels sad during holidays, but no change of her participation in playing cards, dinning in dinning room with others, taking [facility] bus for shopping, singing programs, etc.      Labs reviewed: Recent Labs    01/13/23 0725 04/23/23 1614  04/24/23 1613  NA 141 140 141  K 3.8 4.1 3.9  CL 103 105 104  CO2 28 25 27   GLUCOSE 110* 108* 118*  BUN 19 26* 28*  CREATININE 0.67 0.55 0.61  CALCIUM 9.4 9.6 9.3   Recent Labs    06/03/22 0718 11/11/22 0725 04/23/23 1614  AST 26 22 29   ALT 20 19 25   ALKPHOS  --   --  39  BILITOT 0.6 0.6 0.5  PROT 6.3 6.3 6.6  ALBUMIN  --   --  4.3   Recent Labs    06/03/22 0718 01/13/23 0725 04/23/23 1614 04/24/23 1613  WBC 4.6 5.4 9.1 8.1  NEUTROABS 2,571  --  7.3  --   HGB 13.3 14.0 13.8  13.2  HCT 40.9 41.8 41.6 41.2  MCV 96.5 99.8 101.5* 102.0*  PLT 227 207 177 172   Lab Results  Component Value Date   TSH 3.40 06/03/2022   Lab Results  Component Value Date   HGBA1C 6.2 (H) 06/03/2022   Lab Results  Component Value Date   CHOL 153 06/03/2022   HDL 51 06/03/2022   LDLCALC 81 06/03/2022   TRIG 112 06/03/2022   CHOLHDL 3.0 06/03/2022    Significant Diagnostic Results in last 30 days:  CT Lumbar Spine Wo Contrast Result Date: 04/24/2023 CLINICAL DATA:  Fall yesterday and today.  Back pain. EXAM: CT LUMBAR SPINE WITHOUT CONTRAST TECHNIQUE: Multidetector CT imaging of the lumbar spine was performed without intravenous contrast administration. Multiplanar CT image reconstructions were also generated. RADIATION DOSE REDUCTION: This exam was performed according to the departmental dose-optimization program which includes automated exposure control, adjustment of the mA and/or kV according to patient size and/or use of iterative reconstruction technique. COMPARISON:  MRI lumbar spine 06/23/2021. FINDINGS: Segmentation: 5 non rib-bearing lumbar type vertebral bodies are present. The lowest fully formed vertebral body is L5. Alignment: No significant listhesis is present. Lumbar lordosis is preserved. Rightward curvature is centered at L2-3. Vertebrae: Remote spinal augmentation at L1 is noted. Vertebral body heights are otherwise normal. No other acute scratched at no acute fracture is  present. Paraspinal and other soft tissues: Atherosclerotic calcifications are present in the aorta without aneurysm. A 4.2 cm simple cyst is present at the upper pole of the right kidney. The visualized abdomen is otherwise within normal limits. No significant adenopathy is present. Disc levels: No significant focal disc protrusion or stenosis is present in lumbar spine. Facet hypertrophy is present bilaterally at L5-S1. IMPRESSION: 1. No acute fracture or traumatic subluxation. 2. Remote spinal augmentation at L1. 3. Rightward curvature of the lumbar spine centered at L2-3. 4. Facet hypertrophy bilaterally at L5-S1. 5. 4.2 cm simple cyst at the upper pole of the right kidney. Electronically Signed   By: Marin Roberts M.D.   On: 04/24/2023 18:15   CT Thoracic Spine Wo Contrast Result Date: 04/24/2023 CLINICAL DATA:  Fall yesterday. Unwitnessed fall with trauma to head. Dizziness. EXAM: CT THORACIC SPINE WITHOUT CONTRAST TECHNIQUE: Multidetector CT images of the thoracic were obtained using the standard protocol without intravenous contrast. RADIATION DOSE REDUCTION: This exam was performed according to the departmental dose-optimization program which includes automated exposure control, adjustment of the mA and/or kV according to patient size and/or use of iterative reconstruction technique. COMPARISON:  MRI of the thoracic spine 06/23/2021. FINDINGS: Alignment: No significant listhesis is present. Thoracic kyphosis is stable. Mild leftward curvature of the thoracic spine is centered at T10. Vertebrae: Acute superior endplate fracture is present at T6 the vertebral body height is reduced to 10 mm. This compares to 14 mm at T5. No other acute fractures are present. Spinal augmentation is noted at T8 and L1. Paraspinous soft tissues: Minimal soft tissue density adjacent to the T6 vertebral body is likely posttraumatic. Paraspinous soft tissues are otherwise within normal limits. Descending thoracic aortic  calcifications are present without aneurysm. The visualized lung fields are clear. No significant pleural effusion or pneumothorax is present. The upper abdomen is within normal limits. Disc levels: A left paramedian disc protrusion partially effaces the left ventral CSF at T9-10. No other significant central disc disease or stenosis is present. Osseous foraminal narrowing is present on the right at T8-9. The foramina are otherwise patent bilaterally. IMPRESSION: 1. Acute  superior endplate fracture at T6 with 10 mm of vertebral body height loss. 2. No other acute fractures. 3. Spinal augmentation at T8 and L1. 4. Left paramedian disc protrusion at T9-10 partially effaces the left ventral CSF. 5. Osseous foraminal narrowing on the right at T8-9. Electronically Signed   By: Marin Roberts M.D.   On: 04/24/2023 18:07   CT Head Wo Contrast Result Date: 04/23/2023 CLINICAL DATA:  Seizure, fall with head trauma. EXAM: CT HEAD WITHOUT CONTRAST CT CERVICAL SPINE WITHOUT CONTRAST TECHNIQUE: Multidetector CT imaging of the head and cervical spine was performed following the standard protocol without intravenous contrast. Multiplanar CT image reconstructions of the cervical spine were also generated. RADIATION DOSE REDUCTION: This exam was performed according to the departmental dose-optimization program which includes automated exposure control, adjustment of the mA and/or kV according to patient size and/or use of iterative reconstruction technique. COMPARISON:  06/21/2021 FINDINGS: CT HEAD FINDINGS Brain: No acute intracranial hemorrhage. No CT evidence of acute infarct. No edema, mass effect, or midline shift. The basilar cisterns are patent. Ventricles: Prominence of the ventricles suggesting underlying parenchymal volume loss. Vascular: No hyperdense vessel or unexpected calcification. Skull: No acute or aggressive finding. Orbits: Orbits are symmetric. Sinuses: The visualized paranasal sinuses are clear. Other:  Laceration and mild soft tissue swelling in the right parieto-occipital scalp. Mastoid air cells are clear CT CERVICAL SPINE FINDINGS Alignment: Cervical lordosis is maintained. Trace retrolisthesis of C5 on C6. No facet subluxation or dislocation. Skull base and vertebrae: No compression fracture or displaced fracture in the cervical spine. No suspicious osseous lesion. Soft tissues and spinal canal: No prevertebral fluid or swelling. No visible canal hematoma. 2.0 cm nodule involving the inferior aspect of the right thyroid lobe and isthmus is present since 2011 at which time it measured 1.7 cm. Disc levels: Moderate to severe disc space narrowing at C5-6. Disc bulges at multiple levels without high-grade spinal canal stenosis. Disc osteophyte complex at C5-6 without high-grade spinal canal stenosis. Facet arthrosis greatest on the left at C4-5. Upper chest: Biapical pleuroparenchymal scarring and pleural calcifications. Other: None. IMPRESSION: 1. No acute intracranial abnormality. 2. Laceration and mild soft tissue swelling in the right parieto-occipital scalp. 3. No acute fracture or traumatic malalignment in the cervical spine. 4. Multilevel degenerative changes of the cervical spine, as described above. 5. 2.0 cm nodule involving the inferior aspect of the right thyroid lobe and isthmus, slightly increased in size from 2011. Consider thyroid ultrasound for further evaluation if not previously performed. Electronically Signed   By: Emily Filbert M.D.   On: 04/23/2023 13:28   CT Cervical Spine Wo Contrast Result Date: 04/23/2023 CLINICAL DATA:  Seizure, fall with head trauma. EXAM: CT HEAD WITHOUT CONTRAST CT CERVICAL SPINE WITHOUT CONTRAST TECHNIQUE: Multidetector CT imaging of the head and cervical spine was performed following the standard protocol without intravenous contrast. Multiplanar CT image reconstructions of the cervical spine were also generated. RADIATION DOSE REDUCTION: This exam was performed  according to the departmental dose-optimization program which includes automated exposure control, adjustment of the mA and/or kV according to patient size and/or use of iterative reconstruction technique. COMPARISON:  06/21/2021 FINDINGS: CT HEAD FINDINGS Brain: No acute intracranial hemorrhage. No CT evidence of acute infarct. No edema, mass effect, or midline shift. The basilar cisterns are patent. Ventricles: Prominence of the ventricles suggesting underlying parenchymal volume loss. Vascular: No hyperdense vessel or unexpected calcification. Skull: No acute or aggressive finding. Orbits: Orbits are symmetric. Sinuses: The visualized paranasal sinuses are  clear. Other: Laceration and mild soft tissue swelling in the right parieto-occipital scalp. Mastoid air cells are clear CT CERVICAL SPINE FINDINGS Alignment: Cervical lordosis is maintained. Trace retrolisthesis of C5 on C6. No facet subluxation or dislocation. Skull base and vertebrae: No compression fracture or displaced fracture in the cervical spine. No suspicious osseous lesion. Soft tissues and spinal canal: No prevertebral fluid or swelling. No visible canal hematoma. 2.0 cm nodule involving the inferior aspect of the right thyroid lobe and isthmus is present since 2011 at which time it measured 1.7 cm. Disc levels: Moderate to severe disc space narrowing at C5-6. Disc bulges at multiple levels without high-grade spinal canal stenosis. Disc osteophyte complex at C5-6 without high-grade spinal canal stenosis. Facet arthrosis greatest on the left at C4-5. Upper chest: Biapical pleuroparenchymal scarring and pleural calcifications. Other: None. IMPRESSION: 1. No acute intracranial abnormality. 2. Laceration and mild soft tissue swelling in the right parieto-occipital scalp. 3. No acute fracture or traumatic malalignment in the cervical spine. 4. Multilevel degenerative changes of the cervical spine, as described above. 5. 2.0 cm nodule involving the  inferior aspect of the right thyroid lobe and isthmus, slightly increased in size from 2011. Consider thyroid ultrasound for further evaluation if not previously performed. Electronically Signed   By: Emily Filbert M.D.   On: 04/23/2023 13:28    Assessment/Plan: Thoracic compression fracture ([name]) ED 04/24/23 mid back pain sustained from fall again after day before, she was found to have T6 fx on CT T spine, L spine. Norco and Lidocaine topical for pain.  Referral to neurosurgery  Epilepsy without status epilepticus, not intractable ([name]) ED 04/23/23 for dizziness, fall, head injury/scal laceration, etiology was deemed to active seizure, followed by Neurology, CT head/cervical spine, EKG, labs unremarkable.  Continue Tegretol, Clonazepam was increased to 1.5mg  daily for seizure while in hospital.   Osteoarthritis s/p left hip surgery, s/p Kyphplasty  Vertigo Recurrent, Clonazepam may contributory  Constipation stable, on MiraLax, Senokot S II bid.   Hyperlipidemia takes Atorvastatin 5mg  qd, LDL 81 06/13/22  Pre-diabetes Hgb a1c 5.7 04/25/21<<6.2 06/03/22, lifestyle modification ACR 5 11/11/22 Update lipids, A1c, TSH  Osteoporosis  takes Reclast, t-score -3.6 09/13/19, -3.5 05/12/22  Insomnia  takes Clonazepam, failed GDR,  failed Ambien. TSH 3.4 06/03/22    Family/ staff Communication: plan of care reviewed with the patient.   Labs/tests ordered: Hgb A1c, TSH, lipids

## 2023-04-30 NOTE — Assessment & Plan Note (Addendum)
 ED 04/23/23 for dizziness, fall, head injury/scal laceration, etiology was deemed to active seizure, followed by Neurology, CT head/cervical spine, EKG, labs unremarkable.  Continue Tegretol, Clonazepam was increased to 1.5mg  daily for seizure while in hospital.

## 2023-04-30 NOTE — Telephone Encounter (Signed)
 Patient medication list faxed as requested with attention to [facility].

## 2023-05-01 ENCOUNTER — Encounter: Payer: Self-pay | Admitting: Nurse Practitioner

## 2023-05-05 DIAGNOSIS — D649 Anemia, unspecified: Secondary | ICD-10-CM | POA: Diagnosis not present

## 2023-05-05 DIAGNOSIS — E785 Hyperlipidemia, unspecified: Secondary | ICD-10-CM | POA: Diagnosis not present

## 2023-05-05 DIAGNOSIS — R799 Abnormal finding of blood chemistry, unspecified: Secondary | ICD-10-CM | POA: Diagnosis not present

## 2023-05-05 LAB — LIPID PANEL
Cholesterol: 171 (ref 0–200)
HDL: 61 (ref 35–70)
LDL Cholesterol: 90
LDl/HDL Ratio: 2.8
Triglycerides: 104 (ref 40–160)

## 2023-05-05 LAB — HEMOGLOBIN A1C: Hemoglobin A1C: 5.9

## 2023-05-05 LAB — TSH: TSH: 2.49 (ref 0.41–5.90)

## 2023-05-15 ENCOUNTER — Non-Acute Institutional Stay: Payer: Self-pay | Admitting: Sports Medicine

## 2023-05-15 ENCOUNTER — Encounter: Payer: Self-pay | Admitting: Sports Medicine

## 2023-05-15 DIAGNOSIS — S22000S Wedge compression fracture of unspecified thoracic vertebra, sequela: Secondary | ICD-10-CM | POA: Diagnosis not present

## 2023-05-15 DIAGNOSIS — Z8669 Personal history of other diseases of the nervous system and sense organs: Secondary | ICD-10-CM

## 2023-05-15 DIAGNOSIS — F419 Anxiety disorder, unspecified: Secondary | ICD-10-CM | POA: Diagnosis not present

## 2023-05-15 DIAGNOSIS — M1990 Unspecified osteoarthritis, unspecified site: Secondary | ICD-10-CM

## 2023-05-15 DIAGNOSIS — E785 Hyperlipidemia, unspecified: Secondary | ICD-10-CM

## 2023-05-15 NOTE — Progress Notes (Signed)
 Provider:  Dr. Venita Ochoa Location:  [facility] Place of Service:   Assisted living   PCP: Mast, Man X, NP Patient Care Team: Mast, Man X, NP as PCP - General (Internal Medicine) Mast, Man X, NP as Nurse Practitioner (Internal Medicine)  Extended Emergency Contact Information Primary Emergency Contact: Charlies Con[place] Mobile Phone: [phone] Relation: Friend  Goals of Care: Advanced Directive information    04/24/2023   12:27 PM  Advanced Directives  Does Patient Have a Medical Advance Directive? No         History of Present Illness         83 Yr old F with h/o OA, Seizure, HLD, Insomnia, Osteoporosis is seen today for admission to Assisted living .  Pt seen and examined in her room. She is laying on her bed watching TV.  Pt c/o pain in her back and reports that she wasn't sleeping well due to her mattress being hard.  Staff reports that when they check during the night she is sleeping well Pt is not a reliable historian. Pt has h/o GAD and staff informs me that some times pt refuses to taker her effexor. Denies chest pain, sob, abdominal pain, nausea, vomiting, dysuria, hematuria, bloody or dark stools.  Past Medical History:  Diagnosis Date   Anxiety    Cataract    Chronic constipation    Depression    Osteoporosis    Seizures ([name])    epilepsy  last seizure 1977   Past Surgical History:  Procedure Laterality Date   ABDOMINAL HYSTERECTOMY  1995   Dr. Carlis Abbott   CATARACT EXTRACTION Bilateral 12/2016   COLONOSCOPY  2012   patchy increased intraepithelial lymphocytes - draelos   ESOPHAGOGASTRODUODENOSCOPY     multiple   FISSURECTOMY     INTRAMEDULLARY (IM) NAIL INTERTROCHANTERIC Left 05/09/2021   Procedure: INTRAMEDULLARY (IM) NAIL INTERTROCHANTRIC;  Surgeon: Cammy Copa, MD;  Location: [facility] OR;  Service: Orthopedics;  Laterality: Left;   IR KYPHO EA ADDL LEVEL THORACIC OR LUMBAR  06/26/2021   IR KYPHO  THORACIC WITH BONE BIOPSY  06/26/2021   WRIST SURGERY Left    due to fracture    reports that she quit smoking about 42 years ago. Her smoking use included cigarettes. She started smoking about 67 years ago. She has a 25 pack-year smoking history. She has never used smokeless tobacco. She reports that she does not drink alcohol and does not use drugs. Social History   Socioeconomic History   Marital status: Widowed    Spouse name: Not on file   Number of children: 0   Years of education: Not on file   Highest education level: Some college, no degree  Occupational History   Occupation: Retired  Tobacco Use   Smoking status: Former    Current packs/day: 0.00    Average packs/day: 1 pack/day for 25.0 years (25.0 ttl pk-yrs)    Types: Cigarettes    Start date: 02/18/1956    Quit date: 02/17/1981    Years since quitting: 42.2   Smokeless tobacco: Never  Vaping Use   Vaping status: Never Used  Substance and Sexual Activity   Alcohol use: No    Alcohol/week: 0.0 standard drinks of alcohol   Drug use: No   Sexual activity: Not Currently  Other Topics Concern   Not on file  Social History Narrative   Social History     Marital status: Widowed  Spouse name:                        Years of education:  1 year college              Number of children:0           Widowed no children      Occupational History: Editor, commissioning, Adm. Therapist, music care, [employer].)     None on file      Social History Main Topics     Smoking status: Former Smoker                                             Packs/day: 1.00      Years: 0.00            Types: Cigarettes        Smokeless tobacco: Not on file                        Alcohol use: No               Drug use: No               Sexual activity: Not on file            Does not drink caffeine, but does eat chocolate.   Does live in an apartment ([address]) retirement community does exercise : walks daily   Right  handed         Social Drivers of Corporate investment banker Strain: Low Risk  (03/13/2017)   Overall Financial Resource Strain (CARDIA)    Difficulty of Paying Living Expenses: Not hard at all  Food Insecurity: No Food Insecurity (03/13/2017)   Hunger Vital Sign    Worried About Running Out of Food in the Last Year: Never true    Ran Out of Food in the Last Year: Never true  Transportation Needs: No Transportation Needs (03/13/2017)   PRAPARE - Administrator, Civil Service (Medical): No    Lack of Transportation (Non-Medical): No  Physical Activity: Sufficiently Active (03/13/2017)   Exercise Vital Sign    Days of Exercise per Week: 5 days    Minutes of Exercise per Session: 30 min  Stress: No Stress Concern Present (03/13/2017)   [employer] of Occupational Health - Occupational Stress Questionnaire    Feeling of Stress : Only a little  Social Connections: Somewhat Isolated (03/13/2017)   Social Connection and Isolation Panel [NHANES]    Frequency of Communication with Friends and Family: More than three times a week    Frequency of Social Gatherings with Friends and Family: More than three times a week    Attends Religious Services: More than 4 times per year    Active Member of [employer] or Organizations: No    Attends Banker Meetings: Never    Marital Status: Widowed  Intimate Partner Violence: Not At Risk (03/13/2017)   Humiliation, Afraid, Rape, and Kick questionnaire    Fear of Current or Ex-Partner: No    Emotionally Abused: No    Physically Abused: No    Sexually Abused: No    Functional Status Survey:    Family History  Problem Relation Age of Onset   Alcohol abuse Father  Diabetes Father    Diabetes Maternal Grandmother    Diabetes Paternal Grandmother    Colon cancer Maternal Grandfather    Esophageal cancer Neg Hx    Rectal cancer Neg Hx    Stomach cancer Neg Hx     Health Maintenance  Topic Date Due   OPHTHALMOLOGY EXAM   05/07/2022   HEMOGLOBIN A1C  12/03/2022   COVID-19 Vaccine (8 - Moderna risk 2024-25 season) 06/03/2023   FOOT EXAM  11/06/2023   Medicare Annual Wellness (AWV)  11/06/2023   Diabetic kidney evaluation - Urine ACR  11/11/2023   Diabetic kidney evaluation - eGFR measurement  04/23/2024   MAMMOGRAM  05/11/2024   DTaP/Tdap/Td (2 - Td or Tdap) 08/21/2027   Pneumonia Vaccine 32+ Years old  Completed   INFLUENZA VACCINE  Completed   DEXA SCAN  Completed   Zoster Vaccines- Shingrix  Completed   HPV VACCINES  Aged Out    Allergies  Allergen Reactions   Carbamazepine Other (See Comments)    HAS TO TAKE NAME BRAND ONLY TEGRETOL OR A SEIZURE OCCURS   Other Other (See Comments)    NO FOODS WITH Seeds -- Reflux   Cat Dander Swelling and Other (See Comments)    Patient states that it causes her eyes to swell shut   Cefaclor Other (See Comments)    Unknown reaction   Codeine Nausea Only   Fish Allergy Nausea Only   Iodinated Contrast Media Other (See Comments)    Unknown reaction     Penicillins Itching   Percocet [Oxycodone-Acetaminophen] Nausea And Vomiting   Red Dye #40 (Allura Red) Other (See Comments)    Reflux   Senna-Docusate Sodium [Sennosides-Docusate Sodium] Other (See Comments)    Severe stomach pains   Shellfish-Derived Products Nausea Only   Shrimp (Diagnostic) Nausea Only    Stomach pain    Tomato Other (See Comments)    Reflux   Hm Lidocaine Patch [Lidocaine] Rash    Says that this was related to putting a heating pad on the area with the lidocaine patch, has used lidocaine patches in other areas without a reaction    Outpatient Encounter Medications as of 05/15/2023  Medication Sig   acetaminophen (TYLENOL) 500 MG tablet Take 500-1,000 mg by mouth every 6 (six) hours as needed (for pain). (Patient not taking: Reported on 04/30/2023)   Artificial Tear Solution (SOOTHE XP) SOLN Place 1 drop into both eyes 3 (three) times daily.   atorvastatin (LIPITOR) 10 MG tablet  TAKE 1/2 TABLET DAILY   Calcium Carbonate-Vit D-Min (CALTRATE 600+D PLUS MINERALS) 600-800 MG-UNIT CHEW Chew 1 each by mouth 2 (two) times daily.   clonazePAM (KLONOPIN) 1 MG tablet *BOTTLE* TAKE 1 TABLET BY MOUTH EVERY NIGHT AT BEDTIME *HAZARDOUS DRUG: WEAR GLOVES* *DO NOT CRUSH* [phone] CALL TO LET HER KNOW THE DAY WE COME   HYDROcodone-acetaminophen (NORCO/VICODIN) 5-325 MG tablet Take 1-2 tablets by mouth every 6 (six) hours as needed.   lidocaine 4 % Place 1 patch onto the skin daily.   Multiple Vitamins-Minerals (PRESERVISION AREDS 2) CAPS Take 1 capsule by mouth in the morning and at bedtime.   Nutritional Supplements (ENSURE MAX PROTEIN PO) Take 1 Bottle by mouth See admin instructions. Drink 1 bottle by mouth once a day, alternating with one bottle of Ensure original formula every other day   Nutritional Supplements (ENSURE ORIGINAL) LIQD Take 1 Bottle by mouth See admin instructions. Drink 1 bottle by mouth every other day- alternating with one bottle of  Ensure High Protein formula every other day   Polyethyl Glycol-Propyl Glycol (SYSTANE) 0.4-0.3 % GEL ophthalmic gel Place 1 Application into both eyes at bedtime.   polyethylene glycol powder (GLYCOLAX/MIRALAX) 17 GM/SCOOP powder Take 17 g by mouth daily.   TEGRETOL 200 MG tablet TAKE 1 AND 1/2 TABLETS BY MOUTH AT BEDTIME   venlafaxine XR (EFFEXOR XR) 37.5 MG 24 hr capsule Take 1 capsule (37.5 mg total) by mouth daily with breakfast. (Patient not taking: Reported on 04/24/2023)   Vitamin D, Ergocalciferol, 50000 units CAPS TAKE ONE CAPSULE BY MOUTH ONCE A WEEK   No facility-administered encounter medications on file as of 05/15/2023.    Review of Systems  Constitutional:  Negative for fever.  HENT:  Negative for sore throat.   Respiratory:  Negative for cough, shortness of breath and wheezing.   Cardiovascular:  Negative for chest pain, palpitations and leg swelling.  Gastrointestinal:  Negative for abdominal distention, abdominal  pain, blood in stool, constipation, diarrhea, nausea and vomiting.  Genitourinary:  Negative for dysuria, frequency and urgency.  Musculoskeletal:  Positive for back pain.  Neurological:  Negative for dizziness.  Psychiatric/Behavioral:  Positive for sleep disturbance.    Negative unless indicated in HPI.  There were no vitals filed for this visit. There is no height or weight on file to calculate BMI. BP Readings from Last 3 Encounters:  04/30/23 110/68  04/24/23 (!) 143/67  04/23/23 (!) 145/73   Wt Readings from Last 3 Encounters:  04/30/23 103 lb (46.7 kg)  04/23/23 100 lb (45.4 kg)  02/05/23 103 lb 12.8 oz (47.1 kg)   Physical Exam Constitutional:      Appearance: Normal appearance.  HENT:     Head: Normocephalic and atraumatic.  Cardiovascular:     Rate and Rhythm: Normal rate and regular rhythm.  Pulmonary:     Effort: Pulmonary effort is normal. No respiratory distress.     Breath sounds: Normal breath sounds. No wheezing.  Abdominal:     General: Bowel sounds are normal. There is no distension.     Tenderness: There is no abdominal tenderness. There is no guarding or rebound.     Comments:    Musculoskeletal:        General: No swelling.  Neurological:     Mental Status: She is alert. Mental status is at baseline.     Motor: No weakness.     Labs reviewed: Basic Metabolic Panel: Recent Labs    01/13/23 0725 04/23/23 1614 04/24/23 1613  NA 141 140 141  K 3.8 4.1 3.9  CL 103 105 104  CO2 28 25 27   GLUCOSE 110* 108* 118*  BUN 19 26* 28*  CREATININE 0.67 0.55 0.61  CALCIUM 9.4 9.6 9.3   Liver Function Tests: Recent Labs    06/03/22 0718 11/11/22 0725 04/23/23 1614  AST 26 22 29   ALT 20 19 25   ALKPHOS  --   --  39  BILITOT 0.6 0.6 0.5  PROT 6.3 6.3 6.6  ALBUMIN  --   --  4.3   No results for input(s): "LIPASE", "AMYLASE" in the last 8760 hours. No results for input(s): "AMMONIA" in the last 8760 hours. CBC: Recent Labs    06/03/22 0718  01/13/23 0725 04/23/23 1614 04/24/23 1613  WBC 4.6 5.4 9.1 8.1  NEUTROABS 2,571  --  7.3  --   HGB 13.3 14.0 13.8 13.2  HCT 40.9 41.8 41.6 41.2  MCV 96.5 99.8 101.5* 102.0*  PLT 227 207 177  172   Cardiac Enzymes: No results for input(s): "CKTOTAL", "CKMB", "CKMBINDEX", "TROPONINI" in the last 8760 hours. BNP: Invalid input(s): "POCBNP" Lab Results  Component Value Date   HGBA1C 6.2 (H) 06/03/2022   Lab Results  Component Value Date   TSH 3.40 06/03/2022   No results found for: "VITAMINB12" No results found for: "FOLATE" No results found for: "IRON", "TIBC", "FERRITIN"  Imaging and Procedures obtained prior to SNF admission: CT Lumbar Spine Wo Contrast Result Date: 04/24/2023 CLINICAL DATA:  Fall yesterday and today.  Back pain. EXAM: CT LUMBAR SPINE WITHOUT CONTRAST TECHNIQUE: Multidetector CT imaging of the lumbar spine was performed without intravenous contrast administration. Multiplanar CT image reconstructions were also generated. RADIATION DOSE REDUCTION: This exam was performed according to the departmental dose-optimization program which includes automated exposure control, adjustment of the mA and/or kV according to patient size and/or use of iterative reconstruction technique. COMPARISON:  MRI lumbar spine 06/23/2021. FINDINGS: Segmentation: 5 non rib-bearing lumbar type vertebral bodies are present. The lowest fully formed vertebral body is L5. Alignment: No significant listhesis is present. Lumbar lordosis is preserved. Rightward curvature is centered at L2-3. Vertebrae: Remote spinal augmentation at L1 is noted. Vertebral body heights are otherwise normal. No other acute scratched at no acute fracture is present. Paraspinal and other soft tissues: Atherosclerotic calcifications are present in the aorta without aneurysm. A 4.2 cm simple cyst is present at the upper pole of the right kidney. The visualized abdomen is otherwise within normal limits. No significant adenopathy is  present. Disc levels: No significant focal disc protrusion or stenosis is present in lumbar spine. Facet hypertrophy is present bilaterally at L5-S1. IMPRESSION: 1. No acute fracture or traumatic subluxation. 2. Remote spinal augmentation at L1. 3. Rightward curvature of the lumbar spine centered at L2-3. 4. Facet hypertrophy bilaterally at L5-S1. 5. 4.2 cm simple cyst at the upper pole of the right kidney. Electronically Signed   By: Marin Roberts M.D.   On: 04/24/2023 18:15   CT Thoracic Spine Wo Contrast Result Date: 04/24/2023 CLINICAL DATA:  Fall yesterday. Unwitnessed fall with trauma to head. Dizziness. EXAM: CT THORACIC SPINE WITHOUT CONTRAST TECHNIQUE: Multidetector CT images of the thoracic were obtained using the standard protocol without intravenous contrast. RADIATION DOSE REDUCTION: This exam was performed according to the departmental dose-optimization program which includes automated exposure control, adjustment of the mA and/or kV according to patient size and/or use of iterative reconstruction technique. COMPARISON:  MRI of the thoracic spine 06/23/2021. FINDINGS: Alignment: No significant listhesis is present. Thoracic kyphosis is stable. Mild leftward curvature of the thoracic spine is centered at T10. Vertebrae: Acute superior endplate fracture is present at T6 the vertebral body height is reduced to 10 mm. This compares to 14 mm at T5. No other acute fractures are present. Spinal augmentation is noted at T8 and L1. Paraspinous soft tissues: Minimal soft tissue density adjacent to the T6 vertebral body is likely posttraumatic. Paraspinous soft tissues are otherwise within normal limits. Descending thoracic aortic calcifications are present without aneurysm. The visualized lung fields are clear. No significant pleural effusion or pneumothorax is present. The upper abdomen is within normal limits. Disc levels: A left paramedian disc protrusion partially effaces the left ventral CSF at  T9-10. No other significant central disc disease or stenosis is present. Osseous foraminal narrowing is present on the right at T8-9. The foramina are otherwise patent bilaterally. IMPRESSION: 1. Acute superior endplate fracture at T6 with 10 mm of vertebral body height loss. 2. No  other acute fractures. 3. Spinal augmentation at T8 and L1. 4. Left paramedian disc protrusion at T9-10 partially effaces the left ventral CSF. 5. Osseous foraminal narrowing on the right at T8-9. Electronically Signed   By: Marin Roberts M.D.   On: 04/24/2023 18:07    Assessment and Plan   GAD  Doing fine Cont wit effexor , clonazepam  Monitor for drowsiness, sedation   Compression fracture T6  Para spinal tenderness Cont with hydrocodone , tylenol prn  Use lidocaine patch   Seizure disorder Cont with tegretol  OA  Take tylenol prn for pain    HLD  Cont with lipitor           30 min Total time spent for obtaining history,  performing a medically appropriate examination and evaluation, reviewing the tests,  documenting clinical information in the electronic or other health record,   ,care coordination (not separately reported)     Family/ staff Communication:   Labs/tests ordered:  Erin Ochoa

## 2023-05-18 DIAGNOSIS — R41841 Cognitive communication deficit: Secondary | ICD-10-CM | POA: Diagnosis not present

## 2023-05-19 ENCOUNTER — Non-Acute Institutional Stay: Payer: Self-pay | Admitting: Adult Health

## 2023-05-19 ENCOUNTER — Other Ambulatory Visit: Payer: Medicare Other

## 2023-05-19 ENCOUNTER — Encounter: Payer: Self-pay | Admitting: Adult Health

## 2023-05-19 DIAGNOSIS — F32A Depression, unspecified: Secondary | ICD-10-CM | POA: Diagnosis not present

## 2023-05-19 DIAGNOSIS — F5101 Primary insomnia: Secondary | ICD-10-CM

## 2023-05-19 DIAGNOSIS — R569 Unspecified convulsions: Secondary | ICD-10-CM

## 2023-05-19 DIAGNOSIS — M549 Dorsalgia, unspecified: Secondary | ICD-10-CM

## 2023-05-19 MED ORDER — HYDROCODONE-ACETAMINOPHEN 5-325 MG PO TABS: 0.5000 | ORAL_TABLET | Freq: Two times a day (BID) | ORAL | 0 refills | Status: DC | PRN

## 2023-05-19 MED ORDER — ACETAMINOPHEN 500 MG PO TABS: 1000.0000 mg | ORAL_TABLET | Freq: Three times a day (TID) | ORAL | Status: DC | PRN

## 2023-05-19 MED ORDER — CLONAZEPAM 1 MG PO TABS: 1.5000 mg | ORAL_TABLET | Freq: Every day | ORAL | Status: DC

## 2023-05-19 NOTE — Progress Notes (Signed)
 Location:  [facility] Room Number: 812 A Place of Service:  ALF (13) Provider:  Kenard Gower, DNP, FNP-BC  Patient Care Team: Mast, Man X, NP as PCP - General (Internal Medicine) Mast, Man X, NP as Nurse Practitioner (Internal Medicine)  Extended Emergency Contact Information Primary Emergency Contact: Charlies Con[place] Mobile Phone: [phone] Relation: Friend  Code Status:   DNR  Goals of care: Advanced Directive information    04/24/2023   12:27 PM  Advanced Directives  Does Patient Have a Medical Advance Directive? No     Chief Complaint  Patient presents with   Acute Visit    Refusing medications    HPI:  Pt is a 83 y.o. female seen today for an acute visit regarding medication refusals and pain management.  She is refusing to take Norco, lidocaine patch, and Effexor. The lidocaine patch causes a burning sensation, leading to her refusal. She has not taken Effexor XR 37.5 mg daily for a month and denies feeling depressed. She is concerned about dizziness from taking Norco with clonazepam, which she uses for sleep.  She experiences left back pain, which she attributes to her history of falls. She fell on April 24, 2023, due to a dizzy episode. Imaging from March 7 showed no acute fracture or traumatic subluxation in the lumbar spine, but a CT of the thoracic spine revealed an acute superior endplate fracture at T6 with 10 mm of vertebral body height loss. She rates her back pain as 'bad' and states that Tylenol 1000 mg every six hours as needed does not help much. She ambulates independently.  She has a history of seizures and takes Tegretol 300 mg daily. Her last seizure episode was on April 23, 2023, which resulted in a hospital visit. She recalls having dream episodes in high school and was diagnosed with seizures. At 21, she had a grand mal seizure and was started on Dilantin for 25 years before switching to  Tegretol due to immune system concerns.  She has difficulty sleeping and takes clonazepam 1 mg, 1.5 tablets by mouth at bedtime. She needs clonazepam to sleep and reports getting dizzy if she does not sleep well. She is a widow and resides in an assisted living facility.   Past Medical History:  Diagnosis Date   Anxiety    Cataract    Chronic constipation    Depression    Osteoporosis    Seizures ([name])    epilepsy  last seizure 1977   Past Surgical History:  Procedure Laterality Date   ABDOMINAL HYSTERECTOMY  1995   Dr. Carlis Abbott   CATARACT EXTRACTION Bilateral 12/2016   COLONOSCOPY  2012   patchy increased intraepithelial lymphocytes - draelos   ESOPHAGOGASTRODUODENOSCOPY     multiple   FISSURECTOMY     INTRAMEDULLARY (IM) NAIL INTERTROCHANTERIC Left 05/09/2021   Procedure: INTRAMEDULLARY (IM) NAIL INTERTROCHANTRIC;  Surgeon: Cammy Copa, MD;  Location: [facility] OR;  Service: Orthopedics;  Laterality: Left;   IR KYPHO EA ADDL LEVEL THORACIC OR LUMBAR  06/26/2021   IR KYPHO THORACIC WITH BONE BIOPSY  06/26/2021   WRIST SURGERY Left    due to fracture    Allergies  Allergen Reactions   Carbamazepine Other (See Comments)    HAS TO TAKE NAME BRAND ONLY TEGRETOL OR A SEIZURE OCCURS   Other Other (See Comments)    NO FOODS WITH Seeds -- Reflux   Cat Dander Swelling and Other (See Comments)  Patient states that it causes her eyes to swell shut   Cefaclor Other (See Comments)    Unknown reaction   Codeine Nausea Only   Fish Allergy Nausea Only   Iodinated Contrast Media Other (See Comments)    Unknown reaction     Penicillins Itching   Percocet [Oxycodone-Acetaminophen] Nausea And Vomiting   Red Dye #40 (Allura Red) Other (See Comments)    Reflux   Senna-Docusate Sodium [Sennosides-Docusate Sodium] Other (See Comments)    Severe stomach pains   Shellfish-Derived Products Nausea Only   Shrimp (Diagnostic) Nausea Only    Stomach pain    Tomato Other (See  Comments)    Reflux   Hm Lidocaine Patch [Lidocaine] Rash    Says that this was related to putting a heating pad on the area with the lidocaine patch, has used lidocaine patches in other areas without a reaction    Outpatient Encounter Medications as of 05/19/2023  Medication Sig   acetaminophen (TYLENOL) 500 MG tablet Take 500-1,000 mg by mouth every 6 (six) hours as needed (for pain). (Patient not taking: Reported on 04/30/2023)   Artificial Tear Solution (SOOTHE XP) SOLN Place 1 drop into both eyes 3 (three) times daily.   atorvastatin (LIPITOR) 10 MG tablet TAKE 1/2 TABLET DAILY   Calcium Carbonate-Vit D-Min (CALTRATE 600+D PLUS MINERALS) 600-800 MG-UNIT CHEW Chew 1 each by mouth 2 (two) times daily.   clonazePAM (KLONOPIN) 1 MG tablet *BOTTLE* TAKE 1 TABLET BY MOUTH EVERY NIGHT AT BEDTIME *HAZARDOUS DRUG: WEAR GLOVES* *DO NOT CRUSH* [phone] CALL TO LET HER KNOW THE DAY WE COME   HYDROcodone-acetaminophen (NORCO/VICODIN) 5-325 MG tablet Take 1-2 tablets by mouth every 6 (six) hours as needed.   lidocaine 4 % Place 1 patch onto the skin daily.   Multiple Vitamins-Minerals (PRESERVISION AREDS 2) CAPS Take 1 capsule by mouth in the morning and at bedtime.   Nutritional Supplements (ENSURE MAX PROTEIN PO) Take 1 Bottle by mouth See admin instructions. Drink 1 bottle by mouth once a day, alternating with one bottle of Ensure original formula every other day   Nutritional Supplements (ENSURE ORIGINAL) LIQD Take 1 Bottle by mouth See admin instructions. Drink 1 bottle by mouth every other day- alternating with one bottle of Ensure High Protein formula every other day   Polyethyl Glycol-Propyl Glycol (SYSTANE) 0.4-0.3 % GEL ophthalmic gel Place 1 Application into both eyes at bedtime.   polyethylene glycol powder (GLYCOLAX/MIRALAX) 17 GM/SCOOP powder Take 17 g by mouth daily.   TEGRETOL 200 MG tablet TAKE 1 AND 1/2 TABLETS BY MOUTH AT BEDTIME   Vitamin D, Ergocalciferol, 50000 units CAPS TAKE ONE  CAPSULE BY MOUTH ONCE A WEEK   [DISCONTINUED] venlafaxine XR (EFFEXOR XR) 37.5 MG 24 hr capsule Take 1 capsule (37.5 mg total) by mouth daily with breakfast. (Patient not taking: Reported on 04/24/2023)   No facility-administered encounter medications on file as of 05/19/2023.    Review of Systems  Constitutional:  Negative for appetite change, chills, fatigue and fever.  HENT:  Negative for congestion, hearing loss, rhinorrhea and sore throat.   Eyes: Negative.   Respiratory:  Negative for cough, shortness of breath and wheezing.   Cardiovascular:  Negative for chest pain, palpitations and leg swelling.  Gastrointestinal:  Negative for abdominal pain, constipation, diarrhea, nausea and vomiting.  Genitourinary:  Negative for dysuria.  Musculoskeletal:  Positive for back pain. Negative for arthralgias and myalgias.  Skin:  Negative for color change, rash and wound.  Neurological:  Negative for dizziness, weakness and headaches.  Psychiatric/Behavioral:  Positive for sleep disturbance. Negative for behavioral problems. The patient is not nervous/anxious.      Immunization History  Administered Date(s) Administered   Fluad Quad(high Dose 65+) 12/03/2020, 12/17/2022   Fluad Trivalent(High Dose 65+) 12/17/2022   Influenza Whole 11/19/2017   Influenza, High Dose Seasonal PF 11/26/2016, 12/01/2018, 11/30/2019, 11/21/2021   Influenza-Unspecified 11/18/2014, 11/29/2015   Moderna Covid-19 Fall Seasonal Vaccine 32yrs & older 12/03/2022   Moderna Covid-19 Vaccine Bivalent Booster 31yrs & up 07/05/2021   Moderna Sars-Covid-2 Vaccination 02/21/2019, 03/21/2019, 12/27/2019, 06/26/2020   Pfizer Covid-19 Vaccine Bivalent Booster 20yrs & up 11/06/2020   Pneumococcal Conjugate-13 09/24/2017   Pneumococcal Polysaccharide-23 12/30/2012, 09/18/2018   Respiratory Syncytial Virus Vaccine,Recomb Aduvanted(Arexvy) 02/01/2022, 02/06/2022   Tdap 08/20/2017   Zoster Recombinant(Shingrix) 07/21/2017, 12/08/2017    Zoster, Live 05/28/2007   Pertinent  Health Maintenance Due  Topic Date Due   OPHTHALMOLOGY EXAM  05/07/2022   HEMOGLOBIN A1C  12/03/2022   INFLUENZA VACCINE  09/18/2023   FOOT EXAM  11/06/2023   MAMMOGRAM  05/11/2024   DEXA SCAN  Completed      12/11/2022   12:57 PM 01/02/2023   10:47 AM 01/09/2023   10:38 AM 02/05/2023    2:39 PM 04/30/2023    1:47 PM  Fall Risk  Falls in the past year? 0 0 0 0 1  Was there an injury with Fall? 0 0 0 0 1  Fall Risk Category Calculator 0 0 0 0 3  Patient at Risk for Falls Due to No Fall Risks No Fall Risks No Fall Risks  History of fall(s)  Fall risk Follow up Falls evaluation completed Falls evaluation completed Falls evaluation completed;Education provided;Falls prevention discussed  Falls evaluation completed     Vitals:   05/19/23 1249  BP: (!) 131/52  Pulse: 85  Resp: 18  Temp: 98.6 F (37 C)  SpO2: 95%  Weight: 101 lb 3.2 oz (45.9 kg)  Height: 5\' 5"  (1.651 m)   Body mass index is 16.84 kg/m.  Physical Exam Constitutional:      General: She is not in acute distress. HENT:     Head: Normocephalic and atraumatic.     Nose: Nose normal.     Mouth/Throat:     Mouth: Mucous membranes are moist.  Eyes:     Conjunctiva/sclera: Conjunctivae normal.  Cardiovascular:     Rate and Rhythm: Normal rate and regular rhythm.  Pulmonary:     Effort: Pulmonary effort is normal.     Breath sounds: Normal breath sounds.  Abdominal:     General: Bowel sounds are normal.     Palpations: Abdomen is soft.  Musculoskeletal:        General: Normal range of motion.     Cervical back: Normal range of motion.  Skin:    General: Skin is warm and dry.  Neurological:     General: No focal deficit present.     Mental Status: She is alert and oriented to person, place, and time.  Psychiatric:        Mood and Affect: Mood normal.        Behavior: Behavior normal.       Labs reviewed: Recent Labs    01/13/23 0725 04/23/23 1614  04/24/23 1613  NA 141 140 141  K 3.8 4.1 3.9  CL 103 105 104  CO2 28 25 27   GLUCOSE 110* 108* 118*  BUN 19 26* 28*  CREATININE 0.67  0.55 0.61  CALCIUM 9.4 9.6 9.3   Recent Labs    06/03/22 0718 11/11/22 0725 04/23/23 1614  AST 26 22 29   ALT 20 19 25   ALKPHOS  --   --  39  BILITOT 0.6 0.6 0.5  PROT 6.3 6.3 6.6  ALBUMIN  --   --  4.3   Recent Labs    06/03/22 0718 01/13/23 0725 04/23/23 1614 04/24/23 1613  WBC 4.6 5.4 9.1 8.1  NEUTROABS 2,571  --  7.3  --   HGB 13.3 14.0 13.8 13.2  HCT 40.9 41.8 41.6 41.2  MCV 96.5 99.8 101.5* 102.0*  PLT 227 207 177 172   Lab Results  Component Value Date   TSH 3.40 06/03/2022   Lab Results  Component Value Date   HGBA1C 6.2 (H) 06/03/2022   Lab Results  Component Value Date   CHOL 153 06/03/2022   HDL 51 06/03/2022   LDLCALC 81 06/03/2022   TRIG 112 06/03/2022   CHOLHDL 3.0 06/03/2022    Significant Diagnostic Results in last 30 days:  CT Lumbar Spine Wo Contrast Result Date: 04/24/2023 CLINICAL DATA:  Fall yesterday and today.  Back pain. EXAM: CT LUMBAR SPINE WITHOUT CONTRAST TECHNIQUE: Multidetector CT imaging of the lumbar spine was performed without intravenous contrast administration. Multiplanar CT image reconstructions were also generated. RADIATION DOSE REDUCTION: This exam was performed according to the departmental dose-optimization program which includes automated exposure control, adjustment of the mA and/or kV according to patient size and/or use of iterative reconstruction technique. COMPARISON:  MRI lumbar spine 06/23/2021. FINDINGS: Segmentation: 5 non rib-bearing lumbar type vertebral bodies are present. The lowest fully formed vertebral body is L5. Alignment: No significant listhesis is present. Lumbar lordosis is preserved. Rightward curvature is centered at L2-3. Vertebrae: Remote spinal augmentation at L1 is noted. Vertebral body heights are otherwise normal. No other acute scratched at no acute fracture is  present. Paraspinal and other soft tissues: Atherosclerotic calcifications are present in the aorta without aneurysm. A 4.2 cm simple cyst is present at the upper pole of the right kidney. The visualized abdomen is otherwise within normal limits. No significant adenopathy is present. Disc levels: No significant focal disc protrusion or stenosis is present in lumbar spine. Facet hypertrophy is present bilaterally at L5-S1. IMPRESSION: 1. No acute fracture or traumatic subluxation. 2. Remote spinal augmentation at L1. 3. Rightward curvature of the lumbar spine centered at L2-3. 4. Facet hypertrophy bilaterally at L5-S1. 5. 4.2 cm simple cyst at the upper pole of the right kidney. Electronically Signed   By: Marin Roberts M.D.   On: 04/24/2023 18:15   CT Thoracic Spine Wo Contrast Result Date: 04/24/2023 CLINICAL DATA:  Fall yesterday. Unwitnessed fall with trauma to head. Dizziness. EXAM: CT THORACIC SPINE WITHOUT CONTRAST TECHNIQUE: Multidetector CT images of the thoracic were obtained using the standard protocol without intravenous contrast. RADIATION DOSE REDUCTION: This exam was performed according to the departmental dose-optimization program which includes automated exposure control, adjustment of the mA and/or kV according to patient size and/or use of iterative reconstruction technique. COMPARISON:  MRI of the thoracic spine 06/23/2021. FINDINGS: Alignment: No significant listhesis is present. Thoracic kyphosis is stable. Mild leftward curvature of the thoracic spine is centered at T10. Vertebrae: Acute superior endplate fracture is present at T6 the vertebral body height is reduced to 10 mm. This compares to 14 mm at T5. No other acute fractures are present. Spinal augmentation is noted at T8 and L1. Paraspinous soft tissues: Minimal  soft tissue density adjacent to the T6 vertebral body is likely posttraumatic. Paraspinous soft tissues are otherwise within normal limits. Descending thoracic aortic  calcifications are present without aneurysm. The visualized lung fields are clear. No significant pleural effusion or pneumothorax is present. The upper abdomen is within normal limits. Disc levels: A left paramedian disc protrusion partially effaces the left ventral CSF at T9-10. No other significant central disc disease or stenosis is present. Osseous foraminal narrowing is present on the right at T8-9. The foramina are otherwise patent bilaterally. IMPRESSION: 1. Acute superior endplate fracture at T6 with 10 mm of vertebral body height loss. 2. No other acute fractures. 3. Spinal augmentation at T8 and L1. 4. Left paramedian disc protrusion at T9-10 partially effaces the left ventral CSF. 5. Osseous foraminal narrowing on the right at T8-9. Electronically Signed   By: Marin Roberts M.D.   On: 04/24/2023 18:07   CT Head Wo Contrast Result Date: 04/23/2023 CLINICAL DATA:  Seizure, fall with head trauma. EXAM: CT HEAD WITHOUT CONTRAST CT CERVICAL SPINE WITHOUT CONTRAST TECHNIQUE: Multidetector CT imaging of the head and cervical spine was performed following the standard protocol without intravenous contrast. Multiplanar CT image reconstructions of the cervical spine were also generated. RADIATION DOSE REDUCTION: This exam was performed according to the departmental dose-optimization program which includes automated exposure control, adjustment of the mA and/or kV according to patient size and/or use of iterative reconstruction technique. COMPARISON:  06/21/2021 FINDINGS: CT HEAD FINDINGS Brain: No acute intracranial hemorrhage. No CT evidence of acute infarct. No edema, mass effect, or midline shift. The basilar cisterns are patent. Ventricles: Prominence of the ventricles suggesting underlying parenchymal volume loss. Vascular: No hyperdense vessel or unexpected calcification. Skull: No acute or aggressive finding. Orbits: Orbits are symmetric. Sinuses: The visualized paranasal sinuses are clear. Other:  Laceration and mild soft tissue swelling in the right parieto-occipital scalp. Mastoid air cells are clear CT CERVICAL SPINE FINDINGS Alignment: Cervical lordosis is maintained. Trace retrolisthesis of C5 on C6. No facet subluxation or dislocation. Skull base and vertebrae: No compression fracture or displaced fracture in the cervical spine. No suspicious osseous lesion. Soft tissues and spinal canal: No prevertebral fluid or swelling. No visible canal hematoma. 2.0 cm nodule involving the inferior aspect of the right thyroid lobe and isthmus is present since 2011 at which time it measured 1.7 cm. Disc levels: Moderate to severe disc space narrowing at C5-6. Disc bulges at multiple levels without high-grade spinal canal stenosis. Disc osteophyte complex at C5-6 without high-grade spinal canal stenosis. Facet arthrosis greatest on the left at C4-5. Upper chest: Biapical pleuroparenchymal scarring and pleural calcifications. Other: None. IMPRESSION: 1. No acute intracranial abnormality. 2. Laceration and mild soft tissue swelling in the right parieto-occipital scalp. 3. No acute fracture or traumatic malalignment in the cervical spine. 4. Multilevel degenerative changes of the cervical spine, as described above. 5. 2.0 cm nodule involving the inferior aspect of the right thyroid lobe and isthmus, slightly increased in size from 2011. Consider thyroid ultrasound for further evaluation if not previously performed. Electronically Signed   By: Emily Filbert M.D.   On: 04/23/2023 13:28   CT Cervical Spine Wo Contrast Result Date: 04/23/2023 CLINICAL DATA:  Seizure, fall with head trauma. EXAM: CT HEAD WITHOUT CONTRAST CT CERVICAL SPINE WITHOUT CONTRAST TECHNIQUE: Multidetector CT imaging of the head and cervical spine was performed following the standard protocol without intravenous contrast. Multiplanar CT image reconstructions of the cervical spine were also generated. RADIATION DOSE REDUCTION: This  exam was performed  according to the departmental dose-optimization program which includes automated exposure control, adjustment of the mA and/or kV according to patient size and/or use of iterative reconstruction technique. COMPARISON:  06/21/2021 FINDINGS: CT HEAD FINDINGS Brain: No acute intracranial hemorrhage. No CT evidence of acute infarct. No edema, mass effect, or midline shift. The basilar cisterns are patent. Ventricles: Prominence of the ventricles suggesting underlying parenchymal volume loss. Vascular: No hyperdense vessel or unexpected calcification. Skull: No acute or aggressive finding. Orbits: Orbits are symmetric. Sinuses: The visualized paranasal sinuses are clear. Other: Laceration and mild soft tissue swelling in the right parieto-occipital scalp. Mastoid air cells are clear CT CERVICAL SPINE FINDINGS Alignment: Cervical lordosis is maintained. Trace retrolisthesis of C5 on C6. No facet subluxation or dislocation. Skull base and vertebrae: No compression fracture or displaced fracture in the cervical spine. No suspicious osseous lesion. Soft tissues and spinal canal: No prevertebral fluid or swelling. No visible canal hematoma. 2.0 cm nodule involving the inferior aspect of the right thyroid lobe and isthmus is present since 2011 at which time it measured 1.7 cm. Disc levels: Moderate to severe disc space narrowing at C5-6. Disc bulges at multiple levels without high-grade spinal canal stenosis. Disc osteophyte complex at C5-6 without high-grade spinal canal stenosis. Facet arthrosis greatest on the left at C4-5. Upper chest: Biapical pleuroparenchymal scarring and pleural calcifications. Other: None. IMPRESSION: 1. No acute intracranial abnormality. 2. Laceration and mild soft tissue swelling in the right parieto-occipital scalp. 3. No acute fracture or traumatic malalignment in the cervical spine. 4. Multilevel degenerative changes of the cervical spine, as described above. 5. 2.0 cm nodule involving the  inferior aspect of the right thyroid lobe and isthmus, slightly increased in size from 2011. Consider thyroid ultrasound for further evaluation if not previously performed. Electronically Signed   By: Emily Filbert M.D.   On: 04/23/2023 13:28    Assessment/Plan  1. Upper back pain (Primary) -  Experiencing severe left upper back pain due to an acute superior endplate fracture at T6 with 10 mm vertebral body height loss from a fall on April 24, 2023. Refuses Norco due to dizziness concerns when combined with clonazepam. Tylenol 1000 mg every six hours as needed is ineffective. Lidocaine patch causes a burning sensation.   - Discontinue lidocaine patch due to burning sensation.   - Discontinue Effexor as she denies depression and has not taken it for a month.   - Initiate Norco 5-325 mg, half tablet, twice daily PRN for pain, with Tylenol 1000 mg every eight hours as needed.   - Avoid administering Norco within two hours of clonazepam.   - acetaminophen (TYLENOL) 500 MG tablet; Take 2 tablets (1,000 mg total) by mouth every 8 (eight) hours as needed (for pain). - HYDROcodone-acetaminophen (NORCO/VICODIN) 5-325 MG tablet; Take 0.5 tablets by mouth 2 (two) times daily as needed.  Dispense: 15 tablet; Refill: 0  2. Primary insomnia -  Experiencing difficulty sleeping, managed with clonazepam 1 mg, 1.5 tablets at bedtime. Concerned about dizziness and falls if clonazepam is taken with Norco. Requires clonazepam for sleep as lack of sleep induces dizziness.   - Continue clonazepam 1 mg, 1.5 tablets at bedtime for sleep.  -  clonazepam (KLONOPIN) 1 MG tablet; Take 1.5 tablets (1.5 mg total) by mouth at bedtime  3. Seizure ([name]) -  Seizure disorder with last episode on April 23, 2023. Managed with Tegretol 300 mg daily. -Seizure precautions  4. Depression, unspecified depression type -  Denies  depression or -  Discontinue Effexor        Family/ staff Communication: Discussed plan of care with  resident and charge nurse.  Labs/tests ordered: None    Kenard Gower, DNP, MSN, FNP-BC [facility] [phone] (Monday-Friday 8:00 a.m. - 5:00 p.m.) [phone] (after hours)

## 2023-05-21 DIAGNOSIS — R41841 Cognitive communication deficit: Secondary | ICD-10-CM | POA: Diagnosis not present

## 2023-05-22 ENCOUNTER — Encounter: Payer: Self-pay | Admitting: Sports Medicine

## 2023-05-22 ENCOUNTER — Non-Acute Institutional Stay: Payer: Self-pay | Admitting: Sports Medicine

## 2023-05-22 DIAGNOSIS — F411 Generalized anxiety disorder: Secondary | ICD-10-CM | POA: Diagnosis not present

## 2023-05-22 DIAGNOSIS — S22000S Wedge compression fracture of unspecified thoracic vertebra, sequela: Secondary | ICD-10-CM

## 2023-05-22 DIAGNOSIS — F5101 Primary insomnia: Secondary | ICD-10-CM | POA: Diagnosis not present

## 2023-05-22 NOTE — Progress Notes (Signed)
 Location:  [facility] Room Number: [id number] Place of Service:  ALF [phone]) Provider:  [name], MD    Patient Care Team: Mast, Man X, NP as PCP - General (Internal Medicine) Mast, Man X, NP as Nurse Practitioner (Internal Medicine)  Extended Emergency Contact Information Primary Emergency Contact: Charlies [place] Mobile Phone: [phone] Relation: Friend  Code Status:  DNR Goals of care: Advanced Directive information    05/22/2023    3:03 PM  Advanced Directives  Does Patient Have a Medical Advance Directive? Yes  Type of Estate agent of [place];Living will;Out of facility DNR (pink MOST or yellow form)  Does patient want to make changes to medical advance directive? No - Patient declined  Copy of Healthcare Power of Attorney in Chart? Yes - validated most recent copy scanned in chart (See row information)  Pre-existing out of facility DNR order (yellow form or pink MOST form) Yellow form placed in chart (order not valid for inpatient use)     Chief Complaint  Patient presents with   Acute Visit    Left back Pain    HPI:  Pt is a 83 y.o. female seen today for an acute visit for low back pain  The patient presents with persistent back pain due to a compression fracture.  She experiences severe and constant back pain radiating from a known compression fracture, confirmed by previous imaging including x-rays and a CT scan. The pain does not radiate down her legs.  Able to ambulate with the help of walker.  She is allergic to pain patches and has been using Tylenol and Norco for pain management. Tylenol, taken at a dose of 1000 mg three times a day, has not been effective. She takes Norco, half a tablet in the morning and half at night, but expresses concern about dependency.  She reports difficulty sleeping, which she associates with an increased risk of seizures. She takes clonazepam and has  attempted to reduce the dose, but experienced a seizure when doing so. She currently takes one and a half tablets at a time.  She has been advised to take Effexor for anxiety which she has been refusing to take.   Past Medical History:  Diagnosis Date   Anxiety    Cataract    Chronic constipation    Depression    Osteoporosis    Seizures ([name])    epilepsy  last seizure 1977   Past Surgical History:  Procedure Laterality Date   ABDOMINAL HYSTERECTOMY  1995   Dr. Carlis Abbott   CATARACT EXTRACTION Bilateral 12/2016   COLONOSCOPY  2012   patchy increased intraepithelial lymphocytes - draelos   ESOPHAGOGASTRODUODENOSCOPY     multiple   FISSURECTOMY     INTRAMEDULLARY (IM) NAIL INTERTROCHANTERIC Left 05/09/2021   Procedure: INTRAMEDULLARY (IM) NAIL INTERTROCHANTRIC;  Surgeon: Cammy Copa, MD;  Location: [facility] OR;  Service: Orthopedics;  Laterality: Left;   IR KYPHO EA ADDL LEVEL THORACIC OR LUMBAR  06/26/2021   IR KYPHO THORACIC WITH BONE BIOPSY  06/26/2021   WRIST SURGERY Left    due to fracture    Allergies  Allergen Reactions   Carbamazepine Other (See Comments)    HAS TO TAKE NAME BRAND ONLY TEGRETOL OR A SEIZURE OCCURS   Other Other (See Comments)    NO FOODS WITH Seeds -- Reflux   Cat Dander Swelling and Other (See Comments)    Patient states that it causes her eyes to  swell shut   Cefaclor Other (See Comments)    Unknown reaction   Codeine Nausea Only   Fish Allergy Nausea Only   Iodinated Contrast Media Other (See Comments)    Unknown reaction     Penicillins Itching   Percocet [Oxycodone-Acetaminophen] Nausea And Vomiting   Red Dye #40 (Allura Red) Other (See Comments)    Reflux   Senna-Docusate Sodium [Sennosides-Docusate Sodium] Other (See Comments)    Severe stomach pains   Shellfish-Derived Products Nausea Only   Shrimp (Diagnostic) Nausea Only    Stomach pain    Tomato Other (See Comments)    Reflux   Hm Lidocaine Patch [Lidocaine] Rash    Says  that this was related to putting a heating pad on the area with the lidocaine patch, has used lidocaine patches in other areas without a reaction    Outpatient Encounter Medications as of 05/22/2023  Medication Sig   acetaminophen (TYLENOL) 500 MG tablet Take 2 tablets (1,000 mg total) by mouth every 8 (eight) hours as needed (for pain).   Artificial Tear Solution (SOOTHE XP) SOLN Place 1 drop into both eyes 3 (three) times daily.   atorvastatin (LIPITOR) 10 MG tablet TAKE 1/2 TABLET DAILY   Calcium Carbonate-Vit D-Min (CALTRATE 600+D PLUS MINERALS) 600-800 MG-UNIT CHEW Chew 1 each by mouth 2 (two) times daily.   clonazePAM (KLONOPIN) 1 MG tablet Take 1.5 tablets (1.5 mg total) by mouth at bedtime.   HYDROcodone-acetaminophen (NORCO/VICODIN) 5-325 MG tablet Take 0.5 tablets by mouth 2 (two) times daily as needed.   Multiple Vitamins-Minerals (PRESERVISION AREDS 2) CAPS Take 1 capsule by mouth in the morning and at bedtime.   Nutritional Supplements (ENSURE MAX PROTEIN PO) Take 1 Bottle by mouth See admin instructions. Drink 1 bottle by mouth once a day, alternating with one bottle of Ensure original formula every other day   Nutritional Supplements (ENSURE ORIGINAL) LIQD Take 1 Bottle by mouth See admin instructions. Drink 1 bottle by mouth every other day- alternating with one bottle of Ensure High Protein formula every other day   polyethylene glycol powder (GLYCOLAX/MIRALAX) 17 GM/SCOOP powder Take 17 g by mouth daily.   TEGRETOL 200 MG tablet TAKE 1 AND 1/2 TABLETS BY MOUTH AT BEDTIME   Vitamin D, Ergocalciferol, 50000 units CAPS TAKE ONE CAPSULE BY MOUTH ONCE A WEEK   lidocaine 4 % Place 1 patch onto the skin daily. (Patient not taking: Reported on 05/22/2023)   Polyethyl Glycol-Propyl Glycol (SYSTANE) 0.4-0.3 % GEL ophthalmic gel Place 1 Application into both eyes at bedtime. (Patient not taking: Reported on 05/22/2023)   No facility-administered encounter medications on file as of 05/22/2023.     Review of Systems  Constitutional:  Negative for chills and fever.  HENT:  Negative for sore throat.   Respiratory:  Negative for cough, shortness of breath and wheezing.   Cardiovascular:  Negative for chest pain, palpitations and leg swelling.  Gastrointestinal:  Negative for abdominal distention, abdominal pain, blood in stool, constipation, diarrhea, nausea and vomiting.  Genitourinary:  Negative for dysuria.  Musculoskeletal:  Positive for back pain.  Neurological:  Negative for dizziness.  Psychiatric/Behavioral:  Positive for sleep disturbance. The patient is nervous/anxious.     Immunization History  Administered Date(s) Administered   Fluad Quad(high Dose 65+) 12/03/2020, 12/17/2022   Fluad Trivalent(High Dose 65+) 12/17/2022   Influenza Whole 11/19/2017   Influenza, High Dose Seasonal PF 11/26/2016, 12/01/2018, 11/30/2019, 11/21/2021   Influenza-Unspecified 11/18/2014, 11/29/2015   Moderna Covid-19 Fall Seasonal Vaccine  56yrs & older 12/03/2022   Moderna Covid-19 Vaccine Bivalent Booster 68yrs & up 07/05/2021   Moderna Sars-Covid-2 Vaccination 02/21/2019, 03/21/2019, 12/27/2019, 06/26/2020   Pfizer Covid-19 Vaccine Bivalent Booster 57yrs & up 11/06/2020   Pneumococcal Conjugate-13 09/24/2017   Pneumococcal Polysaccharide-23 12/30/2012, 09/18/2018   Respiratory Syncytial Virus Vaccine,Recomb Aduvanted(Arexvy) 02/01/2022, 02/06/2022   Tdap 08/20/2017   Zoster Recombinant(Shingrix) 07/21/2017, 12/08/2017   Zoster, Live 05/28/2007   Pertinent  Health Maintenance Due  Topic Date Due   OPHTHALMOLOGY EXAM  05/07/2022   INFLUENZA VACCINE  09/18/2023   HEMOGLOBIN A1C  11/05/2023   FOOT EXAM  11/06/2023   MAMMOGRAM  05/11/2024   DEXA SCAN  Completed      12/11/2022   12:57 PM 01/02/2023   10:47 AM 01/09/2023   10:38 AM 02/05/2023    2:39 PM 04/30/2023    1:47 PM  Fall Risk  Falls in the past year? 0 0 0 0 1  Was there an injury with Fall? 0 0 0 0 1  Fall Risk  Category Calculator 0 0 0 0 3  Patient at Risk for Falls Due to No Fall Risks No Fall Risks No Fall Risks  History of fall(s)  Fall risk Follow up Falls evaluation completed Falls evaluation completed Falls evaluation completed;Education provided;Falls prevention discussed  Falls evaluation completed   Functional Status Survey:    Vitals:   05/22/23 1449  BP: (!) 112/59  Pulse: 88  Resp: 18  Temp: 98.6 F (37 C)  SpO2: 95%  Weight: 101 lb 3.2 oz (45.9 kg)  Height: 5\' 5"  (1.651 m)   Body mass index is 16.84 kg/m. Physical Exam Constitutional:      Appearance: Normal appearance.  HENT:     Head: Normocephalic and atraumatic.  Cardiovascular:     Rate and Rhythm: Normal rate and regular rhythm.  Pulmonary:     Effort: Pulmonary effort is normal. No respiratory distress.     Breath sounds: Normal breath sounds. No wheezing.  Abdominal:     General: Bowel sounds are normal. There is no distension.     Tenderness: There is no abdominal tenderness. There is no guarding or rebound.     Comments:    Musculoskeletal:        General: No swelling.     Comments: No spinal or paraspinal tenderness  Neurological:     Mental Status: She is alert. Mental status is at baseline.     Labs reviewed: Recent Labs    01/13/23 0725 04/23/23 1614 04/24/23 1613  NA 141 140 141  K 3.8 4.1 3.9  CL 103 105 104  CO2 28 25 27   GLUCOSE 110* 108* 118*  BUN 19 26* 28*  CREATININE 0.67 0.55 0.61  CALCIUM 9.4 9.6 9.3   Recent Labs    06/03/22 0718 11/11/22 0725 04/23/23 1614  AST 26 22 29   ALT 20 19 25   ALKPHOS  --   --  39  BILITOT 0.6 0.6 0.5  PROT 6.3 6.3 6.6  ALBUMIN  --   --  4.3   Recent Labs    06/03/22 0718 01/13/23 0725 04/23/23 1614 04/24/23 1613  WBC 4.6 5.4 9.1 8.1  NEUTROABS 2,571  --  7.3  --   HGB 13.3 14.0 13.8 13.2  HCT 40.9 41.8 41.6 41.2  MCV 96.5 99.8 101.5* 102.0*  PLT 227 207 177 172   Lab Results  Component Value Date   TSH 2.49 05/05/2023   Lab  Results  Component  Value Date   HGBA1C 5.9 05/05/2023   Lab Results  Component Value Date   CHOL 171 05/05/2023   HDL 61 05/05/2023   LDLCALC 90 05/05/2023   TRIG 104 05/05/2023   CHOLHDL 3.0 06/03/2022    Significant Diagnostic Results in last 30 days:  CT Lumbar Spine Wo Contrast Result Date: 04/24/2023 CLINICAL DATA:  Fall yesterday and today.  Back pain. EXAM: CT LUMBAR SPINE WITHOUT CONTRAST TECHNIQUE: Multidetector CT imaging of the lumbar spine was performed without intravenous contrast administration. Multiplanar CT image reconstructions were also generated. RADIATION DOSE REDUCTION: This exam was performed according to the departmental dose-optimization program which includes automated exposure control, adjustment of the mA and/or kV according to patient size and/or use of iterative reconstruction technique. COMPARISON:  MRI lumbar spine 06/23/2021. FINDINGS: Segmentation: 5 non rib-bearing lumbar type vertebral bodies are present. The lowest fully formed vertebral body is L5. Alignment: No significant listhesis is present. Lumbar lordosis is preserved. Rightward curvature is centered at L2-3. Vertebrae: Remote spinal augmentation at L1 is noted. Vertebral body heights are otherwise normal. No other acute scratched at no acute fracture is present. Paraspinal and other soft tissues: Atherosclerotic calcifications are present in the aorta without aneurysm. A 4.2 cm simple cyst is present at the upper pole of the right kidney. The visualized abdomen is otherwise within normal limits. No significant adenopathy is present. Disc levels: No significant focal disc protrusion or stenosis is present in lumbar spine. Facet hypertrophy is present bilaterally at L5-S1. IMPRESSION: 1. No acute fracture or traumatic subluxation. 2. Remote spinal augmentation at L1. 3. Rightward curvature of the lumbar spine centered at L2-3. 4. Facet hypertrophy bilaterally at L5-S1. 5. 4.2 cm simple cyst at the upper pole  of the right kidney. Electronically Signed   By: Marin Roberts M.D.   On: 04/24/2023 18:15   CT Thoracic Spine Wo Contrast Result Date: 04/24/2023 CLINICAL DATA:  Fall yesterday. Unwitnessed fall with trauma to head. Dizziness. EXAM: CT THORACIC SPINE WITHOUT CONTRAST TECHNIQUE: Multidetector CT images of the thoracic were obtained using the standard protocol without intravenous contrast. RADIATION DOSE REDUCTION: This exam was performed according to the departmental dose-optimization program which includes automated exposure control, adjustment of the mA and/or kV according to patient size and/or use of iterative reconstruction technique. COMPARISON:  MRI of the thoracic spine 06/23/2021. FINDINGS: Alignment: No significant listhesis is present. Thoracic kyphosis is stable. Mild leftward curvature of the thoracic spine is centered at T10. Vertebrae: Acute superior endplate fracture is present at T6 the vertebral body height is reduced to 10 mm. This compares to 14 mm at T5. No other acute fractures are present. Spinal augmentation is noted at T8 and L1. Paraspinous soft tissues: Minimal soft tissue density adjacent to the T6 vertebral body is likely posttraumatic. Paraspinous soft tissues are otherwise within normal limits. Descending thoracic aortic calcifications are present without aneurysm. The visualized lung fields are clear. No significant pleural effusion or pneumothorax is present. The upper abdomen is within normal limits. Disc levels: A left paramedian disc protrusion partially effaces the left ventral CSF at T9-10. No other significant central disc disease or stenosis is present. Osseous foraminal narrowing is present on the right at T8-9. The foramina are otherwise patent bilaterally. IMPRESSION: 1. Acute superior endplate fracture at T6 with 10 mm of vertebral body height loss. 2. No other acute fractures. 3. Spinal augmentation at T8 and L1. 4. Left paramedian disc protrusion at T9-10  partially effaces the left ventral CSF. 5.  Osseous foraminal narrowing on the right at T8-9. Electronically Signed   By: Marin Roberts M.D.   On: 04/24/2023 18:07   CT Head Wo Contrast Result Date: 04/23/2023 CLINICAL DATA:  Seizure, fall with head trauma. EXAM: CT HEAD WITHOUT CONTRAST CT CERVICAL SPINE WITHOUT CONTRAST TECHNIQUE: Multidetector CT imaging of the head and cervical spine was performed following the standard protocol without intravenous contrast. Multiplanar CT image reconstructions of the cervical spine were also generated. RADIATION DOSE REDUCTION: This exam was performed according to the departmental dose-optimization program which includes automated exposure control, adjustment of the mA and/or kV according to patient size and/or use of iterative reconstruction technique. COMPARISON:  06/21/2021 FINDINGS: CT HEAD FINDINGS Brain: No acute intracranial hemorrhage. No CT evidence of acute infarct. No edema, mass effect, or midline shift. The basilar cisterns are patent. Ventricles: Prominence of the ventricles suggesting underlying parenchymal volume loss. Vascular: No hyperdense vessel or unexpected calcification. Skull: No acute or aggressive finding. Orbits: Orbits are symmetric. Sinuses: The visualized paranasal sinuses are clear. Other: Laceration and mild soft tissue swelling in the right parieto-occipital scalp. Mastoid air cells are clear CT CERVICAL SPINE FINDINGS Alignment: Cervical lordosis is maintained. Trace retrolisthesis of C5 on C6. No facet subluxation or dislocation. Skull base and vertebrae: No compression fracture or displaced fracture in the cervical spine. No suspicious osseous lesion. Soft tissues and spinal canal: No prevertebral fluid or swelling. No visible canal hematoma. 2.0 cm nodule involving the inferior aspect of the right thyroid lobe and isthmus is present since 2011 at which time it measured 1.7 cm. Disc levels: Moderate to severe disc space narrowing at  C5-6. Disc bulges at multiple levels without high-grade spinal canal stenosis. Disc osteophyte complex at C5-6 without high-grade spinal canal stenosis. Facet arthrosis greatest on the left at C4-5. Upper chest: Biapical pleuroparenchymal scarring and pleural calcifications. Other: None. IMPRESSION: 1. No acute intracranial abnormality. 2. Laceration and mild soft tissue swelling in the right parieto-occipital scalp. 3. No acute fracture or traumatic malalignment in the cervical spine. 4. Multilevel degenerative changes of the cervical spine, as described above. 5. 2.0 cm nodule involving the inferior aspect of the right thyroid lobe and isthmus, slightly increased in size from 2011. Consider thyroid ultrasound for further evaluation if not previously performed. Electronically Signed   By: Emily Filbert M.D.   On: 04/23/2023 13:28   CT Cervical Spine Wo Contrast Result Date: 04/23/2023 CLINICAL DATA:  Seizure, fall with head trauma. EXAM: CT HEAD WITHOUT CONTRAST CT CERVICAL SPINE WITHOUT CONTRAST TECHNIQUE: Multidetector CT imaging of the head and cervical spine was performed following the standard protocol without intravenous contrast. Multiplanar CT image reconstructions of the cervical spine were also generated. RADIATION DOSE REDUCTION: This exam was performed according to the departmental dose-optimization program which includes automated exposure control, adjustment of the mA and/or kV according to patient size and/or use of iterative reconstruction technique. COMPARISON:  06/21/2021 FINDINGS: CT HEAD FINDINGS Brain: No acute intracranial hemorrhage. No CT evidence of acute infarct. No edema, mass effect, or midline shift. The basilar cisterns are patent. Ventricles: Prominence of the ventricles suggesting underlying parenchymal volume loss. Vascular: No hyperdense vessel or unexpected calcification. Skull: No acute or aggressive finding. Orbits: Orbits are symmetric. Sinuses: The visualized paranasal  sinuses are clear. Other: Laceration and mild soft tissue swelling in the right parieto-occipital scalp. Mastoid air cells are clear CT CERVICAL SPINE FINDINGS Alignment: Cervical lordosis is maintained. Trace retrolisthesis of C5 on C6. No facet subluxation or dislocation. Skull  base and vertebrae: No compression fracture or displaced fracture in the cervical spine. No suspicious osseous lesion. Soft tissues and spinal canal: No prevertebral fluid or swelling. No visible canal hematoma. 2.0 cm nodule involving the inferior aspect of the right thyroid lobe and isthmus is present since 2011 at which time it measured 1.7 cm. Disc levels: Moderate to severe disc space narrowing at C5-6. Disc bulges at multiple levels without high-grade spinal canal stenosis. Disc osteophyte complex at C5-6 without high-grade spinal canal stenosis. Facet arthrosis greatest on the left at C4-5. Upper chest: Biapical pleuroparenchymal scarring and pleural calcifications. Other: None. IMPRESSION: 1. No acute intracranial abnormality. 2. Laceration and mild soft tissue swelling in the right parieto-occipital scalp. 3. No acute fracture or traumatic malalignment in the cervical spine. 4. Multilevel degenerative changes of the cervical spine, as described above. 5. 2.0 cm nodule involving the inferior aspect of the right thyroid lobe and isthmus, slightly increased in size from 2011. Consider thyroid ultrasound for further evaluation if not previously performed. Electronically Signed   By: Emily Filbert M.D.   On: 04/23/2023 13:28    Assessment/Plan  Compression fracture   Chronic back pain due to compression fracture   - Schedule physical therapy for back exercises.   - Administer Tylenol 1000 mg three times daily.   Informed patient to take norco for breakthrough pain  - Prescribe Norco for breakthrough pain, up to three times daily.    Anxiety    Clonazepam poses fall risk and dependency concerns.  Effexor recommended as a  safer alternative. She has been refusing Effexor.   - Encourage daily use of Effexor for anxiety management.      Insomnia    . Effexor recommended to improve sleep indirectly by managing anxiety.   - Encourage adherence to Effexor to improve sleep indirectly by managing anxiety.   - Discuss sleep hygiene and potential non-pharmacological interventions.     30 min Total time spent for obtaining history,  performing a medically appropriate examination and evaluation, reviewing the tests,  documenting clinical information in the electronic or other health record, I  ,care coordination (not separately reported)

## 2023-05-25 ENCOUNTER — Encounter: Payer: Self-pay | Admitting: Sports Medicine

## 2023-05-25 DIAGNOSIS — R41841 Cognitive communication deficit: Secondary | ICD-10-CM | POA: Diagnosis not present

## 2023-05-27 ENCOUNTER — Non-Acute Institutional Stay: Payer: Self-pay | Admitting: Nurse Practitioner

## 2023-05-27 ENCOUNTER — Encounter: Payer: Self-pay | Admitting: Nurse Practitioner

## 2023-05-27 DIAGNOSIS — K5904 Chronic idiopathic constipation: Secondary | ICD-10-CM

## 2023-05-27 DIAGNOSIS — E785 Hyperlipidemia, unspecified: Secondary | ICD-10-CM | POA: Diagnosis not present

## 2023-05-27 DIAGNOSIS — R7303 Prediabetes: Secondary | ICD-10-CM | POA: Diagnosis not present

## 2023-05-27 DIAGNOSIS — M810 Age-related osteoporosis without current pathological fracture: Secondary | ICD-10-CM | POA: Diagnosis not present

## 2023-05-27 DIAGNOSIS — G40822 Epileptic spasms, not intractable, without status epilepticus: Secondary | ICD-10-CM | POA: Diagnosis not present

## 2023-05-27 DIAGNOSIS — F5101 Primary insomnia: Secondary | ICD-10-CM

## 2023-05-27 DIAGNOSIS — S22000S Wedge compression fracture of unspecified thoracic vertebra, sequela: Secondary | ICD-10-CM | POA: Diagnosis not present

## 2023-05-27 NOTE — Assessment & Plan Note (Signed)
takes Reclast, t-score -3.6 09/13/19, -3.5 05/12/22

## 2023-05-27 NOTE — Progress Notes (Signed)
 Location:   [facility] Room Number: 812 Place of Service:  ALF (13) Provider: Arna Snipe Dalyn Becker NP  Jacynda Brunke X, NP  Patient Care Team: Antonius Hartlage X, NP as PCP - General (Internal Medicine) Cylah Fannin X, NP as Nurse Practitioner (Internal Medicine)  Extended Emergency Contact Information Primary Emergency Contact: Charlies Con[place] Mobile Phone: [phone] Relation: Friend  Code Status: DNR Goals of care: Advanced Directive information    05/22/2023    3:03 PM  Advanced Directives  Does Patient Have a Medical Advance Directive? Yes  Type of Estate agent of [place];Living will;Out of facility DNR (pink MOST or yellow form)  Does patient want to make changes to medical advance directive? No - Patient declined  Copy of Healthcare Power of Attorney in Chart? Yes - validated most recent copy scanned in chart (See row information)  Pre-existing out of facility DNR order (yellow form or pink MOST form) Yellow form placed in chart (order not valid for inpatient use)     Chief Complaint  Patient presents with  . Acute Visit    Persisted mid back pain    HPI:  Pt is a 83 y.o. female seen today for an acute visit for persisted mid back pain.   ED 04/24/23 T6 fx on CT T spine, L spine. Norco, Tylenol for pain.  ED 04/23/23 for dizziness, fall, head injury/scal laceration, etiology was deemed to active seizure, followed by Neurology, CT head/cervical spine, EKG, labs unremarkable.              OA, s/p left hip surgery, s/p Kyphplasty             Dizziness: recurrent.               Post op anemia, Hgb 13.2 04/24/23             Seizures, no active seizures since last seen, failed generic Tegretol in the past, on Tegretol 200mg  qd, Clonazepam hs (GDR to stop on her own didn't cause active seizures, but flare up insomnia and anxiety). S/p Neurology              Constipation, stable, on MiraLax, Senokot S II bid.              Hyperlipidemia, takes  Atorvastatin 5mg  qd, LDL 90 05/05/23             Prediabetic, Hgb a1c 5.7 04/25/21<<6.2 06/03/22>>5.9 3/18/2, lifestyle modification ACR 5 11/11/22             OP takes Reclast, t-score -3.6 09/13/19, -3.5 05/12/22             Insomnia/anxiety, takes Clonazepam, failed GDR,  failed Ambien, declined Effexor. TSH 2.49 05/05/23             Erosive gastropathy, off PPI         Past Medical History:  Diagnosis Date  . Anxiety   . Cataract   . Chronic constipation   . Depression   . Osteoporosis   . Seizures ([name])    epilepsy  last seizure 1977   Past Surgical History:  Procedure Laterality Date  . ABDOMINAL HYSTERECTOMY  1995   Dr. Carlis Abbott  . CATARACT EXTRACTION Bilateral 12/2016  . COLONOSCOPY  2012   patchy increased intraepithelial lymphocytes - draelos  . ESOPHAGOGASTRODUODENOSCOPY     multiple  . FISSURECTOMY    . INTRAMEDULLARY (IM) NAIL INTERTROCHANTERIC Left 05/09/2021   Procedure:  INTRAMEDULLARY (IM) NAIL INTERTROCHANTRIC;  Surgeon: Cammy Copa, MD;  Location: [facility] OR;  Service: Orthopedics;  Laterality: Left;  . IR KYPHO EA ADDL LEVEL THORACIC OR LUMBAR  06/26/2021  . IR KYPHO THORACIC WITH BONE BIOPSY  06/26/2021  . WRIST SURGERY Left    due to fracture    Allergies  Allergen Reactions  . Carbamazepine Other (See Comments)    HAS TO TAKE NAME BRAND ONLY TEGRETOL OR A SEIZURE OCCURS  . Other Other (See Comments)    NO FOODS WITH Seeds -- Reflux  . Cat Dander Swelling and Other (See Comments)    Patient states that it causes her eyes to swell shut  . Cefaclor Other (See Comments)    Unknown reaction  . Codeine Nausea Only  . Fish Allergy Nausea Only  . Iodinated Contrast Media Other (See Comments)    Unknown reaction    . Penicillins Itching  . Percocet [Oxycodone-Acetaminophen] Nausea And Vomiting  . Red Dye #40 (Allura Red) Other (See Comments)    Reflux  . Senna-Docusate Sodium [Sennosides-Docusate Sodium] Other (See Comments)    Severe stomach pains   . Shellfish-Derived Products Nausea Only  . Shrimp (Diagnostic) Nausea Only    Stomach pain   . Tomato Other (See Comments)    Reflux  . Hm Lidocaine Patch [Lidocaine] Rash    Says that this was related to putting a heating pad on the area with the lidocaine patch, has used lidocaine patches in other areas without a reaction    Allergies as of 05/27/2023       Reactions   Carbamazepine Other (See Comments)   HAS TO TAKE NAME BRAND ONLY TEGRETOL OR A SEIZURE OCCURS   Other Other (See Comments)   NO FOODS WITH Seeds -- Reflux   Cat Dander Swelling, Other (See Comments)   Patient states that it causes her eyes to swell shut   Cefaclor Other (See Comments)   Unknown reaction   Codeine Nausea Only   Fish Allergy Nausea Only   Iodinated Contrast Media Other (See Comments)   Unknown reaction   Penicillins Itching   Percocet [oxycodone-acetaminophen] Nausea And Vomiting   Red Dye #40 (allura Red) Other (See Comments)   Reflux   Senna-docusate Sodium [sennosides-docusate Sodium] Other (See Comments)   Severe stomach pains   Shellfish-derived Products Nausea Only   Shrimp (diagnostic) Nausea Only   Stomach pain   Tomato Other (See Comments)   Reflux   Hm Lidocaine Patch [lidocaine] Rash   Says that this was related to putting a heating pad on the area with the lidocaine patch, has used lidocaine patches in other areas without a reaction        Medication List        Accurate as of May 27, 2023  2:28 PM. If you have any questions, ask your nurse or doctor.          acetaminophen 500 MG tablet Commonly known as: TYLENOL Take 2 tablets (1,000 mg total) by mouth every 8 (eight) hours as needed (for pain).   atorvastatin 10 MG tablet Commonly known as: LIPITOR TAKE 1/2 TABLET DAILY   Caltrate 600+D Plus Minerals 600-800 MG-UNIT Chew Chew 1 each by mouth 2 (two) times daily.   clonazePAM 1 MG tablet Commonly known as: KLONOPIN Take 1.5 tablets (1.5 mg total) by mouth  at bedtime.   ENSURE MAX PROTEIN PO Take 1 Bottle by mouth See admin instructions. Drink 1 bottle by mouth once  a day, alternating with one bottle of Ensure original formula every other day   Ensure Original Liqd Take 1 Bottle by mouth See admin instructions. Drink 1 bottle by mouth every other day- alternating with one bottle of Ensure High Protein formula every other day   HYDROcodone-acetaminophen 5-325 MG tablet Commonly known as: NORCO/VICODIN Take 0.5 tablets by mouth 2 (two) times daily as needed.   lidocaine 4 % Place 1 patch onto the skin daily.   polyethylene glycol powder 17 GM/SCOOP powder Commonly known as: GLYCOLAX/MIRALAX Take 17 g by mouth daily.   PreserVision AREDS 2 Caps Take 1 capsule by mouth in the morning and at bedtime.   Soothe XP Soln Place 1 drop into both eyes 3 (three) times daily.   Systane 0.4-0.3 % Gel ophthalmic gel Generic drug: Polyethyl Glycol-Propyl Glycol Place 1 Application into both eyes at bedtime.   TEGretol 200 MG tablet Generic drug: carbamazepine TAKE 1 AND 1/2 TABLETS BY MOUTH AT BEDTIME   Vitamin D (Ergocalciferol) 50000 units Caps TAKE ONE CAPSULE BY MOUTH ONCE A WEEK        Review of Systems  Constitutional:  Negative for appetite change, fatigue and fever.  HENT:  Positive for hearing loss. Negative for congestion and voice change.   Eyes:  Negative for visual disturbance.  Respiratory:  Negative for shortness of breath.   Cardiovascular:  Negative for leg swelling.  Gastrointestinal:  Negative for abdominal pain and constipation.  Genitourinary:  Positive for frequency. Negative for dysuria and urgency.       The patient stated she pushes her suprapubic region to help emptying her bladder  Musculoskeletal:  Positive for arthralgias, back pain and gait problem.       Left hip pain when walking, better.  Mid back pain  Skin:  Positive for wound.       Left hip surgical incision is healed.   Neurological:  Positive  for numbness. Negative for seizures and weakness.       Tingling/numbness left foot sometimes. Occasionally left hip/leg pain sometimes. Lightheaded when not sleeping well in lifetime.   Psychiatric/Behavioral:  Negative for behavioral problems and sleep disturbance. The patient is not nervous/anxious.        Feels lightheaded if not sleep well at night.    Immunization History  Administered Date(s) Administered  . Fluad Quad(high Dose 65+) 12/03/2020, 12/17/2022  . Fluad Trivalent(High Dose 65+) 12/17/2022  . Influenza Whole 11/19/2017  . Influenza, High Dose Seasonal PF 11/26/2016, 12/01/2018, 11/30/2019, 11/21/2021  . Influenza-Unspecified 11/18/2014, 11/29/2015  . Moderna Covid-19 Fall Seasonal Vaccine 74yrs & older 12/03/2022  . Moderna Covid-19 Vaccine Bivalent Booster 43yrs & up 07/05/2021  . Moderna Sars-Covid-2 Vaccination 02/21/2019, 03/21/2019, 12/27/2019, 06/26/2020  . [employer] officer, [employer] 47yrs & up 11/06/2020  . Pneumococcal Conjugate-13 09/24/2017  . Pneumococcal Polysaccharide-23 12/30/2012, 09/18/2018  . Respiratory Syncytial Virus Vaccine,Recomb Aduvanted(Arexvy) 02/01/2022, 02/06/2022  . Tdap 08/20/2017  . Zoster Recombinant(Shingrix) 07/21/2017, 12/08/2017  . Zoster, Live 05/28/2007   Pertinent  Health Maintenance Due  Topic Date Due  . OPHTHALMOLOGY EXAM  05/07/2022  . INFLUENZA VACCINE  09/18/2023  . HEMOGLOBIN A1C  11/05/2023  . FOOT EXAM  11/06/2023  . MAMMOGRAM  05/11/2024  . DEXA SCAN  Completed      12/11/2022   12:57 PM 01/02/2023   10:47 AM 01/09/2023   10:38 AM 02/05/2023    2:39 PM 04/30/2023    1:47 PM  Fall Risk  Falls in the past year?  0 0 0 0 1  Was there an injury with Fall? 0 0 0 0 1  Fall Risk Category Calculator 0 0 0 0 3  Patient at Risk for Falls Due to No Fall Risks No Fall Risks No Fall Risks  History of fall(s)  Fall risk Follow up Falls evaluation completed Falls evaluation completed Falls evaluation  completed;Education provided;Falls prevention discussed  Falls evaluation completed   Functional Status Survey:    Vitals:   05/27/23 1323  BP: 120/61  Pulse: 80  Resp: 18  Temp: 98.6 F (37 C)  SpO2: 95%  Weight: 103 lb (46.7 kg)   Body mass index is 17.14 kg/m. Physical Exam Vitals and nursing note reviewed.  Constitutional:      Appearance: Normal appearance.  HENT:     Head: Normocephalic and atraumatic.     Mouth/Throat:     Mouth: Mucous membranes are moist.  Eyes:     Extraocular Movements: Extraocular movements intact.     Conjunctiva/sclera: Conjunctivae normal.     Pupils: Pupils are equal, round, and reactive to light.  Cardiovascular:     Rate and Rhythm: Normal rate and regular rhythm.     Heart sounds: No murmur heard. Pulmonary:     Effort: Pulmonary effort is normal.     Breath sounds: No rales.  Abdominal:     General: Bowel sounds are normal.     Palpations: Abdomen is soft.     Tenderness: There is no abdominal tenderness.  Musculoskeletal:        General: Tenderness present.     Cervical back: Normal range of motion and neck supple.     Right lower leg: No edema.     Left lower leg: No edema.     Comments: S/p left hip ORIF.  Mid back tenderness  Skin:    General: Skin is warm and dry.     Comments: Left hip surgical scar Occipital scalp laceration, healing. Staple x7 removal   Neurological:     General: No focal deficit present.     Mental Status: She is alert and oriented to person, place, and time. Mental status is at baseline.     Gait: Gait abnormal.  Psychiatric:        Mood and Affect: Mood normal.        Behavior: Behavior normal.        Thought Content: Thought content normal.        Judgment: Judgment normal.     Comments: Stated she feels sad during holidays, but no change of her participation in playing cards, dinning in dinning room with others, taking [facility] bus for shopping, singing programs, etc.     Labs reviewed: Recent  Labs    01/13/23 0725 04/23/23 1614 04/24/23 1613  NA 141 140 141  K 3.8 4.1 3.9  CL 103 105 104  CO2 28 25 27   GLUCOSE 110* 108* 118*  BUN 19 26* 28*  CREATININE 0.67 0.55 0.61  CALCIUM 9.4 9.6 9.3   Recent Labs    06/03/22 0718 11/11/22 0725 04/23/23 1614  AST 26 22 29   ALT 20 19 25   ALKPHOS  --   --  39  BILITOT 0.6 0.6 0.5  PROT 6.3 6.3 6.6  ALBUMIN  --   --  4.3   Recent Labs    06/03/22 0718 01/13/23 0725 04/23/23 1614 04/24/23 1613  WBC 4.6 5.4 9.1 8.1  NEUTROABS 2,571  --  7.3  --  HGB 13.3 14.0 13.8 13.2  HCT 40.9 41.8 41.6 41.2  MCV 96.5 99.8 101.5* 102.0*  PLT 227 207 177 172   Lab Results  Component Value Date   TSH 2.49 05/05/2023   Lab Results  Component Value Date   HGBA1C 5.9 05/05/2023   Lab Results  Component Value Date   CHOL 171 05/05/2023   HDL 61 05/05/2023   LDLCALC 90 05/05/2023   TRIG 104 05/05/2023   CHOLHDL 3.0 06/03/2022    Significant Diagnostic Results in last 30 days:  No results found.  Assessment/Plan: Thoracic compression fracture ([name]) T6 compression 04/24/23, persisted mid back pain, will increase Norco 5/325mg  1/2 tab q8hr prn for moderate pain, I tab q8hr prn for severe pain.   Epilepsy without status epilepticus, not intractable ([name]) no active seizures since last seen, failed generic Tegretol in the past, on Tegretol 200mg  qd, Clonazepam hs (GDR to stop on her own didn't cause active seizures, but flare up insomnia and anxiety). S/p Neurology   Constipation stable, on MiraLax, Senokot S II bid.   Hyperlipidemia  takes Atorvastatin 5mg  qd, LDL 90 05/05/23  Pre-diabetes Hgb a1c 5.7 04/25/21<<6.2 06/03/22>>5.9 3/18/2, lifestyle modification ACR 5 11/11/22  Osteoporosis  takes Reclast, t-score -3.6 09/13/19, -3.5 05/12/22    Family/ staff Communication: plan of care reviewed with the patient and charge nurse.   Labs/tests ordered:  none

## 2023-05-27 NOTE — Assessment & Plan Note (Signed)
 Hgb a1c 5.7 04/25/21<<6.2 06/03/22>>5.9 3/18/2, lifestyle modification ACR 5 11/11/22

## 2023-05-27 NOTE — Assessment & Plan Note (Signed)
 takes Atorvastatin 5mg  qd, LDL 90 05/05/23

## 2023-05-27 NOTE — Assessment & Plan Note (Signed)
stable, on MiraLax, Senokot S II bid.

## 2023-05-27 NOTE — Assessment & Plan Note (Signed)
 takes Clonazepam, failed GDR,  failed Ambien, declined Effexor. TSH 2.49 05/05/23

## 2023-05-27 NOTE — Assessment & Plan Note (Signed)
 T6 compression 04/24/23, persisted mid back pain, will increase Norco 5/325mg  1/2 tab q8hr prn for moderate pain, I tab q8hr prn for severe pain.

## 2023-05-27 NOTE — Assessment & Plan Note (Signed)
no active seizures since last seen, failed generic Tegretol in the past, on Tegretol 200mg  qd, Clonazepam hs (GDR to stop on her own didn't cause active seizures, but flare up insomnia and anxiety). S/p Neurology

## 2023-05-28 ENCOUNTER — Encounter: Payer: Medicare Other | Admitting: Nurse Practitioner

## 2023-05-28 DIAGNOSIS — R41841 Cognitive communication deficit: Secondary | ICD-10-CM | POA: Diagnosis not present

## 2023-06-01 NOTE — Progress Notes (Signed)
 This encounter was created in error - please disregard.

## 2023-06-03 DIAGNOSIS — R41841 Cognitive communication deficit: Secondary | ICD-10-CM | POA: Diagnosis not present

## 2023-06-04 ENCOUNTER — Other Ambulatory Visit: Payer: Self-pay | Admitting: Nurse Practitioner

## 2023-06-04 DIAGNOSIS — S22000S Wedge compression fracture of unspecified thoracic vertebra, sequela: Secondary | ICD-10-CM

## 2023-06-05 DIAGNOSIS — R41841 Cognitive communication deficit: Secondary | ICD-10-CM | POA: Diagnosis not present

## 2023-06-08 DIAGNOSIS — R41841 Cognitive communication deficit: Secondary | ICD-10-CM | POA: Diagnosis not present

## 2023-06-10 DIAGNOSIS — R41841 Cognitive communication deficit: Secondary | ICD-10-CM | POA: Diagnosis not present

## 2023-06-15 ENCOUNTER — Non-Acute Institutional Stay: Payer: Self-pay | Admitting: Nurse Practitioner

## 2023-06-15 ENCOUNTER — Encounter: Payer: Self-pay | Admitting: Nurse Practitioner

## 2023-06-15 DIAGNOSIS — Z0000 Encounter for general adult medical examination without abnormal findings: Secondary | ICD-10-CM | POA: Diagnosis not present

## 2023-06-15 DIAGNOSIS — M810 Age-related osteoporosis without current pathological fracture: Secondary | ICD-10-CM | POA: Diagnosis not present

## 2023-06-15 NOTE — Progress Notes (Unsigned)
 Subjective:   Erin Ochoa is a 83 y.o. female who presents for Medicare Annual (Subsequent) preventive examination.  Visit Complete: In person  Patient Medicare AWV questionnaire was completed by the patient on 06/15/23; I have confirmed that all information answered by patient is correct and no changes since this date.  Cardiac Risk Factors include: advanced age (>5men, >11 women);dyslipidemia     Objective:    Today's Vitals   06/15/23 1437  BP: 133/61  Pulse: 72  Resp: 18  Temp: 97.8 F (36.6 C)  SpO2: 98%  Weight: 103 lb (46.7 kg)  Height: 5\' 5"  (1.651 m)  PainSc: 8    Body mass index is 17.14 kg/m.     06/15/2023    3:25 PM 05/22/2023    3:03 PM 04/24/2023   12:27 PM 04/23/2023   12:13 PM 02/05/2023    2:40 PM 01/09/2023   10:38 AM 01/02/2023    9:54 AM  Advanced Directives  Does Patient Have a Medical Advance Directive? Yes Yes No No Yes Yes Yes  Type of Estate agent of [place];Living will;Out of facility DNR (pink MOST or yellow form) Healthcare Power of [place];Living will;Out of facility DNR (pink MOST or yellow form)   Healthcare Power of [place];Living will Healthcare Power of [place];Living will Healthcare Power of [place];Living will  Does patient want to make changes to medical advance directive? No - Patient declined No - Patient declined   No - Patient declined No - Patient declined No - Patient declined  Copy of Healthcare Power of Attorney in Chart? Yes - validated most recent copy scanned in chart (See row information) Yes - validated most recent copy scanned in chart (See row information)   Yes - validated most recent copy scanned in chart (See row information) Yes - validated most recent copy scanned in chart (See row information) Yes - validated most recent copy scanned in chart (See row information)  Pre-existing out of facility DNR order (yellow form or pink MOST form) Yellow form placed in chart (order not valid for inpatient  use) Yellow form placed in chart (order not valid for inpatient use)         Current Medications (verified) Outpatient Encounter Medications as of 06/15/2023  Medication Sig   acetaminophen  (TYLENOL ) 500 MG tablet Take 2 tablets (1,000 mg total) by mouth every 8 (eight) hours as needed (for pain).   Artificial Tear Solution (SOOTHE XP) SOLN Place 1 drop into both eyes 3 (three) times daily.   atorvastatin  (LIPITOR) 10 MG tablet TAKE 1/2 TABLET DAILY   Calcium  Carbonate-Vit D-Min (CALTRATE 600+D PLUS MINERALS) 600-800 MG-UNIT CHEW Chew 1 each by mouth 2 (two) times daily.   clonazePAM  (KLONOPIN ) 1 MG tablet Take 1.5 tablets (1.5 mg total) by mouth at bedtime.   HYDROcodone -acetaminophen  (NORCO/VICODIN) 5-325 MG tablet Take 0.5 tablets by mouth 2 (two) times daily as needed.   Multiple Vitamins-Minerals (PRESERVISION AREDS 2) CAPS Take 1 capsule by mouth in the morning and at bedtime.   Nutritional Supplements (ENSURE MAX PROTEIN PO) Take 1 Bottle by mouth See admin instructions. Drink 1 bottle by mouth once a day, alternating with one bottle of Ensure original formula every other day   polyethylene glycol powder (GLYCOLAX /MIRALAX ) 17 GM/SCOOP powder Take 17 g by mouth daily.   TEGRETOL  200 MG tablet TAKE 1 AND 1/2 TABLETS BY MOUTH AT BEDTIME   Vitamin D , Ergocalciferol , 50000 units CAPS TAKE ONE CAPSULE BY MOUTH ONCE A WEEK   lidocaine  4 %  Place 1 patch onto the skin daily. (Patient not taking: Reported on 06/15/2023)   Nutritional Supplements (ENSURE ORIGINAL) LIQD Take 1 Bottle by mouth See admin instructions. Drink 1 bottle by mouth every other day- alternating with one bottle of Ensure High Protein formula every other day (Patient not taking: Reported on 06/15/2023)   Polyethyl Glycol-Propyl Glycol (SYSTANE) 0.4-0.3 % GEL ophthalmic gel Place 1 Application into both eyes at bedtime. (Patient not taking: Reported on 06/15/2023)   No facility-administered encounter medications on file as of  06/15/2023.    Allergies (verified) Carbamazepine , Other, Cat dander, Cefaclor, Codeine, Fish allergy, Iodinated contrast media, Penicillins, Percocet [oxycodone -acetaminophen ], Red dye #40 (allura red), Senna-docusate sodium  [sennosides-docusate sodium ], Shellfish-derived products, Shrimp (diagnostic), Tomato, and Hm lidocaine  patch [lidocaine ]   History: Past Medical History:  Diagnosis Date   Anxiety    Cataract    Chronic constipation    Depression    Osteoporosis    Seizures ([name])    epilepsy  last seizure 1977   Past Surgical History:  Procedure Laterality Date   ABDOMINAL HYSTERECTOMY  1995   Dr. Amado Bacon   CATARACT EXTRACTION Bilateral 12/2016   COLONOSCOPY  2012   patchy increased intraepithelial lymphocytes - draelos   ESOPHAGOGASTRODUODENOSCOPY     multiple   FISSURECTOMY     INTRAMEDULLARY (IM) NAIL INTERTROCHANTERIC Left 05/09/2021   Procedure: INTRAMEDULLARY (IM) NAIL INTERTROCHANTRIC;  Surgeon: Jasmine Mesi, MD;  Location: [facility] OR;  Service: Orthopedics;  Laterality: Left;   IR KYPHO EA ADDL LEVEL THORACIC OR LUMBAR  06/26/2021   IR KYPHO THORACIC WITH BONE BIOPSY  06/26/2021   WRIST SURGERY Left    due to fracture   Family History  Problem Relation Age of Onset   Alcohol  abuse Father    Diabetes Father    Diabetes Maternal Grandmother    Diabetes Paternal Grandmother    Colon cancer Maternal Grandfather    Esophageal cancer Neg Hx    Rectal cancer Neg Hx    Stomach cancer Neg Hx    Social History   Socioeconomic History   Marital status: Widowed    Spouse name: Not on file   Number of children: 0   Years of education: Not on file   Highest education level: Some college, no degree  Occupational History   Occupation: Retired  Tobacco Use   Smoking status: Former    Current packs/day: 0.00    Average packs/day: 1 pack/day for 25.0 years (25.0 ttl pk-yrs)    Types: Cigarettes    Start date: 02/18/1956    Quit date: 02/17/1981    Years since  quitting: 42.3   Smokeless tobacco: Never  Vaping Use   Vaping status: Never Used  Substance and Sexual Activity   Alcohol  use: No    Alcohol /week: 0.0 standard drinks of alcohol    Drug use: No   Sexual activity: Not Currently  Other Topics Concern   Not on file  Social History Narrative   Social History     Marital status: Widowed           Spouse name:                        Years of education:  1 year college              Number of children:0           Widowed no children      Occupational History: Neurosurgeon  practices, Adm. Therapist, music care, [employer]     None on file      Social History Main Topics     Smoking status: Former Smoker                                             Packs/day: 1.00      Years: 0.00            Types: Cigarettes        Smokeless tobacco: Not on file                        Alcohol  use: No               Drug use: No               Sexual activity: Not on file            Does not drink caffeine, but does eat chocolate.   Does live in an apartment ([address]) retirement community does exercise : walks daily   Right handed         Social Drivers of Corporate investment banker Strain: Low Risk  (03/13/2017)   Overall Financial Resource Strain (CARDIA)    Difficulty of Paying Living Expenses: Not hard at all  Food Insecurity: No Food Insecurity (03/13/2017)   Hunger Vital Sign    Worried About Running Out of Food in the Last Year: Never true    Ran Out of Food in the Last Year: Never true  Transportation Needs: No Transportation Needs (03/13/2017)   PRAPARE - Administrator, Civil Service (Medical): No    Lack of Transportation (Non-Medical): No  Physical Activity: Sufficiently Active (03/13/2017)   Exercise Vital Sign    Days of Exercise per Week: 5 days    Minutes of Exercise per Session: 30 min  Stress: No Stress Concern Present (03/13/2017)   [employer] of Occupational Health - Occupational Stress  Questionnaire    Feeling of Stress : Only a little  Social Connections: Somewhat Isolated (03/13/2017)   Social Connection and Isolation Panel [NHANES]    Frequency of Communication with Friends and Family: More than three times a week    Frequency of Social Gatherings with Friends and Family: More than three times a week    Attends Religious Services: More than 4 times per year    Active Member of [employer] or Organizations: No    Attends Banker Meetings: Never    Marital Status: Widowed    Tobacco Counseling Counseling given: Not Answered   Clinical Intake:  Pre-visit preparation completed: Yes  Pain : 0-10 Pain Score: 8  Pain Type: Acute pain Pain Location: Back Pain Orientation: Mid Pain Radiating Towards: none Pain Descriptors / Indicators: Aching Pain Onset: More than a month ago Pain Frequency: Constant Pain Relieving Factors: rest, Norco Effect of Pain on Daily Activities: less walking  Pain Relieving Factors: rest, Norco  BMI - recorded: 17.14 Nutritional Status: BMI <19  Underweight Nutritional Risks: None Diabetes: No  How often do you need to have someone help you when you read instructions, pamphlets, or other written materials from your doctor or pharmacy?: 2 - Rarely What is the last grade level you completed in school?: college  Interpreter Needed?: No  Information entered by ::  Kaseem Vastine Daylene Evangelist NP   Activities of Daily Living    06/15/2023    2:40 PM 11/06/2022    3:48 PM  In your present state of health, do you have any difficulty performing the following activities:  Hearing? 0 0  Vision? 0 0  Difficulty concentrating or making decisions? 0 0  Walking or climbing stairs? 1 0  Dressing or bathing? 1 0  Doing errands, shopping? 1 0  Preparing Food and eating ? N N  Using the Toilet? N N  In the past six months, have you accidently leaked urine? Y   Do you have problems with loss of bowel control? N N  Managing your Medications? Y N   Managing your Finances? Y N  Housekeeping or managing your Housekeeping? Y N    Patient Care Team: Grasiela Jonsson X, NP as PCP - General (Internal Medicine) Hailee Hollick X, NP as Nurse Practitioner (Internal Medicine)  Indicate any recent Medical Services you may have received from other than [facility] providers in the past year (date may be approximate).     Assessment:   This is a routine wellness examination for Denisa.  Hearing/Vision screen No results found.   Goals Addressed             This Visit's Progress    Maintain Mobility and Function       Evidence-based guidance:  Acknowledge and validate impact of pain, loss of strength and potential disfigurement (hand osteoarthritis) on mental health and daily life, such as social isolation, anxiety, depression, impaired sexual relationship and   injury from falls.  Anticipate referral to physical or occupational therapy for assessment, therapeutic exercise and recommendation for adaptive equipment or assistive devices; encourage participation.  Assess impact on ability to perform activities of daily living, as well as engage in sports and leisure events or requirements of work or school.  Provide anticipatory guidance and reassurance about the benefit of exercise to maintain function; acknowledge and normalize fear that exercise may worsen symptoms.  Encourage regular exercise, at least 10 minutes at a time for 45 minutes per week; consider yoga, water  exercise and proprioceptive exercises; encourage use of wearable activity tracker to increase motivation and adherence.  Encourage maintenance or resumption of daily activities, including employment, as pain allows and with minimal exposure to trauma.  Assist patient to advocate for adaptations to the work environment.  Consider level of pain and function, gender, age, lifestyle, patient preference, quality of life, readiness and ?ocapacity to benefit? when recommending patients for  orthopaedic surgery consultation.  Explore strategies, such as changes to medication regimen or activity that enables patient to anticipate and manage flare-ups that increase deconditioning and disability.  Explore patient preferences; encourage exposure to a broader range of activities that have been avoided for fear of experiencing pain.  Identify barriers to participation in therapy or exercise, such as pain with activity, anticipated or imagined pain.  Monitor postoperative joint replacement or any preexisting joint replacement for ongoing pain and loss of function; provide social support and encouragement throughout recovery.   Notes:        Depression Screen    06/15/2023    2:47 PM 02/05/2023    4:15 PM 02/05/2023    2:39 PM 01/09/2023   11:07 AM 01/02/2023   10:47 AM 12/11/2022   12:58 PM 11/06/2022    3:41 PM  PHQ 2/9 Scores  PHQ - 2 Score 0 0 0 4 0 0 0  PHQ- 9 Score  0  10       Fall Risk    04/30/2023    1:47 PM 02/05/2023    2:39 PM 01/09/2023   10:38 AM 01/02/2023   10:47 AM 12/11/2022   12:57 PM  Fall Risk   Falls in the past year? 1 0 0 0 0  Number falls in past yr: 1 0 0 0 0  Injury with Fall? 1 0 0 0 0  Risk for fall due to : History of fall(s)  No Fall Risks No Fall Risks No Fall Risks  Follow up Falls evaluation completed  Falls evaluation completed;Education provided;Falls prevention discussed Falls evaluation completed Falls evaluation completed    MEDICARE RISK AT HOME: Medicare Risk at Home Any stairs in or around the home?: Yes If so, are there any without handrails?: No Home free of loose throw rugs in walkways, pet beds, electrical cords, etc?: Yes Adequate lighting in your home to reduce risk of falls?: Yes Life alert?: No Use of a cane, walker or w/c?: Yes Grab bars in the bathroom?: Yes Shower chair or bench in shower?: Yes Elevated toilet seat or a handicapped toilet?: Yes  TIMED UP AND GO:  Was the test performed?  Yes  Length of time  to ambulate 10 feet: 10 sec Gait slow and steady with assistive device    Cognitive Function:    11/06/2022    3:26 PM  MMSE - Mini Mental State Exam  Orientation to time 5  Orientation to Place 5  Registration 3  Attention/ Calculation 5  Recall 2  Language- name 2 objects 2  Language- repeat 1  Language- follow 3 step command 3  Language- read & follow direction 1  Write a sentence 1  Copy design 1  Total score 29        10/29/2021   11:16 AM 10/18/2020    1:16 PM  6CIT Screen  What Year? 0 points 0 points  What month? 0 points 0 points  What time? 0 points 0 points  Count back from 20 0 points 2 points  Months in reverse 0 points 0 points  Repeat phrase 0 points 0 points  Total Score 0 points 2 points    Immunizations Immunization History  Administered Date(s) Administered   Fluad Quad(high Dose 65+) 12/03/2020, 12/17/2022   Fluad Trivalent(High Dose 65+) 12/17/2022   Influenza Whole 11/19/2017   Influenza, High Dose Seasonal PF 11/26/2016, 12/01/2018, 11/30/2019, 11/21/2021   Influenza-Unspecified 11/18/2014, 11/29/2015   Moderna Covid-19 Fall Seasonal Vaccine 73yrs & older 12/03/2022   Moderna Covid-19 Vaccine  Bivalent Booster 37yrs & up 07/05/2021   Moderna Sars-Covid-2 Vaccination 02/21/2019, 03/21/2019, 12/27/2019, 06/26/2020   Pfizer Covid-19 Vaccine Bivalent Booster 55yrs & up 11/06/2020   Pneumococcal Conjugate-13 09/24/2017   Pneumococcal Polysaccharide-23 12/30/2012, 09/18/2018   Respiratory Syncytial Virus Vaccine,Recomb Aduvanted(Arexvy) 02/01/2022, 02/06/2022   Tdap 08/20/2017   Zoster Recombinant(Shingrix) 07/21/2017, 12/08/2017   Zoster, Live 05/28/2007    TDAP status: Up to date  Flu Vaccine status: Up to date  Pneumococcal vaccine status: Up to date  Covid-19 vaccine status: Completed vaccines  Qualifies for Shingles Vaccine? Yes   Zostavax completed Yes   Shingrix Completed?: Yes  Screening Tests Health Maintenance  Topic Date Due    COVID-19 Vaccine (8 - Moderna risk 2024-25 season) 10/19/2023 (Originally 06/03/2023)   INFLUENZA VACCINE  09/18/2023   HEMOGLOBIN A1C  11/05/2023   FOOT EXAM  11/06/2023   Diabetic kidney evaluation - Urine ACR  11/11/2023  OPHTHALMOLOGY EXAM  01/05/2024   Diabetic kidney evaluation - eGFR measurement  04/23/2024   MAMMOGRAM  05/11/2024   Medicare Annual Wellness (AWV)  06/14/2024   DTaP/Tdap/Td (2 - Td or Tdap) 08/21/2027   Pneumonia Vaccine 41+ Years old  Completed   DEXA SCAN  Completed   Zoster Vaccines- Shingrix  Completed   HPV VACCINES  Aged Out   Meningococcal B Vaccine  Aged Out    Health Maintenance  There are no preventive care reminders to display for this patient.   Colorectal cancer screening: No longer required.   Mammogram status: No longer required due to aged out.  Bone Density status: Completed 05/12/2022. Results reflect: Bone density results: OSTEOPOROSIS. Repeat every 2-3 years.  Lung Cancer Screening: (Low Dose CT Chest recommended if Age 7-80 years, 20 pack-year currently smoking OR have quit w/in 15years.) does not qualify.   Lung Cancer Screening Referral: NA  Additional Screening:  Hepatitis C Screening: does not qualify  Vision Screening: Recommended annual ophthalmology exams for early detection of glaucoma and other disorders of the eye. Is the patient up to date with their annual eye exam?  Yes  Who is the provider or what is the name of the office in which the patient attends annual eye exams? 01/05/2023 Dr. Karyl Paget If pt is not established with a provider, would they like to be referred to a provider to establish care? No .   Dental Screening: Recommended annual dental exams for proper oral hygiene  Diabetic Foot Exam: NA  Community Resource Referral / Chronic Care Management: CRR required this visit?  No   CCM required this visit?  No     Plan:     I have personally reviewed and noted the following in the patient's chart:    Medical and social history Use of alcohol , tobacco or illicit drugs  Current medications and supplements including opioid prescriptions. Patient is currently taking opioid prescriptions. Information provided to patient regarding non-opioid alternatives. Patient advised to discuss non-opioid treatment plan with their provider. Functional ability and status Nutritional status Physical activity Advanced directives List of other physicians Hospitalizations, surgeries, and ER visits in previous 12 months Vitals Screenings to include cognitive, depression, and falls Referrals and appointments  In addition, I have reviewed and discussed with patient certain preventive protocols, quality metrics, and best practice recommendations. A written personalized care plan for preventive services as well as general preventive health recommendations were provided to patient.     Kandi Brusseau X Daija Routson, NP   06/16/2023   After Visit Summary: (In Person-Declined) Patient declined AVS at this time.

## 2023-06-16 ENCOUNTER — Encounter: Payer: Self-pay | Admitting: Nurse Practitioner

## 2023-06-16 DIAGNOSIS — R41841 Cognitive communication deficit: Secondary | ICD-10-CM | POA: Diagnosis not present

## 2023-06-16 DIAGNOSIS — G3184 Mild cognitive impairment, so stated: Secondary | ICD-10-CM | POA: Insufficient documentation

## 2023-06-17 DIAGNOSIS — R41841 Cognitive communication deficit: Secondary | ICD-10-CM | POA: Diagnosis not present

## 2023-06-23 DIAGNOSIS — R41841 Cognitive communication deficit: Secondary | ICD-10-CM | POA: Diagnosis not present

## 2023-06-26 DIAGNOSIS — R41841 Cognitive communication deficit: Secondary | ICD-10-CM | POA: Diagnosis not present

## 2023-06-29 DIAGNOSIS — H35722 Serous detachment of retinal pigment epithelium, left eye: Secondary | ICD-10-CM | POA: Diagnosis not present

## 2023-06-29 DIAGNOSIS — H04123 Dry eye syndrome of bilateral lacrimal glands: Secondary | ICD-10-CM | POA: Diagnosis not present

## 2023-06-29 DIAGNOSIS — H40013 Open angle with borderline findings, low risk, bilateral: Secondary | ICD-10-CM | POA: Diagnosis not present

## 2023-06-29 DIAGNOSIS — H353132 Nonexudative age-related macular degeneration, bilateral, intermediate dry stage: Secondary | ICD-10-CM | POA: Diagnosis not present

## 2023-06-30 DIAGNOSIS — M546 Pain in thoracic spine: Secondary | ICD-10-CM | POA: Diagnosis not present

## 2023-06-30 DIAGNOSIS — S22050G Wedge compression fracture of T5-T6 vertebra, subsequent encounter for fracture with delayed healing: Secondary | ICD-10-CM | POA: Diagnosis not present

## 2023-07-01 DIAGNOSIS — R41841 Cognitive communication deficit: Secondary | ICD-10-CM | POA: Diagnosis not present

## 2023-07-07 DIAGNOSIS — R41841 Cognitive communication deficit: Secondary | ICD-10-CM | POA: Diagnosis not present

## 2023-07-08 DIAGNOSIS — R41841 Cognitive communication deficit: Secondary | ICD-10-CM | POA: Diagnosis not present

## 2023-07-15 DIAGNOSIS — R41841 Cognitive communication deficit: Secondary | ICD-10-CM | POA: Diagnosis not present

## 2023-07-17 DIAGNOSIS — R41841 Cognitive communication deficit: Secondary | ICD-10-CM | POA: Diagnosis not present

## 2023-07-20 DIAGNOSIS — R41841 Cognitive communication deficit: Secondary | ICD-10-CM | POA: Diagnosis not present

## 2023-07-20 DIAGNOSIS — R29898 Other symptoms and signs involving the musculoskeletal system: Secondary | ICD-10-CM | POA: Diagnosis not present

## 2023-07-20 DIAGNOSIS — R296 Repeated falls: Secondary | ICD-10-CM | POA: Diagnosis not present

## 2023-07-22 ENCOUNTER — Other Ambulatory Visit: Payer: Self-pay | Admitting: Adult Health

## 2023-07-22 DIAGNOSIS — M549 Dorsalgia, unspecified: Secondary | ICD-10-CM

## 2023-07-22 MED ORDER — HYDROCODONE-ACETAMINOPHEN 5-325 MG PO TABS: 1.0000 | ORAL_TABLET | Freq: Three times a day (TID) | ORAL | 0 refills | Status: DC | PRN

## 2023-07-23 DIAGNOSIS — R296 Repeated falls: Secondary | ICD-10-CM | POA: Diagnosis not present

## 2023-07-23 DIAGNOSIS — R41841 Cognitive communication deficit: Secondary | ICD-10-CM | POA: Diagnosis not present

## 2023-07-23 DIAGNOSIS — R29898 Other symptoms and signs involving the musculoskeletal system: Secondary | ICD-10-CM | POA: Diagnosis not present

## 2023-07-24 ENCOUNTER — Non-Acute Institutional Stay: Payer: Self-pay | Admitting: Nurse Practitioner

## 2023-07-24 ENCOUNTER — Encounter: Payer: Self-pay | Admitting: Nurse Practitioner

## 2023-07-24 DIAGNOSIS — E785 Hyperlipidemia, unspecified: Secondary | ICD-10-CM

## 2023-07-24 DIAGNOSIS — K5904 Chronic idiopathic constipation: Secondary | ICD-10-CM

## 2023-07-24 DIAGNOSIS — M1990 Unspecified osteoarthritis, unspecified site: Secondary | ICD-10-CM | POA: Diagnosis not present

## 2023-07-24 DIAGNOSIS — Z8669 Personal history of other diseases of the nervous system and sense organs: Secondary | ICD-10-CM | POA: Diagnosis not present

## 2023-07-24 DIAGNOSIS — F5101 Primary insomnia: Secondary | ICD-10-CM | POA: Diagnosis not present

## 2023-07-24 DIAGNOSIS — M810 Age-related osteoporosis without current pathological fracture: Secondary | ICD-10-CM

## 2023-07-24 NOTE — Assessment & Plan Note (Signed)
 takes Reclast  in the past,  t-score -3.6 09/13/19, -3.5 05/12/22, pending Osteoporosis clinic f/u

## 2023-07-24 NOTE — Assessment & Plan Note (Signed)
 no active seizures since last seen, failed generic Tegretol  in the past, on Tegretol ,  Clonazepam  hs (GDR to stop on her own didn't cause active seizures, but flare up insomnia and anxiety). S/p Neurology

## 2023-07-24 NOTE — Assessment & Plan Note (Signed)
 takes Atorvastatin 5mg  qd, LDL 90 05/05/23

## 2023-07-24 NOTE — Assessment & Plan Note (Signed)
 takes Clonazepam , failed GDR,  failed Ambien , declined Effexor . TSH 2.49 05/05/23 Clarification Clonazepam , c/o pharmacy split Clonazepam  1mg  into 0.5mg  unevenly that affected her night sleep. Will request pharm to send Clonazepam  1mg +Clonazepam  0.5mg  separately for Clonazepam  1.5mg  at bedtime.  Adding Tumeric 500mg  every day+Mg 160mg  every day po @5pm .

## 2023-07-24 NOTE — Progress Notes (Signed)
 Location:   [facility] Room Number: 812 Place of Service:  AL[facility] (13) Provider: Abner Hoffman Takera Rayl NP  Maher Shon X, NP  Patient Care Team: Abryana Lykens X, NP as PCP - General (Internal Medicine) Anitria Andon X, NP as Nurse Practitioner (Internal Medicine)  Extended Emergency Contact Information Primary Emergency Contact: [name],[name]  [place] Mobile Phone: [phone] Relation: Friend  Code Status:  DNR Goals of care: Advanced Directive information    06/15/2023    3:25 PM  Advanced Directives  Does Patient Have a Medical Advance Directive? Yes  Type of Estate agent of [place];Living will;Out of facility DNR (pink MOST or yellow form)  Does patient want to make changes to medical advance directive? No - Patient declined  Copy of Healthcare Power of Attorney in Chart? Yes - validated most recent copy scanned in chart (See row information)  Pre-existing out of facility DNR order (yellow form or pink MOST form) Yellow form placed in chart (order not valid for inpatient use)     Chief Complaint  Patient presents with   Medical Management of Chronic Issues    HPI:  Pt is a 83 y.o. female seen today for medical management of chronic diseases.    ED 04/24/23 T6 fx on CT T spine, L spine. Norco, Tylenol  for pain.  ED 04/23/23 for dizziness, fall, head injury/scal laceration, etiology was deemed to active seizure, followed by Neurology, CT head/cervical spine, EKG, labs unremarkable.              OA, s/p left hip surgery, s/p Kyphplasty             Dizziness: recurrent when not sleeping well.               Post op anemia, Hgb 13.2 04/24/23             Seizures, no active seizures since last seen, failed generic Tegretol  in the past, on Tegretol ,  Clonazepam  hs (GDR to stop on her own didn't cause active seizures, but flare up insomnia and anxiety). S/p Neurology              Constipation, stable, on MiraLax , Senokot S II bid.               Hyperlipidemia, takes Atorvastatin  5mg  qd, LDL 90 05/05/23             Prediabetic, Hgb a1c 5.7 04/25/21<<6.2 06/03/22>>5.9 3/18/2, lifestyle modification ACR 5 11/11/22             OP takes Reclast  in the past,  t-score -3.6 09/13/19, -3.5 05/12/22, pending Osteoporosis clinic f/u             Insomnia/anxiety, takes Clonazepam , failed GDR,  failed Ambien , declined Effexor . TSH 2.49 05/05/23             Erosive gastropathy, off PPI     Past Medical History:  Diagnosis Date   Anxiety    Cataract    Chronic constipation    Depression    Osteoporosis    Seizures ([name])    epilepsy  last seizure 1977   Past Surgical History:  Procedure Laterality Date   ABDOMINAL HYSTERECTOMY  1995   Dr. Amado Bacon   CATARACT EXTRACTION Bilateral 12/2016   COLONOSCOPY  2012   patchy increased intraepithelial lymphocytes - draelos   ESOPHAGOGASTRODUODENOSCOPY     multiple   FISSURECTOMY     INTRAMEDULLARY (IM) NAIL INTERTROCHANTERIC Left 05/09/2021  Procedure: INTRAMEDULLARY (IM) NAIL INTERTROCHANTRIC;  Surgeon: Jasmine Mesi, MD;  Location: [facility] OR;  Service: Orthopedics;  Laterality: Left;   IR KYPHO EA ADDL LEVEL THORACIC OR LUMBAR  06/26/2021   IR KYPHO THORACIC WITH BONE BIOPSY  06/26/2021   WRIST SURGERY Left    due to fracture    Allergies  Allergen Reactions   Carbamazepine  Other (See Comments)    HAS TO TAKE NAME BRAND ONLY TEGRETOL  OR A SEIZURE OCCURS   Other Other (See Comments)    NO FOODS WITH Seeds -- Reflux   Cat Dander Swelling and Other (See Comments)    Patient states that it causes her eyes to swell shut   Cefaclor Other (See Comments)    Unknown reaction   Codeine Nausea Only   Fish Allergy Nausea Only   Iodinated Contrast Media Other (See Comments)    Unknown reaction     Penicillins Itching   Percocet [Oxycodone -Acetaminophen ] Nausea And Vomiting   Red Dye #40 (Allura Red) Other (See Comments)    Reflux   Senna-Docusate Sodium  [Sennosides-Docusate Sodium ] Other (See  Comments)    Severe stomach pains   Shellfish-Derived Products Nausea Only   Shrimp (Diagnostic) Nausea Only    Stomach pain    Tomato Other (See Comments)    Reflux   Hm Lidocaine  Patch [Lidocaine ] Rash    Says that this was related to putting a heating pad on the area with the lidocaine  patch, has used lidocaine  patches in other areas without a reaction    Allergies as of 07/24/2023       Reactions   Carbamazepine  Other (See Comments)   HAS TO TAKE NAME BRAND ONLY TEGRETOL  OR A SEIZURE OCCURS   Other Other (See Comments)   NO FOODS WITH Seeds -- Reflux   Cat Dander Swelling, Other (See Comments)   Patient states that it causes her eyes to swell shut   Cefaclor Other (See Comments)   Unknown reaction   Codeine Nausea Only   Fish Allergy Nausea Only   Iodinated Contrast Media Other (See Comments)   Unknown reaction   Penicillins Itching   Percocet [oxycodone -acetaminophen ] Nausea And Vomiting   Red Dye #40 (allura Red) Other (See Comments)   Reflux   Senna-docusate Sodium  [sennosides-docusate Sodium ] Other (See Comments)   Severe stomach pains   Shellfish-derived Products Nausea Only   Shrimp (diagnostic) Nausea Only   Stomach pain   Tomato Other (See Comments)   Reflux   Hm Lidocaine  Patch [lidocaine ] Rash   Says that this was related to putting a heating pad on the area with the lidocaine  patch, has used lidocaine  patches in other areas without a reaction        Medication List        Accurate as of July 24, 2023 12:30 PM. If you have any questions, ask your nurse or doctor.          acetaminophen  500 MG tablet Commonly known as: TYLENOL  Take 2 tablets (1,000 mg total) by mouth every 8 (eight) hours as needed (for pain).   atorvastatin  10 MG tablet Commonly known as: LIPITOR TAKE 1/2 TABLET DAILY   Caltrate 600+D Plus Minerals 600-800 MG-UNIT Chew Chew 1 each by mouth 2 (two) times daily.   clonazePAM  1 MG tablet Commonly known as: KLONOPIN  Take 1.5  tablets (1.5 mg total) by mouth at bedtime.   ENSURE MAX PROTEIN PO Take 1 Bottle by mouth See admin instructions. Drink 1 bottle by mouth once  a day, alternating with one bottle of Ensure original formula every other day   Ensure Original Liqd Take 1 Bottle by mouth See admin instructions. Drink 1 bottle by mouth every other day- alternating with one bottle of Ensure High Protein formula every other day   HYDROcodone -acetaminophen  5-325 MG tablet Commonly known as: NORCO/VICODIN Take 1 tablet by mouth every 8 (eight) hours as needed.   lidocaine  4 % Place 1 patch onto the skin daily.   polyethylene glycol powder 17 GM/SCOOP powder Commonly known as: GLYCOLAX /MIRALAX  Take 17 g by mouth daily.   PreserVision AREDS 2 Caps Take 1 capsule by mouth in the morning and at bedtime.   Soothe XP Soln Place 1 drop into both eyes 3 (three) times daily.   Systane 0.4-0.3 % Gel ophthalmic gel Generic drug: Polyethyl Glycol-Propyl Glycol Place 1 Application into both eyes at bedtime.   TEGretol  200 MG tablet Generic drug: carbamazepine  TAKE 1 AND 1/2 TABLETS BY MOUTH AT BEDTIME   Vitamin D  (Ergocalciferol ) 50000 units Caps TAKE ONE CAPSULE BY MOUTH ONCE A WEEK        Review of Systems  Constitutional:  Negative for appetite change, fatigue and fever.  HENT:  Positive for hearing loss. Negative for congestion and voice change.   Eyes:  Negative for visual disturbance.  Respiratory:  Negative for shortness of breath.   Cardiovascular:  Negative for leg swelling.  Gastrointestinal:  Negative for abdominal pain and constipation.  Genitourinary:  Positive for frequency. Negative for dysuria and urgency.       The patient stated she pushes her suprapubic region to help emptying her bladder  Musculoskeletal:  Positive for arthralgias, back pain and gait problem.       Left hip pain when walking, better.  Mid back pain  Skin:  Negative for color change.       Left hip surgical incision  is healed.   Neurological:  Positive for numbness. Negative for seizures and weakness.       Tingling/numbness left foot sometimes. Occasionally left hip/leg pain sometimes. Lightheaded when not sleeping well in lifetime.   Psychiatric/Behavioral:  Negative for behavioral problems and sleep disturbance. The patient is not nervous/anxious.        Feels lightheaded if not sleep well at night.    Immunization History  Administered Date(s) Administered   Fluad Quad(high Dose 65+) 12/03/2020, 12/17/2022   Fluad Trivalent(High Dose 65+) 12/17/2022   Influenza Whole 11/19/2017   Influenza, High Dose Seasonal PF 11/26/2016, 12/01/2018, 11/30/2019, 11/21/2021   Influenza-Unspecified 11/18/2014, 11/29/2015   Moderna Covid-19 Fall Seasonal Vaccine 83yrs & older 12/03/2022   Moderna Covid-19 Vaccine  Bivalent Booster 65yrs & up 07/05/2021   Moderna Sars-Covid-2 Vaccination 02/21/2019, 03/21/2019, 12/27/2019, 06/26/2020   Pfizer Covid-19 Vaccine Bivalent Booster 70yrs & up 11/06/2020   Pneumococcal Conjugate-13 09/24/2017   Pneumococcal Polysaccharide-23 12/30/2012, 09/18/2018   Respiratory Syncytial Virus Vaccine,Recomb Aduvanted(Arexvy) 02/01/2022, 02/06/2022   Tdap 08/20/2017   Zoster Recombinant(Shingrix) 07/21/2017, 12/08/2017   Zoster, Live 05/28/2007   Pertinent  Health Maintenance Due  Topic Date Due   INFLUENZA VACCINE  09/18/2023   HEMOGLOBIN A1C  11/05/2023   FOOT EXAM  11/06/2023   OPHTHALMOLOGY EXAM  01/05/2024   MAMMOGRAM  05/11/2024   DEXA SCAN  Completed      12/11/2022   12:57 PM 01/02/2023   10:47 AM 01/09/2023   10:38 AM 02/05/2023    2:39 PM 04/30/2023    1:47 PM  Fall Risk  Falls in the past  year? 0 0 0 0 1  Was there an injury with Fall? 0 0 0 0 1  Fall Risk Category Calculator 0 0 0 0 3  Patient at Risk for Falls Due to No Fall Risks No Fall Risks No Fall Risks  History of fall(s)  Fall risk Follow up Falls evaluation completed Falls evaluation completed Falls  evaluation completed;Education provided;Falls prevention discussed  Falls evaluation completed   Functional Status Survey:    Vitals:   07/24/23 1214  BP: 131/63  Pulse: 65  Resp: 20  Temp: 98 F (36.7 C)  SpO2: 98%  Weight: 106 lb 3.2 oz (48.2 kg)   Body mass index is 17.67 kg/m. Physical Exam Vitals and nursing note reviewed.  Constitutional:      Appearance: Normal appearance.  HENT:     Head: Normocephalic and atraumatic.     Mouth/Throat:     Mouth: Mucous membranes are moist.  Eyes:     Extraocular Movements: Extraocular movements intact.     Conjunctiva/sclera: Conjunctivae normal.     Pupils: Pupils are equal, round, and reactive to light.  Cardiovascular:     Rate and Rhythm: Normal rate and regular rhythm.     Heart sounds: No murmur heard. Pulmonary:     Effort: Pulmonary effort is normal.     Breath sounds: No rales.  Abdominal:     General: Bowel sounds are normal.     Palpations: Abdomen is soft.     Tenderness: There is no abdominal tenderness.  Musculoskeletal:        General: Tenderness present.     Cervical back: Normal range of motion and neck supple.     Right lower leg: No edema.     Left lower leg: No edema.     Comments: S/p left hip ORIF.  Mid back tenderness  Skin:    General: Skin is warm and dry.     Comments: Left hip surgical scar   Neurological:     General: No focal deficit present.     Mental Status: She is alert and oriented to person, place, and time. Mental status is at baseline.     Gait: Gait abnormal.  Psychiatric:        Mood and Affect: Mood normal.        Behavior: Behavior normal.        Thought Content: Thought content normal.        Judgment: Judgment normal.     Comments: Stated she feels sad during holidays, but no change of her participation in playing cards, dinning in dinning room with others, taking [facility] bus for shopping, singing programs, etc.      Labs reviewed: Recent Labs    01/13/23 0725  04/23/23 1614 04/24/23 1613  NA 141 140 141  K 3.8 4.1 3.9  CL 103 105 104  CO2 28 25 27   GLUCOSE 110* 108* 118*  BUN 19 26* 28*  CREATININE 0.67 0.55 0.61  CALCIUM  9.4 9.6 9.3   Recent Labs    11/11/22 0725 04/23/23 1614  AST 22 29  ALT 19 25  ALKPHOS  --  39  BILITOT 0.6 0.5  PROT 6.3 6.6  ALBUMIN  --  4.3   Recent Labs    01/13/23 0725 04/23/23 1614 04/24/23 1613  WBC 5.4 9.1 8.1  NEUTROABS  --  7.3  --   HGB 14.0 13.8 13.2  HCT 41.8 41.6 41.2  MCV 99.8 101.5* 102.0*  PLT 207 177  172   Lab Results  Component Value Date   TSH 2.49 05/05/2023   Lab Results  Component Value Date   HGBA1C 5.9 05/05/2023   Lab Results  Component Value Date   CHOL 171 05/05/2023   HDL 61 05/05/2023   LDLCALC 90 05/05/2023   TRIG 104 05/05/2023   CHOLHDL 3.0 06/03/2022    Significant Diagnostic Results in last 30 days:  No results found.  Assessment/Plan  Osteoarthritis s/p left hip surgery, s/p Kyphplasty 04/24/23 T6 fx on CT T spine, L spine. Norco, Tylenol  for pain.   History of epilepsy no active seizures since last seen, failed generic Tegretol  in the past, on Tegretol ,  Clonazepam  hs (GDR to stop on her own didn't cause active seizures, but flare up insomnia and anxiety). S/p Neurology   Constipation stable, on MiraLax , Senokot S II bid.   Hyperlipidemia  takes Atorvastatin  5mg  qd, LDL 90 05/05/23  Osteoporosis  takes Reclast  in the past,  t-score -3.6 09/13/19, -3.5 05/12/22, pending Osteoporosis clinic f/u   Family/ staff Communication: plan of care reviewed with the patient and charge nurse.   Labs/tests ordered:  none

## 2023-07-24 NOTE — Assessment & Plan Note (Signed)
stable, on MiraLax, Senokot S II bid.

## 2023-07-24 NOTE — Assessment & Plan Note (Signed)
 s/p left hip surgery, s/p Kyphplasty 04/24/23 T6 fx on CT T spine, L spine. Norco, Tylenol  for pain.

## 2023-07-27 ENCOUNTER — Non-Acute Institutional Stay: Payer: Self-pay | Admitting: Nurse Practitioner

## 2023-07-27 ENCOUNTER — Encounter: Payer: Self-pay | Admitting: Nurse Practitioner

## 2023-07-27 DIAGNOSIS — F5101 Primary insomnia: Secondary | ICD-10-CM

## 2023-07-27 DIAGNOSIS — R42 Dizziness and giddiness: Secondary | ICD-10-CM

## 2023-07-27 DIAGNOSIS — Z8669 Personal history of other diseases of the nervous system and sense organs: Secondary | ICD-10-CM

## 2023-07-27 DIAGNOSIS — M1990 Unspecified osteoarthritis, unspecified site: Secondary | ICD-10-CM

## 2023-07-27 DIAGNOSIS — K5904 Chronic idiopathic constipation: Secondary | ICD-10-CM | POA: Diagnosis not present

## 2023-07-27 NOTE — Assessment & Plan Note (Signed)
 Sleeps better,  takes Clonazepam , failed GDR,  Mg, Tumeric, failed Ambien , declined Effexor . TSH 2.49 05/05/23

## 2023-07-27 NOTE — Assessment & Plan Note (Signed)
 reported the patient c/o dizziness for a few weeks and her friend suggested Meclizine . The patient stated her dizziness is getting better since she sleeps better now after Tumeric and Mg added, Clonazepam  1.5mg  dose was provided with 1mg  tab+0.5mg  tab instead split the 1mg  tab into 0.5mg . she declined Meclizine  and neurological evaluation. No noted focal neurological symptoms today.   recurrent when not sleeping well.

## 2023-07-27 NOTE — Assessment & Plan Note (Signed)
 no active seizures since last seen, failed generic Tegretol  in the past, on Tegretol ,  Clonazepam  hs (GDR to stop on her own didn't cause active seizures, but flare up insomnia and anxiety). S/p Neurology

## 2023-07-27 NOTE — Progress Notes (Signed)
 Location:   [facility] Room Number: 812 Place of Service:  ALF (13) Provider: Abner Hoffman Tuleen Mandelbaum NP  Lei Dower X, NP  Patient Care Team: Neriah Brott X, NP as PCP - General (Internal Medicine) Zairah Arista X, NP as Nurse Practitioner (Internal Medicine)  Extended Emergency Contact Information Primary Emergency Contact: [name],[name]  [place] Mobile Phone: [phone] Relation: Friend  Code Status: DNR Goals of care: Advanced Directive information    06/15/2023    3:25 PM  Advanced Directives  Does Patient Have a Medical Advance Directive? Yes  Type of Estate agent of [place];Living will;Out of facility DNR (pink MOST or yellow form)  Does patient want to make changes to medical advance directive? No - Patient declined  Copy of Healthcare Power of Attorney in Chart? Yes - validated most recent copy scanned in chart (See row information)  Pre-existing out of facility DNR order (yellow form or pink MOST form) Yellow form placed in chart (order not valid for inpatient use)     Chief Complaint  Patient presents with   Acute Visit    C/o dizziness    HPI:  Pt is a 82 y.o. female seen today for an acute visit for reported the patient c/o dizziness for a few weeks and her friend suggested Meclizine . The patient stated her dizziness is getting better since she sleeps better now after Tumeric and Mg added, Clonazepam  1.5mg  dose was provided with 1mg  tab+0.5mg  tab instead split the 1mg  tab into 0.5mg . she declined Meclizine  and neurological evaluation. No noted focal neurological symptoms today.   ED 04/24/23 T6 fx on CT T spine, L spine. Norco, Tylenol  for pain.  ED 04/23/23 for dizziness, fall, head injury/scal laceration, etiology was deemed to active seizure, followed by Neurology, CT head/cervical spine, EKG, labs unremarkable.              OA, s/p left hip surgery, s/p Kyphplasty             Dizziness: recurrent when not sleeping well.                Post op anemia, Hgb 13.2 04/24/23             Seizures, no active seizures since last seen, failed generic Tegretol  in the past, on Tegretol ,  Clonazepam  hs (GDR to stop on her own didn't cause active seizures, but flare up insomnia and anxiety). S/p Neurology              Constipation, stable, on MiraLax , Senokot S II bid.              Hyperlipidemia, takes Atorvastatin  5mg  qd, LDL 90 05/05/23             Prediabetic, Hgb a1c 5.7 04/25/21<<6.2 06/03/22>>5.9 3/18/2, lifestyle modification ACR 5 11/11/22             OP takes Reclast  in the past,  t-score -3.6 09/13/19, -3.5 05/12/22, pending Osteoporosis clinic f/u             Insomnia/anxiety, takes Clonazepam , failed GDR,  Mg, Tumeric, failed Ambien , declined Effexor . TSH 2.49 05/05/23             Erosive gastropathy, off PPI   Past Medical History:  Diagnosis Date   Anxiety    Cataract    Chronic constipation    Depression    Osteoporosis    Seizures ([name])    epilepsy  last seizure 1977  Past Surgical History:  Procedure Laterality Date   ABDOMINAL HYSTERECTOMY  1995   Dr. Amado Bacon   CATARACT EXTRACTION Bilateral 12/2016   COLONOSCOPY  2012   patchy increased intraepithelial lymphocytes - draelos   ESOPHAGOGASTRODUODENOSCOPY     multiple   FISSURECTOMY     INTRAMEDULLARY (IM) NAIL INTERTROCHANTERIC Left 05/09/2021   Procedure: INTRAMEDULLARY (IM) NAIL INTERTROCHANTRIC;  Surgeon: Jasmine Mesi, MD;  Location: [facility] OR;  Service: Orthopedics;  Laterality: Left;   IR KYPHO EA ADDL LEVEL THORACIC OR LUMBAR  06/26/2021   IR KYPHO THORACIC WITH BONE BIOPSY  06/26/2021   WRIST SURGERY Left    due to fracture    Allergies  Allergen Reactions   Carbamazepine  Other (See Comments)    HAS TO TAKE NAME BRAND ONLY TEGRETOL  OR A SEIZURE OCCURS   Other Other (See Comments)    NO FOODS WITH Seeds -- Reflux   Cat Dander Swelling and Other (See Comments)    Patient states that it causes her eyes to swell shut   Cefaclor Other (See Comments)     Unknown reaction   Codeine Nausea Only   Fish Allergy Nausea Only   Iodinated Contrast Media Other (See Comments)    Unknown reaction     Penicillins Itching   Percocet [Oxycodone -Acetaminophen ] Nausea And Vomiting   Red Dye #40 (Allura Red) Other (See Comments)    Reflux   Senna-Docusate Sodium  [Sennosides-Docusate Sodium ] Other (See Comments)    Severe stomach pains   Shellfish-Derived Products Nausea Only   Shrimp (Diagnostic) Nausea Only    Stomach pain    Tomato Other (See Comments)    Reflux   Hm Lidocaine  Patch [Lidocaine ] Rash    Says that this was related to putting a heating pad on the area with the lidocaine  patch, has used lidocaine  patches in other areas without a reaction    Allergies as of 07/27/2023       Reactions   Carbamazepine  Other (See Comments)   HAS TO TAKE NAME BRAND ONLY TEGRETOL  OR A SEIZURE OCCURS   Other Other (See Comments)   NO FOODS WITH Seeds -- Reflux   Cat Dander Swelling, Other (See Comments)   Patient states that it causes her eyes to swell shut   Cefaclor Other (See Comments)   Unknown reaction   Codeine Nausea Only   Fish Allergy Nausea Only   Iodinated Contrast Media Other (See Comments)   Unknown reaction   Penicillins Itching   Percocet [oxycodone -acetaminophen ] Nausea And Vomiting   Red Dye #40 (allura Red) Other (See Comments)   Reflux   Senna-docusate Sodium  [sennosides-docusate Sodium ] Other (See Comments)   Severe stomach pains   Shellfish-derived Products Nausea Only   Shrimp (diagnostic) Nausea Only   Stomach pain   Tomato Other (See Comments)   Reflux   Hm Lidocaine  Patch [lidocaine ] Rash   Says that this was related to putting a heating pad on the area with the lidocaine  patch, has used lidocaine  patches in other areas without a reaction        Medication List        Accurate as of July 27, 2023  2:19 PM. If you have any questions, ask your nurse or doctor.          acetaminophen  500 MG  tablet Commonly known as: TYLENOL  Take 2 tablets (1,000 mg total) by mouth every 8 (eight) hours as needed (for pain).   atorvastatin  10 MG tablet Commonly known as: LIPITOR TAKE  1/2 TABLET DAILY   Caltrate 600+D Plus Minerals 600-800 MG-UNIT Chew Chew 1 each by mouth 2 (two) times daily.   clonazePAM  1 MG tablet Commonly known as: KLONOPIN  Take 1.5 tablets (1.5 mg total) by mouth at bedtime.   ENSURE MAX PROTEIN PO Take 1 Bottle by mouth See admin instructions. Drink 1 bottle by mouth once a day, alternating with one bottle of Ensure original formula every other day   Ensure Original Liqd Take 1 Bottle by mouth See admin instructions. Drink 1 bottle by mouth every other day- alternating with one bottle of Ensure High Protein formula every other day   HYDROcodone -acetaminophen  5-325 MG tablet Commonly known as: NORCO/VICODIN Take 1 tablet by mouth every 8 (eight) hours as needed.   lidocaine  4 % Place 1 patch onto the skin daily.   polyethylene glycol powder 17 GM/SCOOP powder Commonly known as: GLYCOLAX /MIRALAX  Take 17 g by mouth daily.   PreserVision AREDS 2 Caps Take 1 capsule by mouth in the morning and at bedtime.   Soothe XP Soln Place 1 drop into both eyes 3 (three) times daily.   Systane 0.4-0.3 % Gel ophthalmic gel Generic drug: Polyethyl Glycol-Propyl Glycol Place 1 Application into both eyes at bedtime.   TEGretol  200 MG tablet Generic drug: carbamazepine  TAKE 1 AND 1/2 TABLETS BY MOUTH AT BEDTIME   Vitamin D  (Ergocalciferol ) 50000 units Caps TAKE ONE CAPSULE BY MOUTH ONCE A WEEK        Review of Systems  Constitutional:  Negative for appetite change, fatigue and fever.  HENT:  Positive for hearing loss. Negative for congestion and voice change.   Eyes:  Negative for visual disturbance.  Respiratory:  Negative for shortness of breath.   Cardiovascular:  Negative for leg swelling.  Gastrointestinal:  Negative for abdominal pain and constipation.   Genitourinary:  Positive for frequency. Negative for dysuria and urgency.       The patient stated she pushes her suprapubic region to help emptying her bladder  Musculoskeletal:  Positive for arthralgias, back pain and gait problem.       Left hip pain when walking, better.  Mid back pain  Skin:  Negative for color change.       Left hip surgical incision is healed.   Neurological:  Positive for dizziness and numbness. Negative for seizures, facial asymmetry, speech difficulty and weakness.       Tingling/numbness left foot sometimes. Occasionally left hip/leg pain sometimes. Lightheaded when not sleeping well in lifetime.   Psychiatric/Behavioral:  Negative for behavioral problems and sleep disturbance. The patient is not nervous/anxious.        Feels lightheaded if not sleep well at night.    Immunization History  Administered Date(s) Administered   Fluad Quad(high Dose 65+) 12/03/2020, 12/17/2022   Fluad Trivalent(High Dose 65+) 12/17/2022   Influenza Whole 11/19/2017   Influenza, High Dose Seasonal PF 11/26/2016, 12/01/2018, 11/30/2019, 11/21/2021   Influenza-Unspecified 11/18/2014, 11/29/2015   Moderna Covid-19 Fall Seasonal Vaccine 52yrs & older 12/03/2022   Moderna Covid-19 Vaccine  Bivalent Booster 74yrs & up 07/05/2021   Moderna Sars-Covid-2 Vaccination 02/21/2019, 03/21/2019, 12/27/2019, 06/26/2020   Pfizer Covid-19 Vaccine Bivalent Booster 61yrs & up 11/06/2020   Pneumococcal Conjugate-13 09/24/2017   Pneumococcal Polysaccharide-23 12/30/2012, 09/18/2018   Respiratory Syncytial Virus Vaccine,Recomb Aduvanted(Arexvy) 02/01/2022, 02/06/2022   Tdap 08/20/2017   Zoster Recombinant(Shingrix) 07/21/2017, 12/08/2017   Zoster, Live 05/28/2007   Pertinent  Health Maintenance Due  Topic Date Due   INFLUENZA VACCINE  09/18/2023  HEMOGLOBIN A1C  11/05/2023   FOOT EXAM  11/06/2023   OPHTHALMOLOGY EXAM  01/05/2024   MAMMOGRAM  05/11/2024   DEXA SCAN  Completed      12/11/2022    12:57 PM 01/02/2023   10:47 AM 01/09/2023   10:38 AM 02/05/2023    2:39 PM 04/30/2023    1:47 PM  Fall Risk  Falls in the past year? 0 0 0 0 1  Was there an injury with Fall? 0 0 0 0 1  Fall Risk Category Calculator 0 0 0 0 3  Patient at Risk for Falls Due to No Fall Risks No Fall Risks No Fall Risks  History of fall(s)  Fall risk Follow up Falls evaluation completed Falls evaluation completed Falls evaluation completed;Education provided;Falls prevention discussed  Falls evaluation completed   Functional Status Survey:    Vitals:   07/27/23 1406  BP: 131/63  Pulse: 65  Resp: 20  Temp: 98 F (36.7 C)  SpO2: 98%  Weight: 106 lb 3.2 oz (48.2 kg)   Body mass index is 17.67 kg/m. Physical Exam Vitals and nursing note reviewed.  Constitutional:      Appearance: Normal appearance.  HENT:     Head: Normocephalic and atraumatic.     Mouth/Throat:     Mouth: Mucous membranes are moist.  Eyes:     Extraocular Movements: Extraocular movements intact.     Conjunctiva/sclera: Conjunctivae normal.     Pupils: Pupils are equal, round, and reactive to light.  Cardiovascular:     Rate and Rhythm: Normal rate and regular rhythm.     Heart sounds: No murmur heard. Pulmonary:     Effort: Pulmonary effort is normal.     Breath sounds: No rales.  Abdominal:     General: Bowel sounds are normal.     Palpations: Abdomen is soft.     Tenderness: There is no abdominal tenderness.  Musculoskeletal:        General: Tenderness present.     Cervical back: Normal range of motion and neck supple.     Right lower leg: No edema.     Left lower leg: No edema.     Comments: S/p left hip ORIF.  Mid back tenderness is improved.   Skin:    General: Skin is warm and dry.     Comments: Left hip surgical scar   Neurological:     General: No focal deficit present.     Mental Status: She is alert and oriented to person, place, and time. Mental status is at baseline.     Cranial Nerves: No  cranial nerve deficit.     Motor: No weakness.     Coordination: Coordination normal.     Gait: Gait abnormal.     Deep Tendon Reflexes: Reflexes normal.  Psychiatric:        Mood and Affect: Mood normal.        Behavior: Behavior normal.        Thought Content: Thought content normal.        Judgment: Judgment normal.     Comments: Stated she feels sad during holidays, but no change of her participation in playing cards, dinning in dinning room with others, taking [facility] bus for shopping, singing programs, etc.      Labs reviewed: Recent Labs    01/13/23 0725 04/23/23 1614 04/24/23 1613  NA 141 140 141  K 3.8 4.1 3.9  CL 103 105 104  CO2 28 25 27   GLUCOSE  110* 108* 118*  BUN 19 26* 28*  CREATININE 0.67 0.55 0.61  CALCIUM  9.4 9.6 9.3   Recent Labs    11/11/22 0725 04/23/23 1614  AST 22 29  ALT 19 25  ALKPHOS  --  39  BILITOT 0.6 0.5  PROT 6.3 6.6  ALBUMIN  --  4.3   Recent Labs    01/13/23 0725 04/23/23 1614 04/24/23 1613  WBC 5.4 9.1 8.1  NEUTROABS  --  7.3  --   HGB 14.0 13.8 13.2  HCT 41.8 41.6 41.2  MCV 99.8 101.5* 102.0*  PLT 207 177 172   Lab Results  Component Value Date   TSH 2.49 05/05/2023   Lab Results  Component Value Date   HGBA1C 5.9 05/05/2023   Lab Results  Component Value Date   CHOL 171 05/05/2023   HDL 61 05/05/2023   LDLCALC 90 05/05/2023   TRIG 104 05/05/2023   CHOLHDL 3.0 06/03/2022    Significant Diagnostic Results in last 30 days:  No results found.  Assessment/Plan: Vertigo  reported the patient c/o dizziness for a few weeks and her friend suggested Meclizine . The patient stated her dizziness is getting better since she sleeps better now after Tumeric and Mg added, Clonazepam  1.5mg  dose was provided with 1mg  tab+0.5mg  tab instead split the 1mg  tab into 0.5mg . she declined Meclizine  and neurological evaluation. No noted focal neurological symptoms today.   recurrent when not sleeping well.   Osteoarthritis ED 04/24/23 T6  fx on CT T spine, L spine. Norco, Tylenol  for pain.             OA, s/p left hip surgery, s/p Kyphplasty  The patient stated her back pain is better, will use less Norco to eliminate possible contributory etiology to vertigo.    History of epilepsy no active seizures since last seen, failed generic Tegretol  in the past, on Tegretol ,  Clonazepam  hs (GDR to stop on her own didn't cause active seizures, but flare up insomnia and anxiety). S/p Neurology   Constipation stable, on MiraLax , Senokot S II bid.     Family/ staff Communication: plan of care reviewed with the patient and charge nurse.   Labs/tests ordered:  none

## 2023-07-27 NOTE — Assessment & Plan Note (Signed)
stable, on MiraLax, Senokot S II bid.

## 2023-07-27 NOTE — Assessment & Plan Note (Signed)
 ED 04/24/23 T6 fx on CT T spine, L spine. Norco, Tylenol  for pain.             OA, s/p left hip surgery, s/p Kyphplasty  The patient stated her back pain is better, will use less Norco to eliminate possible contributory etiology to vertigo.

## 2023-07-29 DIAGNOSIS — R296 Repeated falls: Secondary | ICD-10-CM | POA: Diagnosis not present

## 2023-07-29 DIAGNOSIS — R29898 Other symptoms and signs involving the musculoskeletal system: Secondary | ICD-10-CM | POA: Diagnosis not present

## 2023-07-29 DIAGNOSIS — R41841 Cognitive communication deficit: Secondary | ICD-10-CM | POA: Diagnosis not present

## 2023-07-30 DIAGNOSIS — R296 Repeated falls: Secondary | ICD-10-CM | POA: Diagnosis not present

## 2023-07-30 DIAGNOSIS — R41841 Cognitive communication deficit: Secondary | ICD-10-CM | POA: Diagnosis not present

## 2023-07-30 DIAGNOSIS — R29898 Other symptoms and signs involving the musculoskeletal system: Secondary | ICD-10-CM | POA: Diagnosis not present

## 2023-08-04 DIAGNOSIS — R41841 Cognitive communication deficit: Secondary | ICD-10-CM | POA: Diagnosis not present

## 2023-08-04 DIAGNOSIS — R29898 Other symptoms and signs involving the musculoskeletal system: Secondary | ICD-10-CM | POA: Diagnosis not present

## 2023-08-04 DIAGNOSIS — R296 Repeated falls: Secondary | ICD-10-CM | POA: Diagnosis not present

## 2023-08-06 DIAGNOSIS — R296 Repeated falls: Secondary | ICD-10-CM | POA: Diagnosis not present

## 2023-08-06 DIAGNOSIS — R29898 Other symptoms and signs involving the musculoskeletal system: Secondary | ICD-10-CM | POA: Diagnosis not present

## 2023-08-06 DIAGNOSIS — R41841 Cognitive communication deficit: Secondary | ICD-10-CM | POA: Diagnosis not present

## 2023-08-07 DIAGNOSIS — R296 Repeated falls: Secondary | ICD-10-CM | POA: Diagnosis not present

## 2023-08-07 DIAGNOSIS — R29898 Other symptoms and signs involving the musculoskeletal system: Secondary | ICD-10-CM | POA: Diagnosis not present

## 2023-08-07 DIAGNOSIS — R41841 Cognitive communication deficit: Secondary | ICD-10-CM | POA: Diagnosis not present

## 2023-08-10 DIAGNOSIS — R41841 Cognitive communication deficit: Secondary | ICD-10-CM | POA: Diagnosis not present

## 2023-08-10 DIAGNOSIS — R29898 Other symptoms and signs involving the musculoskeletal system: Secondary | ICD-10-CM | POA: Diagnosis not present

## 2023-08-10 DIAGNOSIS — R296 Repeated falls: Secondary | ICD-10-CM | POA: Diagnosis not present

## 2023-08-11 DIAGNOSIS — R296 Repeated falls: Secondary | ICD-10-CM | POA: Diagnosis not present

## 2023-08-11 DIAGNOSIS — R29898 Other symptoms and signs involving the musculoskeletal system: Secondary | ICD-10-CM | POA: Diagnosis not present

## 2023-08-11 DIAGNOSIS — R41841 Cognitive communication deficit: Secondary | ICD-10-CM | POA: Diagnosis not present

## 2023-08-13 DIAGNOSIS — R296 Repeated falls: Secondary | ICD-10-CM | POA: Diagnosis not present

## 2023-08-13 DIAGNOSIS — R29898 Other symptoms and signs involving the musculoskeletal system: Secondary | ICD-10-CM | POA: Diagnosis not present

## 2023-08-13 DIAGNOSIS — R41841 Cognitive communication deficit: Secondary | ICD-10-CM | POA: Diagnosis not present

## 2023-08-14 DIAGNOSIS — R41841 Cognitive communication deficit: Secondary | ICD-10-CM | POA: Diagnosis not present

## 2023-08-14 DIAGNOSIS — R296 Repeated falls: Secondary | ICD-10-CM | POA: Diagnosis not present

## 2023-08-14 DIAGNOSIS — R29898 Other symptoms and signs involving the musculoskeletal system: Secondary | ICD-10-CM | POA: Diagnosis not present

## 2023-08-17 DIAGNOSIS — R41841 Cognitive communication deficit: Secondary | ICD-10-CM | POA: Diagnosis not present

## 2023-08-17 DIAGNOSIS — R296 Repeated falls: Secondary | ICD-10-CM | POA: Diagnosis not present

## 2023-08-17 DIAGNOSIS — R29898 Other symptoms and signs involving the musculoskeletal system: Secondary | ICD-10-CM | POA: Diagnosis not present

## 2023-08-18 DIAGNOSIS — R41841 Cognitive communication deficit: Secondary | ICD-10-CM | POA: Diagnosis not present

## 2023-08-18 DIAGNOSIS — R296 Repeated falls: Secondary | ICD-10-CM | POA: Diagnosis not present

## 2023-08-18 DIAGNOSIS — R29898 Other symptoms and signs involving the musculoskeletal system: Secondary | ICD-10-CM | POA: Diagnosis not present

## 2023-08-20 DIAGNOSIS — R296 Repeated falls: Secondary | ICD-10-CM | POA: Diagnosis not present

## 2023-08-20 DIAGNOSIS — R41841 Cognitive communication deficit: Secondary | ICD-10-CM | POA: Diagnosis not present

## 2023-08-20 DIAGNOSIS — R29898 Other symptoms and signs involving the musculoskeletal system: Secondary | ICD-10-CM | POA: Diagnosis not present

## 2023-08-24 DIAGNOSIS — R41841 Cognitive communication deficit: Secondary | ICD-10-CM | POA: Diagnosis not present

## 2023-08-24 DIAGNOSIS — R296 Repeated falls: Secondary | ICD-10-CM | POA: Diagnosis not present

## 2023-08-24 DIAGNOSIS — R29898 Other symptoms and signs involving the musculoskeletal system: Secondary | ICD-10-CM | POA: Diagnosis not present

## 2023-08-25 DIAGNOSIS — R41841 Cognitive communication deficit: Secondary | ICD-10-CM | POA: Diagnosis not present

## 2023-08-25 DIAGNOSIS — R29898 Other symptoms and signs involving the musculoskeletal system: Secondary | ICD-10-CM | POA: Diagnosis not present

## 2023-08-25 DIAGNOSIS — R296 Repeated falls: Secondary | ICD-10-CM | POA: Diagnosis not present

## 2023-08-26 DIAGNOSIS — R296 Repeated falls: Secondary | ICD-10-CM | POA: Diagnosis not present

## 2023-08-26 DIAGNOSIS — R41841 Cognitive communication deficit: Secondary | ICD-10-CM | POA: Diagnosis not present

## 2023-08-26 DIAGNOSIS — R29898 Other symptoms and signs involving the musculoskeletal system: Secondary | ICD-10-CM | POA: Diagnosis not present

## 2023-08-27 DIAGNOSIS — R29898 Other symptoms and signs involving the musculoskeletal system: Secondary | ICD-10-CM | POA: Diagnosis not present

## 2023-08-27 DIAGNOSIS — R41841 Cognitive communication deficit: Secondary | ICD-10-CM | POA: Diagnosis not present

## 2023-08-27 DIAGNOSIS — R296 Repeated falls: Secondary | ICD-10-CM | POA: Diagnosis not present

## 2023-09-01 DIAGNOSIS — R296 Repeated falls: Secondary | ICD-10-CM | POA: Diagnosis not present

## 2023-09-01 DIAGNOSIS — R41841 Cognitive communication deficit: Secondary | ICD-10-CM | POA: Diagnosis not present

## 2023-09-01 DIAGNOSIS — R29898 Other symptoms and signs involving the musculoskeletal system: Secondary | ICD-10-CM | POA: Diagnosis not present

## 2023-09-03 DIAGNOSIS — R41841 Cognitive communication deficit: Secondary | ICD-10-CM | POA: Diagnosis not present

## 2023-09-03 DIAGNOSIS — R296 Repeated falls: Secondary | ICD-10-CM | POA: Diagnosis not present

## 2023-09-03 DIAGNOSIS — R29898 Other symptoms and signs involving the musculoskeletal system: Secondary | ICD-10-CM | POA: Diagnosis not present

## 2023-09-07 DIAGNOSIS — R29898 Other symptoms and signs involving the musculoskeletal system: Secondary | ICD-10-CM | POA: Diagnosis not present

## 2023-09-07 DIAGNOSIS — R41841 Cognitive communication deficit: Secondary | ICD-10-CM | POA: Diagnosis not present

## 2023-09-07 DIAGNOSIS — R296 Repeated falls: Secondary | ICD-10-CM | POA: Diagnosis not present

## 2023-09-08 DIAGNOSIS — G40909 Epilepsy, unspecified, not intractable, without status epilepticus: Secondary | ICD-10-CM | POA: Diagnosis not present

## 2023-09-08 DIAGNOSIS — R296 Repeated falls: Secondary | ICD-10-CM | POA: Diagnosis not present

## 2023-09-08 DIAGNOSIS — R29898 Other symptoms and signs involving the musculoskeletal system: Secondary | ICD-10-CM | POA: Diagnosis not present

## 2023-09-08 DIAGNOSIS — R41841 Cognitive communication deficit: Secondary | ICD-10-CM | POA: Diagnosis not present

## 2023-09-08 DIAGNOSIS — M818 Other osteoporosis without current pathological fracture: Secondary | ICD-10-CM | POA: Diagnosis not present

## 2023-09-08 DIAGNOSIS — D649 Anemia, unspecified: Secondary | ICD-10-CM | POA: Diagnosis not present

## 2023-09-10 DIAGNOSIS — R29898 Other symptoms and signs involving the musculoskeletal system: Secondary | ICD-10-CM | POA: Diagnosis not present

## 2023-09-10 DIAGNOSIS — R296 Repeated falls: Secondary | ICD-10-CM | POA: Diagnosis not present

## 2023-09-10 DIAGNOSIS — R41841 Cognitive communication deficit: Secondary | ICD-10-CM | POA: Diagnosis not present

## 2023-09-12 LAB — HEPATIC FUNCTION PANEL
ALT: 17 U/L (ref 7–35)
AST: 20 (ref 13–35)

## 2023-09-12 LAB — CBC: RBC: 3.63 — AB (ref 3.87–5.11)

## 2023-09-12 LAB — CBC AND DIFFERENTIAL
HCT: 37 (ref 36–46)
Hemoglobin: 12.2 (ref 12.0–16.0)
Platelets: 206 K/uL (ref 150–400)
WBC: 6.6

## 2023-09-12 LAB — BASIC METABOLIC PANEL WITH GFR
BUN: 9 (ref 4–21)
CO2: 28 — AB (ref 13–22)
Chloride: 99 (ref 99–108)
Creatinine: 0.5 (ref 0.5–1.1)
Potassium: 4.1 meq/L (ref 3.5–5.1)
Sodium: 134 — AB (ref 137–147)

## 2023-09-12 LAB — COMPREHENSIVE METABOLIC PANEL WITH GFR
Calcium: 8.8 (ref 8.7–10.7)
eGFR: 93

## 2023-09-15 DIAGNOSIS — R29898 Other symptoms and signs involving the musculoskeletal system: Secondary | ICD-10-CM | POA: Diagnosis not present

## 2023-09-15 DIAGNOSIS — R296 Repeated falls: Secondary | ICD-10-CM | POA: Diagnosis not present

## 2023-09-15 DIAGNOSIS — R41841 Cognitive communication deficit: Secondary | ICD-10-CM | POA: Diagnosis not present

## 2023-09-17 DIAGNOSIS — R41841 Cognitive communication deficit: Secondary | ICD-10-CM | POA: Diagnosis not present

## 2023-09-17 DIAGNOSIS — R29898 Other symptoms and signs involving the musculoskeletal system: Secondary | ICD-10-CM | POA: Diagnosis not present

## 2023-09-17 DIAGNOSIS — R296 Repeated falls: Secondary | ICD-10-CM | POA: Diagnosis not present

## 2023-09-18 DIAGNOSIS — R29898 Other symptoms and signs involving the musculoskeletal system: Secondary | ICD-10-CM | POA: Diagnosis not present

## 2023-09-18 DIAGNOSIS — R296 Repeated falls: Secondary | ICD-10-CM | POA: Diagnosis not present

## 2023-09-18 DIAGNOSIS — R41841 Cognitive communication deficit: Secondary | ICD-10-CM | POA: Diagnosis not present

## 2023-09-21 NOTE — Progress Notes (Shared)
 [facility] - Clinic Note  10/05/2023     CHIEF COMPLAINT Patient presents for No chief complaint on file.  HISTORY OF PRESENT ILLNESS: Erin Ochoa is a 83 y.o. female who presents to the clinic today for:     Pt states she is using Soothe XP TID OU and Systane gel at night, she states she can't tell any difference in her vision  Referring physician: Cleatus Collar, MD [address] [place],  [place] [address]  HISTORICAL INFORMATION:   Selected notes from the MEDICAL RECORD NUMBER Referred by Dr. Cleatus for progressing ARMD  LEE:  Ocular Hx- PMH-    CURRENT MEDICATIONS: Current Outpatient Medications (Ophthalmic Drugs)  Medication Sig   Artificial Tear Solution (SOOTHE XP) SOLN Place 1 drop into both eyes 3 (three) times daily.   Polyethyl Glycol-Propyl Glycol (SYSTANE) 0.4-0.3 % GEL ophthalmic gel Place 1 Application into both eyes at bedtime. (Patient not taking: Reported on 06/15/2023)   No current facility-administered medications for this visit. (Ophthalmic Drugs)   Current Outpatient Medications (Other)  Medication Sig   acetaminophen  (TYLENOL ) 500 MG tablet Take 2 tablets (1,000 mg total) by mouth every 8 (eight) hours as needed (for pain).   atorvastatin  (LIPITOR) 10 MG tablet TAKE 1/2 TABLET DAILY   Calcium  Carbonate-Vit D-Min (CALTRATE 600+D PLUS MINERALS) 600-800 MG-UNIT CHEW Chew 1 each by mouth 2 (two) times daily.   clonazePAM  (KLONOPIN ) 1 MG tablet Take 1.5 tablets (1.5 mg total) by mouth at bedtime.   HYDROcodone -acetaminophen  (NORCO/VICODIN) 5-325 MG tablet Take 1 tablet by mouth every 8 (eight) hours as needed.   lidocaine  4 % Place 1 patch onto the skin daily. (Patient not taking: Reported on 06/15/2023)   Multiple Vitamins-Minerals (PRESERVISION AREDS 2) CAPS Take 1 capsule by mouth in the morning and at bedtime.   Nutritional Supplements (ENSURE MAX PROTEIN PO) Take 1 Bottle by mouth See admin instructions. Drink 1 bottle by  mouth once a day, alternating with one bottle of Ensure original formula every other day   Nutritional Supplements (ENSURE ORIGINAL) LIQD Take 1 Bottle by mouth See admin instructions. Drink 1 bottle by mouth every other day- alternating with one bottle of Ensure High Protein formula every other day (Patient not taking: Reported on 06/15/2023)   polyethylene glycol powder (GLYCOLAX /MIRALAX ) 17 GM/SCOOP powder Take 17 g by mouth daily.   TEGRETOL  200 MG tablet TAKE 1 AND 1/2 TABLETS BY MOUTH AT BEDTIME   Vitamin D , Ergocalciferol , 50000 units CAPS TAKE ONE CAPSULE BY MOUTH ONCE A WEEK   No current facility-administered medications for this visit. (Other)   REVIEW OF SYSTEMS:    ALLERGIES Allergies  Allergen Reactions   Carbamazepine  Other (See Comments)    HAS TO TAKE NAME BRAND ONLY TEGRETOL  OR A SEIZURE OCCURS   Other Other (See Comments)    NO FOODS WITH Seeds -- Reflux   Cat Dander Swelling and Other (See Comments)    Patient states that it causes her eyes to swell shut   Cefaclor Other (See Comments)    Unknown reaction   Codeine Nausea Only   Fish Allergy Nausea Only   Iodinated Contrast Media Other (See Comments)    Unknown reaction     Penicillins Itching   Percocet [Oxycodone -Acetaminophen ] Nausea And Vomiting   Red Dye #40 (Allura Red) Other (See Comments)    Reflux   Senna-Docusate Sodium  [Sennosides-Docusate Sodium ] Other (See Comments)    Severe stomach pains   Shellfish-Derived Products Nausea Only  Shrimp (Diagnostic) Nausea Only    Stomach pain    Tomato Other (See Comments)    Reflux   Hm Lidocaine  Patch [Lidocaine ] Rash    Says that this was related to putting a heating pad on the area with the lidocaine  patch, has used lidocaine  patches in other areas without a reaction   PAST MEDICAL HISTORY Past Medical History:  Diagnosis Date   Anxiety    Cataract    Chronic constipation    Depression    Osteoporosis    Seizures ([name])    epilepsy  last  seizure 1977   Past Surgical History:  Procedure Laterality Date   ABDOMINAL HYSTERECTOMY  1995   Dr. Fleeta Coup   CATARACT EXTRACTION Bilateral 12/2016   COLONOSCOPY  2012   patchy increased intraepithelial lymphocytes - draelos   ESOPHAGOGASTRODUODENOSCOPY     multiple   FISSURECTOMY     INTRAMEDULLARY (IM) NAIL INTERTROCHANTERIC Left 05/09/2021   Procedure: INTRAMEDULLARY (IM) NAIL INTERTROCHANTRIC;  Surgeon: Addie Cordella Hamilton, MD;  Location: [facility] OR;  Service: Orthopedics;  Laterality: Left;   IR KYPHO EA ADDL LEVEL THORACIC OR LUMBAR  06/26/2021   IR KYPHO THORACIC WITH BONE BIOPSY  06/26/2021   WRIST SURGERY Left    due to fracture   FAMILY HISTORY Family History  Problem Relation Age of Onset   Alcohol  abuse Father    Diabetes Father    Diabetes Maternal Grandmother    Diabetes Paternal Grandmother    Colon cancer Maternal Grandfather    Esophageal cancer Neg Hx    Rectal cancer Neg Hx    Stomach cancer Neg Hx    SOCIAL HISTORY Social History   Tobacco Use   Smoking status: Former    Current packs/day: 0.00    Average packs/day: 1 pack/day for 25.0 years (25.0 ttl pk-yrs)    Types: Cigarettes    Start date: 02/18/1956    Quit date: 02/17/1981    Years since quitting: 42.6   Smokeless tobacco: Never  Vaping Use   Vaping status: Never Used  Substance Use Topics   Alcohol  use: No    Alcohol /week: 0.0 standard drinks of alcohol    Drug use: No       OPHTHALMIC EXAM: Not recorded    IMAGING AND PROCEDURES  Imaging and Procedures for 10/05/2023          ASSESSMENT/PLAN:    ICD-10-CM   1. Intermediate stage nonexudative age-related macular degeneration of both eyes  H35.3132     2. Pseudophakia, both eyes  Z96.1     3. Dry eyes  H04.123       1. Age related macular degeneration, non-exudative, both eyes  - intermediate stage with prominent PEDs OU (OS > OD) -- stable  - OCT without significant change from prior  - Recommend amsler grid  monitoring  - f/u 9 months, DFE, OCT  2. Pseudophakia OU  - s/p CE/IOL (Dr. Cleatus)  - IOLs in good position, doing well  - monitor  3. Dry eyes OU - recommend artificial tears and lubricating ointment as needed -- pt is using Soothe XP TID and Systane / Genteal gel QHS OU - referred to Dr. Gust for evaluation  Ophthalmic Meds Ordered this visit:  No orders of the defined types were placed in this encounter.    No follow-ups on file.  There are no Patient Instructions on file for this visit.  Explained the diagnoses, plan, and follow up with the patient and they expressed  understanding.  Patient expressed understanding of the importance of proper follow up care.   This document serves as a record of services personally performed by Redell JUDITHANN Hans, MD, PhD. It was created on their behalf by Avelina Pereyra, COA an ophthalmic technician. The creation of this record is the provider's dictation and/or activities during the visit.   Electronically signed by: Avelina GORMAN Pereyra, COT  09/21/23  10:53 AM     Redell JUDITHANN Hans, M.D., Ph.D. Diseases & Surgery of the Retina and Vitreous [facility]     Abbreviations: M myopia (nearsighted); A astigmatism; H hyperopia (farsighted); P presbyopia; Mrx spectacle prescription;  CTL contact lenses; OD right eye; OS left eye; OU both eyes  XT exotropia; ET esotropia; PEK punctate epithelial keratitis; PEE punctate epithelial erosions; DES dry eye syndrome; MGD meibomian gland dysfunction; ATs artificial tears; PFAT's preservative free artificial tears; NSC nuclear sclerotic cataract; PSC posterior subcapsular cataract; ERM epi-retinal membrane; PVD posterior vitreous detachment; RD retinal detachment; DM diabetes mellitus; DR diabetic retinopathy; NPDR non-proliferative diabetic retinopathy; PDR proliferative diabetic retinopathy; CSME clinically significant macular edema; DME diabetic macular edema; dbh dot blot hemorrhages; CWS  cotton wool spot; POAG primary open angle glaucoma; C/D cup-to-disc ratio; HVF humphrey visual field; GVF goldmann visual field; OCT optical coherence tomography; IOP intraocular pressure; BRVO Branch retinal vein occlusion; CRVO central retinal vein occlusion; CRAO central retinal artery occlusion; BRAO branch retinal artery occlusion; RT retinal tear; SB scleral buckle; PPV pars plana vitrectomy; VH Vitreous hemorrhage; PRP panretinal laser photocoagulation; IVK intravitreal kenalog; VMT vitreomacular traction; MH Macular hole;  NVD neovascularization of the disc; NVE neovascularization elsewhere; AREDS age related eye disease study; ARMD age related macular degeneration; POAG primary open angle glaucoma; EBMD epithelial/anterior basement membrane dystrophy; ACIOL anterior chamber intraocular lens; IOL intraocular lens; PCIOL posterior chamber intraocular lens; Phaco/IOL phacoemulsification with intraocular lens placement; PRK photorefractive keratectomy; LASIK laser assisted in situ keratomileusis; HTN hypertension; DM diabetes mellitus; COPD chronic obstructive pulmonary disease

## 2023-09-22 DIAGNOSIS — R296 Repeated falls: Secondary | ICD-10-CM | POA: Diagnosis not present

## 2023-09-22 DIAGNOSIS — R29898 Other symptoms and signs involving the musculoskeletal system: Secondary | ICD-10-CM | POA: Diagnosis not present

## 2023-09-22 DIAGNOSIS — R41841 Cognitive communication deficit: Secondary | ICD-10-CM | POA: Diagnosis not present

## 2023-09-24 DIAGNOSIS — R29898 Other symptoms and signs involving the musculoskeletal system: Secondary | ICD-10-CM | POA: Diagnosis not present

## 2023-09-24 DIAGNOSIS — R296 Repeated falls: Secondary | ICD-10-CM | POA: Diagnosis not present

## 2023-09-24 DIAGNOSIS — R41841 Cognitive communication deficit: Secondary | ICD-10-CM | POA: Diagnosis not present

## 2023-09-29 DIAGNOSIS — R29898 Other symptoms and signs involving the musculoskeletal system: Secondary | ICD-10-CM | POA: Diagnosis not present

## 2023-09-29 DIAGNOSIS — R41841 Cognitive communication deficit: Secondary | ICD-10-CM | POA: Diagnosis not present

## 2023-09-29 DIAGNOSIS — R296 Repeated falls: Secondary | ICD-10-CM | POA: Diagnosis not present

## 2023-09-30 DIAGNOSIS — R41841 Cognitive communication deficit: Secondary | ICD-10-CM | POA: Diagnosis not present

## 2023-09-30 DIAGNOSIS — R296 Repeated falls: Secondary | ICD-10-CM | POA: Diagnosis not present

## 2023-09-30 DIAGNOSIS — R29898 Other symptoms and signs involving the musculoskeletal system: Secondary | ICD-10-CM | POA: Diagnosis not present

## 2023-10-05 ENCOUNTER — Encounter (INDEPENDENT_AMBULATORY_CARE_PROVIDER_SITE_OTHER): Payer: Medicare Other | Admitting: Ophthalmology

## 2023-10-05 DIAGNOSIS — Z961 Presence of intraocular lens: Secondary | ICD-10-CM

## 2023-10-05 DIAGNOSIS — H353132 Nonexudative age-related macular degeneration, bilateral, intermediate dry stage: Secondary | ICD-10-CM

## 2023-10-05 DIAGNOSIS — H04123 Dry eye syndrome of bilateral lacrimal glands: Secondary | ICD-10-CM

## 2023-10-05 IMAGING — DX DG PORTABLE PELVIS
1 series · 1 of 1 positions shown · non-contrast
Comparison: Films from earlier in the same day.

CLINICAL DATA: Status post left femoral nail placement

EXAM:
PORTABLE PELVIS 1 VIEW

[pelvis ap]
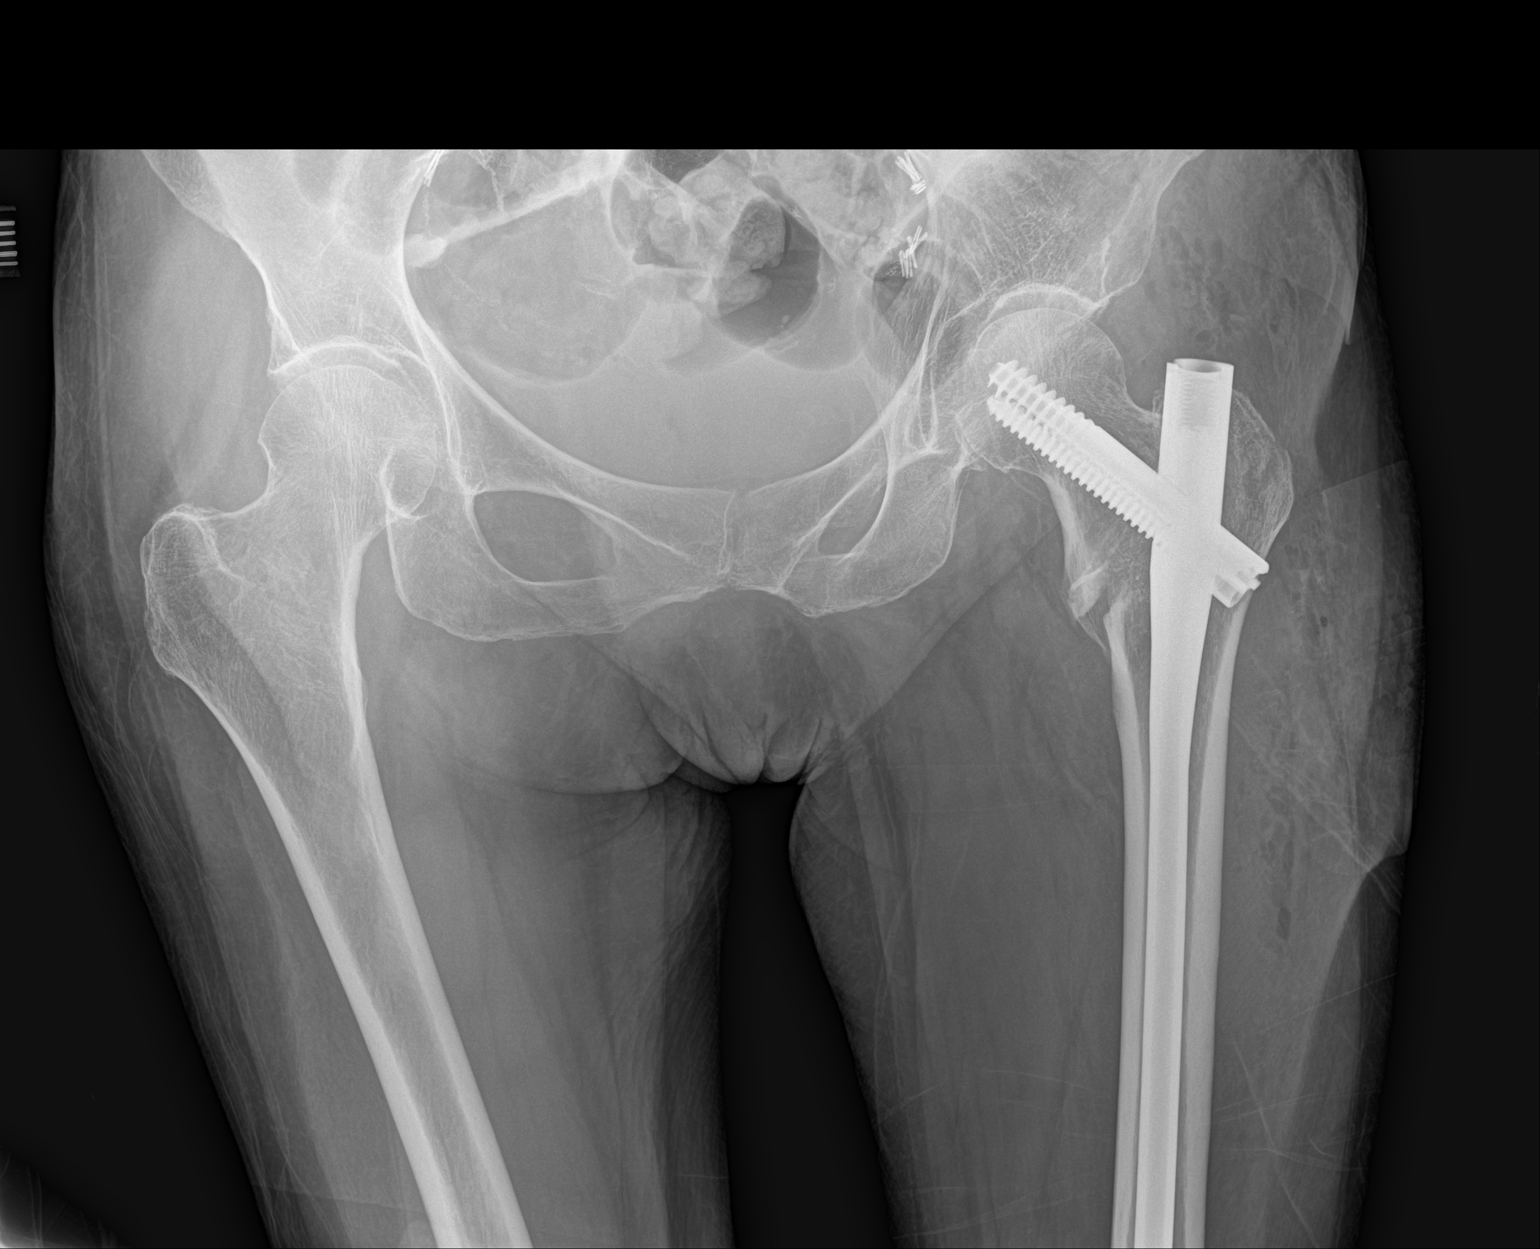

[1 of 1 positions shown; findings below may reference images not displayed]

FINDINGS: Postsurgical changes are noted in the left femur with near anatomic
alignment of the fracture fragments. No pelvic fracture is seen. No
other focal abnormality is noted.
IMPRESSION: Status post ORIF of proximal left femoral fracture.

## 2023-10-05 IMAGING — CR DG HIP (WITH OR WITHOUT PELVIS) 2-3V*L*
3 series · 3 of 3 positions shown · non-contrast
Comparison: None.

CLINICAL DATA: Fell.  Left hip pain.

EXAM:
DG HIP (WITH OR WITHOUT PELVIS) 2-3V LEFT

[x pelvis]
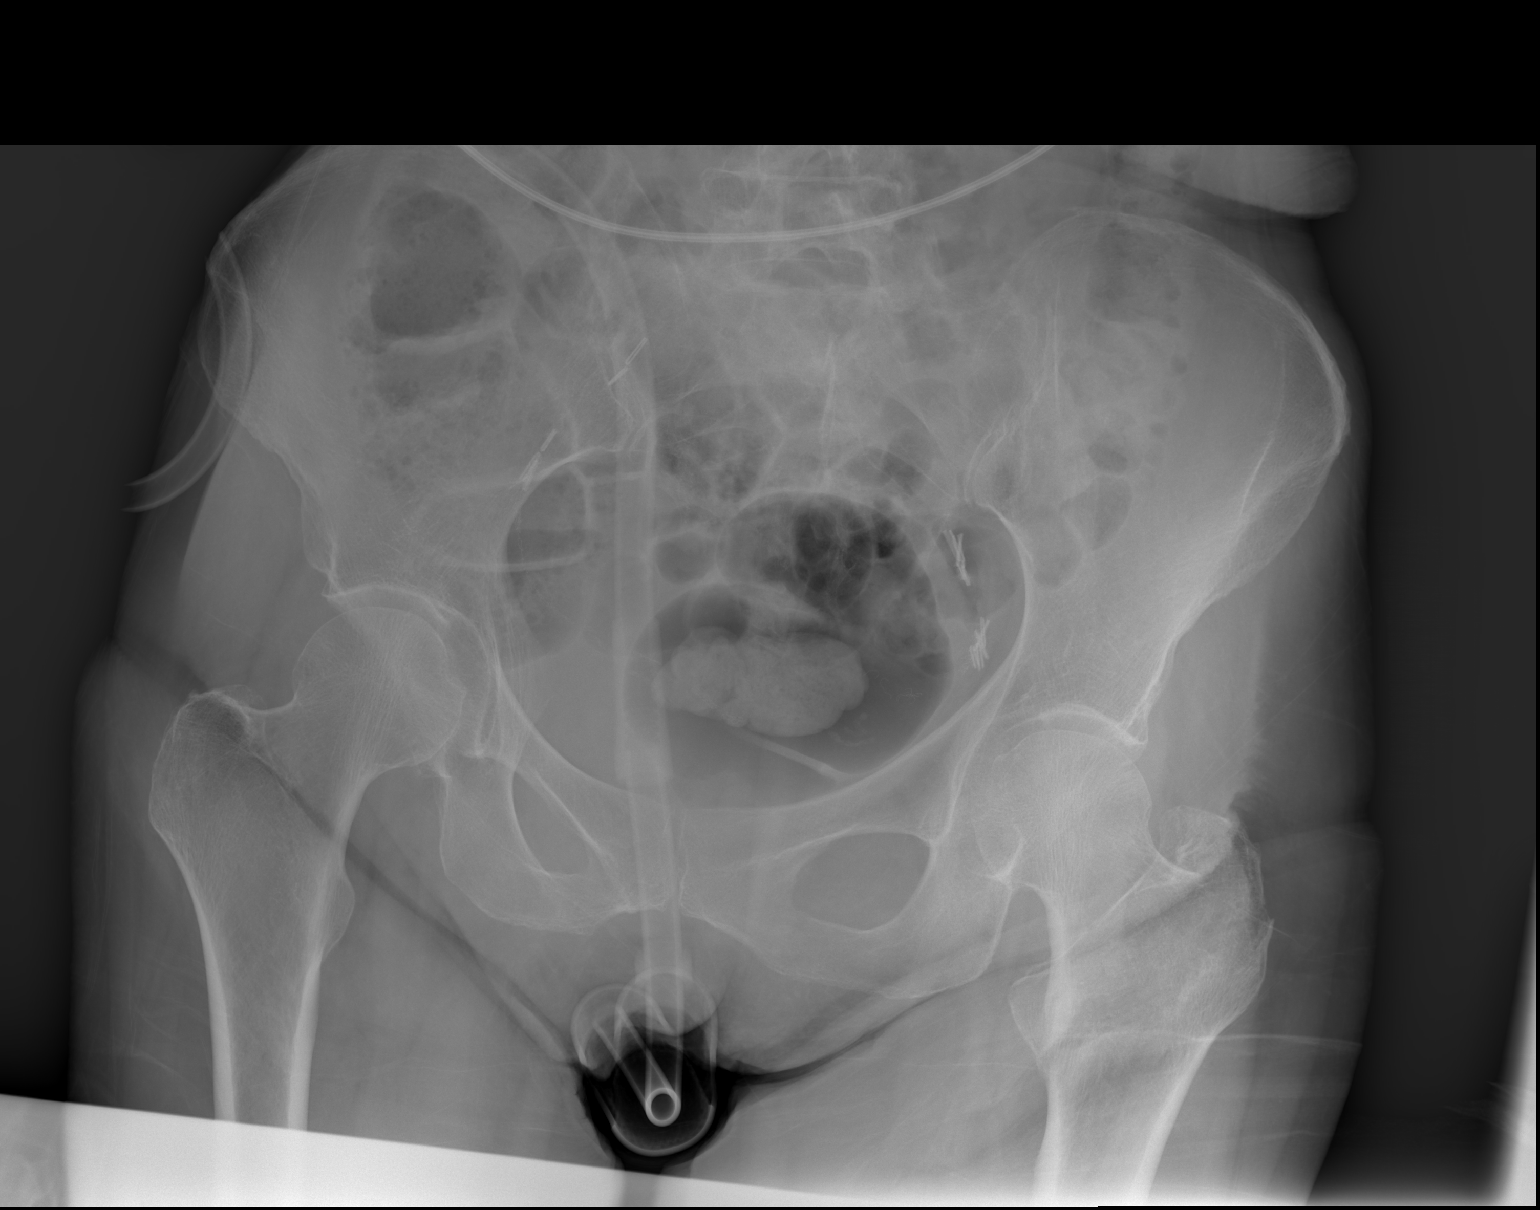

[x hip ap left]
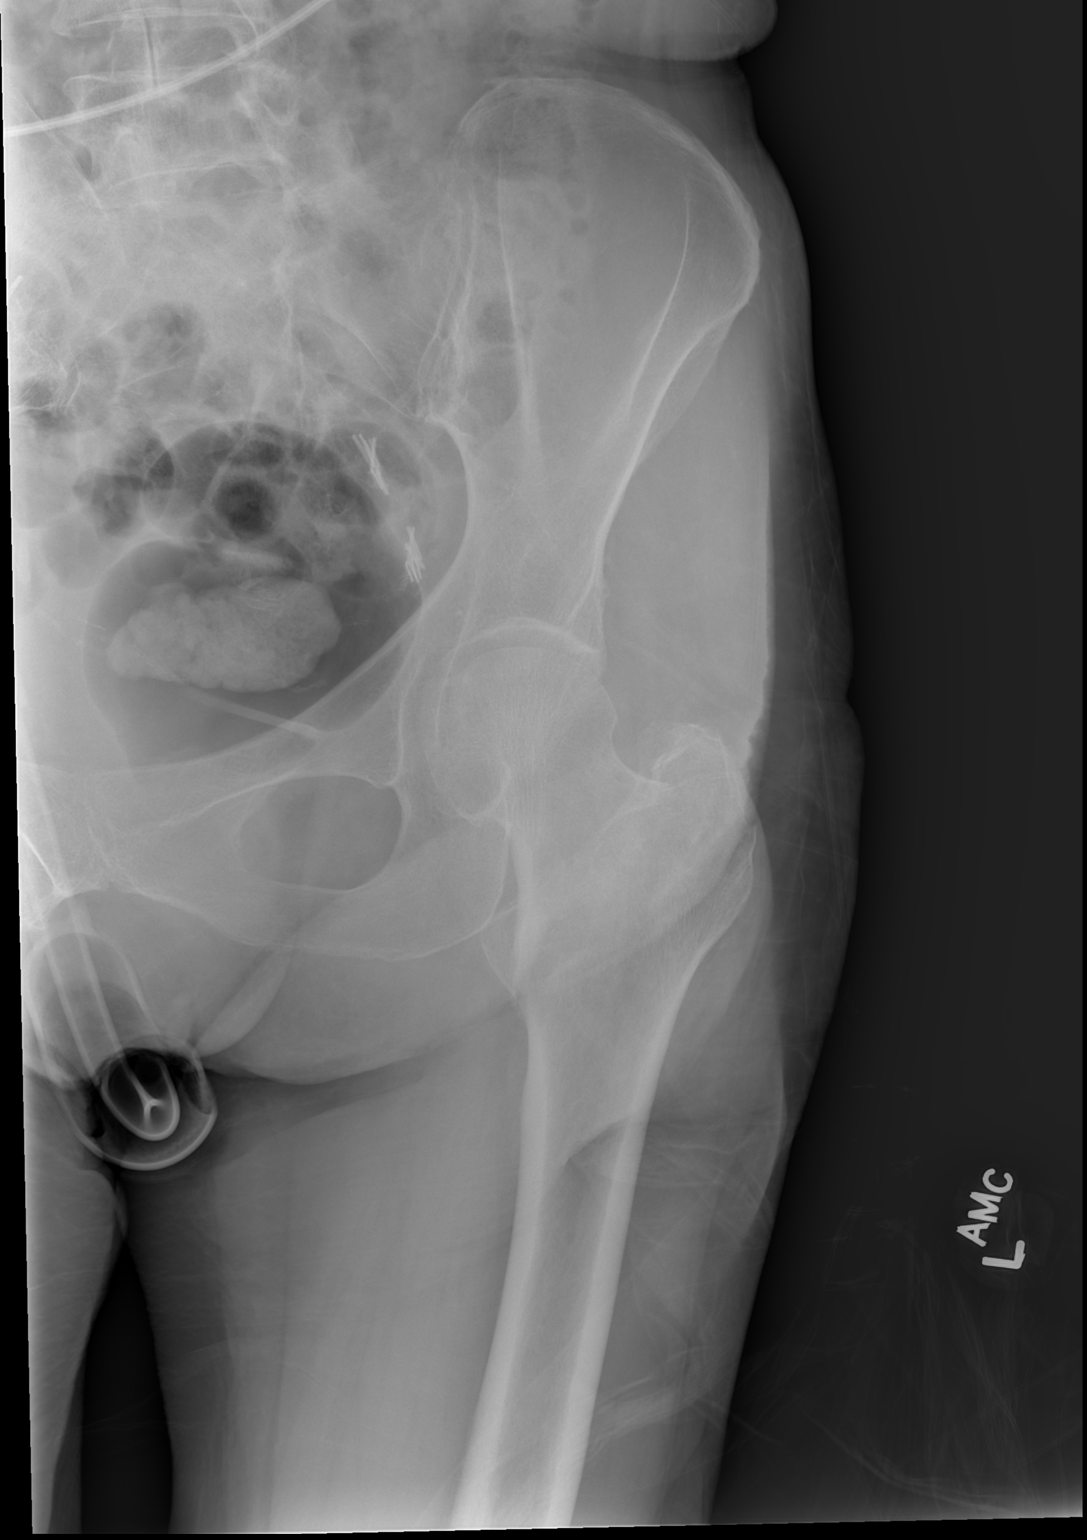

[w hip lat left]
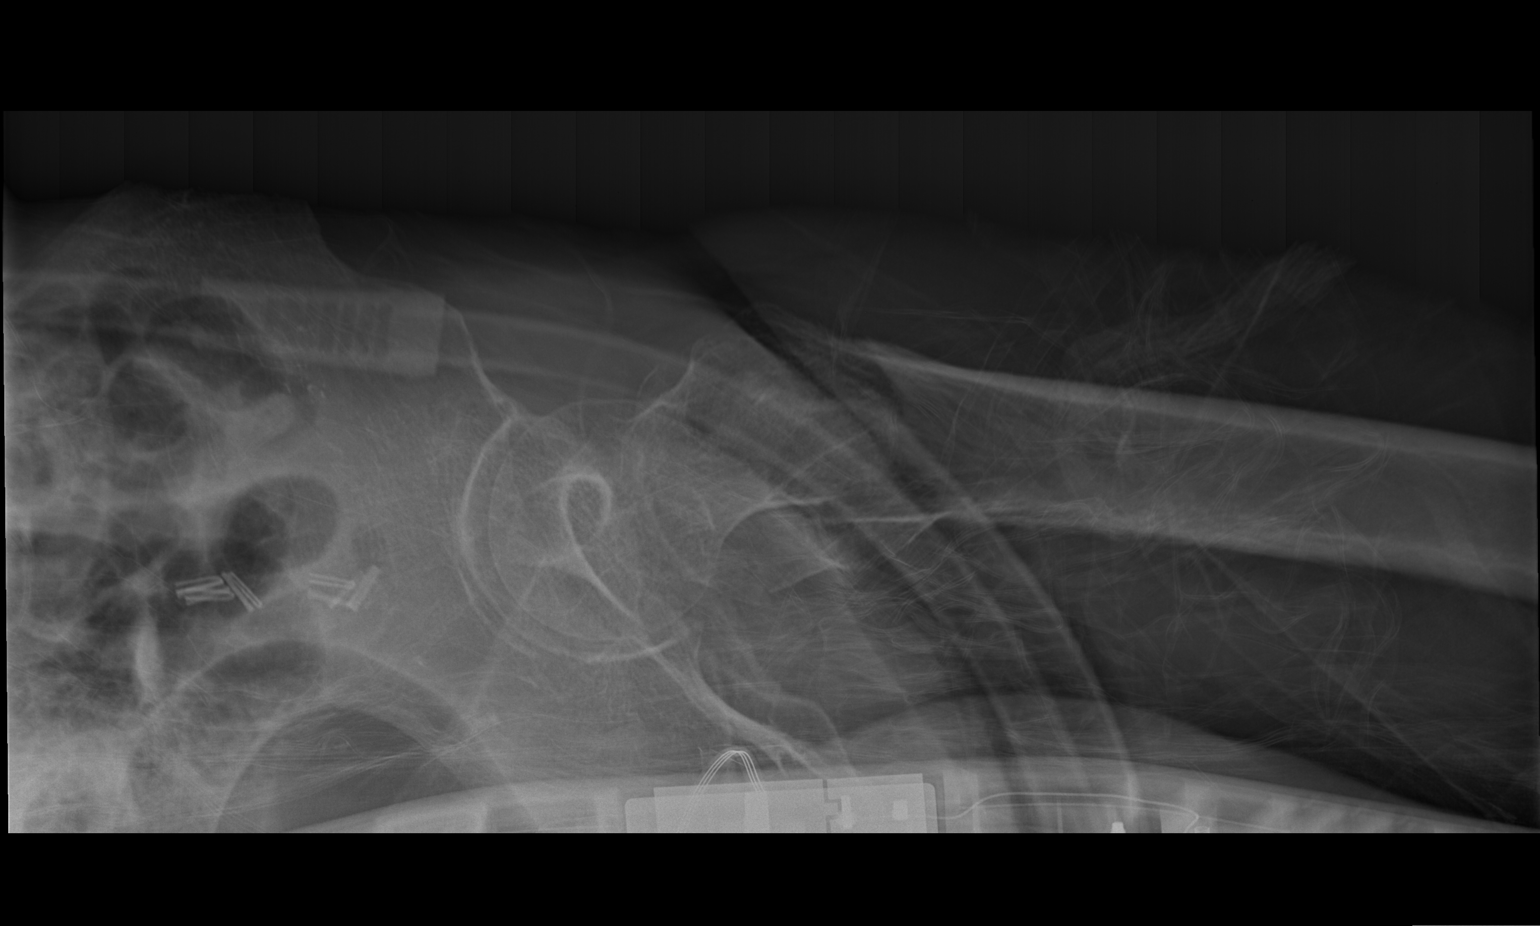

[3 of 3 positions shown; findings below may reference images not displayed]

FINDINGS: Acute nondisplaced intertrochanteric fracture of left femur. Femoral
neck intact. No other pelvic fracture seen.
IMPRESSION: Acute nondisplaced intertrochanteric fracture of the left femur.

## 2023-10-05 IMAGING — RF DG FEMUR 2+V*L*
1 series · 7 of 7 positions shown · non-contrast
Comparison: Films from earlier in the same day.

CLINICAL DATA: Left intratrochanteric fracture

EXAM:
LEFT FEMUR 2 VIEWS

[Series 1: run · 7 of 7 slices shown]
[im 1/7]
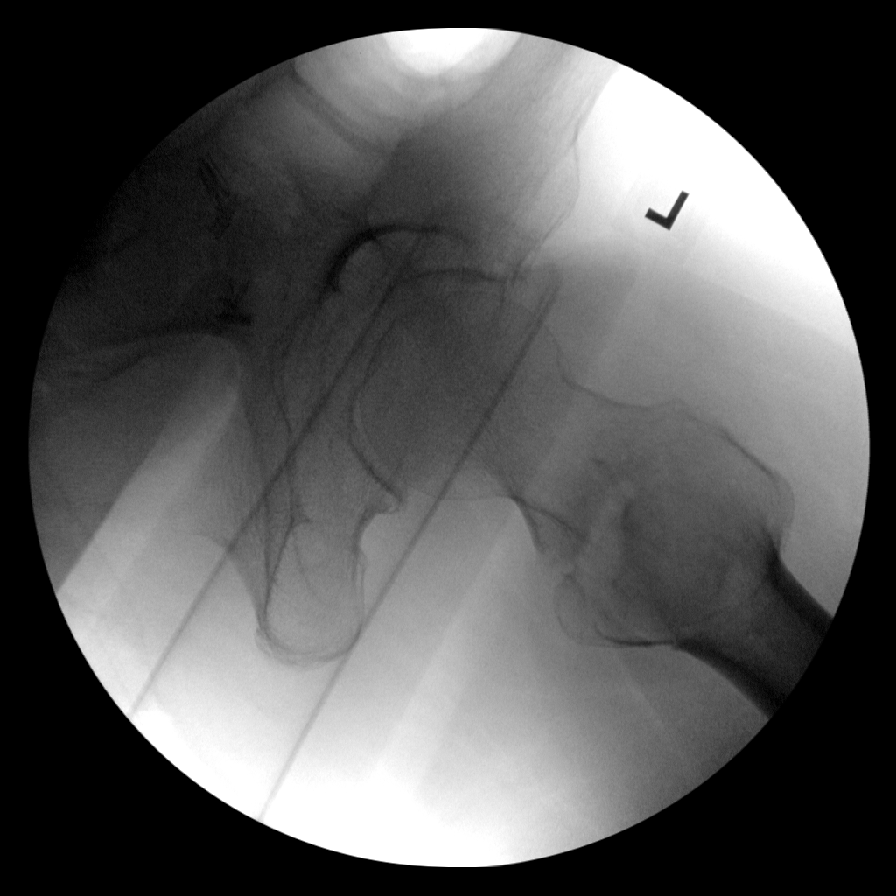
[im 2/7]
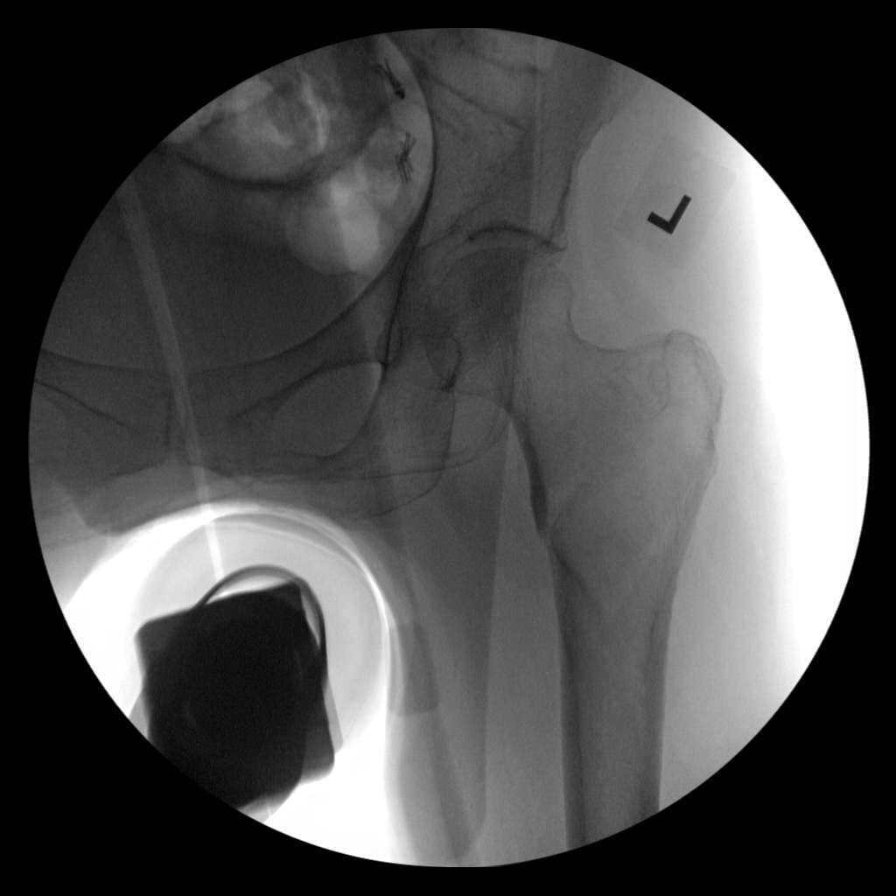
[im 3/7]
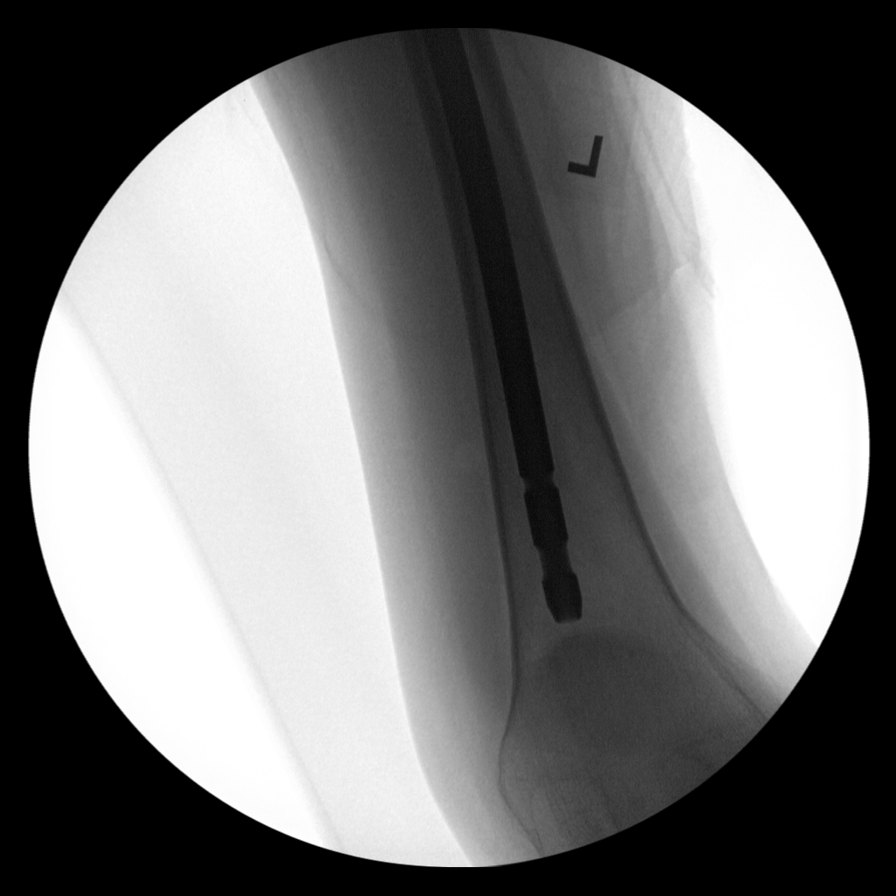
[im 4/7]
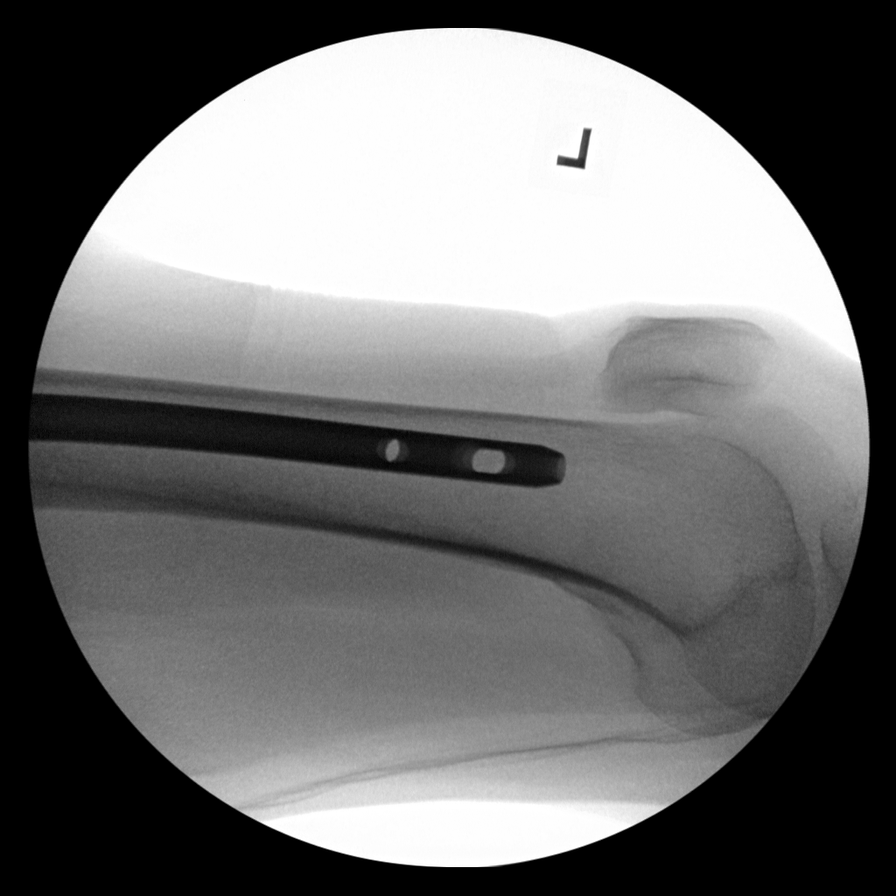
[im 5/7]
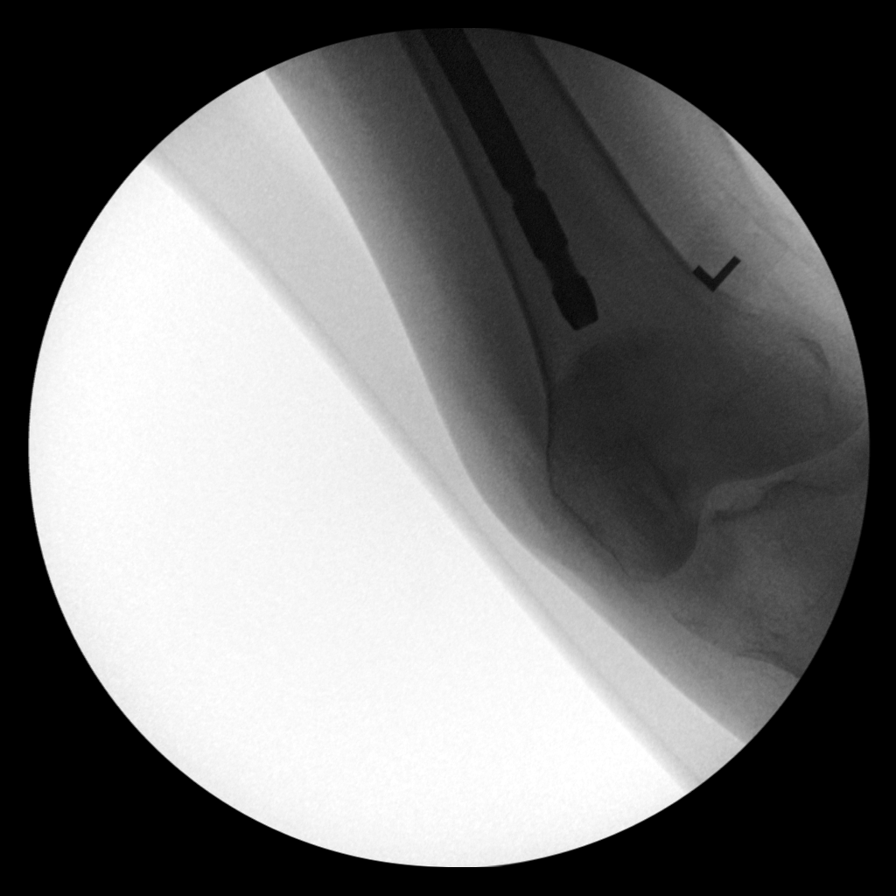
[im 6/7]
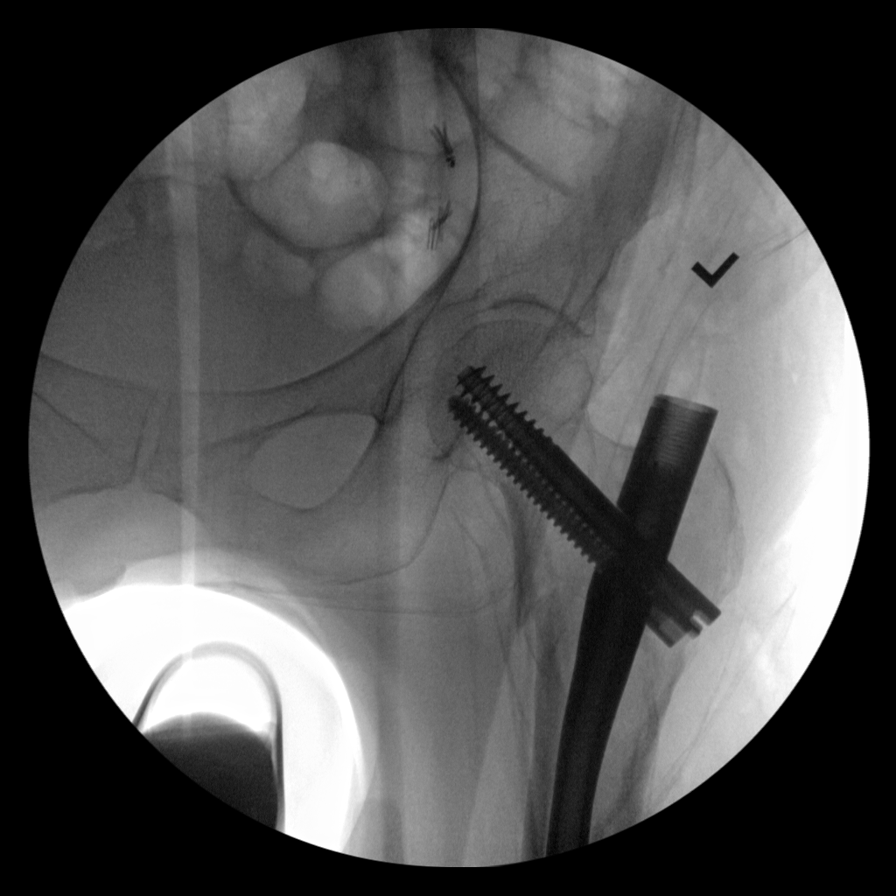
[im 7/7]
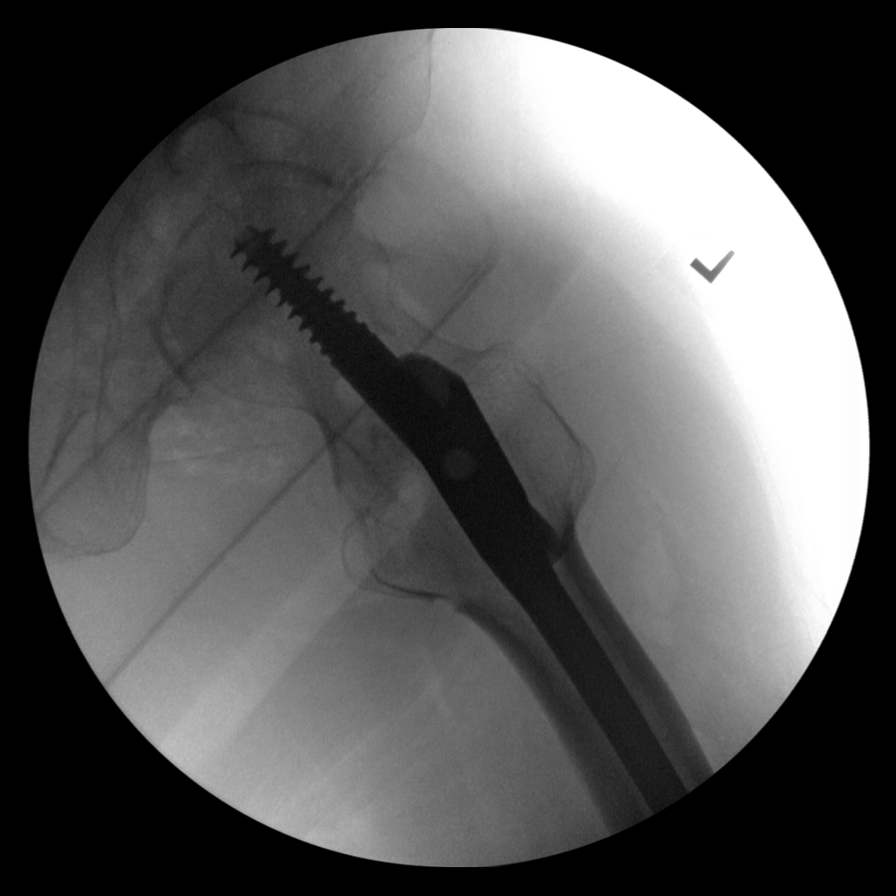

[7 of 7 positions shown; findings below may reference images not displayed]

FLUOROSCOPY TIME:  Radiation Exposure Index (as provided by the
fluoroscopic device): 6 mGy

If the device does not provide the exposure index:

Fluoroscopy Time:  1 minute 7 seconds

Number of Acquired Images:  7
FINDINGS: Initial images again demonstrate the left intratrochanteric fracture
with some reduction. Medullary rod is then seen in the left femur
with 2 fixation screws traversing the femoral neck. Fracture
fragments are in near anatomic alignment.
IMPRESSION: ORIF of left intratrochanteric femoral fracture.

## 2023-10-06 DIAGNOSIS — R41841 Cognitive communication deficit: Secondary | ICD-10-CM | POA: Diagnosis not present

## 2023-10-06 DIAGNOSIS — R296 Repeated falls: Secondary | ICD-10-CM | POA: Diagnosis not present

## 2023-10-06 DIAGNOSIS — R29898 Other symptoms and signs involving the musculoskeletal system: Secondary | ICD-10-CM | POA: Diagnosis not present

## 2023-10-08 NOTE — Progress Notes (Signed)
 [facility] - Clinic Note  10/14/2023     CHIEF COMPLAINT Patient presents for Retina Follow Up  HISTORY OF PRESENT ILLNESS: Erin Ochoa is a 83 y.o. female who presents to the clinic today for:   HPI     Retina Follow Up   Patient presents with  Dry AMD.  In both eyes.  This started 3 years ago.  Severity is moderate.  Duration of 9 months.  Since onset it is stable.  I, the attending physician,  performed the HPI with the patient and updated documentation appropriately.        Comments   Patient states vision has been a little more blurry and her glasses may need updating. Pt denies FOL/floaters/pain. Pt is ATS TID and Systane OU at bedtime.       Last edited by Valdemar Rogue, MD on 10/15/2023 12:15 AM.     Pt states she is taking the preservision but has been lacking on checking the Amsler grid. Earlier in the year she moved to an assisted living facility due to vertigo, seizures, and falls.  Referring physician: Cleatus Collar, MD [address] [place],  [place] [address]  HISTORICAL INFORMATION:   Selected notes from the MEDICAL RECORD NUMBER Referred by Dr. Cleatus for progressing ARMD  LEE:  Ocular Hx- PMH-    CURRENT MEDICATIONS: Current Outpatient Medications (Ophthalmic Drugs)  Medication Sig   Artificial Tear Solution (SOOTHE XP) SOLN Place 1 drop into both eyes 3 (three) times daily.   Polyethyl Glycol-Propyl Glycol (SYSTANE) 0.4-0.3 % GEL ophthalmic gel Place 1 Application into both eyes at bedtime. (Patient not taking: Reported on 06/15/2023)   No current facility-administered medications for this visit. (Ophthalmic Drugs)   Current Outpatient Medications (Other)  Medication Sig   acetaminophen  (TYLENOL ) 500 MG tablet Take 2 tablets (1,000 mg total) by mouth every 8 (eight) hours as needed (for pain).   atorvastatin  (LIPITOR) 10 MG tablet TAKE 1/2 TABLET DAILY   Calcium  Carbonate-Vit D-Min (CALTRATE 600+D PLUS MINERALS)  600-800 MG-UNIT CHEW Chew 1 each by mouth 2 (two) times daily.   clonazePAM  (KLONOPIN ) 1 MG tablet Take 1.5 tablets (1.5 mg total) by mouth at bedtime.   HYDROcodone -acetaminophen  (NORCO/VICODIN) 5-325 MG tablet Take 1 tablet by mouth every 8 (eight) hours as needed.   lidocaine  4 % Place 1 patch onto the skin daily. (Patient not taking: Reported on 06/15/2023)   Multiple Vitamins-Minerals (PRESERVISION AREDS 2) CAPS Take 1 capsule by mouth in the morning and at bedtime.   Nutritional Supplements (ENSURE MAX PROTEIN PO) Take 1 Bottle by mouth See admin instructions. Drink 1 bottle by mouth once a day, alternating with one bottle of Ensure original formula every other day   Nutritional Supplements (ENSURE ORIGINAL) LIQD Take 1 Bottle by mouth See admin instructions. Drink 1 bottle by mouth every other day- alternating with one bottle of Ensure High Protein formula every other day (Patient not taking: Reported on 06/15/2023)   polyethylene glycol powder (GLYCOLAX /MIRALAX ) 17 GM/SCOOP powder Take 17 g by mouth daily.   TEGRETOL  200 MG tablet TAKE 1 AND 1/2 TABLETS BY MOUTH AT BEDTIME   Vitamin D , Ergocalciferol , 50000 units CAPS TAKE ONE CAPSULE BY MOUTH ONCE A WEEK   No current facility-administered medications for this visit. (Other)   REVIEW OF SYSTEMS: ROS   Positive for: Eyes Negative for: Constitutional, Gastrointestinal, Neurological, Skin, Genitourinary, Musculoskeletal, HENT, Endocrine, Cardiovascular, Respiratory, Psychiatric, Allergic/Imm, Heme/Lymph Last edited by Elnor Avelina RAMAN, COT on  10/14/2023 12:39 PM.       ALLERGIES Allergies  Allergen Reactions   Carbamazepine  Other (See Comments)    HAS TO TAKE NAME BRAND ONLY TEGRETOL  OR A SEIZURE OCCURS   Other Other (See Comments)    NO FOODS WITH Seeds -- Reflux   Cat Dander Swelling and Other (See Comments)    Patient states that it causes her eyes to swell shut   Cefaclor Other (See Comments)    Unknown reaction   Codeine  Nausea Only   Fish Allergy Nausea Only   Iodinated Contrast Media Other (See Comments)    Unknown reaction     Penicillins Itching   Percocet [Oxycodone -Acetaminophen ] Nausea And Vomiting   Red Dye #40 (Allura Red) Other (See Comments)    Reflux   Senna-Docusate Sodium  [Sennosides-Docusate Sodium ] Other (See Comments)    Severe stomach pains   Shellfish-Derived Products Nausea Only   Shrimp (Diagnostic) Nausea Only    Stomach pain    Tomato Other (See Comments)    Reflux   Hm Lidocaine  Patch [Lidocaine ] Rash    Says that this was related to putting a heating pad on the area with the lidocaine  patch, has used lidocaine  patches in other areas without a reaction   PAST MEDICAL HISTORY Past Medical History:  Diagnosis Date   Anxiety    Cataract    Chronic constipation    Depression    Osteoporosis    Seizures ([name])    epilepsy  last seizure 1977   Past Surgical History:  Procedure Laterality Date   ABDOMINAL HYSTERECTOMY  1995   Dr. Fleeta Coup   CATARACT EXTRACTION Bilateral 12/2016   COLONOSCOPY  2012   patchy increased intraepithelial lymphocytes - draelos   ESOPHAGOGASTRODUODENOSCOPY     multiple   FISSURECTOMY     INTRAMEDULLARY (IM) NAIL INTERTROCHANTERIC Left 05/09/2021   Procedure: INTRAMEDULLARY (IM) NAIL INTERTROCHANTRIC;  Surgeon: Addie Cordella Hamilton, MD;  Location: [facility] OR;  Service: Orthopedics;  Laterality: Left;   IR KYPHO EA ADDL LEVEL THORACIC OR LUMBAR  06/26/2021   IR KYPHO THORACIC WITH BONE BIOPSY  06/26/2021   WRIST SURGERY Left    due to fracture   FAMILY HISTORY Family History  Problem Relation Age of Onset   Alcohol  abuse Father    Diabetes Father    Diabetes Maternal Grandmother    Diabetes Paternal Grandmother    Colon cancer Maternal Grandfather    Esophageal cancer Neg Hx    Rectal cancer Neg Hx    Stomach cancer Neg Hx    SOCIAL HISTORY Social History   Tobacco Use   Smoking status: Former    Current packs/day: 0.00    Average  packs/day: 1 pack/day for 25.0 years (25.0 ttl pk-yrs)    Types: Cigarettes    Start date: 02/18/1956    Quit date: 02/17/1981    Years since quitting: 42.6   Smokeless tobacco: Never  Vaping Use   Vaping status: Never Used  Substance Use Topics   Alcohol  use: No    Alcohol /week: 0.0 standard drinks of alcohol    Drug use: No       OPHTHALMIC EXAM: Base Eye Exam     Visual Acuity (Snellen - Linear)       Right Left   Dist cc 20/25 -1 20/30 -2   Dist ph cc 20/20 -2 20/20 -2    Correction: Glasses         Tonometry (Tonopen, 12:49 PM)  Right Left   Pressure 16 15         Pupils       Pupils Dark Light Shape React APD   Right PERRL 3 2 Round Brisk None   Left PERRL 3 2 Round Brisk None         Visual Fields       Left Right    Full Full         Extraocular Movement       Right Left    Full, Ortho Full, Ortho         Neuro/Psych     Oriented x3: Yes   Mood/Affect: Normal         Dilation     Both eyes: 1.0% Mydriacyl, 2.5% Phenylephrine  @ 12:50 PM           Slit Lamp and Fundus Exam     Slit Lamp Exam       Right Left   Lids/Lashes Dermatochalasis - upper lid Dermatochalasis - upper lid, mild MGD   Conjunctiva/Sclera White and quiet White and quiet   Cornea mild arcus, 2-3+ fine Punctate epithelial erosions, mild EBMD, well healed cataract wound 2+ fine Punctate epithelial erosions, arcus, well healed cataract wound   Anterior Chamber deep and clear deep and clear   Iris Round and dilated Round and dilated   Lens PC IOL in good position, trace posterior capsular opacification PC IOL in good position with open PC   Anterior Vitreous syneresis Vitreous syneresis, Posterior vitreous detachment, vitreous condensations         Fundus Exam       Right Left   Disc Pink and Sharp Pink and Sharp   C/D Ratio 0.6 0.6   Macula Flat, Blunted foveal reflex, Drusen, RPE mottling, +PED, No heme or edema Flat, Blunted foveal reflex,  central PEDs, Drusen, RPE mottling and clumping, No heme or edema   Vessels attenuated, Tortuous attenuated, Tortuous   Periphery Attached, mild reticular degeneration, No heme Attached, mild reticular degeneration, No heme           IMAGING AND PROCEDURES  Imaging and Procedures for 10/14/2023  OCT, Retina - OU - Both Eyes       Right Eye Quality was good. Central Foveal Thickness: 313. Progression has been stable. Findings include normal foveal contour, no IRF, no SRF, retinal drusen , pigment epithelial detachment.   Left Eye Quality was good. Central Foveal Thickness: 309. Progression has been stable. Findings include normal foveal contour, no IRF, no SRF, retinal drusen , pigment epithelial detachment (Prominent central PEDs ).   Notes *Images captured and stored on drive  Diagnosis / Impression:  Non-exu ARMD OU - prominent PEDs OU -- stable  Clinical management:  See below  Abbreviations: NFP - Normal foveal profile. CME - cystoid macular edema. PED - pigment epithelial detachment. IRF - intraretinal fluid. SRF - subretinal fluid. EZ - ellipsoid zone. ERM - epiretinal membrane. ORA - outer retinal atrophy. ORT - outer retinal tubulation. SRHM - subretinal hyper-reflective material. IRHM - intraretinal hyper-reflective material             ASSESSMENT/PLAN:    ICD-10-CM   1. Intermediate stage nonexudative age-related macular degeneration of both eyes  H35.3132 OCT, Retina - OU - Both Eyes    2. Pseudophakia, both eyes  Z96.1     3. Dry eyes  H04.123      1. Age related macular degeneration, non-exudative, both eyes  - intermediate  stage with prominent PEDs OU (OS > OD) -- stable  - OCT without significant change from prior  - Cont AREDS 2 supplements and amsler grid monitoring  - f/u 9 months, sooner prn - DFE, OCT  2. Pseudophakia OU  - s/p CE/IOL (Dr. Cleatus)  - IOLs in good position, doing well  - monitor  3. Dry eyes OU - recommend artificial  tears and lubricating ointment as needed -- pt is using Soothe XP TID and Systane / Genteal gel QHS OU - referred to Dr. Gust for evaluation  Ophthalmic Meds Ordered this visit:  No orders of the defined types were placed in this encounter.    Return in about 9 months (around 07/13/2024) for f/u Non Ex. AMD OU , DFE, OCT.  There are no Patient Instructions on file for this visit.  Explained the diagnoses, plan, and follow up with the patient and they expressed understanding.  Patient expressed understanding of the importance of proper follow up care.   This document serves as a record of services personally performed by Redell JUDITHANN Hans, MD, PhD. It was created on their behalf by Avelina Pereyra, COA an ophthalmic technician. The creation of this record is the provider's dictation and/or activities during the visit.   Electronically signed by: Avelina GORMAN Pereyra, COT  10/15/23  12:18 AM   This document serves as a record of services personally performed by Redell JUDITHANN Hans, MD, PhD. It was created on their behalf by Wanda GEANNIE Keens, COT an ophthalmic technician. The creation of this record is the provider's dictation and/or activities during the visit.    Electronically signed by:  Wanda GEANNIE Keens, COT  10/15/23 12:18 AM  Redell JUDITHANN Hans, M.D., Ph.D. Diseases & Surgery of the Retina and Vitreous [facility]  I have reviewed the above documentation for accuracy and completeness, and I agree with the above. Redell JUDITHANN Hans, M.D., Ph.D. 10/15/23 12:19 AM   Abbreviations: M myopia (nearsighted); A astigmatism; H hyperopia (farsighted); P presbyopia; Mrx spectacle prescription;  CTL contact lenses; OD right eye; OS left eye; OU both eyes  XT exotropia; ET esotropia; PEK punctate epithelial keratitis; PEE punctate epithelial erosions; DES dry eye syndrome; MGD meibomian gland dysfunction; ATs artificial tears; PFAT's preservative free artificial tears; NSC nuclear  sclerotic cataract; PSC posterior subcapsular cataract; ERM epi-retinal membrane; PVD posterior vitreous detachment; RD retinal detachment; DM diabetes mellitus; DR diabetic retinopathy; NPDR non-proliferative diabetic retinopathy; PDR proliferative diabetic retinopathy; CSME clinically significant macular edema; DME diabetic macular edema; dbh dot blot hemorrhages; CWS cotton wool spot; POAG primary open angle glaucoma; C/D cup-to-disc ratio; HVF humphrey visual field; GVF goldmann visual field; OCT optical coherence tomography; IOP intraocular pressure; BRVO Branch retinal vein occlusion; CRVO central retinal vein occlusion; CRAO central retinal artery occlusion; BRAO branch retinal artery occlusion; RT retinal tear; SB scleral buckle; PPV pars plana vitrectomy; VH Vitreous hemorrhage; PRP panretinal laser photocoagulation; IVK intravitreal kenalog; VMT vitreomacular traction; MH Macular hole;  NVD neovascularization of the disc; NVE neovascularization elsewhere; AREDS age related eye disease study; ARMD age related macular degeneration; POAG primary open angle glaucoma; EBMD epithelial/anterior basement membrane dystrophy; ACIOL anterior chamber intraocular lens; IOL intraocular lens; PCIOL posterior chamber intraocular lens; Phaco/IOL phacoemulsification with intraocular lens placement; PRK photorefractive keratectomy; LASIK laser assisted in situ keratomileusis; HTN hypertension; DM diabetes mellitus; COPD chronic obstructive pulmonary disease

## 2023-10-14 ENCOUNTER — Ambulatory Visit (INDEPENDENT_AMBULATORY_CARE_PROVIDER_SITE_OTHER): Admitting: Ophthalmology

## 2023-10-14 DIAGNOSIS — Z961 Presence of intraocular lens: Secondary | ICD-10-CM

## 2023-10-14 DIAGNOSIS — H353132 Nonexudative age-related macular degeneration, bilateral, intermediate dry stage: Secondary | ICD-10-CM

## 2023-10-14 DIAGNOSIS — H04123 Dry eye syndrome of bilateral lacrimal glands: Secondary | ICD-10-CM

## 2023-10-15 ENCOUNTER — Encounter (INDEPENDENT_AMBULATORY_CARE_PROVIDER_SITE_OTHER): Payer: Self-pay | Admitting: Ophthalmology

## 2023-11-05 ENCOUNTER — Encounter: Payer: Self-pay | Admitting: Nurse Practitioner

## 2023-11-05 ENCOUNTER — Non-Acute Institutional Stay: Admitting: Nurse Practitioner

## 2023-11-05 VITALS — Ht 65.0 in

## 2023-11-05 DIAGNOSIS — Z66 Do not resuscitate: Secondary | ICD-10-CM | POA: Diagnosis not present

## 2023-11-05 DIAGNOSIS — Z0000 Encounter for general adult medical examination without abnormal findings: Secondary | ICD-10-CM | POA: Diagnosis not present

## 2023-11-05 NOTE — Progress Notes (Signed)
 Subjective:   Erin Ochoa is a 83 y.o. female who presents for Medicare Annual (Subsequent) preventive examination clinic [facility]  Visit Complete: In person  Patient Medicare AWV questionnaire was completed by the patient on 11/05/23; I have confirmed that all information answered by patient is correct and no changes since this date.  Cardiac Risk Factors include: advanced age (>96men, >54 women);dyslipidemia     Objective:    Today's Vitals   11/05/23 0856 11/05/23 1539  Height: 5' 5 (1.651 m)   PainSc:  2    Body mass index is 17.67 kg/m.     11/05/2023    8:57 AM 06/15/2023    3:25 PM 05/22/2023    3:03 PM 04/24/2023   12:27 PM 04/23/2023   12:13 PM 02/05/2023    2:40 PM 01/09/2023   10:38 AM  Advanced Directives  Does Patient Have a Medical Advance Directive? Yes Yes Yes No No Yes Yes  Type of Estate agent of [place];Living will;Out of facility DNR (pink MOST or yellow form) Healthcare Power of [place];Living will;Out of facility DNR (pink MOST or yellow form) Healthcare Power of [place];Living will;Out of facility DNR (pink MOST or yellow form)   Healthcare Power of [place];Living will Healthcare Power of [place];Living will  Does patient want to make changes to medical advance directive? No - Patient declined No - Patient declined No - Patient declined   No - Patient declined No - Patient declined  Copy of Healthcare Power of Attorney in Chart? Yes - validated most recent copy scanned in chart (See row information) Yes - validated most recent copy scanned in chart (See row information) Yes - validated most recent copy scanned in chart (See row information)   Yes - validated most recent copy scanned in chart (See row information) Yes - validated most recent copy scanned in chart (See row information)  Pre-existing out of facility DNR order (yellow form or pink MOST form) Yellow form placed in chart (order not valid for inpatient use) Yellow form placed in  chart (order not valid for inpatient use) Yellow form placed in chart (order not valid for inpatient use)        Current Medications (verified) Outpatient Encounter Medications as of 11/05/2023  Medication Sig   acetaminophen  (TYLENOL ) 500 MG tablet Take 2 tablets (1,000 mg total) by mouth every 8 (eight) hours as needed (for pain).   Artificial Tear Solution (SOOTHE XP) SOLN Place 1 drop into both eyes 3 (three) times daily.   atorvastatin  (LIPITOR) 10 MG tablet TAKE 1/2 TABLET DAILY   Calcium  Carbonate-Vit D-Min (CALTRATE 600+D PLUS MINERALS) 600-800 MG-UNIT CHEW Chew 1 each by mouth 2 (two) times daily.   clonazePAM  (KLONOPIN ) 1 MG tablet Take 1.5 tablets (1.5 mg total) by mouth at bedtime.   HYDROcodone -acetaminophen  (NORCO/VICODIN) 5-325 MG tablet Take 1 tablet by mouth every 8 (eight) hours as needed.   Multiple Vitamins-Minerals (PRESERVISION AREDS 2) CAPS Take 1 capsule by mouth in the morning and at bedtime.   Nutritional Supplements (ENSURE MAX PROTEIN PO) Take 1 Bottle by mouth See admin instructions. Drink 1 bottle by mouth once a day, alternating with one bottle of Ensure original formula every other day   polyethylene glycol powder (GLYCOLAX /MIRALAX ) 17 GM/SCOOP powder Take 17 g by mouth daily.   TEGRETOL  200 MG tablet TAKE 1 AND 1/2 TABLETS BY MOUTH AT BEDTIME   Vitamin D , Ergocalciferol , 50000 units CAPS TAKE ONE CAPSULE BY MOUTH ONCE A WEEK   [DISCONTINUED] lidocaine   4 % Place 1 patch onto the skin daily. (Patient not taking: Reported on 06/15/2023)   [DISCONTINUED] Nutritional Supplements (ENSURE ORIGINAL) LIQD Take 1 Bottle by mouth See admin instructions. Drink 1 bottle by mouth every other day- alternating with one bottle of Ensure High Protein formula every other day (Patient not taking: Reported on 06/15/2023)   [DISCONTINUED] Polyethyl Glycol-Propyl Glycol (SYSTANE) 0.4-0.3 % GEL ophthalmic gel Place 1 Application into both eyes at bedtime. (Patient not taking: Reported on  06/15/2023)   No facility-administered encounter medications on file as of 11/05/2023.    Allergies (verified) Carbamazepine , Other, Cat dander, Cefaclor, Codeine, Fish allergy, Iodinated contrast media, Penicillins, Percocet [oxycodone -acetaminophen ], Red dye #40 (allura red), Senna-docusate sodium  [sennosides-docusate sodium ], Shellfish-derived products, Shrimp (diagnostic), Tomato, and Hm lidocaine  patch [lidocaine ]   History: Past Medical History:  Diagnosis Date   Anxiety    Cataract    Chronic constipation    Depression    Osteoporosis    Seizures ([name])    epilepsy  last seizure 1977   Past Surgical History:  Procedure Laterality Date   ABDOMINAL HYSTERECTOMY  1995   Dr. Fleeta Coup   CATARACT EXTRACTION Bilateral 12/2016   COLONOSCOPY  2012   patchy increased intraepithelial lymphocytes - draelos   ESOPHAGOGASTRODUODENOSCOPY     multiple   FISSURECTOMY     INTRAMEDULLARY (IM) NAIL INTERTROCHANTERIC Left 05/09/2021   Procedure: INTRAMEDULLARY (IM) NAIL INTERTROCHANTRIC;  Surgeon: Addie Cordella Hamilton, MD;  Location: [facility] OR;  Service: Orthopedics;  Laterality: Left;   IR KYPHO EA ADDL LEVEL THORACIC OR LUMBAR  06/26/2021   IR KYPHO THORACIC WITH BONE BIOPSY  06/26/2021   WRIST SURGERY Left    due to fracture   Family History  Problem Relation Age of Onset   Alcohol  abuse Father    Diabetes Father    Diabetes Maternal Grandmother    Diabetes Paternal Grandmother    Colon cancer Maternal Grandfather    Esophageal cancer Neg Hx    Rectal cancer Neg Hx    Stomach cancer Neg Hx    Social History   Socioeconomic History   Marital status: Widowed    Spouse name: Not on file   Number of children: 0   Years of education: Not on file   Highest education level: Some college, no degree  Occupational History   Occupation: Retired  Tobacco Use   Smoking status: Former    Current packs/day: 0.00    Average packs/day: 1 pack/day for 25.0 years (25.0 ttl pk-yrs)    Types:  Cigarettes    Start date: 02/18/1956    Quit date: 02/17/1981    Years since quitting: 42.7   Smokeless tobacco: Never  Vaping Use   Vaping status: Never Used  Substance and Sexual Activity   Alcohol  use: No    Alcohol /week: 0.0 standard drinks of alcohol    Drug use: No   Sexual activity: Not Currently  Other Topics Concern   Not on file  Social History Narrative   Social History     Marital status: Widowed           Spouse name:                        Years of education:  1 year college              Number of children:0           Widowed no children      Occupational  History: Editor, commissioning, Adm. Therapist, music care, [employer].)     None on file      Social History Main Topics     Smoking status: Former Smoker                                             Packs/day: 1.00      Years: 0.00            Types: Cigarettes        Smokeless tobacco: Not on file                        Alcohol  use: No               Drug use: No               Sexual activity: Not on file            Does not drink caffeine, but does eat chocolate.   Does live in an apartment ([address]) retirement community does exercise : walks daily   Right handed         Social Drivers of Corporate investment banker Strain: Low Risk  (03/13/2017)   Overall Financial Resource Strain (CARDIA)    Difficulty of Paying Living Expenses: Not hard at all  Food Insecurity: No Food Insecurity (03/13/2017)   Hunger Vital Sign    Worried About Running Out of Food in the Last Year: Never true    Ran Out of Food in the Last Year: Never true  Transportation Needs: No Transportation Needs (03/13/2017)   PRAPARE - Administrator, Civil Service (Medical): No    Lack of Transportation (Non-Medical): No  Physical Activity: Sufficiently Active (03/13/2017)   Exercise Vital Sign    Days of Exercise per Week: 5 days    Minutes of Exercise per Session: 30 min  Stress: No Stress Concern Present  (03/13/2017)   [employer] of Occupational Health - Occupational Stress Questionnaire    Feeling of Stress : Only a little  Social Connections: Somewhat Isolated (03/13/2017)   Social Connection and Isolation Panel    Frequency of Communication with Friends and Family: More than three times a week    Frequency of Social Gatherings with Friends and Family: More than three times a week    Attends Religious Services: More than 4 times per year    Active Member of [employer] or Organizations: No    Attends Banker Meetings: Never    Marital Status: Widowed    Tobacco Counseling Counseling given: Not Answered   Clinical Intake:  Pre-visit preparation completed: Yes  Pain : 0-10 Pain Score: 2  Pain Type: Chronic pain Pain Location: Back Pain Orientation: Mid Pain Radiating Towards: none Pain Descriptors / Indicators: Aching Pain Onset: More than a month ago Pain Frequency: Intermittent Pain Relieving Factors: Tylenol  Effect of Pain on Daily Activities: none  Pain Relieving Factors: Tylenol   BMI - recorded: 17 Nutritional Status: BMI <19  Underweight Nutritional Risks: None Diabetes: No (prediabetes)  How often do you need to have someone help you when you read instructions, pamphlets, or other written materials from your doctor or pharmacy?: 3 - Sometimes What is the last grade level you completed in school?: college  Interpreter Needed?: No  Information entered by ::  Wilian Kwong Lorenda Hark NP   Activities of Daily Living    11/05/2023    3:45 PM 06/15/2023    2:40 PM  In your present state of health, do you have any difficulty performing the following activities:  Hearing? 0 0  Vision? 0 0  Difficulty concentrating or making decisions? 0 0  Walking or climbing stairs? 1 1  Dressing or bathing? 1 1  Doing errands, shopping? 1 1  Preparing Food and eating ? N N  Using the Toilet? N N  In the past six months, have you accidently leaked urine? N Y  Do you have  problems with loss of bowel control? N N  Managing your Medications? Y Y  Managing your Finances? Y Y  Housekeeping or managing your Housekeeping? Erin Ochoa Erin Ochoa    Patient Care Team: Jeslin Bazinet X, NP as PCP - General (Internal Medicine) Jacek Colson X, NP as Nurse Practitioner (Internal Medicine)  Indicate any recent Medical Services you may have received from other than [facility] providers in the past year (date may be approximate).     Assessment:   This is a routine wellness examination for Erin Ochoa.  Hearing/Vision screen Vision Screening - Comments:: Last eye exam less than 12 months ago with Dr.Zamora   Goals Addressed             This Visit's Progress    Functional Decline Minimized       Evidence-based guidance:  Assess fall risk, including balance and gait impairment, muscle weakness, diminished vision or hearing, environmental hazards, effects of medication and dehydration.  Assess and review gait speed, decreased muscle strength or impaired functional mobility; consider use of validated tool if available.  Prepare patient for rehabilitation services to develop an individualized activity and exercise program as indicated, such as progressive resistance strengthening, balance and activity tolerance training or home exercise program.  Prevent falls with environmental adjustment; encourage compliance with exercise program, management of incontinence and adequate vitamin D  and calcium  intake from food or supplements.  Promote gradual increase in intensity of activity and exercise as tolerated, such as duration, frequency, exercise repetition and sets and intensity.  Provide guidance and interventions aimed at fall prevention.  Review or assess presence of reduced muscle mass or results of dual-energy x-ray absorptiometry.   Notes:        Depression Screen    11/05/2023    3:47 PM 06/15/2023    2:47 PM 02/05/2023    4:15 PM 02/05/2023    2:39 PM 01/09/2023   11:07 AM 01/02/2023   10:47  AM 12/11/2022   12:58 PM  PHQ 2/9 Scores  PHQ - 2 Score 0 0 0 0 4 0 0  PHQ- 9 Score   0  10      Fall Risk    04/30/2023    1:47 PM 02/05/2023    2:39 PM 01/09/2023   10:38 AM 01/02/2023   10:47 AM 12/11/2022   12:57 PM  Fall Risk   Falls in the past year? 1 0 0 0 0  Number falls in past yr: 1 0 0 0 0  Injury with Fall? 1 0 0 0 0  Risk for fall due to : History of fall(s)  No Fall Risks No Fall Risks No Fall Risks  Follow up Falls evaluation completed  Falls evaluation completed;Education provided;Falls prevention discussed Falls evaluation completed Falls evaluation completed    MEDICARE RISK AT HOME:    TIMED UP AND GO:  Was the  test performed?  Yes  Length of time to ambulate 10 feet: 12 sec Gait slow and steady with assistive device    Cognitive Function:    11/06/2022    3:26 PM  MMSE - Mini Mental State Exam  Orientation to time 5  Orientation to Place 5  Registration 3  Attention/ Calculation 5  Recall 2  Language- name 2 objects 2  Language- repeat 1  Language- follow 3 step command 3  Language- read & follow direction 1  Write a sentence 1  Copy design 1  Total score 29        10/29/2021   11:16 AM 10/18/2020    1:16 PM  6CIT Screen  What Year? 0 points 0 points  What month? 0 points 0 points  What time? 0 points 0 points  Count back from 20 0 points 2 points  Months in reverse 0 points 0 points  Repeat phrase 0 points 0 points  Total Score 0 points 2 points    Immunizations Immunization History  Administered Date(s) Administered   Fluad Quad(high Dose 65+) 12/03/2020, 12/17/2022   Fluad Trivalent(High Dose 65+) 12/17/2022   INFLUENZA, HIGH DOSE SEASONAL PF 11/26/2016, 12/01/2018, 11/30/2019, 11/21/2021   Influenza Whole 11/19/2017   Influenza-Unspecified 11/18/2014, 11/29/2015   Moderna Covid-19 Fall Seasonal Vaccine 27yrs & older 12/03/2022   Moderna Covid-19 Vaccine  Bivalent Booster 66yrs & up 07/05/2021   Moderna Sars-Covid-2  Vaccination 02/21/2019, 03/21/2019, 12/27/2019, 06/26/2020   Pfizer Covid-19 Vaccine Bivalent Booster 3yrs & up 11/06/2020   Pneumococcal Conjugate-13 09/24/2017   Pneumococcal Polysaccharide-23 12/30/2012, 09/18/2018   Respiratory Syncytial Virus Vaccine,Recomb Aduvanted(Arexvy) 02/01/2022, 02/06/2022   Tdap 08/20/2017   Zoster Recombinant(Shingrix) 07/21/2017, 12/08/2017   Zoster, Live 05/28/2007    TDAP status: Up to date  Flu Vaccine status: Up to date  Pneumococcal vaccine status: Up to date  Covid-19 vaccine status: Information provided on how to obtain vaccines.   Qualifies for Shingles Vaccine? Yes   Zostavax completed Yes   Shingrix Completed?: Yes  Screening Tests Health Maintenance  Topic Date Due   HEMOGLOBIN A1C  11/05/2023   FOOT EXAM  11/06/2023   Diabetic kidney evaluation - Urine ACR  11/11/2023   Influenza Vaccine  11/18/2023 (Originally 09/18/2023)   COVID-19 Vaccine (8 - Moderna risk 2024-25 season) 11/18/2023 (Originally 10/19/2023)   OPHTHALMOLOGY EXAM  01/05/2024   Diabetic kidney evaluation - eGFR measurement  04/23/2024   Mammogram  05/11/2024   Medicare Annual Wellness (AWV)  11/04/2024   DTaP/Tdap/Td (2 - Td or Tdap) 08/21/2027   Pneumococcal Vaccine: 50+ Years  Completed   DEXA SCAN  Completed   Zoster Vaccines- Shingrix  Completed   HPV VACCINES  Aged Out   Meningococcal B Vaccine  Aged Out    Health Maintenance  Health Maintenance Due  Topic Date Due   HEMOGLOBIN A1C  11/05/2023   FOOT EXAM  11/06/2023   Diabetic kidney evaluation - Urine ACR  11/11/2023    Colorectal cancer screening: No longer required.   Mammogram status: No longer required due to aged out.  Bone Density status: Completed 05/12/22. Results reflect: Bone density results: OSTEOPOROSIS. Repeat every 2-3 years.  Lung Cancer Screening: (Low Dose CT Chest recommended if Age 27-80 years, 20 pack-year currently smoking OR have quit w/in 15years.) does not qualify.   Lung  Cancer Screening Referral: NA  Additional Screening:  Hepatitis C Screening: does not qualify;   Vision Screening: Recommended annual ophthalmology exams for early detection of glaucoma and  other disorders of the eye. Is the patient up to date with their annual eye exam?  Yes  Who is the provider or what is the name of the office in which the patient attends annual eye exams? 06/29/23, the patient will provide If pt is not established with a provider, would they like to be referred to a provider to establish care? No .   Dental Screening: Recommended annual dental exams for proper oral hygiene  Diabetic Foot Exam: completed  Community Resource Referral / Chronic Care Management: CRR required this visit?  No   CCM required this visit?  No     Plan:     I have personally reviewed and noted the following in the patient's chart:   Medical and social history Use of alcohol , tobacco or illicit drugs  Current medications and supplements including opioid prescriptions. Patient is not currently taking opioid prescriptions. Functional ability and status Nutritional status Physical activity Advanced directives List of other physicians Hospitalizations, surgeries, and ER visits in previous 12 months Vitals Screenings to include cognitive, depression, and falls Referrals and appointments  In addition, I have reviewed and discussed with patient certain preventive protocols, quality metrics, and best practice recommendations. A written personalized care plan for preventive services as well as general preventive health recommendations were provided to patient.     Sydney Azure X Dejanique Ruehl, NP   11/09/2023   After Visit Summary: (In Person-Declined) Patient declined AVS at this time.

## 2023-11-05 NOTE — Patient Instructions (Signed)
 1.) Visit your local pharmacy to receive your covid booster.  2.) Contact Verneita, (independent living nurse) about getting on the schedule for the flu vaccine clinic scheduled on 12/03/23 ([facility]) 10:30-1 pm

## 2023-11-09 ENCOUNTER — Encounter: Payer: Self-pay | Admitting: Nurse Practitioner

## 2023-11-10 DIAGNOSIS — Z131 Encounter for screening for diabetes mellitus: Secondary | ICD-10-CM | POA: Diagnosis not present

## 2023-11-10 DIAGNOSIS — R7303 Prediabetes: Secondary | ICD-10-CM | POA: Diagnosis not present

## 2023-11-11 DIAGNOSIS — Z131 Encounter for screening for diabetes mellitus: Secondary | ICD-10-CM | POA: Diagnosis not present

## 2023-11-12 ENCOUNTER — Encounter: Payer: Medicare Other | Admitting: Nurse Practitioner

## 2023-11-12 LAB — HEMOGLOBIN A1C: Hemoglobin A1C: 5.6

## 2023-11-13 ENCOUNTER — Encounter: Payer: Self-pay | Admitting: Sports Medicine

## 2023-11-13 NOTE — Progress Notes (Signed)
 This encounter was created in error - please disregard.

## 2023-11-30 ENCOUNTER — Non-Acute Institutional Stay: Payer: Self-pay | Admitting: Sports Medicine

## 2023-11-30 DIAGNOSIS — F411 Generalized anxiety disorder: Secondary | ICD-10-CM | POA: Diagnosis not present

## 2023-11-30 DIAGNOSIS — E785 Hyperlipidemia, unspecified: Secondary | ICD-10-CM | POA: Diagnosis not present

## 2023-11-30 DIAGNOSIS — G40909 Epilepsy, unspecified, not intractable, without status epilepticus: Secondary | ICD-10-CM

## 2023-11-30 DIAGNOSIS — F5101 Primary insomnia: Secondary | ICD-10-CM | POA: Diagnosis not present

## 2023-11-30 DIAGNOSIS — E559 Vitamin D deficiency, unspecified: Secondary | ICD-10-CM | POA: Diagnosis not present

## 2023-11-30 NOTE — Progress Notes (Signed)
 Provider:  Dr. Jackalyn Blazing Location:  [facility] Place of Service:   Assisted living   PCP: Mast, Man X, NP Patient Care Team: Mast, Man X, NP as PCP - General (Internal Medicine) Mast, Man X, NP as Nurse Practitioner (Internal Medicine)  Extended Emergency Contact Information Primary Emergency Contact: [name],[name]  [place] Mobile Phone: [phone] Relation: Friend  Goals of Care: Advanced Directive information    11/13/2023   12:17 PM  Advanced Directives  Does Patient Have a Medical Advance Directive? Yes  Type of Estate agent of [place];Living will;Out of facility DNR (pink MOST or yellow form)  Does patient want to make changes to medical advance directive? No - Patient declined  Copy of Healthcare Power of Attorney in Chart? Yes - validated most recent copy scanned in chart (See row information)  Pre-existing out of facility DNR order (yellow form or pink MOST form) Yellow form placed in chart (order not valid for inpatient use)       History of Present Illness  83 yr old F with h/o GAD, seizure disorder, OA, dizziness, constipation, HLD, insomnia, is seen today for chronic disease management Pt seen and examined in her room  She seems pleasant and comfortable and does not appear to be in distress.  Denies chest pain palpitations, SOB, abdominal pain, nausea, vomiting, dysuria, hematuria, bloody or dark stools.     Latest Ref Rng & Units 09/12/2023    7:57 PM 04/24/2023    4:13 PM 04/23/2023    4:14 PM  CBC  WBC  6.6     8.1  9.1   Hemoglobin 12.0 - 16.0 12.2     13.2  13.8   Hematocrit 36 - 46 37     41.2  41.6   Platelets 150 - 400 K/uL 206     172  177      This result is from an external source.       Latest Ref Rng & Units 09/12/2023    7:57 PM 04/24/2023    4:13 PM 04/23/2023    4:14 PM  BMP  Glucose 70 - 99 mg/dL  881  891   BUN 4 - 21 9     28  26    Creatinine 0.5 - 1.1 0.5     0.61  0.55    Sodium 137 - 147 134     141  140   Potassium 3.5 - 5.1 mEq/L 4.1     3.9  4.1   Chloride 99 - 108 99     104  105   CO2 13 - 22 28     27  25    Calcium  8.7 - 10.7 8.8     9.3  9.6      This result is from an external source.      Past Medical History:  Diagnosis Date   Anxiety    Cataract    Chronic constipation    Depression    Osteoporosis    Seizures ([name])    epilepsy  last seizure 1977   Past Surgical History:  Procedure Laterality Date   ABDOMINAL HYSTERECTOMY  1995   Dr. Fleeta Coup   CATARACT EXTRACTION Bilateral 12/2016   COLONOSCOPY  2012   patchy increased intraepithelial lymphocytes - draelos   ESOPHAGOGASTRODUODENOSCOPY     multiple   FISSURECTOMY     INTRAMEDULLARY (IM) NAIL INTERTROCHANTERIC Left 05/09/2021   Procedure: INTRAMEDULLARY (IM) NAIL INTERTROCHANTRIC;  Surgeon:  Addie Cordella Hamilton, MD;  Location: [facility] OR;  Service: Orthopedics;  Laterality: Left;   IR KYPHO EA ADDL LEVEL THORACIC OR LUMBAR  06/26/2021   IR KYPHO THORACIC WITH BONE BIOPSY  06/26/2021   WRIST SURGERY Left    due to fracture    reports that she quit smoking about 42 years ago. Her smoking use included cigarettes. She started smoking about 67 years ago. She has a 25 pack-year smoking history. She has never used smokeless tobacco. She reports that she does not drink alcohol  and does not use drugs. Social History   Socioeconomic History   Marital status: Widowed    Spouse name: Not on file   Number of children: 0   Years of education: Not on file   Highest education level: Some college, no degree  Occupational History   Occupation: Retired  Tobacco Use   Smoking status: Former    Current packs/day: 0.00    Average packs/day: 1 pack/day for 25.0 years (25.0 ttl pk-yrs)    Types: Cigarettes    Start date: 02/18/1956    Quit date: 02/17/1981    Years since quitting: 42.8   Smokeless tobacco: Never  Vaping Use   Vaping status: Never Used  Substance and Sexual Activity   Alcohol   use: No    Alcohol /week: 0.0 standard drinks of alcohol    Drug use: No   Sexual activity: Not Currently  Other Topics Concern   Not on file  Social History Narrative   Social History     Marital status: Widowed           Spouse name:                        Years of education:  1 year college              Number of children:0           Widowed no children      Occupational History: Editor, commissioning, Adm. Therapist, music care, [employer].)     None on file      Social History Main Topics     Smoking status: Former Smoker                                             Packs/day: 1.00      Years: 0.00            Types: Cigarettes        Smokeless tobacco: Not on file                        Alcohol  use: No               Drug use: No               Sexual activity: Not on file            Does not drink caffeine, but does eat chocolate.   Does live in an apartment ([address]) retirement community does exercise : walks daily   Right handed         Social Drivers of Corporate investment banker Strain: Low Risk  (03/13/2017)   Overall Financial Resource Strain (CARDIA)    Difficulty of Paying Living Expenses: Not hard at all  Food Insecurity:  No Food Insecurity (03/13/2017)   Hunger Vital Sign    Worried About Running Out of Food in the Last Year: Never true    Ran Out of Food in the Last Year: Never true  Transportation Needs: No Transportation Needs (03/13/2017)   PRAPARE - Administrator, Civil Service (Medical): No    Lack of Transportation (Non-Medical): No  Physical Activity: Sufficiently Active (03/13/2017)   Exercise Vital Sign    Days of Exercise per Week: 5 days    Minutes of Exercise per Session: 30 min  Stress: No Stress Concern Present (03/13/2017)   [employer] of Occupational Health - Occupational Stress Questionnaire    Feeling of Stress : Only a little  Social Connections: Somewhat Isolated (03/13/2017)   Social Connection and  Isolation Panel    Frequency of Communication with Friends and Family: More than three times a week    Frequency of Social Gatherings with Friends and Family: More than three times a week    Attends Religious Services: More than 4 times per year    Active Member of [employer] or Organizations: No    Attends Banker Meetings: Never    Marital Status: Widowed  Intimate Partner Violence: Not At Risk (03/13/2017)   Humiliation, Afraid, Rape, and Kick questionnaire    Fear of Current or Ex-Partner: No    Emotionally Abused: No    Physically Abused: No    Sexually Abused: No    Functional Status Survey:    Family History  Problem Relation Age of Onset   Alcohol  abuse Father    Diabetes Father    Diabetes Maternal Grandmother    Diabetes Paternal Grandmother    Colon cancer Maternal Grandfather    Esophageal cancer Neg Hx    Rectal cancer Neg Hx    Stomach cancer Neg Hx     Health Maintenance  Topic Date Due   Influenza Vaccine  09/18/2023   COVID-19 Vaccine (8 - 2025-26 season) 10/19/2023   Diabetic kidney evaluation - Urine ACR  11/11/2023   FOOT EXAM  11/06/2023   Mammogram  05/11/2024   HEMOGLOBIN A1C  05/11/2024   OPHTHALMOLOGY EXAM  06/28/2024   Diabetic kidney evaluation - eGFR measurement  09/11/2024   Medicare Annual Wellness (AWV)  11/04/2024   DTaP/Tdap/Td (2 - Td or Tdap) 08/21/2027   Pneumococcal Vaccine: 50+ Years  Completed   DEXA SCAN  Completed   Zoster Vaccines- Shingrix  Completed   Meningococcal B Vaccine  Aged Out    Allergies  Allergen Reactions   Carbamazepine  Other (See Comments)    HAS TO TAKE NAME BRAND ONLY TEGRETOL  OR A SEIZURE OCCURS   Other Other (See Comments)    NO FOODS WITH Seeds -- Reflux   Cat Dander Swelling and Other (See Comments)    Patient states that it causes her eyes to swell shut   Cefaclor Other (See Comments)    Unknown reaction   Codeine Nausea Only   Fish Allergy Nausea Only   Iodinated Contrast Media Other  (See Comments)    Unknown reaction     Penicillins Itching   Percocet [Oxycodone -Acetaminophen ] Nausea And Vomiting   Red Dye #40 (Allura Red) Other (See Comments)    Reflux   Senna-Docusate Sodium  [Sennosides-Docusate Sodium ] Other (See Comments)    Severe stomach pains   Shellfish Protein-Containing Drug Products Nausea Only   Shrimp (Diagnostic) Nausea Only    Stomach pain    Tomato Other (See Comments)  Reflux   Hm Lidocaine  Patch [Lidocaine ] Rash    Says that this was related to putting a heating pad on the area with the lidocaine  patch, has used lidocaine  patches in other areas without a reaction    Outpatient Encounter Medications as of 11/30/2023  Medication Sig   acetaminophen  (TYLENOL ) 500 MG tablet Take 2 tablets (1,000 mg total) by mouth every 8 (eight) hours as needed (for pain). (Patient not taking: Reported on 11/13/2023)   acetaminophen  (TYLENOL ) 650 MG CR tablet Take 650 mg by mouth daily at 6 (six) AM. Give 650 mg by mouth three times a day for pain   Artificial Tear Solution (SOOTHE XP) SOLN Place 1 drop into both eyes 3 (three) times daily.   atorvastatin  (LIPITOR) 10 MG tablet TAKE 1/2 TABLET DAILY   Calcium  Carbonate-Vit D-Min (CALTRATE 600+D PLUS MINERALS) 600-800 MG-UNIT CHEW Chew 1 each by mouth 2 (two) times daily.   clonazePAM  (KLONOPIN ) 1 MG tablet Take 1.5 tablets (1.5 mg total) by mouth at bedtime.   HYDROcodone -acetaminophen  (NORCO/VICODIN) 5-325 MG tablet Take 1 tablet by mouth every 8 (eight) hours as needed. (Patient not taking: Reported on 11/13/2023)   Magnesium  Glycinate 100 MG CAPS Take 100 mg by mouth daily. Give 100 mg by mouth one time a day for supplement   Melatonin 5 MG CAPS Take 5 mg by mouth at bedtime. Give 2 tablet by mouth at bedtime for sleep   Multiple Vitamins-Minerals (PRESERVISION AREDS 2) CAPS Take 1 capsule by mouth in the morning and at bedtime.   Nutritional Supplements (ENSURE MAX PROTEIN PO) Take 1 Bottle by mouth See  admin instructions. Drink 1 bottle by mouth once a day, alternating with one bottle of Ensure original formula every other day   polyethylene glycol powder (GLYCOLAX /MIRALAX ) 17 GM/SCOOP powder Take 17 g by mouth daily.   TEGRETOL  200 MG tablet TAKE 1 AND 1/2 TABLETS BY MOUTH AT BEDTIME   Turmeric (QC TUMERIC COMPLEX) 500 MG CAPS Take 500 mg by mouth daily.  Give 500 mg by mouth one time a day for supplement   Vitamin D , Ergocalciferol , 50000 units CAPS TAKE ONE CAPSULE BY MOUTH ONCE A WEEK (Patient not taking: Reported on 11/13/2023)   No facility-administered encounter medications on file as of 11/30/2023.    Review of Systems  Constitutional:  Negative for fever.  HENT:  Negative for sore throat.   Respiratory:  Negative for cough, shortness of breath and wheezing.   Cardiovascular:  Negative for chest pain and palpitations.  Gastrointestinal:  Negative for abdominal pain, blood in stool, constipation, diarrhea, nausea and vomiting.  Genitourinary:  Negative for dysuria.  Neurological:  Negative for dizziness.   Negative unless indicated in HPI.  There were no vitals filed for this visit. There is no height or weight on file to calculate BMI. BP Readings from Last 3 Encounters:  11/13/23 110/60  07/27/23 131/63  07/24/23 131/63   Wt Readings from Last 3 Encounters:  11/13/23 107 lb (48.5 kg)  07/27/23 106 lb 3.2 oz (48.2 kg)  07/24/23 106 lb 3.2 oz (48.2 kg)   Physical Exam Constitutional:      Appearance: Normal appearance.  HENT:     Head: Normocephalic and atraumatic.  Cardiovascular:     Rate and Rhythm: Normal rate and regular rhythm.  Pulmonary:     Effort: Pulmonary effort is normal. No respiratory distress.     Breath sounds: Normal breath sounds. No wheezing.  Abdominal:     General: Bowel  sounds are normal. There is no distension.     Tenderness: There is no abdominal tenderness. There is no guarding or rebound.     Comments:    Musculoskeletal:         General: No swelling.  Skin:    General: Skin is dry.  Neurological:     Mental Status: She is alert. Mental status is at baseline.     Motor: No weakness.     Labs reviewed: Basic Metabolic Panel: Recent Labs    01/13/23 0725 04/23/23 1614 04/24/23 1613 09/12/23 1957  NA 141 140 141 134*  K 3.8 4.1 3.9 4.1  CL 103 105 104 99  CO2 28 25 27  28*  GLUCOSE 110* 108* 118*  --   BUN 19 26* 28* 9  CREATININE 0.67 0.55 0.61 0.5  CALCIUM  9.4 9.6 9.3 8.8   Liver Function Tests: Recent Labs    04/23/23 1614 09/12/23 1957  AST 29 20  ALT 25 17  ALKPHOS 39  --   BILITOT 0.5  --   PROT 6.6  --   ALBUMIN 4.3  --    No results for input(s): LIPASE, AMYLASE in the last 8760 hours. No results for input(s): AMMONIA in the last 8760 hours. CBC: Recent Labs    01/13/23 0725 04/23/23 1614 04/24/23 1613 09/12/23 1957  WBC 5.4 9.1 8.1 6.6  NEUTROABS  --  7.3  --   --   HGB 14.0 13.8 13.2 12.2  HCT 41.8 41.6 41.2 37  MCV 99.8 101.5* 102.0*  --   PLT 207 177 172 206   Cardiac Enzymes: No results for input(s): CKTOTAL, CKMB, CKMBINDEX, TROPONINI in the last 8760 hours. BNP: Invalid input(s): POCBNP Lab Results  Component Value Date   HGBA1C 5.6 11/12/2023   Lab Results  Component Value Date   TSH 2.49 05/05/2023   No results found for: VITAMINB12 No results found for: FOLATE No results found for: IRON, TIBC, FERRITIN  Imaging and Procedures obtained prior to SNF admission: CT Lumbar Spine Wo Contrast Result Date: 04/24/2023 CLINICAL DATA:  Fall yesterday and today.  Back pain. EXAM: CT LUMBAR SPINE WITHOUT CONTRAST TECHNIQUE: Multidetector CT imaging of the lumbar spine was performed without intravenous contrast administration. Multiplanar CT image reconstructions were also generated. RADIATION DOSE REDUCTION: This exam was performed according to the departmental dose-optimization program which includes automated exposure control, adjustment of  the mA and/or kV according to patient size and/or use of iterative reconstruction technique. COMPARISON:  MRI lumbar spine 06/23/2021. FINDINGS: Segmentation: 5 non rib-bearing lumbar type vertebral bodies are present. The lowest fully formed vertebral body is L5. Alignment: No significant listhesis is present. Lumbar lordosis is preserved. Rightward curvature is centered at L2-3. Vertebrae: Remote spinal augmentation at L1 is noted. Vertebral body heights are otherwise normal. No other acute scratched at no acute fracture is present. Paraspinal and other soft tissues: Atherosclerotic calcifications are present in the aorta without aneurysm. A 4.2 cm simple cyst is present at the upper pole of the right kidney. The visualized abdomen is otherwise within normal limits. No significant adenopathy is present. Disc levels: No significant focal disc protrusion or stenosis is present in lumbar spine. Facet hypertrophy is present bilaterally at L5-S1. IMPRESSION: 1. No acute fracture or traumatic subluxation. 2. Remote spinal augmentation at L1. 3. Rightward curvature of the lumbar spine centered at L2-3. 4. Facet hypertrophy bilaterally at L5-S1. 5. 4.2 cm simple cyst at the upper pole of the right kidney. Electronically Signed  By: Lonni Necessary M.D.   On: 04/24/2023 18:15   CT Thoracic Spine Wo Contrast Result Date: 04/24/2023 CLINICAL DATA:  Fall yesterday. Unwitnessed fall with trauma to head. Dizziness. EXAM: CT THORACIC SPINE WITHOUT CONTRAST TECHNIQUE: Multidetector CT images of the thoracic were obtained using the standard protocol without intravenous contrast. RADIATION DOSE REDUCTION: This exam was performed according to the departmental dose-optimization program which includes automated exposure control, adjustment of the mA and/or kV according to patient size and/or use of iterative reconstruction technique. COMPARISON:  MRI of the thoracic spine 06/23/2021. FINDINGS: Alignment: No significant  listhesis is present. Thoracic kyphosis is stable. Mild leftward curvature of the thoracic spine is centered at T10. Vertebrae: Acute superior endplate fracture is present at T6 the vertebral body height is reduced to 10 mm. This compares to 14 mm at T5. No other acute fractures are present. Spinal augmentation is noted at T8 and L1. Paraspinous soft tissues: Minimal soft tissue density adjacent to the T6 vertebral body is likely posttraumatic. Paraspinous soft tissues are otherwise within normal limits. Descending thoracic aortic calcifications are present without aneurysm. The visualized lung fields are clear. No significant pleural effusion or pneumothorax is present. The upper abdomen is within normal limits. Disc levels: A left paramedian disc protrusion partially effaces the left ventral CSF at T9-10. No other significant central disc disease or stenosis is present. Osseous foraminal narrowing is present on the right at T8-9. The foramina are otherwise patent bilaterally. IMPRESSION: 1. Acute superior endplate fracture at T6 with 10 mm of vertebral body height loss. 2. No other acute fractures. 3. Spinal augmentation at T8 and L1. 4. Left paramedian disc protrusion at T9-10 partially effaces the left ventral CSF. 5. Osseous foraminal narrowing on the right at T8-9. Electronically Signed   By: Lonni Necessary M.D.   On: 04/24/2023 18:07    Assessment and Plan Assessment & Plan  GAD  Doing better Cont with clonazepam   Monitor for sedation , falls  Insomnia No issues Cont with melatonin   HLD  Cont with lipitor   Seizure disorder Cont with tegretol   Vit d Deficiency  Cont with vit D supplements

## 2023-12-02 ENCOUNTER — Ambulatory Visit: Admitting: Physician Assistant

## 2023-12-04 ENCOUNTER — Encounter: Payer: Self-pay | Admitting: Sports Medicine

## 2023-12-14 DIAGNOSIS — Z23 Encounter for immunization: Secondary | ICD-10-CM | POA: Diagnosis not present

## 2024-01-01 ENCOUNTER — Other Ambulatory Visit: Payer: Self-pay | Admitting: Nurse Practitioner

## 2024-01-01 DIAGNOSIS — F5101 Primary insomnia: Secondary | ICD-10-CM

## 2024-01-01 MED ORDER — CLONAZEPAM 1 MG PO TABS: 1.5000 mg | ORAL_TABLET | Freq: Every day | ORAL | 0 refills | Status: DC

## 2024-01-22 ENCOUNTER — Encounter: Payer: Self-pay | Admitting: Nurse Practitioner

## 2024-01-22 ENCOUNTER — Non-Acute Institutional Stay: Payer: Self-pay | Admitting: Nurse Practitioner

## 2024-01-22 DIAGNOSIS — M1990 Unspecified osteoarthritis, unspecified site: Secondary | ICD-10-CM

## 2024-01-22 DIAGNOSIS — E785 Hyperlipidemia, unspecified: Secondary | ICD-10-CM | POA: Diagnosis not present

## 2024-01-22 DIAGNOSIS — F5101 Primary insomnia: Secondary | ICD-10-CM

## 2024-01-22 DIAGNOSIS — G40909 Epilepsy, unspecified, not intractable, without status epilepticus: Secondary | ICD-10-CM

## 2024-01-22 DIAGNOSIS — R7303 Prediabetes: Secondary | ICD-10-CM

## 2024-01-22 NOTE — Assessment & Plan Note (Signed)
 no active seizures since last seen, failed generic Tegretol  in the past, on Tegretol ,  Clonazepam  hs (GDR to stop on her own didn't cause active seizures, but flare up insomnia and anxiety). S/p Neurology

## 2024-01-22 NOTE — Assessment & Plan Note (Signed)
 Has been stable for long-term until 2 days ago on MiraLax , Benefiber Encourage oral fluid intake, adding MiraLAX  1 time today, observe

## 2024-01-22 NOTE — Assessment & Plan Note (Signed)
 s/p left hip surgery, s/p Kyphplasty, T6 fx 04/24/23, pain is controlled, taking Tylenol , off Norco.

## 2024-01-22 NOTE — Assessment & Plan Note (Signed)
 takes Atorvastatin 5mg  qd, LDL 90 05/05/23

## 2024-01-22 NOTE — Assessment & Plan Note (Signed)
 takes Clonazepam , failed GDR,  Mg, Tumeric, failed Ambien , declined Effexor . TSH 2.49 05/05/23

## 2024-01-22 NOTE — Progress Notes (Signed)
 Location:   [facility] Room Number: 803 Place of Service:  ALF (13) Provider: Larwance Takyia Sindt NP  Angela Vazguez X, NP  Patient Care Team: Glena Pharris X, NP as PCP - General (Internal Medicine) Emmerich Cryer X, NP as Nurse Practitioner (Internal Medicine)  Extended Emergency Contact Information Primary Emergency Contact: [name],[name]  [place] Mobile Phone: [phone] Relation: Friend  Code Status:  DNR Goals of care: Advanced Directive information    11/13/2023   12:17 PM  Advanced Directives  Does Patient Have a Medical Advance Directive? Yes  Type of Estate Agent of [place];Living will;Out of facility DNR (pink MOST or yellow form)  Does patient want to make changes to medical advance directive? No - Patient declined  Copy of Healthcare Power of Attorney in Chart? Yes - validated most recent copy scanned in chart (See row information)  Pre-existing out of facility DNR order (yellow form or pink MOST form) Yellow form placed in chart (order not valid for inpatient use)     Chief Complaint  Patient presents with   Medical Management of Chronic Issues    HPI:  Pt is a 83 y.o. female seen today for medical management of chronic diseases.    ED 04/24/23 T6 fx on CT T spine, L spine. Norco, Tylenol  for pain.  ED 04/23/23 for dizziness, fall, head injury/scal laceration, etiology was deemed to active seizure, followed by Neurology, CT head/cervical spine, EKG, labs unremarkable.              OA, s/p left hip surgery, s/p Kyphplasty, T6 fx 04/24/23, pain is controlled, taking Tylenol , off Norco.               Dizziness: recurrent when not sleeping well.               Post op anemia, Hgb 12.2 09/12/23             Seizures, no active seizures since last seen, failed generic Tegretol  in the past, on Tegretol ,  Clonazepam  hs (GDR to stop on her own didn't cause active seizures, but flare up insomnia and anxiety). S/p Neurology              Constipation,  on MiraLax , Benefiber             Hyperlipidemia, takes Atorvastatin  5mg  qd, LDL 90 05/05/23             Prediabetic, Hgb a1c 5. 6 11/12/23, lifestyle modification ACR 5 11/12/23             OP takes Reclast  in the past,  t-score -3.6 09/13/19, -3.5 05/12/22, pending Osteoporosis clinic f/u             Insomnia/anxiety, takes Clonazepam , failed GDR,  Mg, Tumeric, failed Ambien , declined Effexor . TSH 2.49 05/05/23             Erosive gastropathy, off PPI  Past Medical History:  Diagnosis Date   Anxiety    Cataract    Chronic constipation    Depression    Osteoporosis    Seizures ([name])    epilepsy  last seizure 1977   Past Surgical History:  Procedure Laterality Date   ABDOMINAL HYSTERECTOMY  1995   Dr. Fleeta Coup   CATARACT EXTRACTION Bilateral 12/2016   COLONOSCOPY  2012   patchy increased intraepithelial lymphocytes - draelos   ESOPHAGOGASTRODUODENOSCOPY     multiple   FISSURECTOMY     INTRAMEDULLARY (IM) NAIL  INTERTROCHANTERIC Left 05/09/2021   Procedure: INTRAMEDULLARY (IM) NAIL INTERTROCHANTRIC;  Surgeon: Addie Cordella Hamilton, MD;  Location: [facility] OR;  Service: Orthopedics;  Laterality: Left;   IR KYPHO EA ADDL LEVEL THORACIC OR LUMBAR  06/26/2021   IR KYPHO THORACIC WITH BONE BIOPSY  06/26/2021   WRIST SURGERY Left    due to fracture    Allergies  Allergen Reactions   Carbamazepine  Other (See Comments)    HAS TO TAKE NAME BRAND ONLY TEGRETOL  OR A SEIZURE OCCURS   Other Other (See Comments)    NO FOODS WITH Seeds -- Reflux   Cat Dander Swelling and Other (See Comments)    Patient states that it causes her eyes to swell shut   Cefaclor Other (See Comments)    Unknown reaction   Codeine Nausea Only   Fish Allergy Nausea Only   Iodinated Contrast Media Other (See Comments)    Unknown reaction     Penicillins Itching   Percocet [Oxycodone -Acetaminophen ] Nausea And Vomiting   Red Dye #40 (Allura Red) Other (See Comments)    Reflux   Senna-Docusate Sodium   [Sennosides-Docusate Sodium ] Other (See Comments)    Severe stomach pains   Shellfish Protein-Containing Drug Products Nausea Only   Shrimp (Diagnostic) Nausea Only    Stomach pain    Tomato Other (See Comments)    Reflux   Hm Lidocaine  Patch [Lidocaine ] Rash    Says that this was related to putting a heating pad on the area with the lidocaine  patch, has used lidocaine  patches in other areas without a reaction    Allergies as of 01/22/2024       Reactions   Carbamazepine  Other (See Comments)   HAS TO TAKE NAME BRAND ONLY TEGRETOL  OR A SEIZURE OCCURS   Other Other (See Comments)   NO FOODS WITH Seeds -- Reflux   Cat Dander Swelling, Other (See Comments)   Patient states that it causes her eyes to swell shut   Cefaclor Other (See Comments)   Unknown reaction   Codeine Nausea Only   Fish Allergy Nausea Only   Iodinated Contrast Media Other (See Comments)   Unknown reaction   Penicillins Itching   Percocet [oxycodone -acetaminophen ] Nausea And Vomiting   Red Dye #40 (allura Red) Other (See Comments)   Reflux   Senna-docusate Sodium  [sennosides-docusate Sodium ] Other (See Comments)   Severe stomach pains   Shellfish Protein-containing Drug Products Nausea Only   Shrimp (diagnostic) Nausea Only   Stomach pain   Tomato Other (See Comments)   Reflux   Hm Lidocaine  Patch [lidocaine ] Rash   Says that this was related to putting a heating pad on the area with the lidocaine  patch, has used lidocaine  patches in other areas without a reaction        Medication List        Accurate as of January 22, 2024  4:15 PM. If you have any questions, ask your nurse or doctor.          STOP taking these medications    Vitamin D  (Ergocalciferol ) 50000 units Caps Stopped by: Alta Shober X Demaurion Dicioccio       TAKE these medications    acetaminophen  650 MG CR tablet Commonly known as: TYLENOL  Take 650 mg by mouth daily at 6 (six) AM. Give 650 mg by mouth three times a day for pain   atorvastatin   10 MG tablet Commonly known as: LIPITOR TAKE 1/2 TABLET DAILY   Caltrate 600+D Plus Minerals 600-800 MG-UNIT Chew Chew 1 each  by mouth 2 (two) times daily.   clonazePAM  1 MG tablet Commonly known as: KLONOPIN  Take 1.5 tablets (1.5 mg total) by mouth at bedtime.   ENSURE MAX PROTEIN PO Take 1 Bottle by mouth See admin instructions. Drink 1 bottle by mouth once a day, alternating with one bottle of Ensure original formula every other day   Magnesium  Glycinate 100 MG Caps Take 100 mg by mouth daily. Give 100 mg by mouth one time a day for supplement   Melatonin 5 MG Caps Take 5 mg by mouth at bedtime. Give 2 tablet by mouth at bedtime for sleep   polyethylene glycol powder 17 GM/SCOOP powder Commonly known as: GLYCOLAX /MIRALAX  Take 17 g by mouth daily.   PreserVision AREDS 2 Caps Take 1 capsule by mouth in the morning and at bedtime.   QC Tumeric Complex 500 MG Caps Generic drug: Turmeric Take 500 mg by mouth daily.  Give 500 mg by mouth one time a day for supplement   Soothe XP Soln Place 1 drop into both eyes 3 (three) times daily.   TEGretol  200 MG tablet Generic drug: carbamazepine  TAKE 1 AND 1/2 TABLETS BY MOUTH AT BEDTIME        Review of Systems  Constitutional:  Negative for appetite change, fatigue and fever.  HENT:  Positive for hearing loss. Negative for congestion and voice change.   Eyes:  Negative for visual disturbance.  Respiratory:  Negative for shortness of breath.   Cardiovascular:  Negative for leg swelling.  Gastrointestinal:  Positive for constipation. Negative for abdominal pain, nausea and vomiting.  Genitourinary:  Positive for frequency. Negative for dysuria and urgency.       The patient stated she pushes her suprapubic region to help emptying her bladder  Musculoskeletal:  Positive for arthralgias and back pain. Negative for gait problem.       Left hip pain when walking, better.  Improved mid back pain  Skin:  Negative for color  change.       Left hip surgical incision is healed.   Neurological:  Positive for numbness. Negative for dizziness, seizures, facial asymmetry, speech difficulty and weakness.       Tingling/numbness left foot sometimes. Occasionally left hip/leg pain sometimes. Lightheaded when not sleeping well in lifetime.   Psychiatric/Behavioral:  Negative for behavioral problems and sleep disturbance. The patient is not nervous/anxious.        Feels lightheaded if not sleep well at night.    Immunization History  Administered Date(s) Administered   Fluad Quad(high Dose 65+) 12/03/2020, 12/17/2022   Fluad Trivalent(High Dose 65+) 12/17/2022   INFLUENZA, HIGH DOSE SEASONAL PF 11/26/2016, 12/01/2018, 11/30/2019, 11/21/2021   Influenza Whole 11/19/2017   Influenza-Unspecified 11/18/2014, 11/29/2015   Moderna Covid-19 Fall Seasonal Vaccine 64yrs & older 12/03/2022   Moderna Covid-19 Vaccine  Bivalent Booster 44yrs & up 07/05/2021   Moderna Sars-Covid-2 Vaccination 02/21/2019, 03/21/2019, 12/27/2019, 06/26/2020   Pfizer Covid-19 Vaccine Bivalent Booster 58yrs & up 11/06/2020   Pneumococcal Conjugate-13 09/24/2017   Pneumococcal Polysaccharide-23 12/30/2012, 09/18/2018   Respiratory Syncytial Virus Vaccine,Recomb Aduvanted(Arexvy) 02/01/2022, 02/06/2022   Tdap 08/20/2017   Zoster Recombinant(Shingrix) 07/21/2017, 12/08/2017   Zoster, Live 05/28/2007   Pertinent  Health Maintenance Due  Topic Date Due   Influenza Vaccine  09/18/2023   FOOT EXAM  11/06/2023   Mammogram  05/11/2024   HEMOGLOBIN A1C  05/11/2024   OPHTHALMOLOGY EXAM  06/28/2024   Bone Density Scan  Completed      12/11/2022   12:57  PM 01/02/2023   10:47 AM 01/09/2023   10:38 AM 02/05/2023    2:39 PM 04/30/2023    1:47 PM  Fall Risk  Falls in the past year? 0 0 0 0 1  Was there an injury with Fall? 0  0  0  0  1   Fall Risk Category Calculator 0 0 0 0 3  Patient at Risk for Falls Due to No Fall Risks No Fall Risks No Fall Risks   History of fall(s)  Fall risk Follow up Falls evaluation completed Falls evaluation completed Falls evaluation completed;Education provided;Falls prevention discussed  Falls evaluation completed     Data saved with a previous flowsheet row definition   Functional Status Survey:    Vitals:   01/22/24 1310  BP: 120/66  Pulse: 70  Resp: 16  Temp: 97.7 F (36.5 C)  SpO2: 97%  Weight: 108 lb 3.2 oz (49.1 kg)   Body mass index is 18.01 kg/m. Physical Exam Vitals and nursing note reviewed.  Constitutional:      Appearance: Normal appearance.  HENT:     Head: Normocephalic and atraumatic.     Mouth/Throat:     Mouth: Mucous membranes are moist.  Eyes:     Extraocular Movements: Extraocular movements intact.     Conjunctiva/sclera: Conjunctivae normal.     Pupils: Pupils are equal, round, and reactive to light.  Cardiovascular:     Rate and Rhythm: Normal rate and regular rhythm.     Heart sounds: No murmur heard. Pulmonary:     Effort: Pulmonary effort is normal.     Breath sounds: No rales.  Abdominal:     General: Bowel sounds are normal.     Palpations: Abdomen is soft.     Tenderness: There is no abdominal tenderness. There is no right CVA tenderness, guarding or rebound.  Musculoskeletal:        General: No tenderness.     Cervical back: Normal range of motion and neck supple.     Right lower leg: No edema.     Left lower leg: No edema.     Comments: S/p left hip ORIF.  Mid back tenderness is controlled  Skin:    General: Skin is warm and dry.     Comments: Left hip surgical scar   Neurological:     General: No focal deficit present.     Mental Status: She is alert and oriented to person, place, and time. Mental status is at baseline.     Gait: Gait abnormal.  Psychiatric:        Mood and Affect: Mood normal.        Behavior: Behavior normal.        Thought Content: Thought content normal.        Judgment: Judgment normal.     Comments: Stated she feels sad  during holidays, but no change of her participation in playing cards, dinning in dinning room with others, taking [facility] bus for shopping, singing programs, etc.      Labs reviewed: Recent Labs    04/23/23 1614 04/24/23 1613 09/12/23 1957  NA 140 141 134*  K 4.1 3.9 4.1  CL 105 104 99  CO2 25 27 28*  GLUCOSE 108* 118*  --   BUN 26* 28* 9  CREATININE 0.55 0.61 0.5  CALCIUM  9.6 9.3 8.8   Recent Labs    04/23/23 1614 09/12/23 1957  AST 29 20  ALT 25 17  ALKPHOS 39  --  BILITOT 0.5  --   PROT 6.6  --   ALBUMIN 4.3  --    Recent Labs    04/23/23 1614 04/24/23 1613 09/12/23 1957  WBC 9.1 8.1 6.6  NEUTROABS 7.3  --   --   HGB 13.8 13.2 12.2  HCT 41.6 41.2 37  MCV 101.5* 102.0*  --   PLT 177 172 206   Lab Results  Component Value Date   TSH 2.49 05/05/2023   Lab Results  Component Value Date   HGBA1C 5.6 11/12/2023   Lab Results  Component Value Date   CHOL 171 05/05/2023   HDL 61 05/05/2023   LDLCALC 90 05/05/2023   TRIG 104 05/05/2023   CHOLHDL 3.0 06/03/2022    Significant Diagnostic Results in last 30 days:  No results found.  Assessment/Plan  Constipation Has been stable for long-term until 2 days ago on MiraLax , Benefiber Encourage oral fluid intake, adding MiraLAX  1 time today, observe  Osteoarthritis s/p left hip surgery, s/p Kyphplasty, T6 fx 04/24/23, pain is controlled, taking Tylenol , off Norco.    Epilepsy without status epilepticus, not intractable ([name])  no active seizures since last seen, failed generic Tegretol  in the past, on Tegretol ,  Clonazepam  hs (GDR to stop on her own didn't cause active seizures, but flare up insomnia and anxiety). S/p Neurology   Hyperlipidemia takes Atorvastatin  5mg  qd, LDL 90 05/05/23  Pre-diabetes Hgb a1c 5. 6 9/25/25lifestyle modification ACR 5 9/24/246 11/12/23   Family/ staff Communication: Plan of care reviewed with the patient and charge nurse  Labs/tests ordered: None

## 2024-01-22 NOTE — Assessment & Plan Note (Signed)
 Hgb a1c 5. 6 9/25/25lifestyle modification ACR 5 9/24/246 11/12/23

## 2024-01-26 ENCOUNTER — Other Ambulatory Visit: Payer: Self-pay | Admitting: Nurse Practitioner

## 2024-01-26 DIAGNOSIS — F5101 Primary insomnia: Secondary | ICD-10-CM

## 2024-01-26 MED ORDER — CLONAZEPAM 1 MG PO TABS: 1.5000 mg | ORAL_TABLET | Freq: Every day | ORAL | 0 refills | Status: DC

## 2024-01-29 ENCOUNTER — Other Ambulatory Visit: Payer: Self-pay | Admitting: Nurse Practitioner

## 2024-01-29 DIAGNOSIS — F5101 Primary insomnia: Secondary | ICD-10-CM

## 2024-01-29 MED ORDER — CLONAZEPAM 1 MG PO TABS: 1.5000 mg | ORAL_TABLET | Freq: Every day | ORAL | 0 refills | Status: DC

## 2024-02-29 ENCOUNTER — Other Ambulatory Visit: Payer: Self-pay | Admitting: Nurse Practitioner

## 2024-02-29 DIAGNOSIS — F5101 Primary insomnia: Secondary | ICD-10-CM

## 2024-02-29 MED ORDER — CLONAZEPAM 1 MG PO TABS: 1.5000 mg | ORAL_TABLET | Freq: Every day | ORAL | 2 refills | Status: AC

## 2024-03-03 ENCOUNTER — Telehealth: Payer: Self-pay | Admitting: Nurse Practitioner

## 2024-03-03 NOTE — Telephone Encounter (Signed)
 Heather, please have Manuelita and the [facility] team review.  Thanks,  MM

## 2024-03-03 NOTE — Telephone Encounter (Signed)
 Please advise....  Copied from CRM [id number]. Topic: General - Billing Inquiry >> Feb 26, 2024  3:14 PM Marda MATSU wrote: Reason for CRM: Please look at 11/11/2023. Patient friend is calling regarding a [facility] diagnostic bill.  Patient is being billed for $149.27 ... For a lab that is done yearly and has had no out of pocket charge.   ICD-10 code may need to be changed to an acceptable code.    Please advise >> Mar 03, 2024 12:05 PM Chiquita SQUIBB wrote: Patients friend is calling in again requesting to speak to [facility] or the office manager, as it has been days and no one has returned her call. Please call Kate back today at [phone]. >> Feb 29, 2024  9:26 AM Darice BIRCH wrote: Information noted

## 2024-03-08 ENCOUNTER — Non-Acute Institutional Stay: Payer: Self-pay | Admitting: Nurse Practitioner

## 2024-03-08 ENCOUNTER — Encounter: Payer: Self-pay | Admitting: Nurse Practitioner

## 2024-03-08 DIAGNOSIS — K5904 Chronic idiopathic constipation: Secondary | ICD-10-CM | POA: Diagnosis not present

## 2024-03-08 DIAGNOSIS — E785 Hyperlipidemia, unspecified: Secondary | ICD-10-CM

## 2024-03-08 DIAGNOSIS — F5101 Primary insomnia: Secondary | ICD-10-CM

## 2024-03-08 DIAGNOSIS — G40909 Epilepsy, unspecified, not intractable, without status epilepticus: Secondary | ICD-10-CM | POA: Diagnosis not present

## 2024-03-08 DIAGNOSIS — S82001A Unspecified fracture of right patella, initial encounter for closed fracture: Secondary | ICD-10-CM | POA: Diagnosis not present

## 2024-03-08 DIAGNOSIS — R7303 Prediabetes: Secondary | ICD-10-CM | POA: Diagnosis not present

## 2024-03-08 DIAGNOSIS — M810 Age-related osteoporosis without current pathological fracture: Secondary | ICD-10-CM

## 2024-03-08 NOTE — Assessment & Plan Note (Signed)
 Insomnia/anxiety, takes Clonazepam , failed GDR,  Mg, Tumeric, failed Ambien , declined Effexor . TSH 2.49 05/05/23

## 2024-03-08 NOTE — Assessment & Plan Note (Addendum)
 the right knee pain, swelling, warmth sustained from a fall when slide off the bed landed on her right knee  03/07/2024 right knee x-ray series showed nondisplaced, oblique coronal fracture through the inferior half of the patella.  cold compress, Biofreeze, orthopedic referral

## 2024-03-08 NOTE — Assessment & Plan Note (Signed)
 takes Atorvastatin 5mg  qd, LDL 90 05/05/23

## 2024-03-08 NOTE — Assessment & Plan Note (Signed)
 Stable on MiraLax , Benefiber

## 2024-03-08 NOTE — Progress Notes (Signed)
 " Loca she did do fluid liquid OT tion:  [facility] Room Number: [id number] Place of Service:  ALF (13) Provider:  Bobbyjoe Pabst X, NP  Patient Care Team: Oviya Ammar X, NP as PCP - General (Internal Medicine) Benjaman Artman X, NP as Nurse Practitioner (Internal Medicine)  Extended Emergency Contact Information Primary Emergency Contact: [name],[name]  [place] Mobile Phone: [phone] Relation: Friend  Code Status:  DNR Goals of care: Advanced Directive information    03/08/2024    1:03 PM  Advanced Directives  Does Patient Have a Medical Advance Directive? Yes  Type of Estate Agent of [place];Living will;Out of facility DNR (pink MOST or yellow form)  Does patient want to make changes to medical advance directive? No - Patient declined  Copy of Healthcare Power of Attorney in Chart? Yes - validated most recent copy scanned in chart (See row information)  Pre-existing out of facility DNR order (yellow form or pink MOST form) Yellow form placed in chart (order not valid for inpatient use)     Chief Complaint  Patient presents with   Fall    fall, knee pain    HPI:  Pt is a 84 y.o. female seen today for an acute visit for right knee pain   The right knee pain, swelling, warmth sustained from a fall when slide off the bed landed on her right knee  03/07/2024 right knee x-ray series showed nondisplaced, oblique coronal fracture through the inferior half of the patella.   ED 04/24/23 T6 fx on CT T spine, L spine. Norco, Tylenol  for pain.  ED 04/23/23 for dizziness, fall, head injury/scal laceration, etiology was deemed to active seizure, followed by Neurology, CT head/cervical spine, EKG, labs unremarkable.              OA, s/p left hip surgery, s/p Kyphplasty, T6 fx 04/24/23, pain is controlled, taking Tylenol , off Norco.               Dizziness: recurrent when not sleeping well.               Post op anemia, Hgb 12.2 09/12/23              Seizures, no active seizures since last seen, failed generic Tegretol  in the past, on Tegretol ,  Clonazepam  hs (GDR to stop on her own didn't cause active seizures, but flare up insomnia and anxiety). S/p Neurology              Constipation, on MiraLax , Benefiber             Hyperlipidemia, takes Atorvastatin  5mg  qd, LDL 90 05/05/23             Prediabetic, Hgb a1c 5. 6 11/12/23, lifestyle modification ACR 5 11/12/23             OP takes Reclast  in the past,  t-score -3.6 09/13/19, -3.5 05/12/22, Osteoporosis clinic f/u             Insomnia/anxiety, takes Clonazepam , failed GDR,  Mg, Tumeric, failed Ambien , declined Effexor . TSH 2.49 05/05/23             Erosive gastropathy, off PPI    Past Medical History:  Diagnosis Date   Anxiety    Cataract    Chronic constipation    Depression    Osteoporosis    Seizures ([name])    epilepsy  last seizure 1977   Past Surgical History:  Procedure  Laterality Date   ABDOMINAL HYSTERECTOMY  1995   Dr. Fleeta Coup   CATARACT EXTRACTION Bilateral 12/2016   COLONOSCOPY  2012   patchy increased intraepithelial lymphocytes - draelos   ESOPHAGOGASTRODUODENOSCOPY     multiple   FISSURECTOMY     INTRAMEDULLARY (IM) NAIL INTERTROCHANTERIC Left 05/09/2021   Procedure: INTRAMEDULLARY (IM) NAIL INTERTROCHANTRIC;  Surgeon: Addie Cordella Hamilton, MD;  Location: [facility] OR;  Service: Orthopedics;  Laterality: Left;   IR KYPHO EA ADDL LEVEL THORACIC OR LUMBAR  06/26/2021   IR KYPHO THORACIC WITH BONE BIOPSY  06/26/2021   WRIST SURGERY Left    due to fracture    Allergies[1]  Outpatient Encounter Medications as of 03/08/2024  Medication Sig   acetaminophen  (TYLENOL ) 650 MG CR tablet Take 650 mg by mouth daily at 6 (six) AM. Give 650 mg by mouth three times a day for pain   Artificial Tear Solution (SOOTHE XP) SOLN Place 1 drop into both eyes 3 (three) times daily.   atorvastatin  (LIPITOR) 10 MG tablet TAKE 1/2 TABLET DAILY   Calcium  Carbonate-Vit D-Min (CALTRATE 600+D  PLUS MINERALS) 600-800 MG-UNIT CHEW Chew 1 each by mouth 2 (two) times daily.   clonazePAM  (KLONOPIN ) 1 MG tablet Take 1.5 tablets (1.5 mg total) by mouth at bedtime.   Magnesium  Glycinate 100 MG CAPS Take 100 mg by mouth daily. Give 100 mg by mouth one time a day for supplement   Melatonin 5 MG CAPS Take 5 mg by mouth at bedtime. Give 2 tablet by mouth at bedtime for sleep   Multiple Vitamins-Minerals (PRESERVISION AREDS 2) CAPS Take 1 capsule by mouth in the morning and at bedtime.   Nutritional Supplements (ENSURE MAX PROTEIN PO) Take 1 Bottle by mouth See admin instructions. Drink 1 bottle by mouth once a day, alternating with one bottle of Ensure original formula every other day   polyethylene glycol powder (GLYCOLAX /MIRALAX ) 17 GM/SCOOP powder Take 17 g by mouth daily.   TEGRETOL  200 MG tablet TAKE 1 AND 1/2 TABLETS BY MOUTH AT BEDTIME   Turmeric (QC TUMERIC COMPLEX) 500 MG CAPS Take 500 mg by mouth daily.  Give 500 mg by mouth one time a day for supplement   No facility-administered encounter medications on file as of 03/08/2024.    Review of Systems  Constitutional:  Negative for appetite change, fatigue and fever.  HENT:  Positive for hearing loss. Negative for congestion and voice change.   Eyes:  Negative for visual disturbance.  Respiratory:  Negative for shortness of breath.   Cardiovascular:  Negative for leg swelling.  Gastrointestinal:  Positive for constipation. Negative for abdominal pain, nausea and vomiting.  Genitourinary:  Positive for frequency. Negative for dysuria and urgency.       The patient stated she pushes her suprapubic region to help emptying her bladder  Musculoskeletal:  Positive for arthralgias, back pain and joint swelling. Negative for gait problem.       Left hip pain when walking, better.  Improved mid back pain R knee pain  Skin:  Negative for color change.       Left hip surgical incision is healed.   Neurological:  Positive for numbness.  Negative for dizziness, seizures, facial asymmetry, speech difficulty and weakness.       Tingling/numbness left foot sometimes. Occasionally left hip/leg pain sometimes. Lightheaded when not sleeping well in lifetime.   Psychiatric/Behavioral:  Negative for behavioral problems and sleep disturbance. The patient is not nervous/anxious.  Feels lightheaded if not sleep well at night.    Immunization History  Administered Date(s) Administered   Fluad Quad(high Dose 65+) 12/03/2020, 12/17/2022   Fluad Trivalent(High Dose 65+) 12/17/2022   INFLUENZA, HIGH DOSE SEASONAL PF 11/26/2016, 12/01/2018, 11/30/2019, 11/21/2021, 11/24/2023   Influenza Whole 11/19/2017   Influenza-Unspecified 11/18/2014, 11/29/2015   Moderna Covid-19 Fall Seasonal Vaccine 41yrs & older 12/03/2022   Moderna Covid-19 Vaccine  Bivalent Booster 51yrs & up 07/05/2021   Moderna Sars-Covid-2 Vaccination 02/21/2019, 03/21/2019, 12/27/2019, 06/26/2020   Pfizer Covid-19 Vaccine Bivalent Booster 82yrs & up 11/06/2020   Pneumococcal Conjugate-13 09/24/2017   Pneumococcal Polysaccharide-23 12/30/2012, 09/18/2018   RSV,unspecified 12/03/2023   Respiratory Syncytial Virus Vaccine,Recomb Aduvanted(Arexvy) 02/01/2022, 02/06/2022   Tdap 08/20/2017   Unspecified SARS-COV-2 Vaccination 12/14/2023   Zoster Recombinant(Shingrix) 07/21/2017, 12/08/2017   Zoster, Live 05/28/2007   Pertinent  Health Maintenance Due  Topic Date Due   FOOT EXAM  11/06/2023   Mammogram  05/11/2024   HEMOGLOBIN A1C  05/11/2024   OPHTHALMOLOGY EXAM  06/28/2024   Influenza Vaccine  Completed   Bone Density Scan  Completed      12/11/2022   12:57 PM 01/02/2023   10:47 AM 01/09/2023   10:38 AM 02/05/2023    2:39 PM 04/30/2023    1:47 PM  Fall Risk  Falls in the past year? 0 0 0 0 1  Was there an injury with Fall? 0  0  0  0  1   Fall Risk Category Calculator 0 0 0 0 3  Patient at Risk for Falls Due to No Fall Risks No Fall Risks No Fall Risks   History of fall(s)  Fall risk Follow up Falls evaluation completed Falls evaluation completed Falls evaluation completed;Education provided;Falls prevention discussed  Falls evaluation completed     Data saved with a previous flowsheet row definition   Functional Status Survey:    Vitals:   03/08/24 1254  BP: 116/75  Pulse: 79  Resp: 19  Temp: (!) 97.4 F (36.3 C)  SpO2: 97%  Weight: 109 lb (49.4 kg)  Height: 5' 5 (1.651 m)   Body mass index is 18.14 kg/m. Physical Exam Vitals and nursing note reviewed.  Constitutional:      Appearance: Normal appearance.  HENT:     Head: Normocephalic and atraumatic.     Mouth/Throat:     Mouth: Mucous membranes are moist.  Eyes:     Extraocular Movements: Extraocular movements intact.     Conjunctiva/sclera: Conjunctivae normal.     Pupils: Pupils are equal, round, and reactive to light.  Cardiovascular:     Rate and Rhythm: Normal rate and regular rhythm.     Heart sounds: No murmur heard. Pulmonary:     Effort: Pulmonary effort is normal.     Breath sounds: No rales.  Abdominal:     General: Bowel sounds are normal.     Palpations: Abdomen is soft.     Tenderness: There is no abdominal tenderness.  Musculoskeletal:        General: Swelling, tenderness and signs of injury present.     Cervical back: Normal range of motion and neck supple.     Right lower leg: Edema present.     Left lower leg: No edema.     Comments: S/p left hip ORIF.  Mid back tenderness is controlled The right knee swelling, warmth, painful when palpated or with range of motion over with weightbearing  Skin:    General: Skin is warm and dry.  Comments: Left hip surgical scar   Neurological:     General: No focal deficit present.     Mental Status: She is alert and oriented to person, place, and time. Mental status is at baseline.     Gait: Gait abnormal.  Psychiatric:        Mood and Affect: Mood normal.        Behavior: Behavior normal.         Thought Content: Thought content normal.     Comments: Stated she feels sad during holidays, but no change of her participation in playing cards, dinning in dinning room with others, taking [employer] bus for shopping, singing programs, etc.      Labs reviewed: Recent Labs    04/23/23 1614 04/24/23 1613 09/12/23 1957  NA 140 141 134*  K 4.1 3.9 4.1  CL 105 104 99  CO2 25 27 28*  GLUCOSE 108* 118*  --   BUN 26* 28* 9  CREATININE 0.55 0.61 0.5  CALCIUM  9.6 9.3 8.8   Recent Labs    04/23/23 1614 09/12/23 1957  AST 29 20  ALT 25 17  ALKPHOS 39  --   BILITOT 0.5  --   PROT 6.6  --   ALBUMIN 4.3  --    Recent Labs    04/23/23 1614 04/24/23 1613 09/12/23 1957  WBC 9.1 8.1 6.6  NEUTROABS 7.3  --   --   HGB 13.8 13.2 12.2  HCT 41.6 41.2 37  MCV 101.5* 102.0*  --   PLT 177 172 206   Lab Results  Component Value Date   TSH 2.49 05/05/2023   Lab Results  Component Value Date   HGBA1C 5.6 11/12/2023   Lab Results  Component Value Date   CHOL 171 05/05/2023   HDL 61 05/05/2023   LDLCALC 90 05/05/2023   TRIG 104 05/05/2023   CHOLHDL 3.0 06/03/2022    Significant Diagnostic Results in last 30 days:  No results found.  Assessment/Plan Right patella fracture the right knee pain, swelling, warmth sustained from a fall when slide off the bed landed on her right knee  03/07/2024 right knee x-ray series showed nondisplaced, oblique coronal fracture through the inferior half of the patella.  cold compress, Biofreeze, orthopedic referral    Epilepsy without status epilepticus, not intractable ([name]) no active seizures since last seen, failed generic Tegretol  in the past, on Tegretol ,  Clonazepam  hs (GDR to stop on her own didn't cause active seizures, but flare up insomnia and anxiety). S/p Neurology   Constipation Stable on MiraLax , Benefiber  Hyperlipidemia takes Atorvastatin  5mg  qd, LDL 90 05/05/23  Pre-diabetes Hgb a1c 5. 6 11/12/23, lifestyle modification ACR 5  11/12/23  Osteoporosis takes Reclast  in the past,  t-score -3.6 09/13/19, -3.5 05/12/22, Osteoporosis clinic f/u     Family/ staff Communication: Plan of care reviewed with the patient and charge nurse  Labs/tests ordered: X-ray right knee series done 03/07/2024     [1]  Allergies Allergen Reactions   Carbamazepine  Other (See Comments)    HAS TO TAKE NAME BRAND ONLY TEGRETOL  OR A SEIZURE OCCURS   Other Other (See Comments)    NO FOODS WITH Seeds -- Reflux   Cat Dander Swelling and Other (See Comments)    Patient states that it causes her eyes to swell shut   Cefaclor Other (See Comments)    Unknown reaction   Codeine Nausea Only   Fish Allergy Nausea Only   Iodinated Contrast Media Other (  See Comments)    Unknown reaction     Penicillins Itching   Percocet [Oxycodone -Acetaminophen ] Nausea And Vomiting   Red Dye #40 (Allura Red) Other (See Comments)    Reflux   Senna-Docusate Sodium  [Sennosides-Docusate Sodium ] Other (See Comments)    Severe stomach pains   Shellfish Protein-Containing Drug Products Nausea Only   Shrimp (Diagnostic) Nausea Only    Stomach pain    Tomato Other (See Comments)    Reflux   Hm Lidocaine  Patch [Lidocaine ] Rash    Says that this was related to putting a heating pad on the area with the lidocaine  patch, has used lidocaine  patches in other areas without a reaction   "

## 2024-03-08 NOTE — Assessment & Plan Note (Signed)
 Hgb a1c 5. 6 11/12/23, lifestyle modification ACR 5 11/12/23

## 2024-03-08 NOTE — Assessment & Plan Note (Signed)
 takes Reclast  in the past,  t-score -3.6 09/13/19, -3.5 05/12/22, Osteoporosis clinic f/u

## 2024-03-08 NOTE — Assessment & Plan Note (Signed)
 no active seizures since last seen, failed generic Tegretol  in the past, on Tegretol ,  Clonazepam  hs (GDR to stop on her own didn't cause active seizures, but flare up insomnia and anxiety). S/p Neurology

## 2024-03-16 ENCOUNTER — Telehealth: Payer: Self-pay | Admitting: Nurse Practitioner

## 2024-03-16 NOTE — Telephone Encounter (Signed)
 Copied from CRM [id number]. Topic: General - Billing Inquiry >> Feb 26, 2024  3:14 PM Marda MATSU wrote: Reason for CRM: Please look at 11/11/2023. Patient friend is calling regarding a [facility] diagnostic bill.  Patient is being billed for $149.27 ... For a lab that is done yearly and has had no out of pocket charge.   ICD-10 code may need to be changed to an acceptable code.    Please advise >> Mar 16, 2024  1:07 PM Farrel B wrote: Reason for CRM: Please look at 11/11/2023. Patient friend is calling regarding a [facility] diagnostic bill.  Patient is being billed for $149.27 ... For a lab that is done yearly and has had no out of pocket charge.  ICD-10 code may need to be changed to an acceptable code.  Please advise:  Note: this call in relations to previous calls that has been made advised the friend that NP Mast had placed a note to have [facility] team to review ICD 10 code for billing incorrectly please call Ms. Kate to advised her. I advised her on the call back times she stated that has never happen and she needs to know something. I advised her to please call [facility] to see if there's any updates as of the last communication NP Mast has sent in response.  >> Mar 03, 2024 12:12 PM Darice BIRCH wrote: Created E=TE and forwarded to [facility] >> Mar 03, 2024 12:05 PM Chiquita SQUIBB wrote: Patients friend is calling in again requesting to speak to [facility] or the office manager, as it has been days and no one has returned her call. Please call Kate back today at [phone]. >> Feb 29, 2024  9:26 AM Darice BIRCH wrote: Information noted

## 2024-03-16 NOTE — Telephone Encounter (Signed)
 I called [facility], spoke with [facility], provided another diagnosis code R73.03. Per Ethelene it will take 30-45 days for medicare to reprocess the claim.   I called Kate, shared the above, and she expressed her gratitude.

## 2024-03-16 NOTE — Telephone Encounter (Signed)
 Please advise .... Copied from CRM [id number]. Topic: General - Billing Inquiry >> Feb 26, 2024  3:14 PM Marda MATSU wrote: Reason for CRM: Please look at 11/11/2023. Patient friend is calling regarding a [facility] diagnostic bill.  Patient is being billed for $149.27 ... For a lab that is done yearly and has had no out of pocket charge.   ICD-10 code may need to be changed to an acceptable code.    Please advise >> Mar 16, 2024  1:07 PM Farrel B wrote: Reason for CRM: Please look at 11/11/2023. Patient friend is calling regarding a [facility] diagnostic bill.  Patient is being billed for $149.27 ... For a lab that is done yearly and has had no out of pocket charge.  ICD-10 code may need to be changed to an acceptable code.  Please advise:  Note: this call in relations to previous calls that has been made advised the friend that NP Mast had placed a note to have [facility] team to review ICD 10 code for billing incorrectly please call Ms. Kate to advised her. I advised her on the call back times she stated that has never happen and she needs to know something. I advised her to please call [facility] to see if there's any updates as of the last communication NP Mast has sent in response.  >> Mar 03, 2024 12:12 PM Darice BIRCH wrote: Created E=TE and forwarded to [facility] >> Mar 03, 2024 12:05 PM Chiquita SQUIBB wrote: Patients friend is calling in again requesting to speak to [facility] or the office manager, as it has been days and no one has returned her call. Please call Kate back today at [phone]. >> Feb 29, 2024  9:26 AM Darice BIRCH wrote: Information noted

## 2024-07-13 ENCOUNTER — Encounter (INDEPENDENT_AMBULATORY_CARE_PROVIDER_SITE_OTHER): Admitting: Ophthalmology
